# Patient Record
Sex: Female | Born: 1935 | Race: White | Hispanic: No | Marital: Married | Smoking: Never smoker
Health system: Southern US, Community
[De-identification: ages and names within clinical notes are randomized; demographics above are authoritative.]

## PROBLEM LIST (undated history)

## (undated) DIAGNOSIS — I4891 Unspecified atrial fibrillation: Secondary | ICD-10-CM

## (undated) DIAGNOSIS — J9611 Chronic respiratory failure with hypoxia: Secondary | ICD-10-CM

## (undated) DIAGNOSIS — J9612 Chronic respiratory failure with hypercapnia: Secondary | ICD-10-CM

## (undated) DIAGNOSIS — E039 Hypothyroidism, unspecified: Secondary | ICD-10-CM

## (undated) DIAGNOSIS — R059 Cough, unspecified: Secondary | ICD-10-CM

## (undated) DIAGNOSIS — Z8619 Personal history of other infectious and parasitic diseases: Secondary | ICD-10-CM

## (undated) DIAGNOSIS — A498 Other bacterial infections of unspecified site: Secondary | ICD-10-CM

## (undated) DIAGNOSIS — R05 Cough: Secondary | ICD-10-CM

## (undated) DIAGNOSIS — J449 Chronic obstructive pulmonary disease, unspecified: Secondary | ICD-10-CM

## (undated) DIAGNOSIS — K759 Inflammatory liver disease, unspecified: Secondary | ICD-10-CM

## (undated) DIAGNOSIS — IMO0001 Reserved for inherently not codable concepts without codable children: Secondary | ICD-10-CM

## (undated) DIAGNOSIS — I499 Cardiac arrhythmia, unspecified: Secondary | ICD-10-CM

## (undated) HISTORY — DX: Personal history of other infectious and parasitic diseases: Z86.19

## (undated) HISTORY — DX: Unspecified atrial fibrillation: I48.91

## (undated) HISTORY — DX: Other bacterial infections of unspecified site: A49.8

## (undated) HISTORY — PX: SHOULDER ARTHROSCOPY: SHX128

## (undated) HISTORY — PX: COLONOSCOPY: SHX174

## (undated) HISTORY — DX: Chronic obstructive pulmonary disease, unspecified: J44.9

## (undated) HISTORY — DX: Inflammatory liver disease, unspecified: K75.9

---

## 2005-08-02 ENCOUNTER — Encounter: Payer: Self-pay | Admitting: Pulmonary Disease

## 2007-09-11 ENCOUNTER — Encounter: Payer: Self-pay | Admitting: Pulmonary Disease

## 2008-12-07 ENCOUNTER — Encounter: Payer: Self-pay | Admitting: Pulmonary Disease

## 2008-12-24 ENCOUNTER — Encounter: Payer: Self-pay | Admitting: Pulmonary Disease

## 2010-09-02 ENCOUNTER — Encounter: Payer: Self-pay | Admitting: Cardiovascular Disease

## 2010-09-11 ENCOUNTER — Emergency Department: Payer: Self-pay | Admitting: Emergency Medicine

## 2010-09-15 DIAGNOSIS — I4891 Unspecified atrial fibrillation: Secondary | ICD-10-CM

## 2010-09-15 DIAGNOSIS — J42 Unspecified chronic bronchitis: Secondary | ICD-10-CM | POA: Insufficient documentation

## 2010-09-15 DIAGNOSIS — R5383 Other fatigue: Secondary | ICD-10-CM

## 2010-09-15 DIAGNOSIS — J479 Bronchiectasis, uncomplicated: Secondary | ICD-10-CM

## 2010-09-15 DIAGNOSIS — R5381 Other malaise: Secondary | ICD-10-CM

## 2010-09-16 ENCOUNTER — Ambulatory Visit: Payer: Self-pay | Admitting: Pulmonary Disease

## 2010-09-16 DIAGNOSIS — A310 Pulmonary mycobacterial infection: Secondary | ICD-10-CM

## 2010-09-23 ENCOUNTER — Ambulatory Visit: Payer: Self-pay | Admitting: Cardiovascular Disease

## 2010-09-28 ENCOUNTER — Telehealth (INDEPENDENT_AMBULATORY_CARE_PROVIDER_SITE_OTHER): Payer: Self-pay | Admitting: *Deleted

## 2010-10-07 ENCOUNTER — Ambulatory Visit: Payer: Self-pay | Admitting: Pulmonary Disease

## 2010-10-23 ENCOUNTER — Telehealth (INDEPENDENT_AMBULATORY_CARE_PROVIDER_SITE_OTHER): Payer: Self-pay | Admitting: *Deleted

## 2010-12-08 NOTE — Letter (Signed)
Summary: Medical Record Release  Medical Record Release   Imported By: Harlon Flor 09/30/2010 10:59:11  _____________________________________________________________________  External Attachment:    Type:   Image     Comment:   External Document

## 2010-12-08 NOTE — Assessment & Plan Note (Signed)
Summary: NP6/AMD   Visit Type:  Initial Consult Referring Genna Casimir:  Duncan Dull Primary Niklaus Mamaril:  Duncan Dull [facility])  CC:  Establish care with a local Cardiologist; she moved to [place] from [place] in May 2011.  She has A-Fib; has shortness of breath more than usual..  History of Present Illness: Hannah Kline is a very pleasant 77 year old woman with a remote history of Mycobacterium avium with residual lung disease, on inhalers with a history of atrial fibrillation who presents by referral from Dr.Tullo for management of atrial fibrillation.  She reports that she has had 3 episodes of atrial fibrillation. The first episode was 5 years ago when she had an upper respiratory infection/pneumonia requiring a long course of antibiotics. She had a second course of atrial fibrillation in a similar setting when she had an upper respiratory infection. She had a third zone in May of this year lasting for 2 hours. Since then, she had no further episodes.  Notes indicate that her heart rate has been elevated at baseline and she has been started on diltiazem CD 120 mg daily. She is continued on her digoxin, which she has been on for many years. She has not had any complications on the new channel blocker.  She is active, works out at [employer] frequently. She denies any significant shortness of breath, tachycardia palpitations or lightheadedness. She has never had a stress test or cardiac catheterization. She has had an echo with a cardiologist in [place].  EKG shows normal sinus rhythm with rate of 62 beats a minute, incomplete right bundle branch block otherwise no significant ST or T wave changes  Current Medications (verified): 1)  Advair Diskus 100-50 Mcg/dose Aepb (Fluticasone-Salmeterol) .Marland Kitchen.. 1 Puff Two Times A Day 2)  Spiriva Handihaler 18 Mcg Caps (Tiotropium Bromide Monohydrate) .... Once Daily 3)  Levothyroxine Sodium 75 Mcg Tabs (Levothyroxine Sodium) ....  Once Daily 4)  Digoxin 0.125 Mg Tabs (Digoxin) .... Once Daily 5)  Diltiazem Hcl 120 Mg Tabs (Diltiazem Hcl) .... Once Daily 6)  Betoptic-S 0.25 % Susp (Betaxolol Hcl) .... One Drop in Left Eye Once Daily 7)  Dorzolamide Hcl 2 % Soln (Dorzolamide Hcl) .... One Drop in The Left Eye Two Times A Day 8)  Xopenex Hfa 45 Mcg/act Aero (Levalbuterol Tartrate) .Marland Kitchen.. 1-2 Puffs Every 4-6 Hrs As Needed  Allergies (verified): 1)  ! * Avelox 2)  ! * Isoniazid  Past History:  Past Medical History: Last updated: 09/16/2010  ATRIAL FIBRILLATION (ICD-427.31)--paroxysmal H/o MAC C O P D  BRONCHIECTASIS (ICD-494.0) h/o INH hepatitis  Past Surgical History: Last updated: 09/15/2010 Right shoulder arthroscopy  Family History: Last updated: 09/16/2010 mother--intestinal blockage Mother--angina  Social History: Last updated: 09/16/2010 Patient never smoked.  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    Vital Signs:  Patient profile:   77 year old female Height:      68 inches Weight:      143 pounds BMI:     21.82 Pulse rate:   62 / minute BP sitting:   104 / 60  (left arm) Cuff size:   regular  Vitals Entered By: Bishop Dublin, CMA (September 23, 2010 2:33 PM)  Physical Exam  General:  Well developed,  well nourished, in no acute distress.Thin woman. Head:  normocephalic and atraumatic Neck:  Neck supple, no JVD. No masses, thyromegaly or abnormal cervical nodes. Lungs:  Clear bilaterally to auscultation and percussion. Heart:  Non-displaced PMI, chest non-tender; regular rate and rhythm, S1, S2 without murmurs, rubs or gallops. Carotid upstroke  normal, no bruit. Pedals normal pulses. No edema, no varicosities. Abdomen:  Bowel sounds positive; abdomen soft and non-tender without masses Msk:  Back normal, normal gait. Muscle strength and tone normal. Pulses:  pulses normal in all 4 extremities Extremities:  No clubbing or cyanosis. Neurologic:  Alert and oriented x 3. Skin:  Intact without lesions or rashes. Psych:  Normal affect.   Impression & Recommendations:  Problem # 1:  ATRIAL FIBRILLATION (ICD-427.31) history of paroxysmal atrial fibrillation. She does not need anticoagulation as her events are rare. thry Typically comes on with significant stress and did not seem to come on on a routine basis. She seems comfortable with her current medications including digoxin and diltiazem. She would like to keep them as they are. She could possibly hold her digoxin as she preferred to continue on this medication and he should not be any downside to doing so particularly at such a low dose.  If she has recurrent episodes of atrial fibrillation, we could change her to sotalol, or add a beta blocker to her calcium channel blocker.  Her updated medication list for this problem includes:    Digoxin 0.125 Mg Tabs (Digoxin) ..... Once daily  Problem # 2:  OTHER MALAISE AND FATIGUE (ICD-780.79) Her malaise and fatigue which is rare comes on at rest and typically she is able to exercise frequently. I do not think that this is cardiac in nature as she has a excellent exercise tolerance. I've encouraged her to continue her exercise. She has had significant stress over the past year with a recent move from [place].   I did indicate that we could perform a treadmill study though she did not seem to think this was particularly indicated. I think this would be unrevealing given her excellent exercise tolerance.

## 2010-12-08 NOTE — Assessment & Plan Note (Signed)
Summary: rov for review of pfts.   Primary Riko Lumsden/Referring Taniah Reinecke:  Duncan Dull [facility])  CC:  pt here to discuss pft and xray results. pt is never smoker.  History of Present Illness: The pt comes in today for f/u of her pfts.  She was found to have mild to moderate airflow obstruction, minimal restriction, and a normal dlco.  Compared to 2008 pfts from [facility] her airflows have improved.  I have reviewed the study with her in detail, and answered all of her questions.    Current Medications (verified): 1)  Advair Diskus 100-50 Mcg/dose Aepb (Fluticasone-Salmeterol) .Marland Kitchen.. 1 Puff Two Times A Day 2)  Spiriva Handihaler 18 Mcg Caps (Tiotropium Bromide Monohydrate) .... Once Daily 3)  Levothyroxine Sodium 75 Mcg Tabs (Levothyroxine Sodium) .... Once Daily 4)  Digoxin 0.125 Mg Tabs (Digoxin) .... Once Daily 5)  Diltiazem Hcl 120 Mg Tabs (Diltiazem Hcl) .... Once Daily 6)  Betoptic-S 0.25 % Susp (Betaxolol Hcl) .... One Drop in Left Eye Once Daily 7)  Dorzolamide Hcl 2 % Soln (Dorzolamide Hcl) .... One Drop in The Left Eye Two Times A Day 8)  Xopenex Hfa 45 Mcg/act Aero (Levalbuterol Tartrate) .Marland Kitchen.. 1-2 Puffs Every 4-6 Hrs As Needed 9)  Bystolic 5 Mg Tabs (Nebivolol Hcl) .... Once Daily As Needed  Allergies (verified): 1)  ! * Avelox 2)  ! * Isoniazid  Review of Systems       The patient complains of shortness of breath with activity, productive cough, irregular heartbeats, acid heartburn, indigestion, loss of appetite, nasal congestion/difficulty breathing through nose, and change in color of mucus.  The patient denies shortness of breath at rest, non-productive cough, coughing up blood, chest pain, weight change, abdominal pain, difficulty swallowing, sore throat, tooth/dental problems, headaches, sneezing, itching, ear ache, anxiety, depression, hand/feet swelling, joint stiffness or pain, rash, and fever.    Vital Signs:  Patient profile:   77 year old  female Height:      68 inches Weight:      144 pounds O2 Sat:      97 % on Room air Temp:     97.5 degrees F oral Pulse rate:   57 / minute BP sitting:   116 / 62  (left arm) Cuff size:   regular  Vitals Entered By: Carver Fila (October 07, 2010 10:08 AM)  O2 Flow:  Room air CC: pt here to discuss pft and xray results. pt is never smoker Comments meds and allergies updated Phone number updated Carver Fila  October 07, 2010 10:09 AM    Physical Exam  General:  thin female in nad Lungs:  pops, crackles, squeaks throughout no wheezing  Heart:  rrr Extremities:  no edema or cyanosis  Neurologic:  alert and oriented, moves all 4.   Impression & Recommendations:  Problem # 1:  C O P D (ICD-496) the pt has mild to moderate airflow obstruction on her pfts, and will need to stay on some type of bronchodilator regimen.  I suspect this is due to her underlying bronchiectasis, and usually responds to LABA/ICS inhaler regimen.  Will therefore increase her advair to the middle strength.  Spiriva is typically used in emphysema, and therefore would try her off the medication to simplify her treatment.  If she sees worsening, would certainly add back.  Again, I stressed the importance of conditioning.  Problem # 2:  BRONCHIECTASIS (ICD-494.0) Have reviewed the "red flags" with her again, and will culture sputum for bacteria  and afb if she has an acute exacerbation.  Medications Added to Medication List This Visit: 1)  Bystolic 5 Mg Tabs (Nebivolol hcl) .... Once daily as needed  Other Orders: Est. Patient Level III (03474)   Patient Instructions: 1)  trial of increased advair strength to 250/50  one in am and pm..rinse mouth well. 2)  stop spiriva for next 4 weeks as a trial 3)  followup with me in 6mos

## 2010-12-08 NOTE — Progress Notes (Signed)
  Phone Note Other Incoming   Request: Send information Summary of Call: Records received from [facility], 32 pages sent to Dr. Shelle Iron.

## 2010-12-08 NOTE — Miscellaneous (Signed)
Summary: Orders Update pft charges  Clinical Lists Changes  Orders: Added new Service order of Carbon Monoxide diffusing w/capacity (94720) - Signed Added new Service order of Lung Volumes (94240) - Signed Added new Service order of Spirometry (Pre & Post) (94060) - Signed 

## 2010-12-08 NOTE — Assessment & Plan Note (Signed)
Summary: consult for bronchiectasis, copd, h/o mac   Visit Type:  Follow-up Copy to:  Duncan Dull Primary Provider/Referring Provider:  Duncan Dull [facility])  CC:  pulmonary consult. pt c/o cough with yellow mucus and increased sob.  pt being eval for bronchectasis. Marland Kitchen  History of Present Illness: The pt is a 78y.o female who comes in to establish with a pulmonologist in the area.  She recently moved to  [facility] from [place], and carries the diagnosis of bronchiectasis with COPD.  She also has a h/o MAC.  The pt states that she was diagnosed with bronchiectasis approx. 10 yrs ago when she was found to have fibrotic changes and hyperinflation on cxr.  She was later diagnosed with MAC and treated with multi-drug therapy, but developed INH induced hepatitis.  Her treatment was continued with alternative drugs, including nebulized amikacin at one point.  She was treated for at least one year, and thinks she was in some type of study at [facility].  Interestingly, she denies having any constitutional symptoms or debilitating cough at that time.  She thinks her MAC was irradicated by culture, but does not know for sure.  She has been maintained on advair and spiriva for her COPD since 2006.  She has a flutter valve, but rarely uses it.  She has never tried a vibratory vest.  Since moving here from [place], she has not felt well, but thinks it is improving.  She is currently eating well, and is regaining the 10 pound weight loss since the move.  She does have a chronic cough with purulent mucus that occurs every afternoon.  Her mucus quantity is at her usual baseline currently, but she did just finish a course of levaquin for a "chest cold".  She has also seen some increase in DOE recently.  The pt has not had recent pfts or cxr recently.    Current Medications (verified): 1)  Advair Diskus 100-50 Mcg/dose Aepb (Fluticasone-Salmeterol) .Marland Kitchen.. 1 Puff Two Times A Day 2)  Spiriva Handihaler  18 Mcg Caps (Tiotropium Bromide Monohydrate) .... Once Daily 3)  Levothyroxine Sodium 75 Mcg Tabs (Levothyroxine Sodium) .... Once Daily 4)  Digoxin 0.125 Mg Tabs (Digoxin) .... Once Daily 5)  Diltiazem Hcl 120 Mg Tabs (Diltiazem Hcl) .... Once Daily 6)  Betoptic-S 0.25 % Susp (Betaxolol Hcl) .... One Drop in Left Eye Once Daily 7)  Dorzolamide Hcl 2 % Soln (Dorzolamide Hcl) .... One Drop in The Left Eye Two Times A Day 8)  Xopenex Hfa 45 Mcg/act Aero (Levalbuterol Tartrate) .Marland Kitchen.. 1-2 Puffs Every 4-6 Hrs As Needed  Allergies (verified): 1)  ! * Avelox 2)  ! * Isoniazid  Past History:  Past Medical History:  ATRIAL FIBRILLATION (ICD-427.31)--paroxysmal H/o MAC C O P D  BRONCHIECTASIS (ICD-494.0) h/o INH hepatitis  Past Surgical History: Reviewed history from 09/15/2010 and no changes required. Right shoulder arthroscopy  Family History: Reviewed history from 09/15/2010 and no changes required. mother--intestinal blockage Mother--angina  Social History: Reviewed history from 09/15/2010 and no changes required. Patient never smoked.  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 pt c/o cough with yellow mucus, increased sob.  pt being eval for bronchectasis.  Comments meds and allergies updated Phone number updated Carver Fila  September 16, 2010 2:41 PM    Physical Exam  General:  thin female in nad Eyes:  PERRLA and EOMI.   Nose:  patent without discharge or purulence Mouth:  no exudates or other lesions Neck:  no jvd, tmg, LN Lungs:  diffuse crackles and pops no wheezing Heart:  rrr, no  mrg Abdomen:  soft and nontender, bs+ Extremities:  no edema or cyanosis, pulses intact distally Neurologic:  alert and oriented, moves all 4.   Impression & Recommendations:  Problem # 1:  BRONCHIECTASIS (ICD-494.0) the pt has a history of bronchiectasis, and given her abnormal lung exam, I suspect it is quite extensive.  I have had a long discussion with her about the management of bronchiectasis, and have reviewed the "red flags" indicating the need for abx therapy.  I have also encouraged her to use the flutter valve more consistently, especially in the afternoons when she has more cough and congestion.  I have also asked her to call whenever she feels that she is developing an acute flare...Marland KitchenMarland Kitchenit will be key to culture her colonizing flora so that abx therapy can be tailored during infections.  Problem # 2:  C O P D (ICD-496) the pt has a h/o airflow obstruction by pfts per her history.  She is currently on an aggressive bronchodilator regimen, but I wonder if she would do better with a higher strength of ICS?  Will setup for pfts, and discuss a maintenance drug regimen next visit.  I have stressed to her the importance of regular exercise and conditioning.  She has been thru pulmonary rehab once, and may refer her again to the program at [facility].  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  Unclear whether she remains colonized.  She does not currently have failure to thrive symptoms or worsening pulmonary symptoms, and therefore will follow for now.  Will send culture for AFB if we do routine C +S in the future.    Medications Added to Medication List This Visit: 1)  Advair Diskus 100-50 Mcg/dose Aepb (Fluticasone-salmeterol) .Marland Kitchen.. 1 puff two times a day 2)  Diltiazem Hcl 120 Mg Tabs (Diltiazem hcl) .... Once daily 3)  Betoptic-s 0.25 % Susp (Betaxolol hcl) .... One drop in left eye once daily 4)   Dorzolamide Hcl 2 % Soln (Dorzolamide hcl) .... One drop in the left eye two times a day 5)  Xopenex Hfa 45 Mcg/act Aero (Levalbuterol tartrate) .Marland Kitchen.. 1-2 puffs every 4-6 hrs as needed  Other Orders: Consultation Level V (98119) Pulmonary Referral (Pulmonary) T-2 View CXR (71020TC)  Patient Instructions: 1)  will check cxr today, and schedule for breathing tests.  Will see you back to discuss. 2)  no change in current meds. 3)  consider flutter valve in the afternoons to help with mucus clearance.   Immunization History:  Influenza Immunization History:    Influenza:  historical (08/08/2010)  Pneumovax Immunization History:    Pneumovax:  historical (08/08/2010)   Appended Document: consult for bronchiectasis, copd, h/o mac received disk of chest ct from 2010.Marland Kitchen...severe bronchiectasis  involving RUL, LUL, and especially RML/lingula.  Also had scattered nodular densities bilat.

## 2010-12-10 NOTE — Letter (Signed)
SummaryVisual merchandiser Visit Note   [facility]   Imported By: Roderic Ovens 10/28/2010 11:34:13  _____________________________________________________________________  External Attachment:    Type:   Image     Comment:   External Document

## 2010-12-10 NOTE — Progress Notes (Signed)
Summary: rx request  Phone Note Call from Patient Call back at Home Phone [phone]   Caller: Patient Call For: [facility] Summary of Call: pt was given samples of advair that worked well. she requests rx be faxed to [facility] for 90 days supply Initial call taken by: Tivis Ringer, CNA,  October 23, 2010 9:58 AM  Follow-up for Phone Call        Per OV note from 10/07/10, pt was to trial of increased advair strength to 250/50  one in am and pm..rinse mouth well.  Spoke with pt.  States the advair 250 strenght is working well.  Requesting 3 mo supply - [facility].    Dr. Shelle Iron, pls advise if ok.  Thanks! Follow-up by: Gweneth Dimitri RN,  October 23, 2010 10:20 AM  Additional Follow-up for Phone Call Additional follow up Details #1::        ok to send in 250/50 strength Additional Follow-up by: Barbaraann Share MD,  October 23, 2010 5:25 PM    Additional Follow-up for Phone Call Additional follow up Details #2::    Spoke with pt and notified rx was sent to [facility] for advair 250/50. Follow-up by: Vernie Murders,  October 23, 2010 5:31 PM  New/Updated Medications: ADVAIR DISKUS 250-50 MCG/DOSE AEPB (FLUTICASONE-SALMETEROL) 1 puff two times a day, rinse mouth well Prescriptions: ADVAIR DISKUS 250-50 MCG/DOSE AEPB (FLUTICASONE-SALMETEROL) 1 puff two times a day, rinse mouth well  #3 x 3   Entered by:   Vernie Murders   Authorized by:   Barbaraann Share MD   Signed by:   Vernie Murders on 10/23/2010   Method used:   Faxed to ...       [facility] (mail-order)             , [place]         Ph: [phone]       Fax: [phone]   RxID:   [id number]

## 2011-02-01 ENCOUNTER — Encounter: Payer: Self-pay | Admitting: *Deleted

## 2011-02-01 ENCOUNTER — Ambulatory Visit (INDEPENDENT_AMBULATORY_CARE_PROVIDER_SITE_OTHER): Payer: Medicare Other | Admitting: *Deleted

## 2011-02-01 VITALS — BP 106/68 | HR 62 | Ht 68.0 in | Wt 145.8 lb

## 2011-02-01 DIAGNOSIS — I4891 Unspecified atrial fibrillation: Secondary | ICD-10-CM

## 2011-02-01 NOTE — Patient Instructions (Signed)
Continue current medications. Start Pradaxa 150mg  1 tablet in AM and PM. You were given samples today.  Follow up this Wed 02/03/11 @ 3:30pm with Dr. Mariah Milling. We will let you know if you will need to continue Pradaxa Continue Bystolic 5mg  two times a day until your appt. Call our office with any change in symptoms in the meantime.

## 2011-02-01 NOTE — Progress Notes (Signed)
Pt in today for EKG, she is currently in a-fib with HR 62. Pt has h/o paroxysmal a fib. She thinks this began last night, she has no other pertinent symptoms, c/o mild SOB but she has pulmonary dz as well. Dr. Mariah Milling reviewed EKG. Reviewed meds with pt, she is not currently anticoagulated. Pt has taken Bystolic 5mg  today only, she takes PRN for a fib and she states this usually converts her back to NSR. Pt has taken 2 today with no change.  Scheduled pt for f/u with Dr. Mariah Milling in 2 days.  Advised pt to continue Cardizem 120mg  qd, Digoxin .125 qd, and other medications and to continue Bystolic 5mg  bid. Gave pt samples of Pradaxa 150mg  and she will take bid until f/u appt with Dr. Mariah Milling.

## 2011-02-03 ENCOUNTER — Encounter: Payer: Self-pay | Admitting: Cardiovascular Disease

## 2011-02-03 ENCOUNTER — Ambulatory Visit (INDEPENDENT_AMBULATORY_CARE_PROVIDER_SITE_OTHER): Payer: Medicare Other | Admitting: Cardiovascular Disease

## 2011-02-03 DIAGNOSIS — I4891 Unspecified atrial fibrillation: Secondary | ICD-10-CM

## 2011-02-03 DIAGNOSIS — R5381 Other malaise: Secondary | ICD-10-CM

## 2011-02-03 DIAGNOSIS — J479 Bronchiectasis, uncomplicated: Secondary | ICD-10-CM

## 2011-02-03 MED ORDER — AMIODARONE HCL 400 MG PO TABS: 400.0000 mg | ORAL_TABLET | Freq: Two times a day (BID) | ORAL | Status: DC

## 2011-02-03 NOTE — Assessment & Plan Note (Signed)
She does report a chronic cough and mild sputum production. This has been ongoing for several months. I suggested she try a proton pump inhibitor in case this is chronic reflux.

## 2011-02-03 NOTE — Patient Instructions (Signed)
Rhythm shows atrial fibrillation today We will hold diltiazem for now, Start amiodarone 400 mg in the Am and PM Monitor your rhythm if possible. If you think you are in a regular rhythm on Friday, come in for EKG If still in atrial fib on Monday, come in for EKG and to discuss other options (Cardioversion?)

## 2011-02-03 NOTE — Progress Notes (Signed)
   Patient ID: Hannah Kline, female    DOB: 01-31-1932, 77 y.o.   MRN: [id number]  HPI Comments: Hannah Kline is a very pleasant 77 year old woman with a remote history of Mycobacterium avium with residual lung disease, on inhalers with a history of atrial fibrillation who presents for add on appointment with feelings of malaise, recurrence of her atrial fibrillation that started 2 days ago.  She had an EKG in the office and was noted to be in atrial fibrillation. She was started on pradaxa 150 mg b.i.d. At that time. This was a nurse visit. Today she reports that she feels mild malaise, phlegm and shortness of breath her heart rate is not elevated. It is irregular and she has taken extra beta blocker though this did not convert her back to regular rhythm.  She is active, works out at [employer] frequently.  Her last echocardiogram was approximately 5 years ago in [place].  EKG shows atrial fibrillation with ventricular rate 77 beats per minute, left axis deviation, incomplete right bundle branch block     Review of Systems  Constitutional: Negative.        Malaise  HENT: Negative.   Eyes: Negative.   Respiratory: Negative.   Cardiovascular: Negative.        Palpitations  Gastrointestinal: Negative.   Musculoskeletal: Negative.   Skin: Negative.   Neurological: Negative.   Hematological: Negative.   Psychiatric/Behavioral: Negative.   All other systems reviewed and are negative.    BP 118/70  Pulse 77  Ht 5\' 8"  (1.727 m)  Wt 145 lb 6.4 oz (65.953 kg)  BMI 22.11 kg/m2   Physical Exam  Nursing note and vitals reviewed. Constitutional: She is oriented to person, place, and time. She appears well-developed and well-nourished.  HENT:  Head: Normocephalic.  Nose: Nose normal.  Mouth/Throat: Oropharynx is clear and moist.  Eyes: Conjunctivae are normal. Pupils are equal, round, and reactive to light.  Neck: Normal range of motion. Neck supple. No JVD present.  Cardiovascular:  Normal rate, normal heart sounds and intact distal pulses.  An irregularly irregular rhythm present. Exam reveals no gallop and no friction rub.   No murmur heard. Pulmonary/Chest: Effort normal and breath sounds normal. No respiratory distress. She has no wheezes. She has no rales. She exhibits no tenderness.  Abdominal: Soft. Bowel sounds are normal. She exhibits no distension. There is no tenderness.  Musculoskeletal: Normal range of motion. She exhibits no edema and no tenderness.  Lymphadenopathy:    She has no cervical adenopathy.  Neurological: She is alert and oriented to person, place, and time. Coordination normal.  Skin: Skin is warm and dry. No rash noted. No erythema.  Psychiatric: She has a normal mood and affect. Her behavior is normal. Judgment and thought content normal.     Assessment and Plan

## 2011-02-03 NOTE — Assessment & Plan Note (Addendum)
She is in atrial fibrillation for the past 48 hours. She has been on anticoagulation for this time. We will start her on amiodarone 400 mg b.i.d. For several days in the hope that this will cardiovert her. If she does not convert to sinus rhythm in the next 3-5 days, we will discuss with her whether she would like cardioversion. She received back in the office in 5 days or earlier if she feels she has converted to sinus rhythm.  We will hold her diltiazem will restart amiodarone.

## 2011-02-08 ENCOUNTER — Ambulatory Visit (INDEPENDENT_AMBULATORY_CARE_PROVIDER_SITE_OTHER): Payer: Medicare Other | Admitting: *Deleted

## 2011-02-08 ENCOUNTER — Encounter: Payer: Self-pay | Admitting: *Deleted

## 2011-02-08 VITALS — BP 105/64 | HR 58 | Ht 69.0 in | Wt 140.0 lb

## 2011-02-08 DIAGNOSIS — I4891 Unspecified atrial fibrillation: Secondary | ICD-10-CM

## 2011-02-08 NOTE — Progress Notes (Signed)
Pt in today for EKG, she is back in NSR HR 58. Pt in afib Wednesday last week and was told to come in office if she feels she has converted after holding Diltiazem and starting Amiodarone 400mg  bid. Pt stopped the Amiodarone Saturday, she thinks this is when she converted. She has restarted her Diltiazem. Pt has no c/o at this time. She has a f/u with Dr. Mariah Milling 09/2011, pt will call our office with any changes in the meantime. Pt would like to know if she could possibly come off the Diltiazem in the future. In the past she was on Digoxin only and Dr. Darrick Huntsman started her on Dilt as well to hopefully work with the Digoxin to keep her out of a fib. Pt is ok if Dr. Mariah Milling recommends she continues both, she just wanted to know what he advised.

## 2011-02-11 NOTE — Progress Notes (Signed)
Attempted to contact pt, LMOM TCB.  

## 2011-02-11 NOTE — Progress Notes (Signed)
Pt notified [place] [place]

## 2011-03-30 ENCOUNTER — Encounter: Payer: Self-pay | Admitting: Pulmonary Disease

## 2011-04-08 ENCOUNTER — Encounter: Payer: Self-pay | Admitting: Pulmonary Disease

## 2011-04-08 ENCOUNTER — Ambulatory Visit (INDEPENDENT_AMBULATORY_CARE_PROVIDER_SITE_OTHER): Payer: Medicare Other | Admitting: Pulmonary Disease

## 2011-04-08 DIAGNOSIS — J479 Bronchiectasis, uncomplicated: Secondary | ICD-10-CM

## 2011-04-08 DIAGNOSIS — A310 Pulmonary mycobacterial infection: Secondary | ICD-10-CM

## 2011-04-08 DIAGNOSIS — J449 Chronic obstructive pulmonary disease, unspecified: Secondary | ICD-10-CM

## 2011-04-08 NOTE — Assessment & Plan Note (Signed)
The pt has not had any acute exacerbation since the last visit, and has minimal cough and mucus at this time.  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

## 2011-04-08 NOTE — Assessment & Plan Note (Signed)
The pt has done very well on advair alone, and has stopped spiriva.  I have asked her to continue with this, and to stay as active as possible.

## 2011-04-08 NOTE — Patient Instructions (Signed)
No change in inhaler medications Continue to stay as active as possible, and call if worsening pulmonary symptoms. followup with me in 6mos

## 2011-04-08 NOTE — Progress Notes (Signed)
  Subjective:    Patient ID: Hannah Kline, female    DOB: Jan 12, 1932, 77 y.o.   MRN: [id number]  HPI The pt comes in today for f/u of her known bronchiectasis with COPD.  She has done fairly well since last visit from a pulmonary standpoint, and denies recent acute exacerbation or worsening sob.  She has been able to maintain on the mid strength of advair without spiriva.  She has some cough and mucus at baseline, but no increase in quantity or change in character.  She does feel fatigue, but is no losing weight and her appetite is unchanged.   Review of Systems  Constitutional: Negative for fever and unexpected weight change.  HENT: Positive for congestion, rhinorrhea and postnasal drip. Negative for ear pain, nosebleeds, sore throat, sneezing, trouble swallowing, dental problem and sinus pressure.   Eyes: Negative for redness and itching.  Respiratory: Positive for cough and shortness of breath. Negative for chest tightness and wheezing.   Cardiovascular: Negative for palpitations and leg swelling.  Gastrointestinal: Negative for nausea and vomiting.  Genitourinary: Negative for dysuria.  Musculoskeletal: Negative for joint swelling.  Skin: Negative for rash.  Neurological: Negative for headaches.  Hematological: Bruises/bleeds easily.  Psychiatric/Behavioral: Negative for dysphoric mood. The patient is not nervous/anxious.        Objective:   Physical Exam Thin female in nad Chest with scattered crackles and rhonchi.  No wheezing Cor with rrr currently LE without edema, no cyanosis  Alert and oriented, moves all 4        Assessment & Plan:

## 2011-06-23 ENCOUNTER — Telehealth: Payer: Self-pay | Admitting: Pulmonary Disease

## 2011-06-23 DIAGNOSIS — J479 Bronchiectasis, uncomplicated: Secondary | ICD-10-CM

## 2011-06-23 DIAGNOSIS — A310 Pulmonary mycobacterial infection: Secondary | ICD-10-CM

## 2011-06-23 DIAGNOSIS — J449 Chronic obstructive pulmonary disease, unspecified: Secondary | ICD-10-CM

## 2011-06-23 NOTE — Telephone Encounter (Signed)
[email or web address].Carollyn Etcheverry, CMA  

## 2011-06-23 NOTE — Telephone Encounter (Signed)
Spoke with the patient and she states she has been having increased productive cough x 1 month with yellow phlegm, but over the last few days she states the phlegm has been pink. She states the last time this happened was when she was diagnosed with MAC and she wants to know can she come and leave a sputum culture to be tested. She denied any other symptoms except for fatigue and overall not feeling well. Please advise Hannah Kline, CMA

## 2011-06-23 NOTE — Telephone Encounter (Signed)
I advised pt of KC res, orders placed and specimen cup left at front desk for pt to pick up.

## 2011-06-23 NOTE — Telephone Encounter (Signed)
Ok for her to leave sputum  Send for routine c and s, afb smear and culture.

## 2011-06-25 ENCOUNTER — Other Ambulatory Visit: Payer: Medicare Other

## 2011-06-25 DIAGNOSIS — A310 Pulmonary mycobacterial infection: Secondary | ICD-10-CM

## 2011-06-25 DIAGNOSIS — J449 Chronic obstructive pulmonary disease, unspecified: Secondary | ICD-10-CM

## 2011-06-25 DIAGNOSIS — J479 Bronchiectasis, uncomplicated: Secondary | ICD-10-CM

## 2011-06-29 LAB — RESPIRATORY CULTURE OR RESPIRATORY AND SPUTUM CULTURE

## 2011-07-01 ENCOUNTER — Telehealth: Payer: Self-pay | Admitting: Pulmonary Disease

## 2011-07-01 NOTE — Telephone Encounter (Signed)
Spoke with pt. She states that nothing further needed. Has already discussed these results with [facility] and has no further questions.

## 2011-08-18 ENCOUNTER — Ambulatory Visit: Payer: Medicare Other | Admitting: Internal Medicine

## 2011-08-23 ENCOUNTER — Telehealth: Payer: Self-pay | Admitting: Pulmonary Disease

## 2011-08-23 NOTE — Telephone Encounter (Signed)
I spoke with pt and she states when she spoke with megan back in September she advised her that her sputum was not growing mac but it could take up to 6 weeks to get final results. Pt states it has been 6 weeks and is wanting these results. Pt states her symptoms are getting better but still having some cough. Please advise Dr. Shelle Iron, thanks  Carver Fila, CMA

## 2011-08-25 NOTE — Telephone Encounter (Signed)
Please let pt know that her culture was finalized and was neg. For MAC

## 2011-08-26 NOTE — Telephone Encounter (Signed)
lmtcb

## 2011-08-26 NOTE — Telephone Encounter (Signed)
Called and spoke with pt and she stated that she already knew that the culture was negative for MAC.  She stated that they told her this before.  She stated that this was a waste of time.  She stated that she still feels bad, weight loss, cough.  She has appt with KC on 11-30 and i explained to the pt if she feels she needs to be seen prior to her appt on 11-30 then we could change the appt. She stated that she did not want to change her appt that she will keep in on 11-30.  Explained to the pt that if she feels like she is getting worse to call and make an earlier appt.  Pt voiced her understanding.

## 2011-09-24 ENCOUNTER — Encounter: Payer: Self-pay | Admitting: Cardiovascular Disease

## 2011-10-04 ENCOUNTER — Ambulatory Visit (INDEPENDENT_AMBULATORY_CARE_PROVIDER_SITE_OTHER): Payer: Medicare Other | Admitting: Internal Medicine

## 2011-10-04 ENCOUNTER — Encounter: Payer: Self-pay | Admitting: Internal Medicine

## 2011-10-04 DIAGNOSIS — Z1211 Encounter for screening for malignant neoplasm of colon: Secondary | ICD-10-CM

## 2011-10-04 DIAGNOSIS — R0902 Hypoxemia: Secondary | ICD-10-CM

## 2011-10-04 DIAGNOSIS — B965 Pseudomonas (aeruginosa) (mallei) (pseudomallei) as the cause of diseases classified elsewhere: Secondary | ICD-10-CM

## 2011-10-04 DIAGNOSIS — A498 Other bacterial infections of unspecified site: Secondary | ICD-10-CM

## 2011-10-04 DIAGNOSIS — J449 Chronic obstructive pulmonary disease, unspecified: Secondary | ICD-10-CM

## 2011-10-04 DIAGNOSIS — I4891 Unspecified atrial fibrillation: Secondary | ICD-10-CM

## 2011-10-04 DIAGNOSIS — R5383 Other fatigue: Secondary | ICD-10-CM

## 2011-10-04 DIAGNOSIS — Z1239 Encounter for other screening for malignant neoplasm of breast: Secondary | ICD-10-CM

## 2011-10-04 DIAGNOSIS — R5381 Other malaise: Secondary | ICD-10-CM

## 2011-10-04 LAB — CBC WITH DIFFERENTIAL/PLATELET
Basophils Absolute: 0.1 10*3/uL (ref 0.0–0.1)
Basophils Relative: 0.5 % (ref 0.0–3.0)
Eosinophils Relative: 1.4 % (ref 0.0–5.0)
HCT: 39.1 % (ref 36.0–46.0)
Hemoglobin: 13.1 g/dL (ref 12.0–15.0)
Lymphocytes Relative: 12.3 % (ref 12.0–46.0)
Monocytes Relative: 7.8 % (ref 3.0–12.0)
Neutro Abs: 10.2 10*3/uL — ABNORMAL HIGH (ref 1.4–7.7)
RBC: 4.12 Mil/uL (ref 3.87–5.11)
RDW: 15.2 % — ABNORMAL HIGH (ref 11.5–14.6)
WBC: 13 10*3/uL — ABNORMAL HIGH (ref 4.5–10.5)

## 2011-10-04 MED ORDER — LEVOTHYROXINE SODIUM 75 MCG PO TABS: 75.0000 ug | ORAL_TABLET | Freq: Every day | ORAL | Status: DC

## 2011-10-04 NOTE — Progress Notes (Signed)
Subjective:    Patient ID: Hannah Kline, female    DOB: 19-Apr-1932, 77 y.o.   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 Shelle Iron, who presens with continued fatigue, exercise intolerance and weight loss.  She was last seen by me in April 2012 at which time serologies for thyroid disease were sent.  Ambulatory sats at that time were noted to drop to 92% in office and she was referred to Dr. Shelle Iron. Digoxin was also stopped, cardizem was tried at prior visit for elevated heart rate with ambulation but she did not tolerate it was given prn propranolol. She had  Sputum cultures done last month, which were negative for MAC but positive for Pseudomonas which was interpreted as colonization  and therefore not treated after phone discussion with patient (see Dr. Teddy Spike telephone notes, although today this discussion was not recalled spontaneously by patient but recalled once reviewed with patient.  She continuess to go to her exercise class several days per week and notes that she is panting through the class and unable to tolerate much activity at home afterward for the rest of the day due to feeling shaky. She coughs daily and has reported sputum that ranges from clear to pink to orangish/brown lately.  She denies fevers and chest pain and palpitations.  She returned last week from a trip to [place]  And noted that on guided walk she was monitored by the guide with apulse oximetry which could not pick up her fingertip saturations despite working on others in the group.  She does not recall having a six minute walk or full  PFTs in the past year. Her fatigue is limiting her daily activities and she also reports weight loss of 9 lbs due to decreased appetite.  She denies any changes in bowel habits.    Past Medical History  Diagnosis Date  . Atrial fibrillation   .  History of Mycobacterium avium complex infection     lung disease  . COPD (chronic obstructive pulmonary disease)    . Bronchiectasis   . Hepatitis     h/o INH   Current Outpatient Prescriptions on File Prior to Visit  Medication Sig Dispense Refill  . betaxolol (BETOPTIC-S) 0.25 % ophthalmic suspension 1 drop 2 (two) times daily.        . dorzolamide (TRUSOPT) 2 % ophthalmic solution Place 1 drop into the left eye 2 (two) times daily.        . Fluticasone-Salmeterol (ADVAIR DISKUS) 250-50 MCG/DOSE AEPB Inhale 1 puff into the lungs 2 (two) times daily.        Marland Kitchen levalbuterol (XOPENEX HFA) 45 MCG/ACT inhaler Inhale 2 puffs into the lungs every 4 (four) hours as needed.        . nebivolol (BYSTOLIC) 5 MG tablet Take 5 mg by mouth daily as needed.          Review of Systems  Constitutional: Positive for activity change, appetite change, fatigue and unexpected weight change. Negative for fever and chills.  HENT: Negative for hearing loss, ear pain, nosebleeds, congestion, sore throat, facial swelling, rhinorrhea, sneezing, mouth sores, trouble swallowing, neck pain, neck stiffness, voice change, postnasal drip, sinus pressure, tinnitus and ear discharge.   Eyes: Negative for pain, discharge, redness and visual disturbance.  Respiratory: Positive for shortness of breath. Negative for cough, chest tightness, wheezing and stridor.  Cardiovascular: Negative for chest pain, palpitations and leg swelling.  Musculoskeletal: Negative for myalgias and arthralgias.  Skin: Negative for color change and rash.  Neurological: Positive for weakness and light-headedness. Negative for dizziness and headaches.  Hematological: Negative for adenopathy.       Objective:   Physical Exam  Vitals reviewed. Constitutional: She is oriented to person, place, and time. Vital signs are normal. She appears well-developed and well-nourished.  HENT:  Mouth/Throat: Oropharynx is clear and moist.  Eyes: EOM are normal.  Pupils are equal, round, and reactive to light. No scleral icterus.  Neck: Normal range of motion. Neck supple. No JVD present. No thyromegaly present.  Cardiovascular: Normal rate, regular rhythm, normal heart sounds and intact distal pulses.   Pulmonary/Chest: Effort normal. She has rhonchi in the right upper field.  Abdominal: Soft. Bowel sounds are normal. She exhibits no mass. There is no tenderness.  Musculoskeletal: Normal range of motion. She exhibits no edema.  Lymphadenopathy:    She has no cervical adenopathy.  Neurological: She is alert and oriented to person, place, and time.  Skin: Skin is warm and dry.  Psychiatric: She has a normal mood and affect.          Assessment & Plan:

## 2011-10-04 NOTE — Patient Instructions (Signed)
i  recommend adding one daily bottle of Ensure, or Boost or your protein mix if it proves  a minimum of 300 calories to add to your daily meals in between lunch and dinner ,  You may increase to two daily.   Your exercise class may be causing your oxygen level to drop below 885 and this will make you feel exhausted.

## 2011-10-05 ENCOUNTER — Encounter: Payer: Self-pay | Admitting: Internal Medicine

## 2011-10-05 DIAGNOSIS — Z1239 Encounter for other screening for malignant neoplasm of breast: Secondary | ICD-10-CM | POA: Insufficient documentation

## 2011-10-05 DIAGNOSIS — A498 Other bacterial infections of unspecified site: Secondary | ICD-10-CM | POA: Insufficient documentation

## 2011-10-05 DIAGNOSIS — Z1211 Encounter for screening for malignant neoplasm of colon: Secondary | ICD-10-CM | POA: Insufficient documentation

## 2011-10-05 DIAGNOSIS — R5383 Other fatigue: Secondary | ICD-10-CM | POA: Insufficient documentation

## 2011-10-05 NOTE — Assessment & Plan Note (Addendum)
Will screen for anemiaand random cortisol level  today as this was not done in April, but I suspect the source of her fatigue is diminshed lung function and desaturations with activity.  She has an appt with Dr. Shelle Iron within the week and I have recommended that she have her 6 minute walk,  Full pFTS, and overnight sleep study done to assess indications for supplemental 02.  If these are normal I will send her back to [facility] for an stress myoview, as I do not think she could tolerate a treadmill test .

## 2011-10-05 NOTE — Assessment & Plan Note (Signed)
Breast exam was deferred as was her annal PE which was due now ,  Due to her very active concerns over continued weight loss and fatigue.  Will reschedule in one month.

## 2011-10-05 NOTE — Assessment & Plan Note (Addendum)
She has had no runs of A fib that she is aware of and rhythm was normal in office.  Continue prn propranolol since she did not tolerate cardizem.  If her pulmonoogy evaluation fails to provide a source for her fatigue and weight loss will schedule eval by Tinm gollan with Holter monitor and stress test.

## 2011-10-05 NOTE — Assessment & Plan Note (Signed)
Her sputum culture from last month was treated as colonization.  Since her symptoms have been present for over 6 mo,ths without improvement and she is seeing Dr. Shelle Iron in less than one week we discussed the option of  treating her for actual infection.  If she has a leukocytosis I will go aheada and treat; otherwise will defer to Dr. Shelle Iron.

## 2011-10-05 NOTE — Assessment & Plan Note (Signed)
She had a normal colonoscopy in 2007 and will compete annual FOBTS until 2017 starting now.

## 2011-10-06 ENCOUNTER — Telehealth: Payer: Self-pay | Admitting: Internal Medicine

## 2011-10-06 DIAGNOSIS — A498 Other bacterial infections of unspecified site: Secondary | ICD-10-CM

## 2011-10-06 MED ORDER — CIPROFLOXACIN HCL 750 MG PO TABS: 750.0000 mg | ORAL_TABLET | Freq: Two times a day (BID) | ORAL | Status: AC

## 2011-10-06 NOTE — Telephone Encounter (Signed)
Message copied by Duncan Dull on Wed Oct 06, 2011  5:26 PM ------      Message from: Almira      Created: Tue Oct 05, 2011  6:40 PM       We can check her ambulatory oximetry while here.  Regarding treatment with abx, we usually use 3 "red flags" to pull trigger on abx, understanding that we can never sterilize her secretions.  Fever, increased quantity of mucus above usual baseline, and worsening pulmonary symptoms.  If you think she meets any of these criteria, would treat with abx (prob cipro at 750mg ).              ----- Message -----         From: Duncan Dull, MD         Sent: 10/05/2011  12:52 PM           To: Barbaraann Share, MD            Mellody Dance,            I saw Ms. Mcgue yesterday , cc persistent fatigue,  Productive sputum.  See my note, but since her cbc did not show anemia but did have a leukocytosis do you agree with starting her on antibiotics for the pseudomonas you cultured from her sputum. In September? . She has an appt coming up with you and I think she is also desatting with activity as the cause for her fatigue.  Do you guys still do 6 minute walks ?

## 2011-10-06 NOTE — Telephone Encounter (Signed)
Dr. Shelle Iron and I discussed the pseudomonas in her sputum and the fact that since she is having increased sputum and worsening dyspnea, I have elected to go ahead and treat her as if the psedumonas is causing an infection. I will send update EPIC with a presciption for ciprofloxacin 750 mg twice daily for 2 weeks #28 .  Can you send to her local pharmacy?  Marland Kitchen

## 2011-10-07 ENCOUNTER — Ambulatory Visit: Payer: Medicare Other | Admitting: Cardiovascular Disease

## 2011-10-07 NOTE — Telephone Encounter (Signed)
Notified patient of the message, and Rx has been called in.

## 2011-10-08 ENCOUNTER — Ambulatory Visit: Payer: Medicare Other | Admitting: Pulmonary Disease

## 2011-10-11 ENCOUNTER — Encounter: Payer: Self-pay | Admitting: Cardiovascular Disease

## 2011-10-12 ENCOUNTER — Ambulatory Visit (INDEPENDENT_AMBULATORY_CARE_PROVIDER_SITE_OTHER): Payer: Medicare Other | Admitting: Cardiovascular Disease

## 2011-10-12 ENCOUNTER — Encounter: Payer: Self-pay | Admitting: Cardiovascular Disease

## 2011-10-12 VITALS — BP 100/60 | HR 68 | Ht 68.0 in | Wt 135.5 lb

## 2011-10-12 DIAGNOSIS — R5383 Other fatigue: Secondary | ICD-10-CM

## 2011-10-12 DIAGNOSIS — A498 Other bacterial infections of unspecified site: Secondary | ICD-10-CM

## 2011-10-12 DIAGNOSIS — B965 Pseudomonas (aeruginosa) (mallei) (pseudomallei) as the cause of diseases classified elsewhere: Secondary | ICD-10-CM

## 2011-10-12 DIAGNOSIS — R5381 Other malaise: Secondary | ICD-10-CM

## 2011-10-12 DIAGNOSIS — I4891 Unspecified atrial fibrillation: Secondary | ICD-10-CM

## 2011-10-12 NOTE — Assessment & Plan Note (Signed)
No recent episodes of atrial fibrillation. She does have amiodarone to take p.r.n. For paroxysmal atrial fibrillation. We have asked her to call us if she does have arrhythmia.

## 2011-10-12 NOTE — Progress Notes (Signed)
Patient ID: Hannah Kline, female    DOB: [birth year], 77 y.o.   MRN: [id number]  HPI Comments: Hannah Kline is a very pleasant 77 year old woman with a remote history of Mycobacterium avium with residual lung disease/Bronchiectasis, on inhalers, with a history of atrial fibrillation (Last episode at the end of March with conversion to normal sinus rhythm after amiodarone p.r.n.), who presents for Routine followup.  She reports that she has had no further atrial fibrillation since the end of March 2012.  She does have worsening shortness of breath since that time. She has malaise, has lost 10 pounds.  She has a sputum culture growing Pseudomonas from August 2012.  She was recently started on ciprofloxacin 750 mg b.i.d. For symptoms of cough and malaise. Pseudomonas is pan sensitive.  She has noticed hypoxia when working out with oxygen saturation sometimes to the low 80s. She is concerned about this though does not want oxygen. Her last echocardiogram was approximately 5 years ago in [place].  EKG shows Normal sinus rhythm with rate 69 beats per minute, incomplete right bundle branch block, no other significant ST or T wave changes   Outpatient Encounter Prescriptions as of 10/12/2011  Medication Sig Dispense Refill  . betaxolol (BETOPTIC-S) 0.25 % ophthalmic suspension 1 drop 2 (two) times daily.        . ciprofloxacin (CIPRO) 750 MG tablet Take 1 tablet (750 mg total) by mouth 2 (two) times daily.  28 tablet  0  . dorzolamide (TRUSOPT) 2 % ophthalmic solution Place 1 drop into the left eye 2 (two) times daily.        . Fluticasone-Salmeterol (ADVAIR DISKUS) 250-50 MCG/DOSE AEPB Inhale 1 puff into the lungs 2 (two) times daily.        Marland Kitchen levalbuterol (XOPENEX HFA) 45 MCG/ACT inhaler Inhale 2 puffs into the lungs every 4 (four) hours as needed.        Marland Kitchen levothyroxine (SYNTHROID, LEVOTHROID) 75 MCG tablet Take 1 tablet (75 mcg total) by mouth daily.  90 tablet  3  . nebivolol (BYSTOLIC) 5 MG tablet  Take 5 mg by mouth daily as needed.  (NOT TAKING)         Review of Systems  Constitutional: Positive for fatigue.       Malaise  HENT: Negative.   Eyes: Negative.   Respiratory: Positive for shortness of breath.   Cardiovascular: Negative.        Palpitations  Gastrointestinal: Negative.   Musculoskeletal: Negative.   Skin: Negative.   Neurological: Negative.   Hematological: Negative.   Psychiatric/Behavioral: Negative.   All other systems reviewed and are negative.    BP 100/60  Pulse 68  Ht 5\' 8"  (1.727 m)  Wt 135 lb 8 oz (61.462 kg)  BMI 20.60 kg/m2   Physical Exam  Nursing note and vitals reviewed. Constitutional: She is oriented to person, place, and time. She appears well-developed and well-nourished.  HENT:  Head: Normocephalic.  Nose: Nose normal.  Mouth/Throat: Oropharynx is clear and moist.  Eyes: Conjunctivae are normal. Pupils are equal, round, and reactive to light.  Neck: Normal range of motion. Neck supple. No JVD present.  Cardiovascular: Normal rate, S1 normal, S2 normal, normal heart sounds and intact distal pulses.  An irregularly irregular rhythm present. Exam reveals no gallop and no friction rub.   No murmur heard. Pulmonary/Chest: Effort normal. No respiratory distress. She has wheezes. She has no rales. She exhibits no tenderness.  Abdominal: Soft. Bowel sounds are normal. She exhibits  no distension. There is no tenderness.  Musculoskeletal: Normal range of motion. She exhibits no edema and no tenderness.  Lymphadenopathy:    She has no cervical adenopathy.  Neurological: She is alert and oriented to person, place, and time. Coordination normal.  Skin: Skin is warm and dry. No rash noted. No erythema.  Psychiatric: She has a normal mood and affect. Her behavior is normal. Judgment and thought content normal.         Assessment and Plan     Assessment and Plan

## 2011-10-12 NOTE — Assessment & Plan Note (Signed)
I suspect her fatigue is secondary to underlying Pseudomonas infection.

## 2011-10-12 NOTE — Assessment & Plan Note (Signed)
Recently started on Cipro for Pseudomonas. She has followup with pulmonary in 2 weeks.

## 2011-10-12 NOTE — Patient Instructions (Signed)
You are doing well. No medication changes were made.  Please call us if you have new issues that need to be addressed before your next appt.  The office will contact you for a follow up Appt. In 12 months  

## 2011-10-25 ENCOUNTER — Encounter: Payer: Self-pay | Admitting: Pulmonary Disease

## 2011-10-25 ENCOUNTER — Ambulatory Visit (INDEPENDENT_AMBULATORY_CARE_PROVIDER_SITE_OTHER): Payer: Medicare Other | Admitting: Pulmonary Disease

## 2011-10-25 DIAGNOSIS — J479 Bronchiectasis, uncomplicated: Secondary | ICD-10-CM

## 2011-10-25 DIAGNOSIS — J449 Chronic obstructive pulmonary disease, unspecified: Secondary | ICD-10-CM

## 2011-10-25 DIAGNOSIS — A310 Pulmonary mycobacterial infection: Secondary | ICD-10-CM

## 2011-10-25 MED ORDER — MOMETASONE FURO-FORMOTEROL FUM 100-5 MCG/ACT IN AERO: 2.0000 | INHALATION_SPRAY | Freq: Two times a day (BID) | RESPIRATORY_TRACT | Status: DC

## 2011-10-25 NOTE — Patient Instructions (Addendum)
Will try vibratory vest am and pm to see if helps Will try dulera 100/5  2 inhalations am and pm in the place of advair everyday for one month. Will schedule for walk as well as overnight check of your oxygen levels while sleeping.  Consider using oxygen during your exercise periods to help extend your workouts and make them more productive. Will check ct chest in light of your ongoing mucus issues to see if evidence to suggest this more due to MAC than bacteria. followup with me in 4mos if doing better, but call if having increased issues.

## 2011-10-25 NOTE — Progress Notes (Signed)
  Subjective:    Patient ID: Hannah Kline, female    DOB: 08/20/1932, 77 y.o.   MRN: [id number]  HPI The patient comes in today for followup of her known bronchiectasis with obstructive lung disease.  She has multiple issues today, including a recent change in her mucus where she began to cough up blood-tinged material, and was subsequently treated by her primary physician with a course of ciprofloxacin.  She saw some improvement, but not a lot change.  She denies increased quantity of mucus, or increasing chest congestion.  She does have increased dyspnea on exertion, and is finding it more difficult to participate in her exercise program.  She has been monitoring her oxygen saturations with a pulse oximeter during this time, and states that she desaturates to 83%.  She has been staying compliant with her Advair.  The patient has also had a slow weight loss since the last visit, but denies other constitutional symptoms.  She has not had any fever, chills, or sweats.   Review of Systems  Constitutional: Positive for diaphoresis. Negative for fever and unexpected weight change.  HENT: Positive for congestion, rhinorrhea and postnasal drip. Negative for ear pain, nosebleeds, sore throat, sneezing, trouble swallowing, dental problem and sinus pressure.   Eyes: Negative for redness and itching.  Respiratory: Positive for cough and shortness of breath. Negative for chest tightness and wheezing.   Cardiovascular: Negative for palpitations and leg swelling.  Gastrointestinal: Negative for nausea and vomiting.  Genitourinary: Negative for dysuria.  Musculoskeletal: Negative for joint swelling.  Skin: Negative for rash.  Neurological: Negative for headaches.  Hematological: Bruises/bleeds easily.  Psychiatric/Behavioral: Negative for dysphoric mood. The patient is not nervous/anxious.        Objective:   Physical Exam Thin female in no acute distress  nose without purulence or discharge noted Chest  with diffuse pops and squeaks, no true wheezing Cardiac exam with regular rate and rhythm Lower extremities without edema, no cyanosis noted Alert and oriented, moves all 4 extremities.       Assessment & Plan:

## 2011-10-26 ENCOUNTER — Ambulatory Visit (INDEPENDENT_AMBULATORY_CARE_PROVIDER_SITE_OTHER): Payer: Medicare Other | Admitting: Pulmonary Disease

## 2011-10-26 DIAGNOSIS — R0609 Other forms of dyspnea: Secondary | ICD-10-CM

## 2011-10-26 DIAGNOSIS — R0600 Dyspnea, unspecified: Secondary | ICD-10-CM

## 2011-10-26 NOTE — Assessment & Plan Note (Addendum)
The patient has known mild to moderate airflow obstruction secondary to her bronchiectasis.  She has been on Advair, but has noticed increased shortness of breath just recently, and it is unclear how much of this is related to her exertional desaturations versus air trapping.  We'll try her on dulera instead of the Advair, since this has a smaller particles size and some patients see an improvement with increased deposition.  I have also had a long conversation with her about exertional oxygen.  I have encouraged her to wear oxygen whenever she is doing her exercise program or activities that require moderate or greater exertion.  However, the patient does not want to do this.  I will check a 6 minute walk to document her level of desaturation, and also overnight oximetry since this can involve longer periods of desaturation.  My only other thought is whether there may be a cardiac issue here that may be contributing to her sob??

## 2011-10-26 NOTE — Assessment & Plan Note (Signed)
The patient has a history of MAC and has been treated in the past, but her recent culture in August of this year was negative.  With her persistent blood-tinged mucus, I continued to worry about the presence of MAC.  I would like to repeat her CT chest to see if there is evidence for MAC such as cavities or abscesses.

## 2011-10-26 NOTE — Assessment & Plan Note (Signed)
The patient has known bronchiectasis, and is colonized with Pseudomonas.  She is currently bringing up discolored mucus with blood tinge, and has been treated recently with a course of ciprofloxacin.  She denies fever, increased quantity of mucus over baseline, nor has she had worsening chest congestion.  I have explained to her again that it is near impossible to sterilize her respiratory secretions given her bronchiectasis, and that attempts to do so usually result in resistant organisms.  However, I would treat her aggressively for true acute exacerbations and increased pulmonary symptoms.  The patient will typically work on muco- ciliary clearance in the mornings, and then does well for the rest of the day.  The key for her will be pulmonary hygiene, and I have recommended that she try a vibratory vest.  We'll continue to monitor her closely, and at some point she may require nebulized antibiotics or rotating antibiotics.

## 2011-10-28 ENCOUNTER — Ambulatory Visit (INDEPENDENT_AMBULATORY_CARE_PROVIDER_SITE_OTHER)
Admission: RE | Admit: 2011-10-28 | Discharge: 2011-10-28 | Disposition: A | Discharge status: Home / Self Care | Payer: Medicare Other | Source: Ambulatory Visit | Attending: Pulmonary Disease | Admitting: Pulmonary Disease

## 2011-10-28 DIAGNOSIS — A310 Pulmonary mycobacterial infection: Secondary | ICD-10-CM

## 2011-10-28 DIAGNOSIS — J479 Bronchiectasis, uncomplicated: Secondary | ICD-10-CM

## 2011-10-28 DIAGNOSIS — J449 Chronic obstructive pulmonary disease, unspecified: Secondary | ICD-10-CM

## 2011-10-29 ENCOUNTER — Encounter: Payer: Self-pay | Admitting: Pulmonary Disease

## 2011-11-15 ENCOUNTER — Telehealth: Payer: Self-pay | Admitting: Pulmonary Disease

## 2011-11-15 ENCOUNTER — Encounter: Payer: Self-pay | Admitting: Pulmonary Disease

## 2011-11-15 NOTE — Telephone Encounter (Signed)
Megan, please let pt know that her ONO shows low sat of 85%, but she only spent 3 min the entire night less than 88%.  Therefore, does not need oxygen while sleeping.

## 2011-11-15 NOTE — Telephone Encounter (Signed)
Called and spoke with pt.  Pt aware of ONO results.

## 2011-11-17 ENCOUNTER — Encounter: Payer: Self-pay | Admitting: Pulmonary Disease

## 2011-11-21 ENCOUNTER — Emergency Department: Payer: Self-pay | Admitting: Emergency Medicine

## 2011-11-21 DIAGNOSIS — J479 Bronchiectasis, uncomplicated: Secondary | ICD-10-CM | POA: Diagnosis not present

## 2011-11-21 DIAGNOSIS — R002 Palpitations: Secondary | ICD-10-CM | POA: Diagnosis not present

## 2011-11-21 DIAGNOSIS — I4891 Unspecified atrial fibrillation: Secondary | ICD-10-CM | POA: Diagnosis not present

## 2011-11-21 LAB — COMPREHENSIVE METABOLIC PANEL
Albumin: 3.4 g/dL (ref 3.4–5.0)
Anion Gap: 10 (ref 7–16)
BUN: 13 mg/dL (ref 7–18)
Bilirubin,Total: 0.6 mg/dL (ref 0.2–1.0)
Chloride: 105 mmol/L (ref 98–107)
EGFR (African American): >60
Glucose: 60 mg/dL — ABNORMAL LOW (ref 65–99)
Osmolality: 283 (ref 275–301)
Potassium: 3.6 mmol/L (ref 3.5–5.1)
SGOT(AST): 22 U/L (ref 15–37)
Sodium: 143 mmol/L (ref 136–145)
Total Protein: 7.3 g/dL (ref 6.4–8.2)

## 2011-11-21 LAB — URINALYSIS, COMPLETE
Bacteria: NONE SEEN
Bilirubin,UR: NEGATIVE
Blood: NEGATIVE
Ketone: NEGATIVE
Nitrite: NEGATIVE
Ph: 6 (ref 4.5–8.0)
RBC,UR: 1 /HPF (ref 0–5)
Specific Gravity: 1.006 (ref 1.003–1.030)
Squamous Epithelial: 1
WBC UR: 1 /HPF (ref 0–5)

## 2011-11-21 LAB — CBC
HGB: 13.4 g/dL (ref 12.0–16.0)
MCV: 94 fL (ref 80–100)
Platelet: 244 10*3/uL (ref 150–440)
RBC: 4.35 10*6/uL (ref 3.80–5.20)
RDW: 14.4 % (ref 11.5–14.5)
WBC: 11.9 10*3/uL — ABNORMAL HIGH (ref 3.6–11.0)

## 2011-11-21 LAB — DIGOXIN LEVEL: Digoxin: 0.64 ng/mL

## 2011-11-21 LAB — CK TOTAL AND CKMB (NOT AT ARMC)
CK, Total: 50 U/L (ref 21–215)
CK-MB: 0.7 ng/mL (ref 0.5–3.6)

## 2011-11-21 LAB — MAGNESIUM: Magnesium: 1.7 mg/dL — ABNORMAL LOW

## 2011-11-21 LAB — TSH: Thyroid Stimulating Horm: 3.09 u[IU]/mL

## 2011-11-21 LAB — TROPONIN I: Troponin-I: <0.02 ng/mL

## 2011-11-22 ENCOUNTER — Telehealth: Payer: Self-pay | Admitting: Cardiovascular Disease

## 2011-11-22 ENCOUNTER — Encounter: Payer: Self-pay | Admitting: Cardiovascular Disease

## 2011-11-22 ENCOUNTER — Ambulatory Visit (INDEPENDENT_AMBULATORY_CARE_PROVIDER_SITE_OTHER): Payer: Medicare Other | Admitting: Cardiovascular Disease

## 2011-11-22 VITALS — BP 90/60 | HR 98 | Ht 68.0 in | Wt 136.0 lb

## 2011-11-22 DIAGNOSIS — I4891 Unspecified atrial fibrillation: Secondary | ICD-10-CM

## 2011-11-22 DIAGNOSIS — J479 Bronchiectasis, uncomplicated: Secondary | ICD-10-CM

## 2011-11-22 NOTE — Progress Notes (Signed)
Patient ID: Hannah Kline, female    DOB: [birth year], 78 y.o.   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  She does report that she feels she converted to atrial fibrillation yesterday morning. She did have a stressful day 2 days ago, cooking and cleaning in her house for company. She wonders if she may have overdone it. She felt a tachycardia with irregularity starting yesterday morning. She went to the emergency room and was given doxycycline for possible upper respiratory infection, as was magnesium and oxygen. She started amiodarone 400 mg with pradaxa yesterday evening. She took anticoagulation again this morning.   Blood pressure is low today. She did take a bystolic 5 mg yesterday morning with a digoxin 0.125 mg.  Rare episodes of atrial fibrillation, the last episode was at the end of March 2012.  She has a sputum culture growing Pseudomonas from August 2012.  She did take a course of ciprofloxacin for 2 weeks at this time and felt better though has started to feel worse with sputum production again.  Periods of hypoxia in the past with working out. She does not want oxygen. Her last echocardiogram was approximately 5 years ago in [place].  EKG shows Atrial fibrillation with rate 98 beats per minute, right bundle branch block, no significant ST or T wave changes, left axis deviation   Outpatient Encounter Prescriptions as of 10/12/2011  Medication Sig Dispense Refill  . betaxolol (BETOPTIC-S) 0.25 % ophthalmic suspension 1 drop 2 (two) times daily.        . ciprofloxacin (CIPRO) 750 MG tablet Take 1 tablet (750 mg total) by mouth 2 (two) times daily.  28 tablet  0  .  dorzolamide (TRUSOPT) 2 % ophthalmic solution Place 1 drop into the left eye 2 (two) times daily.        . Fluticasone-Salmeterol (ADVAIR DISKUS) 250-50 MCG/DOSE AEPB Inhale 1 puff into the lungs 2 (two) times daily.        Marland Kitchen levalbuterol (XOPENEX HFA) 45 MCG/ACT inhaler Inhale 2 puffs into the lungs every 4 (four) hours as needed.        Marland Kitchen levothyroxine (SYNTHROID, LEVOTHROID) 75 MCG tablet Take 1 tablet (75 mcg total) by mouth daily.  90 tablet  3   Amiodarone  PRN for atrial fibrillation    . nebivolol (BYSTOLIC) 5 MG tablet Take 5 mg by mouth daily as needed.  (NOT TAKING)         Review of Systems  Constitutional:       Malaise  HENT: Negative.   Eyes: Negative.   Respiratory: Positive for shortness of breath.        Sputum production with cough  Cardiovascular: Positive for palpitations.       Palpitations  Gastrointestinal: Negative.   Musculoskeletal: Negative.   Skin: Negative.   Neurological: Negative.   Hematological: Negative.   Psychiatric/Behavioral: Negative.   All other systems reviewed and are negative.    BP 90/60  Pulse 98  Ht 5\' 8"  (1.727 m)  Wt 136 lb (61.689 kg)  BMI 20.68 kg/m2   Physical Exam  Nursing note and vitals reviewed. Constitutional: She is oriented to person, place, and time. She appears well-developed and well-nourished.  HENT:  Head: Normocephalic.  Nose: Nose normal.  Mouth/Throat: Oropharynx is clear and moist.  Eyes: Conjunctivae are normal. Pupils are equal, round, and reactive to light.  Neck: Normal range of motion. Neck supple. No JVD present.  Cardiovascular: Normal rate, S1 normal, S2 normal, normal heart sounds and intact distal pulses.  An irregularly irregular rhythm present. Exam reveals no gallop and no friction rub.   No murmur heard. Pulmonary/Chest: Effort normal. No respiratory distress. She has no rales. She exhibits no tenderness.  Abdominal: Soft. Bowel sounds are normal. She exhibits no distension. There is no  tenderness.  Musculoskeletal: Normal range of motion. She exhibits no edema and no tenderness.  Lymphadenopathy:    She has no cervical adenopathy.  Neurological: She is alert and oriented to person, place, and time. Coordination normal.  Skin: Skin is warm and dry. No rash noted. No erythema.  Psychiatric: She has a normal mood and affect. Her behavior is normal. Judgment and thought content normal.         Assessment and Plan     Assessment and Plan

## 2011-11-22 NOTE — Patient Instructions (Addendum)
Please continue amiodarone 400 mg twice a day until you convert (take for 4 days) Starting Saturday, decrease amio to 200 mg twice a day  Please continue digoxin (0.25 mg today) then 0.125 daily Continue pradaxa 150 mg twice a day Take bystolic only if systolic blood pressure greater than 110  Come in for an EKG on Monday morning  Please call us if you have new issues that need to be addressed before your next appt.  Your physician wants you to follow-up in: 1 to 2  week  You will receive a reminder letter in the mail two months in advance. If you don't receive a letter, please call our office to schedule the follow-up appointment.

## 2011-11-22 NOTE — Telephone Encounter (Signed)
Pt went [facility] with AFIB. Pt is still in AFIB this morning. They gave her a magnesium drip and has taken all the meds that she is suppose to and still not working.

## 2011-11-22 NOTE — Assessment & Plan Note (Signed)
We will try amiodarone through this week 400 mg b.i.d. With a check of her EKG in several days' time. We'll decrease the amiodarone to 200 mg b.i.d. If she has not converted to normal sinus rhythm by this weekend. We will have close followup with her next week. Given her low blood pressure, we are unable to use beta blockers and calcium channel blockers. We have suggested she continue on her digoxin 0.125 mg daily for rate control.  If she does not convert to normal sinus rhythm, we could consider cardioversion.  If she continues to have frequent episodes of atrial fibrillation, we could consider alternate antiarrhythmic given her underlying lung condition and the potential toxicity from amiodarone.

## 2011-11-22 NOTE — Assessment & Plan Note (Signed)
She reports that she felt well on the ciprofloxacin and 2012 and is wondering whether a long course of suppressive therapy with Cipro might make her feel better. She does have recurrence of her cough with sputum production.

## 2011-11-22 NOTE — Telephone Encounter (Signed)
Spoke to pt, she went to ER yesterday for new episode of Afib w/ RVR. C/o this AM of continued Afib, HR currently ranging from 52-111 at home. S/S are weakness and mild nausea. In ER, was given no meds for a-fib (per pt); only given Mag gtt for low result; and was only sent home with Rx for doxycycline for possible PNA per CXR, pt has not taken. She normally takes Bystolic 5mg  only and Digoxin 0.125mg  PRN and Amiodarone 200mg  PRN and Pradaxa PRN. Yesterday she took 1 digoxin with no changes. Last night AND this AM she took Amiodarone 200mg  x 2 and Pradaxa 150mg . HR still as high as 111. (Pt last ov 10/12/11 with TG, EKG NSR with RBBB, HR 69, h/o afib with conversion with Amio. Had not had an episode since 01/2011. Pt was instructed to call with any new Afib occurences.) I have advised pt to come in today at 1:45 for visit with Dr. Mariah Milling.

## 2011-11-29 ENCOUNTER — Encounter: Payer: Medicare Other | Admitting: *Deleted

## 2011-12-06 ENCOUNTER — Telehealth: Payer: Self-pay | Admitting: Pulmonary Disease

## 2011-12-06 ENCOUNTER — Encounter: Payer: Self-pay | Admitting: Cardiovascular Disease

## 2011-12-06 ENCOUNTER — Ambulatory Visit: Payer: Medicare Other | Admitting: Cardiovascular Disease

## 2011-12-06 MED ORDER — FLUTICASONE-SALMETEROL 250-50 MCG/DOSE IN AEPB: 1.0000 | INHALATION_SPRAY | Freq: Two times a day (BID) | RESPIRATORY_TRACT | Status: DC

## 2011-12-06 NOTE — Telephone Encounter (Signed)
Called spoke with patient, advised of KC's recs.  Pt verbalized her understanding but is still concerned that her mucus is pink, red and orange and feels this is related to infection.  Pt denies any changes in breathing.  Pt stated that she felt better after finishing the cipro.  Pt requesting any recs from [facility].  Also, pt stated that she would prefer changing back to the advair - changed in med list.  *pt okay with call-back tomorrow.

## 2011-12-06 NOTE — Telephone Encounter (Signed)
lmtcb x1 

## 2011-12-06 NOTE — Telephone Encounter (Signed)
Pt returned triage's call & stated she should be home for the rest of the day.  Hannah Kline

## 2011-12-06 NOTE — Telephone Encounter (Signed)
Spoke with and pt and she states has 3 questions for KC 1- since she is using the vest now to help loosen her mucus, would now be a good time to take abx "to go ahead and fight the bacteria" 2- currently using vest twice daily for 30 min each time. Can this be lessened to 20 min at a time?  3- since taking dulera, she has not noticed any changes in her symptoms. Can she go back to advair which is what she was taking before?  Please advise thanks!

## 2011-12-06 NOTE — Telephone Encounter (Signed)
Ok to decrease vest time to Ok to go back to advair, but it does have a higher incidence of cough than the other inhalers.  If she want to switch, ok with me.  Antibiotics are not needed just because you are mobilizing mucus.  She is colonized with bacteria that is always there, and her secretions can never be sterilized.  Attempts to do so will only result in multidrug resistant organisms.

## 2011-12-07 MED ORDER — CIPROFLOXACIN HCL 500 MG PO TABS: 500.0000 mg | ORAL_TABLET | Freq: Two times a day (BID) | ORAL | Status: AC

## 2011-12-07 NOTE — Telephone Encounter (Signed)
Let pt know that I think the downside of chronic antibiotics outweigh the benefits at this point.  Just because her mucus is discolored, doesn't mean that it is infected.  It probably is COLONIZATION with bugs.  Pt's with bronchiectasis often cough up mucus that is purulent everyday!!!  That said, if she feels strongly that she wishes to try antibiotics, could treat with cipro the first 7 days of every month.  She would need to accept the risk of developing multidrug resistant organisms that may not respond to traditional antibiotics in the future.  It is her decision.  If she wishes to:  cipro 500mg  bid first 7 days of each month.  #14, 6 fills.

## 2011-12-07 NOTE — Telephone Encounter (Signed)
Patient aware of KC recs but would like to try the Cipro for several months and see if this helps. She will call if she has any further questions or concerns.

## 2011-12-08 ENCOUNTER — Encounter: Payer: Self-pay | Admitting: Cardiovascular Disease

## 2011-12-08 ENCOUNTER — Telehealth: Payer: Self-pay | Admitting: *Deleted

## 2011-12-08 ENCOUNTER — Emergency Department: Payer: Self-pay | Admitting: Emergency Medicine

## 2011-12-08 DIAGNOSIS — R079 Chest pain, unspecified: Secondary | ICD-10-CM | POA: Diagnosis not present

## 2011-12-08 DIAGNOSIS — Z79899 Other long term (current) drug therapy: Secondary | ICD-10-CM | POA: Diagnosis not present

## 2011-12-08 DIAGNOSIS — I4891 Unspecified atrial fibrillation: Secondary | ICD-10-CM | POA: Diagnosis not present

## 2011-12-08 DIAGNOSIS — R52 Pain, unspecified: Secondary | ICD-10-CM | POA: Diagnosis not present

## 2011-12-08 DIAGNOSIS — J449 Chronic obstructive pulmonary disease, unspecified: Secondary | ICD-10-CM | POA: Diagnosis not present

## 2011-12-08 DIAGNOSIS — R6889 Other general symptoms and signs: Secondary | ICD-10-CM | POA: Diagnosis not present

## 2011-12-08 LAB — TROPONIN I: Troponin-I: <0.02 ng/mL

## 2011-12-08 LAB — CBC
HCT: 41 % (ref 35.0–47.0)
HGB: 13.6 g/dL (ref 12.0–16.0)
MCV: 94 fL (ref 80–100)
RDW: 14.1 % (ref 11.5–14.5)
WBC: 13.5 10*3/uL — ABNORMAL HIGH (ref 3.6–11.0)

## 2011-12-08 LAB — COMPREHENSIVE METABOLIC PANEL
Alkaline Phosphatase: 78 U/L (ref 50–136)
Anion Gap: 8 (ref 7–16)
BUN: 18 mg/dL (ref 7–18)
Calcium, Total: 9 mg/dL (ref 8.5–10.1)
Chloride: 102 mmol/L (ref 98–107)
Co2: 30 mmol/L (ref 21–32)
Creatinine: 0.68 mg/dL (ref 0.60–1.30)
EGFR (African American): >60
EGFR (Non-African Amer.): >60
Osmolality: 280 (ref 275–301)
Potassium: 4.2 mmol/L (ref 3.5–5.1)
SGOT(AST): 31 U/L (ref 15–37)

## 2011-12-08 LAB — DIGOXIN LEVEL: Digoxin: 0.8 ng/mL

## 2011-12-08 NOTE — Telephone Encounter (Signed)
Pt called stating she had EMS at her house now, EKG shows a fib with HR of 120. Pt cancelled her appt this past week that was scheduled 12/06/11, and is now stating that EMS says if Dr. Mariah Milling could evaluate her in clinic she would not need to go to ER.  Spoke to Dr. Mariah Milling, he advised her to take extra Amiodarone and Digoxin as he instructed per last note, otherwise if pt is symptomatic to go to ER.  I discussed this w/ pt, she confirms she has already taking total of 400mg  Amio recently and Digoxin, she feels "shaky" and unstable. I told pt to have EMS take her to ER as she has taken advised meds and still symptomatic.

## 2011-12-09 ENCOUNTER — Telehealth: Payer: Self-pay | Admitting: Pulmonary Disease

## 2011-12-09 ENCOUNTER — Telehealth: Payer: Self-pay | Admitting: Internal Medicine

## 2011-12-09 NOTE — Telephone Encounter (Signed)
Pt has COPD, has been seeing Dr. Cora Daniels in [place].  I explained to pt that he and Dr. Kendrick Fries are in the same practice, she still wants to see him.

## 2011-12-09 NOTE — Telephone Encounter (Signed)
Called and spoke with pt. Pt aware of KC's response and recs.  Pt verbalized understanding and denied any further questions.

## 2011-12-09 NOTE — Telephone Encounter (Signed)
The vest will not cause "pleurisy" unless she has a broken rib. The vest should not cause afib, but may possibly aggrevate it, though I think unlikely.   If she feels strongly about it, can hold treatment for a week and see how she does.

## 2011-12-09 NOTE — Telephone Encounter (Signed)
Pt would like to get referral to see Dr Kendrick Fries here in [place] for 2nd opinion [phone]

## 2011-12-09 NOTE — Telephone Encounter (Signed)
Called and spoke with pt.  Pt states she went to the ER yesterday d/t A. Fib and was also having L sided chest pain.  Pt states she was dx with pleurisy.  Pt states since starting the VEST, she has had 2 episodes of a fib and 1 episode of pleurisy.  She is wanting to know if the VEST could be causing either of these.  Please advise.  Thanks.

## 2011-12-09 NOTE — Telephone Encounter (Signed)
Ok with me.  Dr. Henrene Pastor is much more convenient for her, in [place]

## 2011-12-10 ENCOUNTER — Telehealth: Payer: Self-pay | Admitting: *Deleted

## 2011-12-10 ENCOUNTER — Telehealth: Payer: Self-pay | Admitting: Pulmonary Disease

## 2011-12-10 MED ORDER — FLECAINIDE ACETATE 50 MG PO TABS: 50.0000 mg | ORAL_TABLET | Freq: Two times a day (BID) | ORAL | Status: DC

## 2011-12-10 NOTE — Telephone Encounter (Signed)
Pt states she wants to stay on the advair and would like for [place] to do the PA.

## 2011-12-10 NOTE — Telephone Encounter (Signed)
Per Dr. Mariah Milling, notified pt to start Flecainide 50mg  BID and f/u in 1 week. Pt will start this evening and has f/u 2/5.

## 2011-12-10 NOTE — Telephone Encounter (Signed)
Pt can stay with advair and we can do prior auth, or she can switch to symbicort 160/4.5  2 bid.

## 2011-12-10 NOTE — Telephone Encounter (Signed)
Error.Hannah Kline ° °

## 2011-12-10 NOTE — Telephone Encounter (Signed)
Member ID # [id number]. Form received stating that Hannah Kline is not covered by pt's insurance. When researching what the pt has tried and failed in the past, it looks like the pt is back on Advair. I told the rep this and she said that the Advair will also require a PA. Their preferred alternative is Symbicort. Pls advise. Allergies  Allergen Reactions  . Isoniazid     unknown  . Moxifloxacin     Avelox  REACTION: chills, fainting

## 2011-12-13 ENCOUNTER — Other Ambulatory Visit: Payer: Self-pay | Admitting: Pulmonary Disease

## 2011-12-13 MED ORDER — FLUTICASONE-SALMETEROL 250-50 MCG/DOSE IN AEPB: 1.0000 | INHALATION_SPRAY | Freq: Two times a day (BID) | RESPIRATORY_TRACT | Status: DC

## 2011-12-13 NOTE — Telephone Encounter (Signed)
Per rep at [facility], Advair IS covered under the pt's particular plan and she is not sure why we were told it was not covered. Pt is aware Advair is covered and she should not have any problems filling this at her pharmacy. She will call if there are any problems or questions.

## 2011-12-14 ENCOUNTER — Encounter: Payer: Self-pay | Admitting: Cardiovascular Disease

## 2011-12-14 ENCOUNTER — Ambulatory Visit (INDEPENDENT_AMBULATORY_CARE_PROVIDER_SITE_OTHER): Payer: Medicare Other | Admitting: Cardiovascular Disease

## 2011-12-14 ENCOUNTER — Encounter: Payer: Self-pay | Admitting: Internal Medicine

## 2011-12-14 VITALS — BP 119/75 | HR 65 | Ht 68.0 in | Wt 133.0 lb

## 2011-12-14 DIAGNOSIS — I4891 Unspecified atrial fibrillation: Secondary | ICD-10-CM

## 2011-12-14 DIAGNOSIS — J479 Bronchiectasis, uncomplicated: Secondary | ICD-10-CM

## 2011-12-14 NOTE — Telephone Encounter (Signed)
You can go ahead and contact the [place] office to schedule with Dr. Kendrick Fries, tell them that patient says [place] is more conveient.

## 2011-12-14 NOTE — Telephone Encounter (Signed)
Does she need referral for this or can i just schedule

## 2011-12-14 NOTE — Telephone Encounter (Signed)
A-ok with me 

## 2011-12-14 NOTE — Progress Notes (Signed)
Patient ID: Hannah Kline, female    DOB: 07-01-1932, 78 y.o.   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  She continues to have episodes of atrial fibrillation, 2 episodes in the last month. One on January 13 lasting for several days, the second episode on January 30, with extra to the emergency room. We have been reluctant to start amiodarone on a regular basis given her underlying lung disease. She has taken this p.r.n. With limited success. Otherwise she feels okay. She continues to use her flutter with fast and is on ciprofloxacin for  brief Periods every month She has had problems with hypotension in the past. She does have diltiazem and propranolol to take as needed for arrhythmia.  She had a CT scan in the ER for left-sided chest pain that showed diffuse, extensive pulmonary findings including multifocal nodular opacities and chronic cystic changes, enlarged right hilar lymph node  Periods of hypoxia in the past with working out. She does not want oxygen. Her last echocardiogram was approximately 5 years ago in [place].  EKG shows Normal sinus rhythm with rate 62 beats per minute with incomplete right bundle branch block, left anterior fascicular block, T-wave abnormality in one and aVL    Outpatient Encounter Prescriptions as of 12/14/2011  Medication Sig Dispense Refill  . ADVAIR DISKUS 250-50 MCG/DOSE AEPB USE ONE INHALATION TWO TIMES A DAY RINSE MOUTH WELL  60 each  4  . ciprofloxacin (CIPRO) 500 MG tablet Take 1 tablet (500 mg total) by mouth 2 (two) times daily.  14 tablet  5  . dorzolamide-timolol (COSOPT) 22.3-6.8 MG/ML ophthalmic solution       . latanoprost  (XALATAN) 0.005 % ophthalmic solution Place 1 drop into the left eye At bedtime.      . levalbuterol (XOPENEX HFA) 45 MCG/ACT inhaler Inhale 2 puffs into the lungs every 4 (four) hours as needed.        Marland Kitchen levothyroxine (SYNTHROID, LEVOTHROID) 75 MCG tablet Take 1 tablet (75 mcg total) by mouth daily.  90 tablet  3  . nebivolol (BYSTOLIC) 5 MG tablet Take 5 mg by mouth daily as needed.        Marland Kitchen DISCONTD: dorzolamide (TRUSOPT) 2 % ophthalmic solution Place 1 drop into the left eye 2 (two) times daily.        Marland Kitchen DISCONTD: Fluticasone-Salmeterol (ADVAIR DISKUS) 250-50 MCG/DOSE AEPB Inhale 1 puff into the lungs 2 (two) times daily.  180 each  1     Review of Systems  Constitutional: Negative.   HENT: Negative.   Eyes: Negative.   Respiratory: Positive for cough and shortness of breath.   Cardiovascular: Negative.   Gastrointestinal: Negative.   Musculoskeletal: Negative.   Skin: Negative.   Neurological: Negative.   Hematological: Negative.   Psychiatric/Behavioral: Negative.   All other systems reviewed and are negative.     BP 119/75  Pulse 65  Ht 5\' 8"  (1.727 m)  Wt 133 lb (60.328 kg)  BMI 20.22 kg/m2  Physical Exam  Nursing note and vitals reviewed. Constitutional: She is oriented to person, place, and time. She appears well-developed and well-nourished.  HENT:  Head: Normocephalic.  Nose: Nose normal.  Mouth/Throat: Oropharynx is clear and moist.  Eyes: Conjunctivae are normal. Pupils are equal, round, and reactive to light.  Neck: Normal range of motion. Neck supple. No JVD present.  Cardiovascular: Normal rate, regular rhythm, S1 normal, S2 normal, normal heart sounds and intact distal pulses.  Exam reveals no gallop and no friction rub.   No murmur heard. Pulmonary/Chest: Effort normal and breath sounds normal. No respiratory distress. She has no wheezes. She has no rales. She exhibits no tenderness.  Abdominal: Soft. Bowel sounds are normal. She exhibits no distension.  There is no tenderness.  Musculoskeletal: Normal range of motion. She exhibits no edema and no tenderness.  Lymphadenopathy:    She has no cervical adenopathy.  Neurological: She is alert and oriented to person, place, and time. Coordination normal.  Skin: Skin is warm and dry. No rash noted. No erythema.  Psychiatric: She has a normal mood and affect. Her behavior is normal. Judgment and thought content normal.         Assessment and Plan

## 2011-12-14 NOTE — Assessment & Plan Note (Signed)
For her atrial fibrillation, given to breakthrough episodes in the past 2 weeks, we will start flecainide 50 mg b.i.d.. If she continues to have episodes, we could either increase the dose of flecainide or start digoxin 0.125 mg daily.

## 2011-12-14 NOTE — Telephone Encounter (Signed)
Sent to Robin to schedule.

## 2011-12-14 NOTE — Patient Instructions (Signed)
  Please start flecainide 50 mg twice a day For breakthrough atrial fibrillation, take flecainide x 2 Ok to take diltiazem or propranolol as needed for breathrough atrial fibrillation.  Please call us if you have new issues that need to be addressed before your next appt.  Your physician wants you to follow-up in: 3 months.  You will receive a reminder letter in the mail two months in advance. If you don't receive a letter, please call our office to schedule the follow-up appointment.

## 2011-12-14 NOTE — Assessment & Plan Note (Signed)
Continued problems with her lungs are likely contributing to cardiac arrhythmia. She is using a flutter vest and periodic antibiotics

## 2011-12-14 NOTE — Telephone Encounter (Signed)
Dr. Shelle Iron, is this ok for the pt to switch to Dr. Kendrick Fries? Thanks!

## 2011-12-15 NOTE — Telephone Encounter (Signed)
I spoke with pt and is scheduled to see BQ on 2/25 at 4:30. Pt aware address and to arrive 15 min early to fill out paperwork.

## 2011-12-20 DIAGNOSIS — H4010X0 Unspecified open-angle glaucoma, stage unspecified: Secondary | ICD-10-CM | POA: Diagnosis not present

## 2011-12-21 ENCOUNTER — Telehealth: Payer: Self-pay | Admitting: Pulmonary Disease

## 2011-12-21 MED ORDER — FLUTICASONE-SALMETEROL 250-50 MCG/DOSE IN AEPB: INHALATION_SPRAY | RESPIRATORY_TRACT | Status: DC

## 2011-12-21 NOTE — Telephone Encounter (Signed)
lmomtcb x1 for pt  I have called and fixed rx into to [facility]

## 2011-12-21 NOTE — Telephone Encounter (Signed)
Pt returned call. Kathleen W Perdue  

## 2011-12-21 NOTE — Telephone Encounter (Signed)
Spoke with pt and notified 90 day supply of advair was sent to [facility]. She states nothing further needed.

## 2012-01-03 ENCOUNTER — Ambulatory Visit (INDEPENDENT_AMBULATORY_CARE_PROVIDER_SITE_OTHER): Payer: Medicare Other | Admitting: Pulmonary Disease

## 2012-01-03 ENCOUNTER — Encounter: Payer: Self-pay | Admitting: Pulmonary Disease

## 2012-01-03 VITALS — BP 124/72 | HR 66 | Temp 97.2°F | Ht 68.0 in | Wt 136.1 lb

## 2012-01-03 DIAGNOSIS — R911 Solitary pulmonary nodule: Secondary | ICD-10-CM | POA: Diagnosis not present

## 2012-01-03 DIAGNOSIS — R0902 Hypoxemia: Secondary | ICD-10-CM | POA: Diagnosis not present

## 2012-01-03 DIAGNOSIS — J329 Chronic sinusitis, unspecified: Secondary | ICD-10-CM

## 2012-01-03 MED ORDER — MOMETASONE FUROATE 50 MCG/ACT NA SUSP: 2.0000 | Freq: Every day | NASAL | Status: DC

## 2012-01-03 NOTE — Assessment & Plan Note (Signed)
Unclear etiology, allergic rhinitis would be the most likely cause.  Start Nasonex two puffs daily and [facility] med rinses. If no improvement will consider ipratropium nasal sprays.

## 2012-01-03 NOTE — Assessment & Plan Note (Signed)
She had an 8mm nodule noted on her most recent CT scan.  I agree with radiology that this is most likely related to her MAI, however she is nervous about it.  We discussed the risks of serial screening, the potential unnecessary biopsy, etc.  She voiced understanding and stated that she still wants to follow the nodule.  We will order a 3 month follow up.

## 2012-01-03 NOTE — Assessment & Plan Note (Signed)
Mucociliary clearance: I agree with vibropercussion vest bid, but I advised her that her morning exercise session could substitute for one session a day.  There is no data for adding pulmozyme or hypertonic saline, but these could be considered if she worsens.  Chronic bacterial infection (pseudomonas): I advised her that we will never eliminate this, but I think that the monthly Cipro is a reasonable idea,, analagous to inhaled tobi.  It is too early to tell at this point if it has made any difference or not as she just started this three weeks ago.  Cause: MAI. We will continue to screen for this with sputum cultures, we will check one on the next visit.  (Approximately q64month checks is reasonable).

## 2012-01-03 NOTE — Patient Instructions (Signed)
We will start you on oxygen, 2 liters per minute to be used with exertion only. We will send in a script for nasonex.  Use two puffs each nostril once per day, 30 minutes after using the [place] [facility] rinses. Continue using the cipro monhtly. Continue using the advair and xopenex. We will send you for a CT scan of your lungs to evaluate the nodule seen on the prior study.  We will schedule this for 01/2012.  We will see you back in 3 months.

## 2012-01-03 NOTE — Progress Notes (Signed)
Subjective:    Patient ID: Hannah Kline, female    DOB: Nov 28, 1931, 78 y.o.   MRN: [id number]  HPI This is a 78 y/o female with bronchiectasis due to MAI who has been previously evaluated and managed by Dr. Shelle Iron who comes in to establish care with [place] in the [facility] office.  She was diagnosed with bronchiectasis at age 40 in [place], [place].  She was found to have MAI and was treated with Rifampin, Ethambutol, and Clarithromycin for two years.  Since she does not believe that she has ever grown out MAI.  She continues to have daily sputum production and some dyspnea.  However she exercises regularly nearly every morning.  She does not use oxygen with exertion even though she is known to desaturate on prior oxygenation testing.  She recently had some blood tinged sputum in the last year.  After discussion with Dr. Shelle Iron she was started on rotating Cipro, one week on, three weeks off.  She just started this three weeks ago.  Overall she fells well today.  Past Medical History  Diagnosis Date  . Atrial fibrillation   . History of Mycobacterium avium complex infection     lung disease  . COPD (chronic obstructive pulmonary disease)    . Bronchiectasis   . Hepatitis     h/o INH  . Bacterial infection due to Pseudomonas   . Pleurisy 12/08/11     Family History  Problem Relation Age of Onset  . Other Mother     intestional blockage  . Angina Mother   . Diabetes Maternal Grandfather      History   Social History  . Marital Status: Married    Spouse Name: N/A    Number of Children: N/A  . Years of Education: N/A   Occupational History  . Not on file.   Social History Main Topics  . Smoking status: Never Smoker   . Smokeless tobacco: Never Used  . Alcohol Use: 0.0 - 0.5 oz/week    0-1 drink(s) per week  . Drug Use: No  . Sexually Active: Not on file   Other Topics Concern  . Not on file   Social History Narrative  . No narrative on file     Allergies    Allergen Reactions  . Isoniazid     unknown  . Moxifloxacin     Avelox  REACTION: chills, fainting     Outpatient Prescriptions Prior to Visit  Medication Sig Dispense Refill  . dorzolamide-timolol (COSOPT) 22.3-6.8 MG/ML ophthalmic solution Place 1 drop into the left eye every 12 (twelve) hours.       . flecainide (TAMBOCOR) 50 MG tablet Take 1 tablet (50 mg total) by mouth 2 (two) times daily.  60 tablet  6  . Fluticasone-Salmeterol (ADVAIR DISKUS) 250-50 MCG/DOSE AEPB 1 puff twice a day rinse mouth after each use  180 each  4  . latanoprost (XALATAN) 0.005 % ophthalmic solution Place 1 drop into the left eye At bedtime.      . levalbuterol (XOPENEX HFA) 45 MCG/ACT inhaler Inhale 2 puffs into the lungs every 4 (four) hours as needed.        Marland Kitchen levothyroxine (SYNTHROID, LEVOTHROID) 75 MCG tablet Take 1 tablet (75 mcg total) by mouth daily.  90 tablet  3  . nebivolol (BYSTOLIC) 5 MG tablet Take 5 mg by mouth daily as needed.          Review of Systems  Constitutional: Negative for  fever, chills and unexpected weight change.  HENT: Positive for rhinorrhea. Negative for ear pain, nosebleeds, congestion, sore throat, sneezing, trouble swallowing, dental problem, voice change, postnasal drip and sinus pressure.   Eyes: Negative for visual disturbance.  Respiratory: Positive for cough and shortness of breath. Negative for choking.   Cardiovascular: Negative for chest pain and leg swelling.  Gastrointestinal: Negative for vomiting, abdominal pain and diarrhea.  Genitourinary: Negative for difficulty urinating.  Musculoskeletal: Negative for arthralgias.  Skin: Negative for rash.  Neurological: Negative for tremors, syncope and headaches.  Hematological: Does not bruise/bleed easily.       Objective:   Physical Exam Filed Vitals:   01/03/12 1633  BP: 124/72  Pulse: 66  Temp: 97.2 F (36.2 C)  TempSrc: Oral  Height: 5\' 8"  (1.727 m)  Weight: 61.744 kg (136 lb 1.9 oz)  SpO2: 98%    Gen: well appearing, no acute distress HEENT: NCAT, PERRL, EOMi, OP clear, neck supple without masses PULM: Insp crackles worse in upper lobes than bases, rhonchi noted CV: RRR, no mgr, no JVD AB: BS+, soft, nontender, no hsm Ext: warm, no edema, no clubbing, no cyanosis Derm: no rash or skin breakdown Neuro: A&Ox4, CN II-XII intact, strength 5/5 in all 4 extremities  Review of 2012 sputum microbiology: Pan sensitive pseudomonas  09/2010 Full PFT: Ratio 63%, FEV1 1.49L (67% pred)  10/2011 CT Chest: impressive upper lobe cystic bronchiectasis, scattered lower lobe tree-in-bud abnormalities and scattered nodules; One RML solid nodule measures 7.48mm    Assessment & Plan:   BRONCHIECTASIS Mucociliary clearance: I agree with vibropercussion vest bid, but I advised her that her morning exercise session could substitute for one session a day.  There is no data for adding pulmozyme or hypertonic saline, but these could be considered if she worsens.  Chronic bacterial infection (pseudomonas): I advised her that we will never eliminate this, but I think that the monthly Cipro is a reasonable idea,, analagous to inhaled tobi.  It is too early to tell at this point if it has made any difference or not as she just started this three weeks ago.  Cause: MAI. We will continue to screen for this with sputum cultures, we will check one on the next visit.  (Approximately q38month checks is reasonable).  Chronic sinusitis Unclear etiology, allergic rhinitis would be the most likely cause.  Start Nasonex two puffs daily and Lloyd Huger med rinses. If no improvement will consider ipratropium nasal sprays.  Pulmonary nodule She had an 8mm nodule noted on her most recent CT scan.  I agree with radiology that this is most likely related to her MAI, however she is nervous about it.  We discussed the risks of serial screening, the potential unnecessary biopsy, etc.  She voiced understanding and stated that she still  wants to follow the nodule.  We will order a 3 month follow up.    Updated Medication List Outpatient Encounter Prescriptions as of 01/03/2012  Medication Sig Dispense Refill  . dorzolamide-timolol (COSOPT) 22.3-6.8 MG/ML ophthalmic solution Place 1 drop into the left eye every 12 (twelve) hours.       . flecainide (TAMBOCOR) 50 MG tablet Take 1 tablet (50 mg total) by mouth 2 (two) times daily.  60 tablet  6  . Fluticasone-Salmeterol (ADVAIR DISKUS) 250-50 MCG/DOSE AEPB 1 puff twice a day rinse mouth after each use  180 each  4  . latanoprost (XALATAN) 0.005 % ophthalmic solution Place 1 drop into the left eye At bedtime.      Marland Kitchen  levalbuterol (XOPENEX HFA) 45 MCG/ACT inhaler Inhale 2 puffs into the lungs every 4 (four) hours as needed.        Marland Kitchen levothyroxine (SYNTHROID, LEVOTHROID) 75 MCG tablet Take 1 tablet (75 mcg total) by mouth daily.  90 tablet  3  . mometasone (NASONEX) 50 MCG/ACT nasal spray Place 2 sprays into the nose daily.  17 g  2  . nebivolol (BYSTOLIC) 5 MG tablet Take 5 mg by mouth daily as needed.

## 2012-01-04 ENCOUNTER — Telehealth: Payer: Self-pay | Admitting: Pulmonary Disease

## 2012-01-04 NOTE — Telephone Encounter (Signed)
Pt states she was in the hospital for A.fib and pleural effusion and a CT Chest was obtained 1/30 at [facility]. Pt wants to know if Dr. Kendrick Fries still wants her to keep the scheduled CT for 01/21/12. Please advise, thanks.

## 2012-01-04 NOTE — Telephone Encounter (Signed)
[id number] x1 

## 2012-01-05 NOTE — Telephone Encounter (Signed)
Yes, she needs one three months after the 10/2011 CT chest.

## 2012-01-05 NOTE — Telephone Encounter (Signed)
Pt aware Dr. Kendrick Fries does want her to have repeat Ct on 01/21/12 and verbalized understanding of this.

## 2012-01-05 NOTE — Telephone Encounter (Signed)
[id number] x1 

## 2012-01-21 ENCOUNTER — Ambulatory Visit: Payer: Self-pay | Admitting: Pulmonary Disease

## 2012-01-21 DIAGNOSIS — R911 Solitary pulmonary nodule: Secondary | ICD-10-CM | POA: Diagnosis not present

## 2012-01-21 DIAGNOSIS — R918 Other nonspecific abnormal finding of lung field: Secondary | ICD-10-CM | POA: Diagnosis not present

## 2012-01-27 ENCOUNTER — Encounter: Payer: Self-pay | Admitting: Pulmonary Disease

## 2012-01-28 ENCOUNTER — Telehealth: Payer: Self-pay

## 2012-01-28 NOTE — Telephone Encounter (Signed)
Pt also needs to know when her next follow up needs to be

## 2012-01-28 NOTE — Telephone Encounter (Signed)
Pt has been scheduled for follow-up in [place] on Mon., 03/27/12. Dr. Kendrick Fries, pt is anxious for chest CT results from 01/21/12. Pls advise.

## 2012-01-31 NOTE — Telephone Encounter (Signed)
I called her to explain the results.  Please order a CT chest without contrast at [facility] in three months and move her next appointment with me to sometime shortly after the CT scan.  Thanks, [employer]

## 2012-01-31 NOTE — Telephone Encounter (Signed)
Per Hannah Kline- Dr Kendrick Fries called and stated that he has reviewed the ct chest results, and plans to call her sometime today to discuss these with her. I called and made pt aware of this.

## 2012-02-01 NOTE — Telephone Encounter (Signed)
Addended by: Christen Butter on: 02/01/2012 08:53 AM   Modules accepted: Orders

## 2012-02-01 NOTE — Telephone Encounter (Addendum)
Order sent to [facility] for CT chest in 3 months at [facility].  Spoke with pt and notified will cancel her May appt and she will need to followup in June p ct is done. The schedule for BQ is not open yet for June, so I advised pt to call next month and the schedule will be open and she can make her ov to f/u p ct. Pt verbalized understanding and denied any questions.

## 2012-02-21 ENCOUNTER — Ambulatory Visit: Payer: Medicare Other | Admitting: Pulmonary Disease

## 2012-02-21 DIAGNOSIS — R0902 Hypoxemia: Secondary | ICD-10-CM | POA: Insufficient documentation

## 2012-02-21 NOTE — Assessment & Plan Note (Signed)
Walk in clinic on RA produced O2 saturation of 88%.  After lengthy discussion with patient she agreed to start O2 2 L/min.

## 2012-03-01 DIAGNOSIS — L820 Inflamed seborrheic keratosis: Secondary | ICD-10-CM | POA: Diagnosis not present

## 2012-03-01 DIAGNOSIS — Z85820 Personal history of malignant melanoma of skin: Secondary | ICD-10-CM | POA: Diagnosis not present

## 2012-03-01 DIAGNOSIS — Z85828 Personal history of other malignant neoplasm of skin: Secondary | ICD-10-CM | POA: Diagnosis not present

## 2012-03-01 DIAGNOSIS — L819 Disorder of pigmentation, unspecified: Secondary | ICD-10-CM | POA: Diagnosis not present

## 2012-03-27 ENCOUNTER — Ambulatory Visit: Payer: Medicare Other | Admitting: Pulmonary Disease

## 2012-04-13 ENCOUNTER — Ambulatory Visit: Payer: Medicare Other | Admitting: Cardiovascular Disease

## 2012-04-18 ENCOUNTER — Encounter: Payer: Self-pay | Admitting: Cardiovascular Disease

## 2012-04-18 ENCOUNTER — Ambulatory Visit (INDEPENDENT_AMBULATORY_CARE_PROVIDER_SITE_OTHER): Payer: Medicare Other | Admitting: Cardiovascular Disease

## 2012-04-18 VITALS — BP 108/72 | HR 64 | Ht 68.0 in | Wt 134.5 lb

## 2012-04-18 DIAGNOSIS — I4891 Unspecified atrial fibrillation: Secondary | ICD-10-CM

## 2012-04-18 DIAGNOSIS — A310 Pulmonary mycobacterial infection: Secondary | ICD-10-CM

## 2012-04-18 MED ORDER — FLECAINIDE ACETATE 50 MG PO TABS: 50.0000 mg | ORAL_TABLET | Freq: Two times a day (BID) | ORAL | Status: DC

## 2012-04-18 NOTE — Patient Instructions (Signed)
You are doing well. No medication changes were made.  If your cholesterol is high when Dr. Darrick Huntsman checks it, you could consider Red Yeast Rice  Please call us if you have new issues that need to be addressed before your next appt.  Your physician wants you to follow-up in: 6 months.  You will receive a reminder letter in the mail two months in advance. If you don't receive a letter, please call our office to schedule the follow-up appointment.

## 2012-04-18 NOTE — Assessment & Plan Note (Signed)
We have suggested she stay on her flecainide 50 mg twice a day.

## 2012-04-18 NOTE — Progress Notes (Signed)
Patient ID: Hannah Kline, female    DOB: 08/26/1932, 78 y.o.   MRN: [id number]  HPI Comments: Hannah Kline is a very pleasant 78 year old woman with a remote history of Mycobacterium avium with residual lung disease/Bronchiectasis, on inhalers, with a history of atrial fibrillation (Last episode at the end of March with conversion to normal sinus rhythm after amiodarone p.r.n.), who presents for followup of her  atrial fibrillation.  In the past, she has failed sotalol. We do not want to continue on a nearly round given underlying lung disease. She was started on  Flecainide 50 mg twice a day with good success. She denies any significant atrial fibrillation since her last clinic visit. She is participating in regular exercise at [employer] [place]. She uses oxygen when necessary. She continues to have chronic problems with coughing and sputum production. Overall she feels stable and her lungs are better. Her conditioning has improved  EKG shows Normal sinus rhythm with rate 64 beats per minute with incomplete right bundle branch block, left anterior fascicular block    Outpatient Encounter Prescriptions as of 04/18/2012  Medication Sig Dispense Refill  . dorzolamide-timolol (COSOPT) 22.3-6.8 MG/ML ophthalmic solution Place 1 drop into the left eye every 12 (twelve) hours.       . flecainide (TAMBOCOR) 50 MG tablet Take 1 tablet (50 mg total) by mouth 2 (two) times daily.  60 tablet  11  . Fluticasone-Salmeterol (ADVAIR DISKUS) 250-50 MCG/DOSE AEPB 1 puff twice a day rinse mouth after each use  180 each  4  . latanoprost (XALATAN) 0.005 % ophthalmic solution Place 1 drop into the left eye At bedtime.      . levalbuterol (XOPENEX HFA) 45 MCG/ACT inhaler Inhale 2 puffs into the lungs every 4 (four) hours as needed.        Marland Kitchen levothyroxine (SYNTHROID, LEVOTHROID) 75 MCG tablet Take 1 tablet (75 mcg total) by mouth daily.  90 tablet  3  . mometasone (NASONEX) 50 MCG/ACT nasal spray Place 2 sprays into the nose  daily.  17 g  2   Review of Systems  Constitutional: Negative.   HENT: Negative.   Eyes: Negative.   Respiratory: Positive for cough.   Cardiovascular: Negative.   Gastrointestinal: Negative.   Musculoskeletal: Negative.   Skin: Negative.   Neurological: Negative.   Hematological: Negative.   Psychiatric/Behavioral: Negative.   All other systems reviewed and are negative.     BP 108/72  Pulse 64  Ht 5\' 8"  (1.727 m)  Wt 134 lb 8 oz (61.009 kg)  BMI 20.45 kg/m2  Physical Exam  Nursing note and vitals reviewed. Constitutional: She is oriented to person, place, and time. She appears well-developed and well-nourished.  HENT:  Head: Normocephalic.  Nose: Nose normal.  Mouth/Throat: Oropharynx is clear and moist.  Eyes: Conjunctivae are normal. Pupils are equal, round, and reactive to light.  Neck: Normal range of motion. Neck supple. No JVD present.  Cardiovascular: Normal rate, regular rhythm, S1 normal, S2 normal, normal heart sounds and intact distal pulses.  Exam reveals no gallop and no friction rub.   No murmur heard. Pulmonary/Chest: Effort normal. No respiratory distress. She has no wheezes. She has rales. She exhibits no tenderness.  Abdominal: Soft. Bowel sounds are normal. She exhibits no distension. There is no tenderness.  Musculoskeletal: Normal range of motion. She exhibits no edema and no tenderness.  Lymphadenopathy:    She has no cervical adenopathy.  Neurological: She is alert and oriented to person, place, and  time. Coordination normal.  Skin: Skin is warm and dry. No rash noted. No erythema.  Psychiatric: She has a normal mood and affect. Her behavior is normal. Judgment and thought content normal.         Assessment and Plan

## 2012-04-18 NOTE — Assessment & Plan Note (Signed)
She is seen by Dr. Kendrick Fries. Pulmonary status appears stable.

## 2012-05-02 ENCOUNTER — Telehealth: Payer: Self-pay | Admitting: Pulmonary Disease

## 2012-05-02 ENCOUNTER — Ambulatory Visit: Payer: Self-pay | Admitting: Pulmonary Disease

## 2012-05-02 DIAGNOSIS — J479 Bronchiectasis, uncomplicated: Secondary | ICD-10-CM | POA: Diagnosis not present

## 2012-05-02 DIAGNOSIS — R911 Solitary pulmonary nodule: Secondary | ICD-10-CM

## 2012-05-02 DIAGNOSIS — R918 Other nonspecific abnormal finding of lung field: Secondary | ICD-10-CM | POA: Diagnosis not present

## 2012-05-02 NOTE — Telephone Encounter (Signed)
Per phone msg from 01/28/12:  Hannah Kline, [facility] 02/01/2012 8:53 AM Addendum  Order sent to [facility] for CT chest in 3 months at [facility].  Spoke with pt and notified will cancel her May appt and she will need to followup in June p ct is done. The schedule for BQ is not open yet for June, so I advised pt to call next month and the schedule will be open and she can make her ov to f/u p ct. Pt verbalized understanding and denied any questions.  Previous Version  Max Fickle, MD 01/31/2012 7:01 PM Signed  I called her to explain the results.  Please order a CT chest without contrast at [facility] in three months and move her next appointment with me to sometime shortly after the CT scan.  Thanks,  [place]  ---  It does not look like the order that leslie placed was ever sent to [facility] for the June CT.  Spoke with Misty Stanley at [facility] who states they didn't receive order for this but was aware she needed a repeat CT Chest done from March.  They have already done CT Chest but need an order sent for this.  Advised I would place order and have PCC's send to her.  She verbalized understanding and voiced no further questions/concerns at this.  Note:  Order placed for CT -- Alida aware.  Also, pt does have a pending OV with Dr. Kendrick Fries on May 15, 2012.

## 2012-05-08 DIAGNOSIS — W57XXXA Bitten or stung by nonvenomous insect and other nonvenomous arthropods, initial encounter: Secondary | ICD-10-CM | POA: Diagnosis not present

## 2012-05-08 DIAGNOSIS — T148 Other injury of unspecified body region: Secondary | ICD-10-CM | POA: Diagnosis not present

## 2012-05-15 ENCOUNTER — Encounter: Payer: Self-pay | Admitting: Pulmonary Disease

## 2012-05-15 ENCOUNTER — Ambulatory Visit (INDEPENDENT_AMBULATORY_CARE_PROVIDER_SITE_OTHER): Payer: Medicare Other | Admitting: Pulmonary Disease

## 2012-05-15 VITALS — BP 114/62 | HR 62 | Temp 98.0°F | Ht 68.0 in | Wt 134.0 lb

## 2012-05-15 DIAGNOSIS — R0902 Hypoxemia: Secondary | ICD-10-CM | POA: Diagnosis not present

## 2012-05-15 DIAGNOSIS — J479 Bronchiectasis, uncomplicated: Secondary | ICD-10-CM | POA: Diagnosis not present

## 2012-05-15 DIAGNOSIS — R911 Solitary pulmonary nodule: Secondary | ICD-10-CM | POA: Diagnosis not present

## 2012-05-15 NOTE — Patient Instructions (Addendum)
Stop using the Cipro. Keep using the oxygen. Keep exercising and using your vest as you are doing. Keep using the other medications as written.

## 2012-05-15 NOTE — Progress Notes (Signed)
Subjective:    Patient ID: Hannah Kline, female    DOB: 05/03/1932, 78 y.o.   MRN: [id number] Synopsis: Hannah Kline established care with the [facility] pulmonary office in February 2013 for bronchiectasis due to MAI. She was diagnosed at age 82 in [place]. She was treated with rifampin ethambutol and clarithromycin for 2 years. Since then she does not believe she was ever grown out MAI.   HPI  7/8 routine office visit--Hannah Kline says that she's doing quite well and is now exercising 5 days a week. She is using pulse oximetry and states that usually during her exercise routine her oxygen stays above 92% on room air. However sometimes with her weight routine it drops below 88%. She takes albuterol before going to class and this helps quite a bit. She does not think that the Cipro has helped and she would like to stop. She continues to cough daily often getting up one half cup of sputum per day. It is typically pink but lately it has been yellow. She denies fevers chills. She is frustrated that her weight will not go up from 130 pounds.   Past Medical History  Diagnosis Date  . Atrial fibrillation   . History of Mycobacterium avium complex infection     lung disease  . COPD (chronic obstructive pulmonary disease)    . Bronchiectasis   . Hepatitis     h/o INH  . Bacterial infection due to Pseudomonas   . Pleurisy 12/08/11     Review of Systems  Constitutional: Negative for fever, chills, fatigue and unexpected weight change.  HENT: Positive for congestion. Negative for ear pain and nosebleeds.   Respiratory: Positive for cough and shortness of breath. Negative for choking.   Cardiovascular: Negative for chest pain and leg swelling.       Objective:   Physical Exam  Filed Vitals:   05/15/12 1525  BP: 114/62  Pulse: 62  Temp: 98 F (36.7 C)  TempSrc: Oral  Height: 5\' 8"  (1.727 m)  Weight: 134 lb (60.782 kg)  SpO2: 97%   Gen: well appearing, no  acute distress HEENT: NCAT, PERRL, EOMi, OP clear, neck supple without masses PULM: Insp crackles worse in upper lobes than bases, rhonchi noted CV: RRR, no mgr, no JVD AB: BS+, soft, nontender, no hsm Ext: warm, no edema, no clubbing, no cyanosis  Review of 2012 sputum microbiology: Pan sensitive pseudomonas  09/2010 Full PFT: Ratio 63%, FEV1 1.49L (67% pred)  10/2011 CT Chest: impressive upper lobe cystic bronchiectasis, scattered lower lobe tree-in-bud abnormalities and scattered nodules; One RML solid nodule measures 7.91mm  January 2013 CT chest [facility]: Stable upper lobe cystic bronchiectasis also present in the right middle lobe. Scattered tree in bud abnormalities and nodules throughout all lung fields roughly unchanged from prior. Right middle lobe nodule is no longer visible but there is an 8mm nodule in the right upper lobe.    Assessment & Plan:   BRONCHIECTASIS This has been a stable interval for [place]. In fact she states she's doing better than she can remember in quite some time. I don't think she needs the Cipro anymore stool continue her on the Advair as prescribed. Review of her CT shows changes consistent with MAI she still produces 1/2 cup of sputum a day. I question whether or not she still has active MAI disease but I think she is reluctant to restart antibiotic therapy for that. She does not have a change in sputum production  or ongoing fevers or progressive weight loss to suggest severe active disease that would merit treatment. I believe that her frustration with her low weight is related to her right yet this is however in today in clinic I advised her to eat calorie rich foods while keeping and not on sodium intake. She is to continue using her past and to participate in exercises as she is doing.  Hypoxemia requiring supplemental oxygen Continue using 2 L of oxygen with exercise.  Pulmonary nodule I showed her images from her CT today in clinic and we discussed this at  length. The fact that the 8 mm nodule that was seen in the December 2012 study has now disappeared and her current nodule has occurred in the right upper lobe is highly suggestive that this nodularity is due to MAI infection. We discussed the risks and possibly frustration of ongoing CT scans which are very likely to show waxing and waning nodules in the setting of her known MAI disease. After lengthy discussion she decided that she would prefer to forego further CT imaging. I explained to her that should her weight drop or she develop chest pain or hemoptysis we would need to revisit this.    Updated Medication List Outpatient Encounter Prescriptions as of 05/15/2012  Medication Sig Dispense Refill  . ciprofloxacin (CIPRO) 500 MG tablet 1 tablet twice daily the first wk of every month      . dorzolamide-timolol (COSOPT) 22.3-6.8 MG/ML ophthalmic solution Place 1 drop into the left eye every 12 (twelve) hours.       . flecainide (TAMBOCOR) 50 MG tablet Take 1 tablet (50 mg total) by mouth 2 (two) times daily.  60 tablet  11  . Fluticasone-Salmeterol (ADVAIR DISKUS) 250-50 MCG/DOSE AEPB 1 puff twice a day rinse mouth after each use  180 each  4  . latanoprost (XALATAN) 0.005 % ophthalmic solution Place 1 drop into the left eye At bedtime.      . levalbuterol (XOPENEX HFA) 45 MCG/ACT inhaler Inhale 2 puffs into the lungs every 4 (four) hours as needed.        Marland Kitchen levothyroxine (SYNTHROID, LEVOTHROID) 75 MCG tablet Take 1 tablet (75 mcg total) by mouth daily.  90 tablet  3  . mometasone (NASONEX) 50 MCG/ACT nasal spray Place 2 sprays into the nose daily as needed.      Marland Kitchen DISCONTD: mometasone (NASONEX) 50 MCG/ACT nasal spray Place 2 sprays into the nose daily.  17 g  2

## 2012-05-16 ENCOUNTER — Encounter: Payer: Self-pay | Admitting: Internal Medicine

## 2012-05-16 NOTE — Assessment & Plan Note (Signed)
This has been a stable interval for [place]. In fact she states she's doing better than she can remember in quite some time. I don't think she needs the Cipro anymore stool continue her on the Advair as prescribed. Review of her CT shows changes consistent with MAI she still produces 1/2 cup of sputum a day. I question whether or not she still has active MAI disease but I think she is reluctant to restart antibiotic therapy for that. She does not have a change in sputum production or ongoing fevers or progressive weight loss to suggest severe active disease that would merit treatment. I believe that her frustration with her low weight is related to her right yet this is however in today in clinic I advised her to eat calorie rich foods while keeping and not on sodium intake. She is to continue using her past and to participate in exercises as she is doing.

## 2012-05-16 NOTE — Assessment & Plan Note (Signed)
I showed her images from her CT today in clinic and we discussed this at length. The fact that the 8 mm nodule that was seen in the December 2012 study has now disappeared and her current nodule has occurred in the right upper lobe is highly suggestive that this nodularity is due to MAI infection. We discussed the risks and possibly frustration of ongoing CT scans which are very likely to show waxing and waning nodules in the setting of her known MAI disease. After lengthy discussion she decided that she would prefer to forego further CT imaging. I explained to her that should her weight drop or she develop chest pain or hemoptysis we would need to revisit this.

## 2012-05-16 NOTE — Assessment & Plan Note (Signed)
Continue using 2 L of oxygen with exercise.

## 2012-06-16 ENCOUNTER — Other Ambulatory Visit (INDEPENDENT_AMBULATORY_CARE_PROVIDER_SITE_OTHER): Payer: Medicare Other | Admitting: *Deleted

## 2012-06-16 ENCOUNTER — Telehealth: Payer: Self-pay | Admitting: *Deleted

## 2012-06-16 DIAGNOSIS — E785 Hyperlipidemia, unspecified: Secondary | ICD-10-CM

## 2012-06-16 DIAGNOSIS — Z0000 Encounter for general adult medical examination without abnormal findings: Secondary | ICD-10-CM

## 2012-06-16 LAB — LIPID PANEL
Cholesterol: 153 mg/dL (ref 0–200)
LDL Cholesterol: 94 mg/dL (ref 0–99)
Total CHOL/HDL Ratio: 4
VLDL: 17.4 mg/dL (ref 0.0–40.0)

## 2012-06-16 LAB — CBC WITH DIFFERENTIAL/PLATELET
Basophils Absolute: 0.1 10*3/uL (ref 0.0–0.1)
Basophils Relative: 0.6 % (ref 0.0–3.0)
Eosinophils Absolute: 0.1 10*3/uL (ref 0.0–0.7)
Hemoglobin: 12.1 g/dL (ref 12.0–15.0)
Lymphs Abs: 1.3 10*3/uL (ref 0.7–4.0)
MCHC: 32.6 g/dL (ref 30.0–36.0)
MCV: 95.4 fl (ref 78.0–100.0)
Monocytes Absolute: 0.8 10*3/uL (ref 0.1–1.0)
Neutro Abs: 7.8 10*3/uL — ABNORMAL HIGH (ref 1.4–7.7)
RBC: 3.87 Mil/uL (ref 3.87–5.11)
RDW: 14.7 % — ABNORMAL HIGH (ref 11.5–14.6)

## 2012-06-16 LAB — COMPREHENSIVE METABOLIC PANEL
ALT: 11 U/L (ref 0–35)
AST: 20 U/L (ref 0–37)
Alkaline Phosphatase: 70 U/L (ref 39–117)
BUN: 12 mg/dL (ref 6–23)
Calcium: 8.6 mg/dL (ref 8.4–10.5)
Chloride: 103 mEq/L (ref 96–112)
Creatinine, Ser: 0.8 mg/dL (ref 0.4–1.2)
Potassium: 3.8 mEq/L (ref 3.5–5.1)

## 2012-06-16 NOTE — Telephone Encounter (Signed)
Patient came in for labs this am, what labs do you want me to draw?

## 2012-06-16 NOTE — Telephone Encounter (Signed)
TSH, FAstign lipids,  CMET and CBC wi diff   thankyou

## 2012-06-21 ENCOUNTER — Ambulatory Visit (INDEPENDENT_AMBULATORY_CARE_PROVIDER_SITE_OTHER): Payer: Medicare Other | Admitting: Internal Medicine

## 2012-06-21 ENCOUNTER — Encounter: Payer: Self-pay | Admitting: Internal Medicine

## 2012-06-21 VITALS — BP 120/62 | HR 66 | Temp 97.8°F | Resp 16 | Ht 68.0 in | Wt 133.0 lb

## 2012-06-21 DIAGNOSIS — Z0000 Encounter for general adult medical examination without abnormal findings: Secondary | ICD-10-CM | POA: Diagnosis not present

## 2012-06-21 DIAGNOSIS — R5383 Other fatigue: Secondary | ICD-10-CM

## 2012-06-21 DIAGNOSIS — L309 Dermatitis, unspecified: Secondary | ICD-10-CM

## 2012-06-21 DIAGNOSIS — L259 Unspecified contact dermatitis, unspecified cause: Secondary | ICD-10-CM | POA: Diagnosis not present

## 2012-06-21 DIAGNOSIS — R5381 Other malaise: Secondary | ICD-10-CM | POA: Diagnosis not present

## 2012-06-21 MED ORDER — BETAMETHASONE VALERATE 0.1 % EX CREA: TOPICAL_CREAM | Freq: Two times a day (BID) | CUTANEOUS | Status: DC

## 2012-06-21 NOTE — Progress Notes (Signed)
The patient is here for annual Medicare wellness examination and management of other chronic and acute problems. She had a tick bite about a month ago and after considerable manipulation was able to remove the entire contents of the tick but has now developed a cyst on her right back side that the tumor dermatologist was unwilling to remove. She's been putting steroid cream on it. She has been having good days and bad days. She has days where she is incredibly fatigued and other days where she is able to participate in exercise classes and does not use her supplemental oxygen without oxygen because he gets in the way..   The risk factors are reflected in the social history.  The roster of all physicians providing medical care to patient - is listed in the Snapshot section of the chart.  Activities of daily living:  The patient is 100% independent in all ADLs: dressing, toileting, feeding as well as independent mobility  Home safety : The patient has smoke detectors in the home. They wear seatbelts.  There are no firearms at home. There is no violence in the home.   There is no risks for hepatitis, STDs or HIV. There is no   history of blood transfusion. They have no travel history to infectious disease endemic areas of the world.  The patient has seen their dentist in the last six month. They have seen their eye doctor in the last year. They admit to slight hearing difficulty with regard to whispered voices and some television programs.  They have deferred audiologic testing in the last year.  They do not  have excessive sun exposure. Discussed the need for sun protection: hats, long sleeves and use of sunscreen if there is significant sun exposure.   Diet: the importance of a healthy diet is discussed. They do have a healthy diet.  The benefits of regular aerobic exercise were discussed. She walks 4 times per week ,  20 minutes.   Depression screen: there are no signs or vegative symptoms of  depression- irritability, change in appetite, anhedonia, sadness/tearfullness.  Cognitive assessment: the patient manages all their financial and personal affairs and is actively engaged. They could relate day,date,year and events; recalled 2/3 objects at 3 minutes; performed clock-face test normally.  The following portions of the patient's history were reviewed and updated as appropriate: allergies, current medications, past family history, past medical history,  past surgical history, past social history  and problem list.  Visual acuity was not assessed per patient preference since she has regular follow up with her ophthalmologist. Hearing and body mass index were assessed and reviewed.   During the course of the visit the patient was educated and counseled about appropriate screening and preventive services including : fall prevention , diabetes screening, nutrition counseling, colorectal cancer screening, and recommended immunizations.  Patient ID: Hannah Kline, female   DOB: 12/10/1931, 78 y.o.   MRN: [id number]  Objective  BP 120/62  Pulse 66  Temp 97.8 F (36.6 C) (Oral)  Resp 16  Ht 5\' 8"  (1.727 m)  Wt 133 lb (60.328 kg)  BMI 20.22 kg/m2  SpO2 96%  General Appearance:    Alert, cooperative, no distress, appears stated age  Head:    Normocephalic, without obvious abnormality, atraumatic  Eyes:    PERRL, conjunctiva/corneas clear, EOM's intact, fundi    benign, both eyes  Ears:    Normal TM's and external ear canals, both ears  Nose:   Nares normal, septum midline, mucosa  normal, no drainage    or sinus tenderness  Throat:   Lips, mucosa, and tongue normal; teeth and gums normal  Neck:   Supple, symmetrical, trachea midline, no adenopathy;    thyroid:  no enlargement/tenderness/nodules; no carotid   bruit or JVD  Back:     Symmetric, no curvature, ROM normal, no CVA tenderness  Lungs:     Clear to auscultation bilaterally, respirations unlabored  Chest Wall:    No tenderness  or deformity   Heart:    Regular rate and rhythm, S1 and S2 normal, no murmur, rub   or gallop  Breast Exam:    No tenderness, masses, or nipple abnormality  Abdomen:     Soft, non-tender, bowel sounds active all four quadrants,    no masses, no organomegaly  Genitalia:    Normal female without lesion, discharge or tenderness     Extremities:   Extremities normal, atraumatic, no cyanosis or edema  Pulses:   2+ and symmetric all extremities  Skin:   Skin color, texture, turgor normal, no rashes or lesions  Lymph nodes:   Cervical, supraclavicular, and axillary nodes normal  Neurologic:   CNII-XII intact, normal strength, sensation and reflexes    Throughout        Assessment and Plan  Normal postmenopausal female exam. Screenign up to date.   Fatigue Episodic,  Suggested trying use of nocturnal supplemental 02 to see if it makes a difference.    Updated Medication List Outpatient Encounter Prescriptions as of 06/21/2012  Medication Sig Dispense Refill  . dorzolamide-timolol (COSOPT) 22.3-6.8 MG/ML ophthalmic solution Place 1 drop into the left eye every 12 (twelve) hours.       . flecainide (TAMBOCOR) 50 MG tablet Take 1 tablet (50 mg total) by mouth 2 (two) times daily.  60 tablet  11  . Fluticasone-Salmeterol (ADVAIR DISKUS) 250-50 MCG/DOSE AEPB 1 puff twice a day rinse mouth after each use  180 each  4  . latanoprost (XALATAN) 0.005 % ophthalmic solution Place 1 drop into the left eye At bedtime.      . levalbuterol (XOPENEX HFA) 45 MCG/ACT inhaler Inhale 2 puffs into the lungs every 4 (four) hours as needed.        Marland Kitchen levothyroxine (SYNTHROID, LEVOTHROID) 75 MCG tablet Take 1 tablet (75 mcg total) by mouth daily.  90 tablet  3  . mometasone (NASONEX) 50 MCG/ACT nasal spray Place 2 sprays into the nose daily as needed.      . betamethasone valerate (VALISONE) 0.1 % cream Apply topically 2 (two) times daily.  30 g  0

## 2012-06-21 NOTE — Assessment & Plan Note (Signed)
Episodic,  Suggested trying nocturnal 02 to see if it makes a difference.

## 2012-06-21 NOTE — Patient Instructions (Addendum)
Please check your records about when your last tetanus shot was received, and when your last colonoscopy was done.

## 2012-06-22 ENCOUNTER — Encounter: Payer: Self-pay | Admitting: Internal Medicine

## 2012-06-23 ENCOUNTER — Telehealth: Payer: Self-pay | Admitting: Internal Medicine

## 2012-06-23 NOTE — Telephone Encounter (Signed)
Thank you,.  Does need another TdaP

## 2012-06-23 NOTE — Telephone Encounter (Signed)
Patient is not due for Tdap, you only receive one Tdap and the td booster every 10 year after that. Patient just received Tdap 3 years ago.

## 2012-06-23 NOTE — Telephone Encounter (Signed)
Dr.Tullo wanted to know the dates of her last TDAP and Colonoscopy .Date of TDAP was 9.23.10 , last Colonoscopy 11.8.07.

## 2012-07-14 DIAGNOSIS — H4010X0 Unspecified open-angle glaucoma, stage unspecified: Secondary | ICD-10-CM | POA: Diagnosis not present

## 2012-08-06 ENCOUNTER — Other Ambulatory Visit: Payer: Self-pay | Admitting: Cardiovascular Disease

## 2012-08-07 ENCOUNTER — Other Ambulatory Visit: Payer: Self-pay | Admitting: *Deleted

## 2012-08-07 ENCOUNTER — Encounter: Payer: Self-pay | Admitting: Pulmonary Disease

## 2012-08-07 ENCOUNTER — Ambulatory Visit (INDEPENDENT_AMBULATORY_CARE_PROVIDER_SITE_OTHER): Payer: Medicare Other | Admitting: Pulmonary Disease

## 2012-08-07 ENCOUNTER — Telehealth: Payer: Self-pay | Admitting: Pulmonary Disease

## 2012-08-07 VITALS — BP 106/60 | HR 73 | Temp 97.5°F | Ht 68.0 in | Wt 130.0 lb

## 2012-08-07 DIAGNOSIS — Z23 Encounter for immunization: Secondary | ICD-10-CM | POA: Diagnosis not present

## 2012-08-07 DIAGNOSIS — J479 Bronchiectasis, uncomplicated: Secondary | ICD-10-CM | POA: Diagnosis not present

## 2012-08-07 DIAGNOSIS — R042 Hemoptysis: Secondary | ICD-10-CM | POA: Diagnosis not present

## 2012-08-07 MED ORDER — FLECAINIDE ACETATE 50 MG PO TABS: 50.0000 mg | ORAL_TABLET | Freq: Two times a day (BID) | ORAL | Status: DC

## 2012-08-07 NOTE — Telephone Encounter (Signed)
Refilled Flecainide. 

## 2012-08-07 NOTE — Telephone Encounter (Signed)
I spoke with pt spouse and he stated pt was coughing up some blood last week. Requesting to see BQ this week. Spouse scheduled pt an appt today in [place] with BQ at 2:30. He stated he will make pt aware of this. Nothing further was needed

## 2012-08-07 NOTE — Assessment & Plan Note (Signed)
I think that the most likely cause here is a flare for bronchiectasis but I am concerned by the fact that she did not have preceding cough or sputum production out of proportion to the ordinary.  Because she had a train ride from [place] to [place] a day prior I think she is at risk for having a pulmonary embolism.  Plan: -She'll have a CT scan with contrast to evaluate for pulmonary embolism. -If that is positive I will have her admitted to [facility] long hospital in [place] where I will care for her -If the CT is negative for pulmonary bolus him and we will treat her for 10 days of ciprofloxacin 750 mg by mouth twice a day (she was given a prescription for this today)

## 2012-08-07 NOTE — Progress Notes (Signed)
Subjective:    Patient ID: Hannah Kline, female    DOB: [birth year], 78 y.o.   MRN: [id number] Synopsis: Hannah Kline established care with the [facility] pulmonary office in February 2013 for bronchiectasis due to MAI. She was diagnosed at age 28 in [place]. She was treated with rifampin ethambutol and clarithromycin for 2 years. Since then she does not believe she was ever grown out MAI.   HPI  7/8 routine office visit--Hannah Kline says that she's doing quite well and is now exercising 5 days a week. She is using pulse oximetry and states that usually during her exercise routine her oxygen stays above 92% on room air. However sometimes with her weight routine it drops below 88%. She takes albuterol before going to class and this helps quite a bit. She does not think that the Cipro has helped and she would like to stop. She continues to cough daily often getting up one half cup of sputum per day. It is typically pink but lately it has been yellow. She denies fevers chills. She is frustrated that her weight will not go up from 130 pounds.  08/07/12 Sick visit -- Hannah Kline comes to clinic today because last week she developed hemoptysis while visiting [place]. She states that she took a train from [place] to [place] on Monday and and felt some degree of shortness of breath while walking around in town. On Tuesday evening after dinner she felt the need to clear her throat and then noticed that she had a mouthful of bright red blood. She had cough productive of bright red blood for several days thereafter and then this eventually turned into a peak colored sputum. At the time she had a chill but no chest pain. She denies leg pain or swelling. She states that today her sputum is clear. Otherwise she has not had an increase in sputum production. She thinks that the Nasonex has helped with her cough.   Past Medical History  Diagnosis Date  . Atrial fibrillation     . History of Mycobacterium avium complex infection     lung disease  . COPD (chronic obstructive pulmonary disease)    . Bronchiectasis   . Hepatitis     h/o INH  . Bacterial infection due to Pseudomonas   . Pleurisy 12/08/11     Review of Systems  Constitutional: Negative for fever, chills, fatigue and unexpected weight change.  HENT: Negative for ear pain, nosebleeds and congestion.   Respiratory: Positive for cough and shortness of breath. Negative for choking.   Cardiovascular: Negative for chest pain and leg swelling.       Objective:   Physical Exam  There were no vitals filed for this visit. Gen: well appearing, no acute distress HEENT: NCAT, PERRL, EOMi, OP clear, neck supple without masses PULM: Insp crackles worse in upper lobes than bases, rhonchi noted CV: RRR, no mgr, no JVD AB: BS+, soft, nontender, no hsm Ext: warm, no edema, no clubbing, no cyanosis  Review of 2012 sputum microbiology: Pan sensitive pseudomonas  09/2010 Full PFT: Ratio 63%, FEV1 1.49L (67% pred)  10/2011 CT Chest: impressive upper lobe cystic bronchiectasis, scattered lower lobe tree-in-bud abnormalities and scattered nodules; One RML solid nodule measures 7.49mm  January 2013 CT chest [facility]: Stable upper lobe cystic bronchiectasis also present in the right middle lobe. Scattered tree in bud abnormalities and nodules throughout all lung fields roughly unchanged from prior. Right middle lobe nodule is  no longer visible but there is an 8mm nodule in the right upper lobe.    Assessment & Plan:   Hemoptysis I think that the most likely cause here is a flare for bronchiectasis but I am concerned by the fact that she did not have preceding cough or sputum production out of proportion to the ordinary.  Because she had a train ride from [place] to [place] a day prior I think she is at risk for having a pulmonary embolism.  Plan: -She'll have a CT scan with contrast to evaluate for pulmonary  embolism. -If that is positive I will have her admitted to [facility] long hospital in [place] where I will care for her -If the CT is negative for pulmonary bolus him and we will treat her for 10 days of ciprofloxacin 750 mg by mouth twice a day (she was given a prescription for this today)    Updated Medication List Outpatient Encounter Prescriptions as of 08/07/2012  Medication Sig Dispense Refill  . betamethasone valerate (VALISONE) 0.1 % cream Apply topically 2 (two) times daily.  30 g  0  . dorzolamide-timolol (COSOPT) 22.3-6.8 MG/ML ophthalmic solution Place 1 drop into the left eye every 12 (twelve) hours.       . flecainide (TAMBOCOR) 50 MG tablet TAKE 1 TABLET BY MOUTH TWICE A DAY  60 tablet  6  . flecainide (TAMBOCOR) 50 MG tablet Take 1 tablet (50 mg total) by mouth 2 (two) times daily.  60 tablet  11  . Fluticasone-Salmeterol (ADVAIR DISKUS) 250-50 MCG/DOSE AEPB 1 puff twice a day rinse mouth after each use  180 each  4  . latanoprost (XALATAN) 0.005 % ophthalmic solution Place 1 drop into the left eye At bedtime.      . levalbuterol (XOPENEX HFA) 45 MCG/ACT inhaler Inhale 2 puffs into the lungs every 4 (four) hours as needed.        Marland Kitchen levothyroxine (SYNTHROID, LEVOTHROID) 75 MCG tablet Take 1 tablet (75 mcg total) by mouth daily.  90 tablet  3  . mometasone (NASONEX) 50 MCG/ACT nasal spray Place 2 sprays into the nose daily as needed.

## 2012-08-07 NOTE — Patient Instructions (Signed)
We will have you get a CT scan of your chest tomorrow at [facility] in order to look for a blood clot. Call us for the results if you have not heard from [place] by 1:30.  We will decide then how to proceed. We will see you back as scheduled otherwise.

## 2012-08-08 ENCOUNTER — Ambulatory Visit: Payer: Self-pay | Admitting: Pulmonary Disease

## 2012-08-08 ENCOUNTER — Encounter: Payer: Self-pay | Admitting: Pulmonary Disease

## 2012-08-08 ENCOUNTER — Telehealth: Payer: Self-pay | Admitting: Pulmonary Disease

## 2012-08-08 DIAGNOSIS — Z1389 Encounter for screening for other disorder: Secondary | ICD-10-CM | POA: Diagnosis not present

## 2012-08-08 DIAGNOSIS — J479 Bronchiectasis, uncomplicated: Secondary | ICD-10-CM | POA: Diagnosis not present

## 2012-08-08 DIAGNOSIS — R918 Other nonspecific abnormal finding of lung field: Secondary | ICD-10-CM | POA: Diagnosis not present

## 2012-08-08 DIAGNOSIS — R0602 Shortness of breath: Secondary | ICD-10-CM | POA: Diagnosis not present

## 2012-08-08 DIAGNOSIS — J984 Other disorders of lung: Secondary | ICD-10-CM | POA: Diagnosis not present

## 2012-08-08 DIAGNOSIS — R042 Hemoptysis: Secondary | ICD-10-CM | POA: Diagnosis not present

## 2012-08-08 LAB — CREATININE, SERUM
Creatinine: 0.81 mg/dL (ref 0.60–1.30)
EGFR (African American): >60
EGFR (Non-African Amer.): >60

## 2012-08-08 NOTE — Telephone Encounter (Signed)
Spoke with pt and notified of results/recs per Dr. Kendrick Fries. She verbalized understanding and denied any questions.

## 2012-08-08 NOTE — Telephone Encounter (Signed)
Pt called back again. Wants to be called asap (she is a little anxious). Hannah Kline

## 2012-08-08 NOTE — Telephone Encounter (Signed)
Please let her know that her CT did not show a pulmonary embolism.  It showed a small area of infection which likely explains her hemoptysis.  She should take the cipro as we discussed and let me know if it doesn't get better.

## 2012-08-08 NOTE — Telephone Encounter (Signed)
Pt called again- requesting same. Hannah Kline

## 2012-08-08 NOTE — Telephone Encounter (Signed)
Pt is requesting results of CT. She was advised to call this afternoon. Please advise. Carron Curie, CMA

## 2012-08-21 ENCOUNTER — Telehealth: Payer: Self-pay | Admitting: Cardiovascular Disease

## 2012-08-21 NOTE — Telephone Encounter (Signed)
Pt says she had tachycardia all day yesterday, with rates around 150 BPM. She fell asleep like this and when she awoke this am, HR was back to normal at 55 BPM.  She denies associated symptoms of sob or dizziness.  She does c/o a "dull ache in chest" that has been present x 2 weeks.  She says, "Its nothing to worry about" and is not concerned about this.  She is more concerned about her tachycardia episodes she has on occasion. Last episode was 1 month ago. She confirms compliance with flecainide and says she even tried bystolic and digoxin yesterday to see if these would help rate.   She is due for 6 month f/u in December but wonders if she needs to be seen sooner. I told her I would discuss with Dr. Mariah Milling and call her back. Understanding verb.  She asks to try home # first then if no answer, may try cell # [phone]

## 2012-08-21 NOTE — Telephone Encounter (Signed)
Pt states she had a high heart beat last night and would like to be seen today if possible.  Pt has recall to schedule an appt in December.  Please call to advise.

## 2012-08-22 NOTE — Telephone Encounter (Signed)
LMTCB

## 2012-08-22 NOTE — Telephone Encounter (Signed)
Pt called back.  Informed pt of Dr. Windell Hummingbird suggestion.  Understanding verb.

## 2012-08-22 NOTE — Telephone Encounter (Signed)
Discussed with Dr. Mariah Milling. He suggests having pt continue as she is doing. He suggests she take an extra flecainide as needed for tachycardia and palpitations. If this continues, she may want to try increasing flecainide to 100 mg BID.  He says ok for pt to f/u in December.

## 2012-08-28 ENCOUNTER — Telehealth: Payer: Self-pay | Admitting: Pulmonary Disease

## 2012-08-28 MED ORDER — LEVALBUTEROL TARTRATE 45 MCG/ACT IN AERO: 2.0000 | INHALATION_SPRAY | RESPIRATORY_TRACT | Status: DC | PRN

## 2012-08-28 NOTE — Telephone Encounter (Signed)
Refill sent. Pt is aware. Jennifer Castillo, CMA  

## 2012-08-30 ENCOUNTER — Telehealth: Payer: Self-pay | Admitting: Pulmonary Disease

## 2012-08-30 ENCOUNTER — Other Ambulatory Visit: Payer: Self-pay | Admitting: *Deleted

## 2012-08-30 MED ORDER — MOMETASONE FUROATE 50 MCG/ACT NA SUSP: 2.0000 | Freq: Every day | NASAL | Status: DC | PRN

## 2012-08-30 NOTE — Telephone Encounter (Signed)
RX was sent in earlier today 08/30/12 for nasonex. I advised pt of this and she needed nothing further

## 2012-08-31 ENCOUNTER — Telehealth: Payer: Self-pay | Admitting: Pulmonary Disease

## 2012-08-31 NOTE — Telephone Encounter (Signed)
Called jamie at [facility] and lmom for her to make her aware that the xopenex hfa is on the pts med list. If anything further needed jamie can call us back.  Will sign off of this message  As nothing further is needed.

## 2012-08-31 NOTE — Telephone Encounter (Signed)
LMTCB for Hannah Kline  

## 2012-09-01 MED ORDER — LEVALBUTEROL TARTRATE 45 MCG/ACT IN AERO: 2.0000 | INHALATION_SPRAY | RESPIRATORY_TRACT | Status: DC | PRN

## 2012-09-01 NOTE — Telephone Encounter (Signed)
Asher Muir called back.  Requests to have a nurse leave the verbal order for the medication on her voicemail.  Also, stated that she was able to eventually get order faxed to us-can fax back.  Requests this done ASAP.  Antionette Fairy

## 2012-09-01 NOTE — Telephone Encounter (Signed)
Called left detailed message on Jamie's voicemail for:  Xopenex HFA #3 inhalers inhale 2 puffs every 4 hours as needed with 3 refills.  BQ's NPI # also provided.  Josefa Half to call if anything further is needed.

## 2012-09-06 ENCOUNTER — Telehealth: Payer: Self-pay | Admitting: Pulmonary Disease

## 2012-09-06 NOTE — Telephone Encounter (Signed)
(  Continued from above...)  Pt states that she was charged by both [employer] and [employer].  Pt states she is not able to get a refund or return the meds.  She has double & was charged by both companies.  Pt states she knows it was our office that ordered this med by both companies.  Pt states that we are only supposed to use [facility] & never to use [employer] [facility] [employer] ever again to order her Xopenex/meds.  Pt is very upset about the amount of money that she had to spend for both Rx's.  Antionette Fairy

## 2012-09-06 NOTE — Telephone Encounter (Signed)
Spoke with patient-aware that we only sent RX to one place-[facility]; they are the ones that charges patients insurance. Pt will call them again.

## 2012-09-11 NOTE — Telephone Encounter (Signed)
Pt says she has been taking fleccainide x 10 months and has felt "terrible" the whole time. Says she knows she is in NSR but has had 2 breakthrough spells of atrial fib.  Says she is aware of Dr. Windell Hummingbird plan to increase the flecainide to 100 mg BID, but she does not want to do this and wants to discuss stopping flecainide.   She is d/t see Dr. Mariah Milling in December. I explained we could see her this month, even as soon as tomm or Thursday She declines these days, says she already has prior engagements Scheduled her with Dr. Mariah Milling for next Thursday 11/14 She will call us should she feel she need to be seen sooner with another MD She asks if ok to stop the flecainide. I advised against this, explained the reason she is probably in NSR is b/c of the flecainide. She asks if ok to try just 1 tablet daily. I advised ok to try but should let us know should she have recurrent atrial fib/feel worse. Understanding verb.

## 2012-09-11 NOTE — Telephone Encounter (Signed)
Pt called stating that she has not been felling well for about a month. Pt thinks her flecainide is causing this.

## 2012-09-11 NOTE — Telephone Encounter (Signed)
lmtcb

## 2012-09-13 DIAGNOSIS — R0602 Shortness of breath: Secondary | ICD-10-CM | POA: Diagnosis not present

## 2012-09-13 DIAGNOSIS — R5381 Other malaise: Secondary | ICD-10-CM | POA: Diagnosis not present

## 2012-09-14 ENCOUNTER — Encounter: Payer: Self-pay | Admitting: Pulmonary Disease

## 2012-09-14 DIAGNOSIS — Z85820 Personal history of malignant melanoma of skin: Secondary | ICD-10-CM | POA: Diagnosis not present

## 2012-09-14 DIAGNOSIS — L820 Inflamed seborrheic keratosis: Secondary | ICD-10-CM | POA: Diagnosis not present

## 2012-09-14 DIAGNOSIS — L819 Disorder of pigmentation, unspecified: Secondary | ICD-10-CM | POA: Diagnosis not present

## 2012-09-14 DIAGNOSIS — L570 Actinic keratosis: Secondary | ICD-10-CM | POA: Diagnosis not present

## 2012-09-14 DIAGNOSIS — Z85828 Personal history of other malignant neoplasm of skin: Secondary | ICD-10-CM | POA: Diagnosis not present

## 2012-09-14 DIAGNOSIS — L821 Other seborrheic keratosis: Secondary | ICD-10-CM | POA: Diagnosis not present

## 2012-09-15 DIAGNOSIS — E559 Vitamin D deficiency, unspecified: Secondary | ICD-10-CM | POA: Diagnosis not present

## 2012-09-15 DIAGNOSIS — R5381 Other malaise: Secondary | ICD-10-CM | POA: Diagnosis not present

## 2012-09-15 DIAGNOSIS — E039 Hypothyroidism, unspecified: Secondary | ICD-10-CM | POA: Diagnosis not present

## 2012-09-15 DIAGNOSIS — R7989 Other specified abnormal findings of blood chemistry: Secondary | ICD-10-CM | POA: Diagnosis not present

## 2012-09-15 DIAGNOSIS — D509 Iron deficiency anemia, unspecified: Secondary | ICD-10-CM | POA: Diagnosis not present

## 2012-09-18 ENCOUNTER — Telehealth: Payer: Self-pay | Admitting: Internal Medicine

## 2012-09-18 MED ORDER — LEVOTHYROXINE SODIUM 75 MCG PO TABS: 75.0000 ug | ORAL_TABLET | Freq: Every day | ORAL | Status: DC

## 2012-09-18 NOTE — Telephone Encounter (Signed)
Refill request for levothyroxine 75 mcg #90 3 R  sent electronic to [facility] mail.

## 2012-09-18 NOTE — Telephone Encounter (Signed)
Refill request for levothyroxine 75 mcg tablet Sig: take 1 tablet every day

## 2012-09-19 ENCOUNTER — Ambulatory Visit (INDEPENDENT_AMBULATORY_CARE_PROVIDER_SITE_OTHER): Payer: Medicare Other | Admitting: Internal Medicine

## 2012-09-19 ENCOUNTER — Encounter: Payer: Self-pay | Admitting: Internal Medicine

## 2012-09-19 VITALS — BP 132/64 | HR 72 | Temp 97.6°F | Ht 68.5 in | Wt 125.5 lb

## 2012-09-19 DIAGNOSIS — R5381 Other malaise: Secondary | ICD-10-CM | POA: Diagnosis not present

## 2012-09-19 DIAGNOSIS — E538 Deficiency of other specified B group vitamins: Secondary | ICD-10-CM

## 2012-09-19 DIAGNOSIS — R05 Cough: Secondary | ICD-10-CM

## 2012-09-19 DIAGNOSIS — R5383 Other fatigue: Secondary | ICD-10-CM | POA: Diagnosis not present

## 2012-09-19 DIAGNOSIS — R059 Cough, unspecified: Secondary | ICD-10-CM

## 2012-09-19 NOTE — Progress Notes (Signed)
Patient ID: Hannah Kline, female   DOB: 29-May-1932, 78 y.o.   MRN: [id number]  Patient Active Problem List  Diagnosis  . 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 ATRIAL FIBRILLATION  . BRONCHIECTASIS  . C O P D  . Fatigue  . Bacterial infection due to Pseudomonas  . Screening for colon cancer  . Screening for breast cancer  . Chronic sinusitis  . Pulmonary nodule  . Hypoxemia requiring supplemental oxygen  . Hemoptysis    Subjective:  CC:   Chief Complaint  Patient presents with  . Fatigue    weakness and shaky    HPI:   Hannah Kline a 78 y.o. female who presents with Malaise and fatigue. She reports feeling very weak lately. Symptoms are aggravated by eating because her go lie down. She has had a home oxygen level has been fine at 95-96 despite feeling short of breath a lot..  she has a poor appetite and has continued to lose weight. She reports indigestion and gas. She  Moves bowels 2 or 3 times daily mostly formed stools.  Has d ry mouth,  feeling cold all the time, has  post nasal drip aggravating her cough . She was Concerned that the flecainide is the case,  So after speaking with her cardiologist Dr. Mariah Milling, she tapered off of flecainide with no change in symptoms.     Past Medical History  Diagnosis Date  . Atrial fibrillation   . History of Mycobacterium avium complex infection     lung disease  . COPD (chronic obstructive pulmonary disease)    . Bronchiectasis   . Hepatitis     h/o INH  . Bacterial infection due to Pseudomonas   . Pleurisy 12/08/11    Past Surgical History  Procedure Date  . Shoulder arthroscopy     right         The following portions of the patient's history were reviewed and updated as appropriate: Allergies, current medications, and problem list.    Review of Systems:   12 Pt  review of systems was negative except those addressed in the HPI,     History   Social History  . Marital Status: Married    Spouse Name:  N/A    Number of Children: N/A  . Years of Education: N/A   Occupational History  . Not on file.   Social History Main Topics  . Smoking status: Never Smoker   . Smokeless tobacco: Never Used  . Alcohol Use: 0.0 - 0.5 oz/week    0-1 drink(s) per week  . Drug Use: No  . Sexually Active: Not on file   Other Topics Concern  . Not on file   Social History Narrative  . No narrative on file    Objective:  BP 132/64  Pulse 72  Temp 97.6 F (36.4 C) (Oral)  Ht 5' 8.5" (1.74 m)  Wt 125 lb 8 oz (56.926 kg)  BMI 18.80 kg/m2  SpO2 94%  General appearance: alert, cooperative and appears stated age Ears: normal TM's and external ear canals both ears Throat: lips, mucosa, and tongue normal; teeth and gums normal Neck: no adenopathy, no carotid bruit, supple, symmetrical, trachea midline and thyroid not enlarged, symmetric, no tenderness/mass/nodules Back: symmetric, no curvature. ROM normal. No CVA tenderness. Lungs: clear to auscultation bilaterally Heart: regular rate and rhythm, S1, S2 normal, no murmur, click, rub or gallop Abdomen: soft, non-tender; bowel sounds normal; no masses,  no organomegaly Pulses: 2+ and  symmetric Skin: Skin color, texture, turgor normal. No rashes or lesions Lymph nodes: Cervical, supraclavicular, and axillary nodes normal.  Assessment and Plan:  Fatigue Aggravated in recent weeks. She has bronchiectasis and chronic sputum production. She has noted no increase in this recently. She is more short of breath. However her ambulatory sats remain above 90% here in the office but her ambulatory heart rate is over 100.. Pulmonary blood work suggests current infection with an elevated white count. However I do not want to empirically treated with antibiotics. We'll await hypersensitivity to hypersensitivity profile. I have suggested that she should resume her flecainide to manage her ambulatory heart rate.    Updated Medication List Outpatient Encounter  Prescriptions as of 09/19/2012  Medication Sig Dispense Refill  . betamethasone valerate (VALISONE) 0.1 % cream Apply topically 2 (two) times daily.  30 g  0  . dorzolamide-timolol (COSOPT) 22.3-6.8 MG/ML ophthalmic solution Place 1 drop into the left eye every 12 (twelve) hours.       . flecainide (TAMBOCOR) 50 MG tablet Take 1 tablet (50 mg total) by mouth 2 (two) times daily.  60 tablet  11  . Fluticasone-Salmeterol (ADVAIR DISKUS) 250-50 MCG/DOSE AEPB 1 puff twice a day rinse mouth after each use  180 each  4  . latanoprost (XALATAN) 0.005 % ophthalmic solution Place 1 drop into the left eye At bedtime.      . levalbuterol (XOPENEX HFA) 45 MCG/ACT inhaler Inhale 2 puffs into the lungs every 4 (four) hours as needed.  3 Inhaler  3  . levothyroxine (SYNTHROID, LEVOTHROID) 75 MCG tablet Take 1 tablet (75 mcg total) by mouth daily.  90 tablet  3  . mometasone (NASONEX) 50 MCG/ACT nasal spray Place 2 sprays into the nose daily as needed.  17 g  5     Orders Placed This Encounter  Procedures  . Vitamin B12  . CBC with Differential  . Comprehensive metabolic panel  . TSH  . Hypersensitivity pnuemonitis profile    No Follow-up on file.

## 2012-09-19 NOTE — Patient Instructions (Addendum)
If your blood work is all normal,  I do suggest that we try treating your with mirtazipine for depression and low energy

## 2012-09-20 ENCOUNTER — Other Ambulatory Visit: Payer: Self-pay

## 2012-09-20 LAB — COMPREHENSIVE METABOLIC PANEL
ALT: 14 U/L (ref 0–35)
AST: 20 U/L (ref 0–37)
Albumin: 3.2 g/dL — ABNORMAL LOW (ref 3.5–5.2)
Alkaline Phosphatase: 75 U/L (ref 39–117)
BUN: 14 mg/dL (ref 6–23)
Calcium: 8.7 mg/dL (ref 8.4–10.5)
Chloride: 101 mEq/L (ref 96–112)
Creatinine, Ser: 0.8 mg/dL (ref 0.4–1.2)
Potassium: 5 mEq/L (ref 3.5–5.1)

## 2012-09-20 LAB — CBC WITH DIFFERENTIAL/PLATELET
Basophils Absolute: 0.1 10*3/uL (ref 0.0–0.1)
Basophils Relative: 0.6 % (ref 0.0–3.0)
Eosinophils Absolute: 0.2 10*3/uL (ref 0.0–0.7)
Lymphocytes Relative: 8.5 % — ABNORMAL LOW (ref 12.0–46.0)
MCHC: 32.6 g/dL (ref 30.0–36.0)
MCV: 93.7 fl (ref 78.0–100.0)
Monocytes Absolute: 1.2 10*3/uL — ABNORMAL HIGH (ref 0.1–1.0)
Neutrophils Relative %: 81.6 % — ABNORMAL HIGH (ref 43.0–77.0)
RDW: 14.1 % (ref 11.5–14.6)

## 2012-09-22 ENCOUNTER — Ambulatory Visit (INDEPENDENT_AMBULATORY_CARE_PROVIDER_SITE_OTHER): Payer: Medicare Other | Admitting: Cardiovascular Disease

## 2012-09-22 ENCOUNTER — Encounter: Payer: Self-pay | Admitting: Cardiovascular Disease

## 2012-09-22 VITALS — BP 120/60 | HR 74 | Ht 68.0 in | Wt 126.5 lb

## 2012-09-22 DIAGNOSIS — R5381 Other malaise: Secondary | ICD-10-CM

## 2012-09-22 DIAGNOSIS — R0602 Shortness of breath: Secondary | ICD-10-CM | POA: Diagnosis not present

## 2012-09-22 DIAGNOSIS — R5383 Other fatigue: Secondary | ICD-10-CM

## 2012-09-22 DIAGNOSIS — R634 Abnormal weight loss: Secondary | ICD-10-CM

## 2012-09-22 DIAGNOSIS — I4891 Unspecified atrial fibrillation: Secondary | ICD-10-CM

## 2012-09-22 DIAGNOSIS — I951 Orthostatic hypotension: Secondary | ICD-10-CM | POA: Diagnosis not present

## 2012-09-22 NOTE — Assessment & Plan Note (Signed)
Systolic blood pressure dropping 30 points on today's office visit. Mild recovery after several minutes up to systolic 104. We have encouraged her to increase her fluids, salt intake. She could wear compression hose. This is likely from recent weight loss. We have offered Florinef. She would like to try increasing salt intake first. She does have some weakness after eating possibly from lower blood pressure. Sometimes even midodrine might help with the symptoms. She will call us with some blood pressure measurements and if symptoms get worse.

## 2012-09-22 NOTE — Progress Notes (Signed)
Patient ID: Hannah Kline, female    DOB: January 12, 1932, 78 y.o.   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  In the past, she has failed sotalol. Amiodarone was discontinued given underlying lung disease. She was started on  Flecainide 50 mg twice a day with good success.  She does report having 2 episodes of tachycardia since her last clinic visit . She took digoxin and beta blocker. Symptoms lasted all day . It resolved by the morning after a good night sleep . She has stopped exercising in the past several weeks as she has not been feeling well. She feels cold, weak. She has some postnasal drip. She is worried about mold that was found in her house. They have treated her air conditioning ducting. She found mold on one of her chairs.  Most alarming to her is her recent weight loss from 134 pounds on her last clinic visit June 2013, now 126 pounds. She does not have a good appetite. She does report having antibiotics several months ago for flareup of her bronchiectasis. With exertion, her oxygenation dropped into the 80s. At rest it is in the 90s. She does have a vest to break up secretions. She does have bouts of coughing daily to clear her lungs.  Episodes of tachycardia in July 2013, October 2013. Details as mentioned above  In the office today, blood pressure supine 124/76, sitting 107/68, standing 92/64. Heart rate increased from 68-87 with standing. Mild improvement in systolic pressure after several minutes with standing to 104 systolic.  EKG shows Normal sinus rhythm with rate 74 beats per minute with incomplete right bundle branch block, left anterior fascicular block    Outpatient Encounter Prescriptions as of 09/22/2012  Medication Sig Dispense Refill  .  dorzolamide-timolol (COSOPT) 22.3-6.8 MG/ML ophthalmic solution Place 1 drop into the left eye every 12 (twelve) hours.       . flecainide (TAMBOCOR) 50 MG tablet Take 1 tablet (50 mg total) by mouth 2 (two) times daily.  60 tablet  11  . Fluticasone-Salmeterol (ADVAIR DISKUS) 250-50 MCG/DOSE AEPB 1 puff twice a day rinse mouth after each use  180 each  4  . latanoprost (XALATAN) 0.005 % ophthalmic solution Place 1 drop into the left eye At bedtime.      . levalbuterol (XOPENEX HFA) 45 MCG/ACT inhaler Inhale 2 puffs into the lungs every 4 (four) hours as needed.  3 Inhaler  3  . levothyroxine (SYNTHROID, LEVOTHROID) 75 MCG tablet Take 1 tablet (75 mcg total) by mouth daily.  90 tablet  3  . mometasone (NASONEX) 50 MCG/ACT nasal spray Place 2 sprays into the nose daily as needed.  17 g  5  . [DISCONTINUED] betamethasone valerate (VALISONE) 0.1 % cream Apply topically 2 (two) times daily.  30 g  0    Review of Systems  Constitutional: Positive for fatigue.  HENT: Negative.   Eyes: Negative.   Respiratory: Positive for cough and shortness of breath.   Cardiovascular: Negative.   Gastrointestinal: Negative.   Musculoskeletal: Negative.   Skin: Negative.   Neurological: Positive for weakness.  Hematological: Negative.   Psychiatric/Behavioral: Negative.   All other systems reviewed and are negative.     BP 120/60  Pulse 74  Ht 5\' 8"  (1.727 m)  Wt 126 lb  8 oz (57.38 kg)  BMI 19.23 kg/m2  Physical Exam  Nursing note and vitals reviewed. Constitutional: She is oriented to person, place, and time. She appears well-developed and well-nourished.  HENT:  Head: Normocephalic.  Nose: Nose normal.  Mouth/Throat: Oropharynx is clear and moist.  Eyes: Conjunctivae normal are normal. Pupils are equal, round, and reactive to light.  Neck: Normal range of motion. Neck supple. No JVD present.  Cardiovascular: Normal rate, regular rhythm, S1 normal, S2 normal, normal heart sounds and intact  distal pulses.  Exam reveals no gallop and no friction rub.   No murmur heard. Pulmonary/Chest: Effort normal. No respiratory distress. She has no wheezes. She has rales. She exhibits no tenderness.  Abdominal: Soft. Bowel sounds are normal. She exhibits no distension. There is no tenderness.  Musculoskeletal: Normal range of motion. She exhibits no edema and no tenderness.  Lymphadenopathy:    She has no cervical adenopathy.  Neurological: She is alert and oriented to person, place, and time. Coordination normal.  Skin: Skin is warm and dry. No rash noted. No erythema.  Psychiatric: She has a normal mood and affect. Her behavior is normal. Judgment and thought content normal.         Assessment and Plan

## 2012-09-22 NOTE — Assessment & Plan Note (Signed)
Recent 8 pound weight loss since June 2013. We have encouraged her to increase her calorie intake. This will likely affect her blood pressure as we are seeing low pressures on today's visit.

## 2012-09-22 NOTE — Assessment & Plan Note (Addendum)
Aggravated in recent weeks. She has bronchiectasis and chronic sputum production. She has noted no increase in this recently. She is more short of breath. However her ambulatory sats remain above 90% here in the office but her ambulatory heart rate is over 100.. Pulmonary blood work suggests current infection with an elevated white count. However I do not want to empirically treated with antibiotics. We'll await hypersensitivity to hypersensitivity profile. I have suggested that she should resume her flecainide to manage her ambulatory heart rate.

## 2012-09-22 NOTE — Addendum Note (Signed)
Addended by: Sherlene Shams on: 09/22/2012 12:42 PM   Modules accepted: Orders

## 2012-09-22 NOTE — Assessment & Plan Note (Signed)
I'm concerned about her recent weight loss. She is orthostatic on today's visit. Unable to exclude depression.

## 2012-09-22 NOTE — Patient Instructions (Addendum)
No medication changes were made.  Add salt to your food Continue fluid intake Try to get your weight up  If you continue to have low blood pressure, call the office for a medication to raise the blood pressure  Please call us if you have new issues that need to be addressed before your next appt.  Your physician wants you to follow-up in: 6 months.  You will receive a reminder letter in the mail two months in advance. If you don't receive a letter, please call our office to schedule the follow-up appointment.

## 2012-09-22 NOTE — Assessment & Plan Note (Signed)
2 episodes of tachycardia, one in July, 1 in October of this year. Lasting 12 hours. Uncertain if this was atrial fibrillation. We have suggested she try to come to the office for EKG when she has these rhythms again. Previous episodes were on a Friday night and Sunday. She could take extra flecainide for arrhythmia. She has taken when necessary digoxin and beta blocker which would also help. Holding her flecainide did not help her feel any better and she restarted the medication.

## 2012-09-25 ENCOUNTER — Ambulatory Visit (INDEPENDENT_AMBULATORY_CARE_PROVIDER_SITE_OTHER)
Admission: RE | Admit: 2012-09-25 | Discharge: 2012-09-25 | Disposition: A | Discharge status: Home / Self Care | Payer: Medicare Other | Source: Ambulatory Visit | Attending: Internal Medicine | Admitting: Internal Medicine

## 2012-09-25 DIAGNOSIS — R5383 Other fatigue: Secondary | ICD-10-CM

## 2012-09-25 DIAGNOSIS — R5381 Other malaise: Secondary | ICD-10-CM | POA: Diagnosis not present

## 2012-09-25 DIAGNOSIS — J479 Bronchiectasis, uncomplicated: Secondary | ICD-10-CM | POA: Diagnosis not present

## 2012-09-25 DIAGNOSIS — R05 Cough: Secondary | ICD-10-CM | POA: Diagnosis not present

## 2012-09-26 ENCOUNTER — Other Ambulatory Visit: Payer: Self-pay

## 2012-09-26 ENCOUNTER — Other Ambulatory Visit: Payer: Self-pay | Admitting: Internal Medicine

## 2012-09-26 DIAGNOSIS — R5381 Other malaise: Secondary | ICD-10-CM | POA: Diagnosis not present

## 2012-09-26 DIAGNOSIS — R5383 Other fatigue: Secondary | ICD-10-CM | POA: Diagnosis not present

## 2012-09-26 LAB — HYPERSENSITIVITY PNUEMONITIS PROFILE

## 2012-09-26 MED ORDER — LEVOTHYROXINE SODIUM 75 MCG PO TABS: 75.0000 ug | ORAL_TABLET | Freq: Every day | ORAL | Status: DC

## 2012-09-26 MED ORDER — DOXYCYCLINE HYCLATE 100 MG PO TBEC: 100.0000 mg | DELAYED_RELEASE_TABLET | Freq: Two times a day (BID) | ORAL | Status: DC

## 2012-09-26 NOTE — Progress Notes (Signed)
Patient notified of Rx doxycyline sent to [facility].

## 2012-09-26 NOTE — Telephone Encounter (Signed)
Levothyroxine 75 mg sent electronic to [facility].

## 2012-09-27 ENCOUNTER — Telehealth: Payer: Self-pay | Admitting: Internal Medicine

## 2012-09-27 NOTE — Telephone Encounter (Signed)
Message copied by Sherlene Shams on Wed Sep 27, 2012  5:49 PM ------      Message from: Max Fickle B      Created: Wed Sep 27, 2012 11:42 AM       A sputum culture would be really helpful too.            ----- Message -----         From: Sherlene Shams, MD         Sent: 09/26/2012   8:54 PM           To: Lupita Leash, MD            Kipp Brood,            I checked a hypersensitivity panel on [facility] recently because she was having increased dyspnea, sputum , malaise and had a mild leukocytosis. The aspergillus was positive (one band) . Do you recommend any action?            TT

## 2012-09-27 NOTE — Telephone Encounter (Signed)
Dr Kendrick Fries would like her to come by and submit a sputum specimen for culture and make an appt with him.  I discussed her symptoms with him .  Please have her submit one and get her a follow up with him.

## 2012-09-28 ENCOUNTER — Other Ambulatory Visit: Payer: Medicare Other

## 2012-09-28 ENCOUNTER — Other Ambulatory Visit: Payer: Self-pay | Admitting: *Deleted

## 2012-09-28 DIAGNOSIS — R05 Cough: Secondary | ICD-10-CM

## 2012-09-28 NOTE — Telephone Encounter (Signed)
Spoke to patient via phone gave her instructions as directed.

## 2012-10-03 ENCOUNTER — Telehealth: Payer: Self-pay | Admitting: Pulmonary Disease

## 2012-10-03 NOTE — Telephone Encounter (Signed)
I left a message for Hannah Kline letting her know we have clinic visits available today if she wants to come in.

## 2012-10-10 ENCOUNTER — Encounter: Payer: Self-pay | Admitting: Pulmonary Disease

## 2012-10-10 ENCOUNTER — Ambulatory Visit (INDEPENDENT_AMBULATORY_CARE_PROVIDER_SITE_OTHER): Payer: Medicare Other | Admitting: Pulmonary Disease

## 2012-10-10 VITALS — BP 106/60 | HR 73 | Temp 97.4°F | Ht 68.0 in | Wt 127.4 lb

## 2012-10-10 DIAGNOSIS — J309 Allergic rhinitis, unspecified: Secondary | ICD-10-CM | POA: Diagnosis not present

## 2012-10-10 DIAGNOSIS — J479 Bronchiectasis, uncomplicated: Secondary | ICD-10-CM

## 2012-10-10 DIAGNOSIS — J310 Chronic rhinitis: Secondary | ICD-10-CM | POA: Insufficient documentation

## 2012-10-10 MED ORDER — TOBRAMYCIN 300 MG/4ML IN NEBU: 300.0000 mg | INHALATION_SOLUTION | Freq: Two times a day (BID) | RESPIRATORY_TRACT | Status: DC

## 2012-10-10 MED ORDER — CIPROFLOXACIN HCL 750 MG PO TABS: 750.0000 mg | ORAL_TABLET | Freq: Two times a day (BID) | ORAL | Status: AC

## 2012-10-10 NOTE — Patient Instructions (Signed)
Give [place] the two sputum specimens by tomorrow. Start taking the cipro tomorrow Use saline rinses at home to help with the sinus symptoms You can use over the counter anti-histamines such as zyrtec to help with the sinus symptoms Let us know if they find mold in your house Start using the tobramycin inhaled twice a day We will see you back in a week

## 2012-10-10 NOTE — Assessment & Plan Note (Addendum)
Hannah Kline's weight loss, dyspnea, chills, decrease in lung function, and increase sputum production are characteristic of a flare of her bronchiectasis.  I'm surprised that the recent Cipro didn't help given her known pan sensitive pseudomonas.  The differential diagnosis here includes allergic bronchopulmonary aspergillosis and recurrent MAI.  Hopefully we're not dealing with either.  The recent HP panel was positive for aspergillus organisms, but to make a diagnosis of ABPA we would need to see a high IgE or IgE specific to aspergillus.  Hypersensitivity pneumonitis is an interesting possibility given her mold in the home and improvement after leaving the house.  Typically we would expect to see more hypoxemia, more dyspnea, and centrilobular nodules in an upper lobe pattern on CT. Her recent CT at [facility] did not show this, so in general I think this is less likely.    Plan: -check total serum IgE and IgE specific to aspergillus -check sputum for AFB, fungal, and repeat bacterial culture -start cipro after cultures are back -start tobi inhaled for a month, consider treating every other month indefinitely -check spirometry today

## 2012-10-10 NOTE — Progress Notes (Signed)
Subjective:    Patient ID: Hannah Kline, female    DOB: Jul 28, 1932, 78 y.o.   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 She was diagnosed at age 37 in [place]. She was treated with rifampin ethambutol and clarithromycin for 2 years. Since then she does not believe she was ever grown out MAI.   HPI  7/8 routine office visit--Hannah Kline says that she's doing quite well and is now exercising 5 days a week. She is using pulse oximetry and states that usually during her exercise routine her oxygen stays above 92% on room air. However sometimes with her weight routine it drops below 88%. She takes albuterol before going to class and this helps quite a bit. She does not think that the Cipro has helped and she would like to stop. She continues to cough daily often getting up one half cup of sputum per day. It is typically pink but lately it has been yellow. She denies fevers chills. She is frustrated that her weight will not go up from 130 pounds.  08/07/12 Sick visit -- Hannah Kline comes to clinic today because last week she developed hemoptysis while visiting [place]. She states that she took a train from [place] to [place] on Monday and and felt some degree of shortness of breath while walking around in town. On Tuesday evening after dinner she felt the need to clear her throat and then noticed that she had a mouthful of bright red blood. She had cough productive of bright red blood for several days thereafter and then this eventually turned into a peak colored sputum. At the time she had a chill but no chest pain. She denies leg pain or swelling. She states that today her sputum is clear. Otherwise she has not had an increase in sputum production. She thinks that the Nasonex has helped with her cough.  10/10/2012 ROV -- Hannah Kline has had a rough time. She did not have  improvement with the Cipro we gave her earlier in November. She has not had further episodes of coughing up blood but she has had increased sputum production. This is been associated with weight loss, fatigue, and chills. She has noticed mold in her home recently and a recent hypersensitivity pneumonitis panel showed some reaction to aspergillus species. She went to [place] for a week to visit her daughter around Thanksgiving and she said she felt much better while up there. She had less cough and shortness of breath.   Past Medical History  Diagnosis Date  . Atrial fibrillation   . History of Mycobacterium avium complex infection     lung disease  . COPD (chronic obstructive pulmonary disease)    . Bronchiectasis   . Hepatitis     h/o INH  . Bacterial infection due to Pseudomonas   . Pleurisy 12/08/11     Review of Systems  Constitutional: Negative for fever, chills, fatigue and unexpected weight change.  HENT: Negative for ear pain, nosebleeds and congestion.   Respiratory: Positive for cough and shortness of breath. Negative for choking.   Cardiovascular: Negative for chest pain and leg swelling.       Objective:   Physical Exam  There were no vitals filed for this visit. Gen: well appearing, no acute distress HEENT: NCAT, PERRL, EOMi, OP clear, neck supple without masses PULM: Insp crackles worse in upper lobes than bases, rhonchi  noted CV: RRR, no mgr, no JVD AB: BS+, soft, nontender, no hsm Ext: warm, no edema, no clubbing, no cyanosis  Review of 2012 sputum microbiology: Pan sensitive pseudomonas  09/2010 Full PFT: Ratio 63%, FEV1 1.49L (67% pred)  10/2011 CT Chest: impressive upper lobe cystic bronchiectasis, scattered lower lobe tree-in-bud abnormalities and scattered nodules; One RML solid nodule measures 7.27mm  January 2013 CT chest [facility]: Stable upper lobe cystic bronchiectasis also present in the right middle lobe. Scattered tree in bud abnormalities and  nodules throughout all lung fields roughly unchanged from prior. Right middle lobe nodule is no longer visible but there is an 8mm nodule in the right upper lobe.  December 2013 simple spirometry FEV1 0.8 L    Assessment & Plan:   BRONCHIECTASIS Jandi's weight loss, dyspnea, chills, decrease in lung function, and increase sputum production are characteristic of a flare of her bronchiectasis.  I'm surprised that the recent Cipro didn't help given her known pan sensitive pseudomonas.  The differential diagnosis here includes allergic bronchopulmonary aspergillosis and recurrent MAI.  Hopefully we're not dealing with either.  The recent HP panel was positive for aspergillus organisms, but to make a diagnosis of ABPA we would need to see a high IgE or IgE specific to aspergillus.  Hypersensitivity pneumonitis is an interesting possibility given her mold in the home and improvement after leaving the house.  Typically we would expect to see more hypoxemia, more dyspnea, and centrilobular nodules in an upper lobe pattern on CT. Her recent CT at [facility] did not show this, so in general I think this is less likely.    Plan: -check total serum IgE and IgE specific to aspergillus -check sputum for AFB, fungal, and repeat bacterial culture -start cipro after cultures are back -start tobi inhaled for a month, consider treating every other month indefinitely -check spirometry today  Allergic rhinitis Continue nasonex Start OTC non-sedating antihistamine Saline rinses   Return to clinic in one week  Updated Medication List Outpatient Encounter Prescriptions as of 10/10/2012  Medication Sig Dispense Refill  . dorzolamide-timolol (COSOPT) 22.3-6.8 MG/ML ophthalmic solution Place 1 drop into the left eye every 12 (twelve) hours.       Marland Kitchen doxycycline (DORYX) 100 MG EC tablet Take 1 tablet (100 mg total) by mouth 2 (two) times daily.  14 tablet  0  . flecainide (TAMBOCOR) 50 MG tablet Take 1 tablet (50 mg  total) by mouth 2 (two) times daily.  60 tablet  11  . Fluticasone-Salmeterol (ADVAIR DISKUS) 250-50 MCG/DOSE AEPB 1 puff twice a day rinse mouth after each use  180 each  4  . latanoprost (XALATAN) 0.005 % ophthalmic solution Place 1 drop into the left eye At bedtime.      . levalbuterol (XOPENEX HFA) 45 MCG/ACT inhaler Inhale 2 puffs into the lungs every 4 (four) hours as needed.  3 Inhaler  3  . levothyroxine (SYNTHROID, LEVOTHROID) 75 MCG tablet Take 1 tablet (75 mcg total) by mouth daily.  90 tablet  3  . mometasone (NASONEX) 50 MCG/ACT nasal spray Place 2 sprays into the nose daily as needed.  17 g  5

## 2012-10-10 NOTE — Assessment & Plan Note (Signed)
Continue nasonex Start OTC non-sedating antihistamine Saline rinses

## 2012-10-11 ENCOUNTER — Telehealth: Payer: Self-pay

## 2012-10-11 ENCOUNTER — Telehealth: Payer: Self-pay | Admitting: Pulmonary Disease

## 2012-10-11 DIAGNOSIS — J479 Bronchiectasis, uncomplicated: Secondary | ICD-10-CM | POA: Diagnosis not present

## 2012-10-11 DIAGNOSIS — J309 Allergic rhinitis, unspecified: Secondary | ICD-10-CM | POA: Diagnosis not present

## 2012-10-11 LAB — IGE: IgE (Immunoglobulin E), Serum: 3.1 IU/mL (ref 0.0–180.0)

## 2012-10-11 NOTE — Telephone Encounter (Signed)
I called Randy to let her know that she should start taking the Cipro now and that her IgE level was normal.

## 2012-10-11 NOTE — Telephone Encounter (Signed)
Pt returned with sputum samples today and asked about starting Cipro and requested a callback to advise.

## 2012-10-12 DIAGNOSIS — R5383 Other fatigue: Secondary | ICD-10-CM | POA: Diagnosis not present

## 2012-10-14 LAB — RESPIRATORY CULTURE OR RESPIRATORY AND SPUTUM CULTURE

## 2012-10-16 ENCOUNTER — Ambulatory Visit: Payer: Medicare Other | Admitting: Cardiovascular Disease

## 2012-10-17 ENCOUNTER — Encounter: Payer: Self-pay | Admitting: Pulmonary Disease

## 2012-10-17 ENCOUNTER — Ambulatory Visit (INDEPENDENT_AMBULATORY_CARE_PROVIDER_SITE_OTHER): Payer: Medicare Other | Admitting: Pulmonary Disease

## 2012-10-17 VITALS — BP 120/70 | HR 80 | Temp 97.7°F | Ht 68.0 in | Wt 124.0 lb

## 2012-10-17 DIAGNOSIS — R0902 Hypoxemia: Secondary | ICD-10-CM

## 2012-10-17 DIAGNOSIS — J479 Bronchiectasis, uncomplicated: Secondary | ICD-10-CM | POA: Diagnosis not present

## 2012-10-17 MED ORDER — CIPROFLOXACIN HCL 750 MG PO TABS: 750.0000 mg | ORAL_TABLET | Freq: Two times a day (BID) | ORAL | Status: DC

## 2012-10-17 NOTE — Progress Notes (Signed)
Subjective:    Patient ID: Hannah Kline, female    DOB: 09/25/1932, 78 y.o.   MRN: [id number] Synopsis: Hannah Kline established care with the [facility] pulmonary office in February 2013 for bronchiectasis due to MAI. She was diagnosed at age 51 in [place]. She was treated with rifampin ethambutol and clarithromycin for 2 years. Since then she does not believe she was ever grown out MAI.   HPI  7/8 routine office visit--Hannah Kline says that she's doing quite well and is now exercising 5 days a week. She is using pulse oximetry and states that usually during her exercise routine her oxygen stays above 92% on room air. However sometimes with her weight routine it drops below 88%. She takes albuterol before going to class and this helps quite a bit. She does not think that the Cipro has helped and she would like to stop. She continues to cough daily often getting up one half cup of sputum per day. It is typically pink but lately it has been yellow. She denies fevers chills. She is frustrated that her weight will not go up from 130 pounds.  08/07/12 Sick visit -- Hannah Kline comes to clinic today because last week she developed hemoptysis while visiting [place]. She states that she took a train from [place] to [place] on Monday and and felt some degree of shortness of breath while walking around in town. On Tuesday evening after dinner she felt the need to clear her throat and then noticed that she had a mouthful of bright red blood. She had cough productive of bright red blood for several days thereafter and then this eventually turned into a peak colored sputum. At the time she had a chill but no chest pain. She denies leg pain or swelling. She states that today her sputum is clear. Otherwise she has not had an increase in sputum production. She thinks that the Nasonex has helped with her cough.  10/10/2012 ROV -- Hannah Kline has had a rough time. She did not have  improvement with the Cipro we gave her earlier in November. She has not had further episodes of coughing up blood but she has had increased sputum production. This is been associated with weight loss, fatigue, and chills. She has noticed mold in her home recently and a recent hypersensitivity pneumonitis panel showed some reaction to aspergillus species. She went to [place] for a week to visit her daughter around Thanksgiving and she said she felt much better while up there. She had less cough and shortness of breath.  10/17/2012 ROV -- Hannah Kline is feeling a little better in that she has more energy.  She is still coughing, but not making as much sputum.  She is still somewhat short of breath, but again improved.  No fever, no chills.  No hemoptysis, no chest pain.  She is still taking the cipro.  She has not gotten the tobi yet.  Past Medical History  Diagnosis Date  . Atrial fibrillation   . History of Mycobacterium avium complex infection     lung disease  . COPD (chronic obstructive pulmonary disease)    . Bronchiectasis   . Hepatitis     h/o INH  . Bacterial infection due to Pseudomonas   . Pleurisy 12/08/11     Review of Systems  Constitutional: Negative for fever, chills, fatigue and unexpected weight change.  HENT: Negative for ear pain, nosebleeds and congestion.   Respiratory: Positive for cough  and shortness of breath. Negative for choking.   Cardiovascular: Negative for chest pain and leg swelling.       Objective:   Physical Exam  Filed Vitals:   10/17/12 1432  BP: 120/70  Pulse: 80  Temp: 97.7 F (36.5 C)  Height: 5\' 8"  (1.727 m)  Weight: 124 lb (56.246 kg)  SpO2: 95%   Gen: well appearing, no acute distress HEENT: NCAT, PERRL, EOMi, OP clear, neck supple without masses PULM: Insp crackles worse in upper lobes than bases, rhonchi noted CV: RRR, no mgr, no JVD AB: BS+, soft, nontender, no hsm Ext: warm, no edema, no clubbing, no cyanosis  Review of 2012  sputum microbiology: Pan sensitive pseudomonas  09/2010 Full PFT: Ratio 63%, FEV1 1.49L (67% pred)  10/2011 CT Chest: impressive upper lobe cystic bronchiectasis, scattered lower lobe tree-in-bud abnormalities and scattered nodules; One RML solid nodule measures 7.54mm  January 2013 CT chest [facility]: Stable upper lobe cystic bronchiectasis also present in the right middle lobe. Scattered tree in bud abnormalities and nodules throughout all lung fields roughly unchanged from prior. Right middle lobe nodule is no longer visible but there is an 8mm nodule in the right upper lobe.  December 2013 simple spirometry FEV1 0.8 L  10/11/2012 Sputum: PSEUDOMONAS AERUGINOSA       Antibiotic  Sensitivity  Microscan  Status    CEFEPIME  Sensitive  2  Final    CEFTAZIDIME  Sensitive  <=1  Final    CIPROFLOXACIN  Intermediate  2  Final    GENTAMICIN  Sensitive  <=1  Final    IMIPENEM  Sensitive  1  Final    LEVOFLOXACIN  Intermediate  4  Final    PIP/TAZO  Sensitive  <=4  Final    TOBRAMYCIN  Sensitive  <=1  Final         Assessment & Plan:   BRONCHIECTASIS PSEUDOMONAS AERUGINOSA       Antibiotic  Sensitivity  Microscan  Status    CEFEPIME  Sensitive  2  Final    CEFTAZIDIME  Sensitive  <=1  Final    CIPROFLOXACIN  Intermediate  2  Final    GENTAMICIN  Sensitive  <=1  Final    IMIPENEM  Sensitive  1  Final    LEVOFLOXACIN  Intermediate  4  Final    PIP/TAZO  Sensitive  <=4  Final    TOBRAMYCIN  Sensitive  <=1  Final    Hannah Kline is experiencing a flare of bronchiectasis due to the above organism.  She has grown out pseudomonas on many different occasions.  Though the species shows intermediate susceptibility to Cipro, she is improving clinically.  Because of the burden of her disease, her declining PFTs, I think she needs to be on chronic suppressive therapy.  She has not benefited from scheduled Cipro in the past, so I think a better option is inhaled TOBI every other month.  Plan: -complete 14  days of quinolone therapy (cipro) for bronchiectasis -start TOBI neb twice a day every other month; start on November 08, 2012  -eat as much as possible -Follow up with me in January  Hypoxemia requiring supplemental oxygen She continues to use and benefit from 2 L of O2 with exertion. Plan: continue    Updated Medication List Outpatient Encounter Prescriptions as of 10/17/2012  Medication Sig Dispense Refill  . ciprofloxacin (CIPRO) 750 MG tablet Take 1 tablet (750 mg total) by mouth 2 (two) times daily.  20 tablet  0  . dorzolamide-timolol (COSOPT) 22.3-6.8 MG/ML ophthalmic solution Place 1 drop into the left eye every 12 (twelve) hours.       . flecainide (TAMBOCOR) 50 MG tablet Take 1 tablet (50 mg total) by mouth 2 (two) times daily.  60 tablet  11  . Fluticasone-Salmeterol (ADVAIR DISKUS) 250-50 MCG/DOSE AEPB 1 puff twice a day rinse mouth after each use  180 each  4  . latanoprost (XALATAN) 0.005 % ophthalmic solution Place 1 drop into the left eye At bedtime.      . levalbuterol (XOPENEX HFA) 45 MCG/ACT inhaler Inhale 2 puffs into the lungs every 4 (four) hours as needed.  3 Inhaler  3  . levothyroxine (SYNTHROID, LEVOTHROID) 75 MCG tablet Take 1 tablet (75 mcg total) by mouth daily.  90 tablet  3  . mometasone (NASONEX) 50 MCG/ACT nasal spray Place 2 sprays into the nose daily as needed.  17 g  5  . Tobramycin 300 MG/4ML NEBU Inhale 300 mg into the lungs 2 (two) times daily.  224 mL  1

## 2012-10-17 NOTE — Assessment & Plan Note (Signed)
She continues to use and benefit from 2 L of O2 with exertion. Plan: continue

## 2012-10-17 NOTE — Patient Instructions (Signed)
When you complete the Cipro, start taking the levaquin for 5 more days We will work to get you inhaled tobramcyin ("Tobi") to take twice a day for a month Eat as much as possible over the next few weeks. Let us know if you are not getting better We will plan to see you in the office in January

## 2012-10-17 NOTE — Assessment & Plan Note (Addendum)
PSEUDOMONAS AERUGINOSA       Antibiotic  Sensitivity  Microscan  Status    CEFEPIME  Sensitive  2  Final    CEFTAZIDIME  Sensitive  <=1  Final    CIPROFLOXACIN  Intermediate  2  Final    GENTAMICIN  Sensitive  <=1  Final    IMIPENEM  Sensitive  1  Final    LEVOFLOXACIN  Intermediate  4  Final    PIP/TAZO  Sensitive  <=4  Final    TOBRAMYCIN  Sensitive  <=1  Final    Hannah Kline is experiencing a flare of bronchiectasis due to the above organism.  She has grown out pseudomonas on many different occasions.  Though the species shows intermediate susceptibility to Cipro, she is improving clinically.  Because of the burden of her disease, her declining PFTs, I think she needs to be on chronic suppressive therapy.  She has not benefited from scheduled Cipro in the past, so I think a better option is inhaled TOBI every other month.  Plan: -complete 14 days of quinolone therapy (cipro) for bronchiectasis -start TOBI neb twice a day every other month; start on November 08, 2012  -eat as much as possible -Follow up with me in January

## 2012-10-20 ENCOUNTER — Telehealth: Payer: Self-pay | Admitting: Pulmonary Disease

## 2012-10-20 NOTE — Telephone Encounter (Signed)
FYI for Dr. Claire Shown stated that there is still mold in her home and they will be out next week to treat this.  Nothing further is needed.

## 2012-10-24 NOTE — Telephone Encounter (Signed)
noted 

## 2012-11-14 ENCOUNTER — Telehealth: Payer: Self-pay | Admitting: Pulmonary Disease

## 2012-11-14 ENCOUNTER — Encounter: Payer: Self-pay | Admitting: Pulmonary Disease

## 2012-11-14 ENCOUNTER — Ambulatory Visit (INDEPENDENT_AMBULATORY_CARE_PROVIDER_SITE_OTHER): Payer: Medicare Other | Admitting: Pulmonary Disease

## 2012-11-14 ENCOUNTER — Other Ambulatory Visit: Payer: Self-pay | Admitting: Pulmonary Disease

## 2012-11-14 VITALS — BP 130/70 | HR 63 | Temp 98.1°F | Ht 68.0 in | Wt 124.0 lb

## 2012-11-14 DIAGNOSIS — J479 Bronchiectasis, uncomplicated: Secondary | ICD-10-CM

## 2012-11-14 DIAGNOSIS — J329 Chronic sinusitis, unspecified: Secondary | ICD-10-CM | POA: Diagnosis not present

## 2012-11-14 MED ORDER — TOBRAMYCIN 28 MG IN CAPS: 4.0000 | ORAL_CAPSULE | Freq: Two times a day (BID) | RESPIRATORY_TRACT | Status: DC

## 2012-11-14 NOTE — Progress Notes (Signed)
Subjective:    Patient ID: Hannah Kline, female    DOB: Jul 19, 1932, 79 y.o.   MRN: [id number] Synopsis: Hannah Kline established care with the [facility] pulmonary office in February 2013 for bronchiectasis due to MAI. She was diagnosed at age 49 in [place]. She was treated with rifampin ethambutol and clarithromycin for 2 years. Since then she does not believe she was ever grown out MAI.   HPI  7/8 routine office visit--Hannah Kline says that she's doing quite well and is now exercising 5 days a week. She is using pulse oximetry and states that usually during her exercise routine her oxygen stays above 92% on room air. However sometimes with her weight routine it drops below 88%. She takes albuterol before going to class and this helps quite a bit. She does not think that the Cipro has helped and she would like to stop. She continues to cough daily often getting up one half cup of sputum per day. It is typically pink but lately it has been yellow. She denies fevers chills. She is frustrated that her weight will not go up from 130 pounds.  08/07/12 Sick visit -- Hannah Kline comes to clinic today because last week she developed hemoptysis while visiting [place]. She states that she took a train from [place] to [place] on Monday and and felt some degree of shortness of breath while walking around in town. On Tuesday evening after dinner she felt the need to clear her throat and then noticed that she had a mouthful of bright red blood. She had cough productive of bright red blood for several days thereafter and then this eventually turned into a peak colored sputum. At the time she had a chill but no chest pain. She denies leg pain or swelling. She states that today her sputum is clear. Otherwise she has not had an increase in sputum production. She thinks that the Nasonex has helped with her cough.  10/10/2012 ROV -- Hannah Kline has had a rough time. She did not have  improvement with the Cipro we gave her earlier in November. She has not had further episodes of coughing up blood but she has had increased sputum production. This is been associated with weight loss, fatigue, and chills. She has noticed mold in her home recently and a recent hypersensitivity pneumonitis panel showed some reaction to aspergillus species. She went to [place] for a week to visit her daughter around Thanksgiving and she said she felt much better while up there. She had less cough and shortness of breath.  10/17/2012 ROV -- Hannah Kline is feeling a little better in that she has more energy.  She is still coughing, but not making as much sputum.  She is still somewhat short of breath, but again improved.  No fever, no chills.  No hemoptysis, no chest pain.  She is still taking the cipro.  She has not gotten the tobi yet.  11/14/12 ROV -- After finisihing the cipro Hannah Kline said that she started to feel better.  She feels that she has a lot of sinus disease and is getting a lot of plugs from sinuses.  They are typically yellow and sometimes cough worse when lying down. She usually gets up nasty stuff in after noon from her lungs but this is no worse than baseline.  Her chest congestion is worse every other day.  She denies allergic symptoms like itchy eyes, scratchy throat.    Her house has been  treated twice now for mold, as recent as 10 days ago.    She says that her breathing is good, but she sometimes gets out of breath with exertion.  She has gone back to exercising regularly.  Past Medical History  Diagnosis Date  . Atrial fibrillation   . History of Mycobacterium avium complex infection     lung disease  . COPD (chronic obstructive pulmonary disease)    . Bronchiectasis   . Hepatitis     h/o INH  . Bacterial infection due to Pseudomonas   . Pleurisy 12/08/11     Review of Systems  Constitutional: Negative for fever, chills, fatigue and unexpected weight change.  HENT: Negative  for ear pain, nosebleeds and congestion.   Respiratory: Positive for cough and shortness of breath. Negative for choking.   Cardiovascular: Negative for chest pain and leg swelling.       Objective:   Physical Exam  Filed Vitals:   11/14/12 1158  BP: 130/70  Pulse: 63  Temp: 98.1 F (36.7 C)  TempSrc: Oral  Height: 5\' 8"  (1.727 m)  Weight: 124 lb (56.246 kg)  SpO2: 98%   Gen: well appearing, no acute distress HEENT: NCAT, PERRL, EOMi, OP clear, neck supple without masses PULM: Insp crackles worse in upper lobes than bases, rhonchi noted CV: RRR, no mgr, no JVD AB: BS+, soft, nontender, no hsm Ext: warm, no edema, no clubbing, no cyanosis  Review of 2012 sputum microbiology: Pan sensitive pseudomonas  09/2010 Full PFT: Ratio 63%, FEV1 1.49L (67% pred)  10/2011 CT Chest: impressive upper lobe cystic bronchiectasis, scattered lower lobe tree-in-bud abnormalities and scattered nodules; One RML solid nodule measures 7.13mm  January 2013 CT chest [facility]: Stable upper lobe cystic bronchiectasis also present in the right middle lobe. Scattered tree in bud abnormalities and nodules throughout all lung fields roughly unchanged from prior. Right middle lobe nodule is no longer visible but there is an 8mm nodule in the right upper lobe.  December 2013 simple spirometry FEV1 0.8 L  10/11/2012 Sputum: PSEUDOMONAS AERUGINOSA       Antibiotic  Sensitivity  Microscan  Status    CEFEPIME  Sensitive  2  Final    CEFTAZIDIME  Sensitive  <=1  Final    CIPROFLOXACIN  Intermediate  2  Final    GENTAMICIN  Sensitive  <=1  Final    IMIPENEM  Sensitive  1  Final    LEVOFLOXACIN  Intermediate  4  Final    PIP/TAZO  Sensitive  <=4  Final    TOBRAMYCIN  Sensitive  <=1  Final    January 2013 simple spirometry >> not reproducible by ATS standards     Assessment & Plan:   Chronic sinusitis This has been more of a problem for Hannah Kline lately, contributing to her cough significantly.  She may need to  see ENT and have a CT of her sinuses, but she would prefer conservative therapy.  Plan: -given all the antibiotics she has been on lately we will hold off on Cipro or other Abx -start saline rinses bid -continue nasonex -add chlorpheniramine and pseudophed/phenylephrine -see ENT if no improvement   BRONCHIECTASIS Hannah Kline is doing a little better today.  Her Spirometry today was not reproducible by ATS standards, so they are not helpful.  However she is feeling more like herself and exercising more, so I consider that progress.  She is having a lot of sinus trouble, see above.  At this we'll consider all the  trouble she had in December/November as a flare of bronchiectasis.  There was no serologic evidence of ABPA.  She did not have MAI in her sputum, but we should continue to look for this.  The only thing to add to her regimen for bronchiectasis is chronic suppressive therapy for her pseudomonas.  Plan: -continue Advair and mucociliary clearance measures -add Tobipodhaler 4 capsules bid every other month -check sputum for AFB x3 -f/u with [place] in two months    Updated Medication List Outpatient Encounter Prescriptions as of 11/14/2012  Medication Sig Dispense Refill  . dorzolamide-timolol (COSOPT) 22.3-6.8 MG/ML ophthalmic solution Place 1 drop into the left eye every 12 (twelve) hours.       . flecainide (TAMBOCOR) 50 MG tablet Take 1 tablet (50 mg total) by mouth 2 (two) times daily.  60 tablet  11  . Fluticasone-Salmeterol (ADVAIR DISKUS) 250-50 MCG/DOSE AEPB 1 puff twice a day rinse mouth after each use  180 each  4  . latanoprost (XALATAN) 0.005 % ophthalmic solution Place 1 drop into the left eye At bedtime.      . levalbuterol (XOPENEX HFA) 45 MCG/ACT inhaler Inhale 2 puffs into the lungs every 4 (four) hours as needed.  3 Inhaler  3  . levothyroxine (SYNTHROID, LEVOTHROID) 75 MCG tablet Take 1 tablet (75 mcg total) by mouth daily.  90 tablet  3  . mometasone (NASONEX) 50  MCG/ACT nasal spray Place 2 sprays into the nose daily as needed.  17 g  5  . Saline 0.9 % AERS As directed      . Tobramycin 300 MG/4ML NEBU Inhale 300 mg into the lungs 2 (two) times daily.  224 mL  1

## 2012-11-14 NOTE — Assessment & Plan Note (Addendum)
This has been more of a problem for Hannah Kline lately, contributing to her cough significantly.  She may need to see ENT and have a CT of her sinuses, but she would prefer conservative therapy.  Plan: -given all the antibiotics she has been on lately we will hold off on Cipro or other Abx -start saline rinses bid -continue nasonex -add chlorpheniramine and pseudophed/phenylephrine -see ENT if no improvement

## 2012-11-14 NOTE — Assessment & Plan Note (Signed)
Hannah Kline is doing a little better today.  Her Spirometry today was not reproducible by ATS standards, so they are not helpful.  However she is feeling more like herself and exercising more, so I consider that progress.  She is having a lot of sinus trouble, see above.  At this we'll consider all the trouble she had in December/November as a flare of bronchiectasis.  There was no serologic evidence of ABPA.  She did not have MAI in her sputum, but we should continue to look for this.  The only thing to add to her regimen for bronchiectasis is chronic suppressive therapy for her pseudomonas.  Plan: -continue Advair and mucociliary clearance measures -add Tobipodhaler 4 capsules bid every other month -check sputum for AFB x3 -f/u with [place] in two months

## 2012-11-14 NOTE — Telephone Encounter (Signed)
Spoke with patient, she states she was to inform Dr. Kendrick Fries of the mold growing in the heating/AC ducts in her home.  She states there were 4 different molds found in home and there were two that they found the most of: Alterineria and Claudosporium Sp.    Patient states her home has been sprayed twice now to clear the mold, they think they got it all this time, but will not know for sure for another 5-6 weeks.    Will forward to Dr. Kendrick Fries as Lorain Childes

## 2012-11-14 NOTE — Patient Instructions (Signed)
Use Neil Med rinses with distilled water at least twice per day using the instructions on the package. 1/2 hour after using the [facility] Med rinse, use Nasonex two puffs in each nostril once per day. Use chlortrimeton and an over the counter decongestant (pseudophed or phenylephrine) as needed for the sinus congestion and cough.  We will collect your sputum again to look for MAI in culture.  Once we finally get it approved, start taking the tobi podhaler twice a day every other month  Keep using your other medications as prescribed  We will see you back in 2 months or sooner if needed

## 2012-11-14 NOTE — Telephone Encounter (Signed)
noted 

## 2012-11-15 ENCOUNTER — Encounter: Payer: Self-pay | Admitting: Pulmonary Disease

## 2012-11-15 ENCOUNTER — Other Ambulatory Visit: Payer: Self-pay | Admitting: Pulmonary Disease

## 2012-11-15 MED ORDER — TOBRAMYCIN 28 MG IN CAPS: 4.0000 | ORAL_CAPSULE | Freq: Every day | RESPIRATORY_TRACT | Status: DC

## 2012-11-17 ENCOUNTER — Telehealth: Payer: Self-pay | Admitting: Pulmonary Disease

## 2012-11-17 NOTE — Telephone Encounter (Signed)
Member ID # [id number]. I called [employer] at [phone] the PA on TOBI Podhaler. This medication is not covered without a diagnosis of cystic fibrosis. However, I was told the patient may be able to get the TOBI Nebs if filed under her Medicare Part B Plan. The pharmacy is aware of the medication denial and the pt will also be notified. Pt asked that we check to see what the TOBI Podhaler would cost out of pocket. Per the pharmacy, the cost without insuracne will be $7855.99. Pt is aware fo the cost and, of course, she cannot afford to pay that.  Dr. Kendrick Fries, would you like to change this to nebs? If so, we will need a prescription and order to [facility] so they can work on this for the patient. TOBI nebs are normally obtained through a specialty pharmacy.  Pls advise.

## 2012-11-20 ENCOUNTER — Other Ambulatory Visit (INDEPENDENT_AMBULATORY_CARE_PROVIDER_SITE_OTHER): Payer: Medicare Other

## 2012-11-20 ENCOUNTER — Ambulatory Visit: Payer: Medicare Other | Admitting: Pulmonary Disease

## 2012-11-20 DIAGNOSIS — J479 Bronchiectasis, uncomplicated: Secondary | ICD-10-CM | POA: Diagnosis not present

## 2012-11-20 MED ORDER — TOBRAMYCIN 300 MG/4ML IN NEBU: 300.0000 mg | INHALATION_SOLUTION | Freq: Two times a day (BID) | RESPIRATORY_TRACT | Status: DC

## 2012-11-20 NOTE — Telephone Encounter (Signed)
Pt notified of new RX and will call if she has any questions.

## 2012-11-20 NOTE — Telephone Encounter (Signed)
Yes, please change to nebs

## 2012-11-20 NOTE — Telephone Encounter (Signed)
RX sent to the pharmacy as prescribed back in Dec 2013.  LMTCB x 1 for the pt so she can be informed.

## 2012-11-23 LAB — AFB CULTURE WITH SMEAR (NOT AT ARMC): Acid Fast Smear: NONE SEEN

## 2012-11-24 ENCOUNTER — Encounter: Payer: Self-pay | Admitting: Pulmonary Disease

## 2012-11-27 ENCOUNTER — Telehealth: Payer: Self-pay | Admitting: Pulmonary Disease

## 2012-11-27 NOTE — Telephone Encounter (Signed)
Please let her know that her sputum cultures for AFB were negative. No evidence of MAI (MAC). I spoke with patient about results and she verbalized understanding and had no questions

## 2012-12-07 ENCOUNTER — Telehealth: Payer: Self-pay | Admitting: Pulmonary Disease

## 2012-12-07 NOTE — Telephone Encounter (Signed)
Pt returned call. Hannah Kline  

## 2012-12-07 NOTE — Telephone Encounter (Signed)
Pt says she spoke with [facility] and was told they would have to order the TOBI but she has not heard anything else from them. I then called the pharmacy and was told that they did speak with the pt and told her that this medication has to come a specialty pharmacy the they could not get this. I then spoke with Almyra Free and she remebers talking with leslie about this pt and they were going to give the pt a financial form to fill out for the TOBI. The pt states she does remember getting the form and will need to check to see if she still has it. Pt will call triage back later today to let us know if she needs a new form mailed to her.

## 2012-12-07 NOTE — Telephone Encounter (Signed)
[id number] x1 

## 2012-12-08 NOTE — Telephone Encounter (Signed)
LMTCB x 1. Did pt find form for TOBI or do we need to mail her a new one? Almyra Free has the forms.

## 2012-12-08 NOTE — Telephone Encounter (Signed)
Called the pt back after talking with libby about these forms and the pt will drop these forms off in the [place] office on Monday since Dr. Kendrick Fries has office there that morning.  These forms will need to be given to libby once back at the office.  thanks

## 2012-12-26 ENCOUNTER — Ambulatory Visit (INDEPENDENT_AMBULATORY_CARE_PROVIDER_SITE_OTHER): Payer: Medicare Other | Admitting: Internal Medicine

## 2012-12-26 ENCOUNTER — Encounter: Payer: Self-pay | Admitting: Internal Medicine

## 2012-12-26 VITALS — BP 132/72 | HR 82 | Temp 97.6°F | Resp 16 | Wt 123.5 lb

## 2012-12-26 DIAGNOSIS — R5383 Other fatigue: Secondary | ICD-10-CM

## 2012-12-26 DIAGNOSIS — J479 Bronchiectasis, uncomplicated: Secondary | ICD-10-CM | POA: Diagnosis not present

## 2012-12-26 DIAGNOSIS — R630 Anorexia: Secondary | ICD-10-CM | POA: Diagnosis not present

## 2012-12-26 DIAGNOSIS — J449 Chronic obstructive pulmonary disease, unspecified: Secondary | ICD-10-CM

## 2012-12-26 DIAGNOSIS — R5381 Other malaise: Secondary | ICD-10-CM | POA: Diagnosis not present

## 2012-12-26 DIAGNOSIS — J309 Allergic rhinitis, unspecified: Secondary | ICD-10-CM

## 2012-12-26 MED ORDER — MIRTAZAPINE 15 MG PO TABS
15.0000 mg | ORAL_TABLET | Freq: Every day | ORAL | Status: DC
Start: 2012-12-26 — End: 2013-03-21

## 2012-12-26 NOTE — Patient Instructions (Addendum)
Start with 1/2 tablet of mirtazipine at bedtime or up to one hour before.  You can increase to full tablet after one week if you feel no effect.    Referral to cardiopulmonary rehab for physical therapy at [facility].     Referral to allergist to see if the mold in your home is causing you to have a reaction   Try benadryl for nasal drainage.   We can try atrovent if necessary    conbtinue sinus rinse.

## 2012-12-26 NOTE — Progress Notes (Signed)
Patient ID: Hannah Kline, female   DOB: 11/22/1931, 79 y.o.   MRN: [id number]    Patient Active Problem List  Diagnosis  . 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 ATRIAL FIBRILLATION  . BRONCHIECTASIS  . C O P D  . Fatigue  . Bacterial infection due to Pseudomonas  . Screening for colon cancer  . Screening for breast cancer  . Chronic sinusitis  . Pulmonary nodule  . Hypoxemia requiring supplemental oxygen  . Hemoptysis  . Orthostatic hypotension  . Weight loss  . Allergic rhinitis    Subjective:  CC:   Chief Complaint  Patient presents with  . Follow-up    Medications    HPI:   Hannah Kline is a 79 y.o. female who presents for follow up on chronic conditions including bronchiectasis with chronic hypoxia,  She has persistent  fatigue and malaise.  She is exhausted by doing minor housework.  Still going to exercise classes at [facility] but does not wear her 02 during the classes.  Wears her 02 at home when her sats are < 95% but does not wear it at night bc it will not stay on  Her quality of life is suffering due to her fatigue.  She has had her house examined for mold and several strains have been found and reported.  She continues to report post nasal drip aggravating her cough which was not affected by temporary suspension of flecainide for management of atrial fibrillation.   Past Medical History  Diagnosis Date  . Atrial fibrillation   . History of Mycobacterium avium complex infection     lung disease  . COPD (chronic obstructive pulmonary disease)    . Bronchiectasis   . Hepatitis     h/o INH  . Bacterial infection due to Pseudomonas   . Pleurisy 12/08/11    Past Surgical History  Procedure Laterality Date  . Shoulder arthroscopy      right       The following portions of the patient's history were reviewed and updated as appropriate: Allergies, current medications, and problem list.    Review of Systems:   Patient denies headache, fevers,   skin rash, eye pain, sinus congestion and sinus pain, sore throat, dysphagia,  Hemoptysis, wheezing, chest pain, palpitations, orthopnea, edema, abdominal pain, nausea, melena, diarrhea, constipation, flank pain, dysuria, hematuria, urinary frequency, nocturia, numbness, tingling, seizures,  Focal weakness, Loss of consciousness,  Tremor, insomnia, depression, anxiety, and suicidal ideation.     History   Social History  . Marital Status: Married    Spouse Name: N/A    Number of Children: N/A  . Years of Education: N/A   Occupational History  . Not on file.   Social History Main Topics  . Smoking status: Never Smoker   . Smokeless tobacco: Never Used  . Alcohol Use: 0 - .5 oz/week    0-1 drink(s) per week  . Drug Use: No  . Sexually Active: Not on file   Other Topics Concern  . Not on file   Social History Narrative  . No narrative on file    Objective:  BP 132/72  Pulse 82  Temp(Src) 97.6 F (36.4 C) (Oral)  Resp 16  Wt 123 lb 8 oz (56.019 kg)  BMI 18.78 kg/m2  General appearance: alert, cooperative and appears stated age Ears: normal TM's and external ear canals both ears Throat: lips, mucosa, and tongue normal; teeth and gums normal Neck: no adenopathy, no carotid bruit,  supple, symmetrical, trachea midline and thyroid not enlarged, symmetric, no tenderness/mass/nodules Back: symmetric, no curvature. ROM normal. No CVA tenderness. Lungs: clear to auscultation bilaterally Heart: regular rate and rhythm, S1, S2 normal, no murmur, click, rub or gallop Abdomen: soft, non-tender; bowel sounds normal; no masses,  no organomegaly Pulses: 2+ and symmetric Skin: Skin color, texture, turgor normal. No rashes or lesions Lymph nodes: Cervical, supraclavicular, and axillary nodes normal.  Assessment and Plan:  Fatigue Referral to pulmonary rehab and allergist. Trial of mirtzaipine .  Discussed overnight sleep study to rule out nocturnal desaturations if she has no  improvement with mirtazipine. .   Allergic rhinitis Persistent despite use of antihistamine and steroid nasal spray.  Referral to allergist for testing given recent n of mild strains from her house's ductwork    .A total of 40 minutes of face to face time was spent with patient more than half of which was spent in counselling and coordination of care    Updated Medication List Outpatient Encounter Prescriptions as of 12/26/2012  Medication Sig Dispense Refill  . dorzolamide-timolol (COSOPT) 22.3-6.8 MG/ML ophthalmic solution Place 1 drop into the left eye every 12 (twelve) hours.       . flecainide (TAMBOCOR) 50 MG tablet Take 1 tablet (50 mg total) by mouth 2 (two) times daily.  60 tablet  11  . Fluticasone-Salmeterol (ADVAIR DISKUS) 250-50 MCG/DOSE AEPB 1 puff twice a day rinse mouth after each use  180 each  4  . latanoprost (XALATAN) 0.005 % ophthalmic solution Place 1 drop into the left eye At bedtime.      . levalbuterol (XOPENEX HFA) 45 MCG/ACT inhaler Inhale 2 puffs into the lungs every 4 (four) hours as needed.  3 Inhaler  3  . levothyroxine (SYNTHROID, LEVOTHROID) 75 MCG tablet Take 1 tablet (75 mcg total) by mouth daily.  90 tablet  3  . mometasone (NASONEX) 50 MCG/ACT nasal spray Place 2 sprays into the nose daily as needed.  17 g  5  . Saline 0.9 % AERS As directed      . Tobramycin 300 MG/4ML NEBU Inhale 300 mg into the lungs 2 (two) times daily.  240 mL  1  . mirtazapine (REMERON) 15 MG tablet Take 1 tablet (15 mg total) by mouth at bedtime.  30 tablet  2   No facility-administered encounter medications on file as of 12/26/2012.

## 2012-12-27 ENCOUNTER — Encounter: Payer: Self-pay | Admitting: Internal Medicine

## 2012-12-27 NOTE — Assessment & Plan Note (Signed)
Persistent despite use of antihistamine and steroid nasal spray.  Referral to allergist for testing given recent n of mild strains from her house's ductwork

## 2012-12-27 NOTE — Assessment & Plan Note (Signed)
Referral to pulmonary rehab and allergist. Trial of mirtzaipine .  Discussed overnight sleep study to rule out nocturnal desaturations if she has no improvement with mirtazipine. Marland Kitchen

## 2013-01-02 LAB — AFB CULTURE WITH SMEAR (NOT AT ARMC): Acid Fast Smear: NONE SEEN

## 2013-01-11 DIAGNOSIS — H4010X0 Unspecified open-angle glaucoma, stage unspecified: Secondary | ICD-10-CM | POA: Diagnosis not present

## 2013-01-23 ENCOUNTER — Encounter: Payer: Self-pay | Admitting: Internal Medicine

## 2013-01-23 DIAGNOSIS — Z5189 Encounter for other specified aftercare: Secondary | ICD-10-CM | POA: Diagnosis not present

## 2013-01-23 DIAGNOSIS — J449 Chronic obstructive pulmonary disease, unspecified: Secondary | ICD-10-CM | POA: Diagnosis not present

## 2013-01-24 ENCOUNTER — Encounter: Payer: Self-pay | Admitting: Internal Medicine

## 2013-02-01 ENCOUNTER — Telehealth: Payer: Self-pay | Admitting: Pulmonary Disease

## 2013-02-01 NOTE — Telephone Encounter (Signed)
Received PA from [facility] for TOBI  I called the number given and it was incorrect Was transferred to [facility] at [phone] and had long wait time I will try back tomorrow Pt ID number is [id number]

## 2013-02-02 ENCOUNTER — Ambulatory Visit: Payer: Medicare Other

## 2013-02-02 ENCOUNTER — Encounter: Payer: Self-pay | Admitting: Internal Medicine

## 2013-02-02 ENCOUNTER — Ambulatory Visit (INDEPENDENT_AMBULATORY_CARE_PROVIDER_SITE_OTHER): Payer: Medicare Other | Admitting: Internal Medicine

## 2013-02-02 VITALS — BP 122/78 | HR 75 | Ht 68.0 in | Wt 125.0 lb

## 2013-02-02 DIAGNOSIS — J309 Allergic rhinitis, unspecified: Secondary | ICD-10-CM

## 2013-02-02 NOTE — Progress Notes (Signed)
02/02/13- 68 yoF never smoker referred courtesy of Dr Clydell Hakim mold in house.  She is followed by Dr. Kendrick Fries for bronchiectasis on home oxygen/pulmonary rehab. Mold was found in her home duct work with several species of, and molds identified by a company. Repeat a she is being done. She feels better now. For about a year she had complained of weakness, feeling tired, decreased appetite and weight loss. She lives at [facility], which owns the house. She had been noticing "bumps of white mold" on the furniture. The filters have been changed routinely and she has not seen black mold. She denies and allergy history and never had asthma. Husband and father smoked. She does notice postnasal drip and "sinus pressure and ". She has not tried antihistamines. She denies unusual reaction to food, insect venom.  Prior to Admission medications   Medication Sig Start Date End Date Taking? Authorizing Provider  dorzolamide-timolol (COSOPT) 22.3-6.8 MG/ML ophthalmic solution Place 1 drop into the left eye every 12 (twelve) hours.  12/06/11  Yes Historical Provider, MD  flecainide (TAMBOCOR) 50 MG tablet Take 1 tablet (50 mg total) by mouth 2 (two) times daily. 08/07/12 08/07/13 Yes Antonieta Iba, MD  Fluticasone-Salmeterol (ADVAIR DISKUS) 250-50 MCG/DOSE AEPB 1 puff twice a day rinse mouth after each use 12/21/11  Yes Barbaraann Share, MD  latanoprost (XALATAN) 0.005 % ophthalmic solution Place 1 drop into the left eye At bedtime. 10/11/11  Yes Historical Provider, MD  levalbuterol Pauline Aus HFA) 45 MCG/ACT inhaler Inhale 2 puffs into the lungs every 4 (four) hours as needed. 09/01/12  Yes Lupita Leash, MD  levothyroxine (SYNTHROID, LEVOTHROID) 75 MCG tablet Take 1 tablet (75 mcg total) by mouth daily. 09/26/12  Yes Sherlene Shams, MD  mirtazapine (REMERON) 15 MG tablet Take 1 tablet (15 mg total) by mouth at bedtime. 12/26/12  Yes Sherlene Shams, MD  mometasone (NASONEX) 50 MCG/ACT nasal  spray Place 2 sprays into the nose daily as needed. 08/30/12 08/30/13 Yes Lupita Leash, MD  Saline 0.9 % AERS As directed   Yes Historical Provider, MD  Tobramycin (TOBI PODHALER) 28 MG CAPS Place 4 capsules into inhaler and inhale 2 (two) times daily. 02/08/13   Lupita Leash, MD  Tobramycin 300 MG/4ML NEBU Inhale 300 mg into the lungs 2 (two) times daily. 11/20/12   Lupita Leash, MD   Past Medical History  Diagnosis Date  . Atrial fibrillation   . History of Mycobacterium avium complex infection     lung disease  . COPD (chronic obstructive pulmonary disease)    . Bronchiectasis   . Hepatitis     h/o INH  . Bacterial infection due to Pseudomonas   . Pleurisy 12/08/11   Past Surgical History  Procedure Laterality Date  . Shoulder arthroscopy      right   Family History  Problem Relation Age of Onset  . Angina Mother   . Other Mother     intestional blockage  . Diabetes Maternal Grandfather    History   Social History  . Marital Status: Married    Spouse Name: N/A    Number of Children: 5  . Years of Education: N/A   Occupational History  . Retired-Interior Estate agent    Social History Main Topics  . Smoking status: Never Smoker   . Smokeless tobacco: Never Used  . Alcohol Use: 0 - .5 oz/week    0-1 drink(s) per week  . Drug Use: No  .  Sexually Active: Not on file   Other Topics Concern  . Not on file   Social History Narrative  . No narrative on file   ROS-see HPI Constitutional:   + weight loss, no-night sweats, fevers, chills,+ fatigue, lassitude. HEENT:   No-  headaches, difficulty swallowing, tooth/dental problems, sore throat,       No-  sneezing, itching, ear ache, +nasal congestion, post nasal drip,  CV:  No-   chest pain, orthopnea, PND, swelling in lower extremities, anasarca,                                  dizziness, palpitations Resp: No-   shortness of breath with exertion or at rest.              No-   productive cough,  +  non-productive cough,  No- coughing up of blood.              No-   change in color of mucus.  No- wheezing.   Skin: No-   rash or lesions. GI:  No-   heartburn, indigestion, abdominal pain, nausea, vomiting, diarrhea,                 change in bowel habits, +loss of appetite GU: No-   dysuria, change in color of urine, no urgency or frequency.  No- flank pain. MS:  No-   joint pain or swelling.  No- decreased range of motion.  No- back pain. Neuro-     nothing unusual Psych:  No- change in mood or affect. No depression or anxiety.  No memory loss.  OBJ- Physical Exam General- Alert, Oriented, Affect-appropriate, Distress- none acute. Skin Skin- rash-none, lesions- none, excoriation- none Lymphadenopathy- none Head- atraumatic            Eyes- Gross vision intact, PERRLA, conjunctivae and secretions clear            Ears- Hearing, canals-normal            Nose- Clear, +pale mucosa, no-Septal dev, mucus, polyps, erosion, perforation             Throat- Mallampati II , mucosa clear , drainage- none, tonsils- atrophic Neck- flexible , trachea midline, no stridor , thyroid nl, carotid no bruit Chest - symmetrical excursion , unlabored           Heart/CV- RRR , no murmur , no gallop  , no rub, nl s1 s2                           - JVD- none , edema- none, stasis changes- none, varices- none           Lung- +wet squeaks lower lung zones, wheeze- none, cough- none , dullness-none, rub- none           Chest wall-  Abd- tender-no, distended-no, bowel sounds-present, HSM- no Br/ Gen/ Rectal- Not done, not indicated Extrem- cyanosis- none, clubbing, none, atrophy- none, strength- nl Neuro- grossly intact to observation

## 2013-02-02 NOTE — Telephone Encounter (Signed)
I have filled out PA form and faxed to [facility] Will check in CoverMyMeds next wk to check the status

## 2013-02-02 NOTE — Patient Instructions (Addendum)
Order- lab  Allergy profile   Dx Allergic rhinitis  Try taking otc antihistamine Claritin/ loratadine once daily in the morning x 2 weeks. See what difference it makes.

## 2013-02-05 ENCOUNTER — Encounter: Payer: Self-pay | Admitting: Pulmonary Disease

## 2013-02-05 LAB — ALLERGY FULL PROFILE
Allergen,Goose feathers, e70: <0.1 kU/L
Bermuda Grass: <0.1 kU/L
Box Elder IgE: <0.1 kU/L
Curvularia lunata: <0.1 kU/L
Dog Dander: <0.1 kU/L
Fescue: <0.1 kU/L
G005 Rye, Perennial: <0.1 kU/L
Goldenrod: <0.1 kU/L
Helminthosporium halodes: <0.1 kU/L
IgE (Immunoglobulin E), Serum: 2 IU/mL (ref 0.0–180.0)
Lamb's Quarters: <0.1 kU/L
Stemphylium Botryosum: <0.1 kU/L

## 2013-02-05 LAB — OTHER SOLSTAS TEST
Alternaria Alternata: <0.1 kU/L
Aspergillus fumigatus, m3: <0.1 kU/L
Cladosporium Herbarum: <0.1 kU/L

## 2013-02-06 ENCOUNTER — Encounter: Payer: Self-pay | Admitting: Internal Medicine

## 2013-02-06 DIAGNOSIS — Z5189 Encounter for other specified aftercare: Secondary | ICD-10-CM | POA: Diagnosis not present

## 2013-02-06 DIAGNOSIS — J449 Chronic obstructive pulmonary disease, unspecified: Secondary | ICD-10-CM | POA: Diagnosis not present

## 2013-02-06 NOTE — Telephone Encounter (Signed)
I really don't know the answer to this question.  Is there someone in the office who might know the answer to that?  Sorry, I'm a Sport and exercise psychologist.

## 2013-02-06 NOTE — Telephone Encounter (Signed)
Melissa from [place] called & would like to know if the PA is going through Medicare Part B-it looks like the requests we sent in was for part D.  Melissa can be reached at  [phone].  Antionette Fairy

## 2013-02-06 NOTE — Telephone Encounter (Signed)
Received denial from [facility] for Tobi  Will forward to Dr Kendrick Fries to make him aware and see about maybe doing an appeal

## 2013-02-07 NOTE — Telephone Encounter (Signed)
Spoke with Melissa and she states that the med is covered under Part B, but this is still under review by her pharmacist and she should have a definite answer in the next 48 hrs. She will call me back and let me know the answer and will then send rx.

## 2013-02-08 ENCOUNTER — Telehealth: Payer: Self-pay | Admitting: Pulmonary Disease

## 2013-02-08 MED ORDER — TOBRAMYCIN 28 MG IN CAPS: 4.0000 | ORAL_CAPSULE | Freq: Two times a day (BID) | RESPIRATORY_TRACT | Status: DC

## 2013-02-08 NOTE — Telephone Encounter (Signed)
Pt states that the podhaler will cost her $2700 a month and she wanted to know if the neb solution would be cheaper. I called the pt insurance and was advised that the neb will not be covered under her plan. Pt is aware and ok to continue with podhaler. Carron Curie, CMA

## 2013-02-08 NOTE — Telephone Encounter (Signed)
Received a letter from [employer] stating Tobipod has been approved until Jan 2015  Pt aware and [facility]

## 2013-02-09 ENCOUNTER — Telehealth: Payer: Self-pay | Admitting: Pulmonary Disease

## 2013-02-09 NOTE — Telephone Encounter (Signed)
Called and spoke with pt and she wanted to see if [facility] could get this medication any cheaper for her.  The nebulizer medication is not aval. To her at all but the podhaler will cost her 2800.00.  Pt stated that she is willing to do this for 1 month to see how well she does on this medication but wanted to make Dr. Kendrick Fries aware and to see if he can suggest another medication that would be more cost effective for the pt.  BQ please advise. Thanks  Allergies  Allergen Reactions  . Isoniazid     unknown  . Moxifloxacin     Avelox  REACTION: chills, fainting

## 2013-02-15 NOTE — Telephone Encounter (Signed)
I thought that her insurance was covering inhaled tobi? $2800 seems like a lot with insurance coverage.  Tobi comes in two forms: Tobi podhaler (newer, likely more expensive, but easier to use) vs Tobi nebs (cheaper, uses a special nebulizer machine)  You can't get it from a compounding pharmacy and most major pharmacies don't carry it unless they make a special arrangement for a patient.  Certain specialty pharmacies carry it;  here is an example: https://www.cfservicespharmacy.com/ProductsandPrices/FDAApprovesNewPowderFormofCFDrugTOBI/  If we can't get it through the [facility] pharmacy, then we should call the manufacture:  Http://www.tobipodhaler.com/index.jsp

## 2013-02-15 NOTE — Telephone Encounter (Signed)
Called spoke with patient, let her know that we are still working on this for her and that we will call once we get this straightened out  [facility] [employer] spoke with pharmacist Jacki Cones who reported that the podhaler is discontinued, and the neb solution can only be filled at a compounding pharmacy and filed under Medicare Part B  Called [facility] pharmacy in [place] to see if they are a compounding pharmacy > per [place] pharmacist they are not and was kind enough to call the 2 compounding pharmacies in the [place] area > neither have a sterile area.  Jodi recommended [facility] in [place] or [facility] in [place].  BQ on schedule for night float for 4/9 and 4/10.  Will route to BQ and call later this afternoon

## 2013-02-19 MED ORDER — TOBRAMYCIN 28 MG IN CAPS: 4.0000 | ORAL_CAPSULE | Freq: Two times a day (BID) | RESPIRATORY_TRACT | Status: DC

## 2013-02-19 NOTE — Telephone Encounter (Signed)
I called and spoke with rep and the [facility] specialty pharmacy Was advised to go ahead and fax rx to them at [phone] I will give to Lillia Abed to take to [facility] tomorrow to have BQ sign Will forward this msg to him as a reminder

## 2013-02-22 NOTE — Telephone Encounter (Signed)
Per Hannah Kline, she faxed the rx to the [facility] and this message can be closed Will do so

## 2013-03-05 ENCOUNTER — Ambulatory Visit (INDEPENDENT_AMBULATORY_CARE_PROVIDER_SITE_OTHER): Payer: Medicare Other

## 2013-03-05 ENCOUNTER — Telehealth: Payer: Self-pay | Admitting: *Deleted

## 2013-03-05 VITALS — BP 90/60 | HR 103 | Ht 68.0 in | Wt 125.5 lb

## 2013-03-05 DIAGNOSIS — I4891 Unspecified atrial fibrillation: Secondary | ICD-10-CM

## 2013-03-05 MED ORDER — DIGOXIN 250 MCG PO TABS: ORAL_TABLET | ORAL | Status: DC

## 2013-03-05 MED ORDER — DABIGATRAN ETEXILATE MESYLATE 150 MG PO CAPS: 150.0000 mg | ORAL_CAPSULE | Freq: Two times a day (BID) | ORAL | Status: DC

## 2013-03-05 NOTE — Telephone Encounter (Signed)
Pt calling stating that she has had hr over 100 BPM. Pt says that she took her flecainide a lot this weekend and it didn't help.

## 2013-03-05 NOTE — Telephone Encounter (Signed)
Pt c/o atrial fib episode lasting 2 days with HR=110-113 BPM Denies dehydration, says she has been eating and drinking, says she has been drinking "plenty of water" Feels SOB and weak Confirms compliance with medications as prescribed Took flecainide q2hours yesterday with no improvement in symptoms Advised, per Dr. Windell Hummingbird last note, to come in for EKG to assess rhythm. Understanding verb

## 2013-03-05 NOTE — Progress Notes (Signed)
Pt here with c/o atrial fib, elevated HR x 2 days HR=110-113 at home x 2 days Has taken flecainide q hours yesterday in addition to her scheduled doses with no improvement in HR C/o sob, dizziness EKG performed Will review with Dr. Mariah Milling  Reviewed with Dr. Mariah Milling, he asks that we call pt back with instructions, needs to look at chart after clinic and will call pt with instructions. Pt informed  "Have pt take digoxin 0.50 mg today then take digoxin 0.25 mg daily. Start Pradaxa 150 mg PO BID.  If still in atrial fib may need to cardiovert her." VO Dr. Alvis Lemmings, RN  Pt informed of plan Understanding verb Samples of Pradaxa left at [facility]

## 2013-03-05 NOTE — Patient Instructions (Addendum)
Your physician has recommended you make the following change in your medication:  -start Pradaxa 150 mg twice daily -start digoxin 0.50 mg today then take 0.25 mg daily  Your physician wants you to follow-up in: 2 weeks with Dr. Mariah Milling. You will receive a reminder letter in the mail two months in advance. If you don't receive a letter, please call our office to schedule the follow-up appointment.

## 2013-03-07 ENCOUNTER — Telehealth: Payer: Self-pay | Admitting: Internal Medicine

## 2013-03-07 ENCOUNTER — Telehealth: Payer: Self-pay | Admitting: Pulmonary Disease

## 2013-03-07 NOTE — Telephone Encounter (Signed)
Spoke with [facility]  Rep advised that [facility] has med ready to be shipped to the pt They were just waiting on a response from the pt which they already received today since this msg was left  Pt should be getting med very soon! Nothing further needed

## 2013-03-07 NOTE — Telephone Encounter (Signed)
Plan:   -add Tobipodhaler 4 capsules bid every other month -----  ATC. Was transferred twice and then  was on hold x 15 minutes. WCB

## 2013-03-08 ENCOUNTER — Encounter: Payer: Self-pay | Admitting: Internal Medicine

## 2013-03-08 DIAGNOSIS — Z5189 Encounter for other specified aftercare: Secondary | ICD-10-CM | POA: Diagnosis not present

## 2013-03-08 DIAGNOSIS — J449 Chronic obstructive pulmonary disease, unspecified: Secondary | ICD-10-CM | POA: Diagnosis not present

## 2013-03-08 DIAGNOSIS — J479 Bronchiectasis, uncomplicated: Secondary | ICD-10-CM | POA: Diagnosis not present

## 2013-03-08 NOTE — Telephone Encounter (Signed)
Spoke with Penni Bombard at [facility] and verified order for [employer].

## 2013-03-12 ENCOUNTER — Encounter: Payer: Self-pay | Admitting: Cardiovascular Disease

## 2013-03-12 ENCOUNTER — Ambulatory Visit (INDEPENDENT_AMBULATORY_CARE_PROVIDER_SITE_OTHER): Payer: Medicare Other | Admitting: Cardiovascular Disease

## 2013-03-12 VITALS — BP 128/70 | HR 66 | Ht 68.0 in | Wt 125.5 lb

## 2013-03-12 DIAGNOSIS — I4891 Unspecified atrial fibrillation: Secondary | ICD-10-CM | POA: Diagnosis not present

## 2013-03-12 DIAGNOSIS — R5383 Other fatigue: Secondary | ICD-10-CM | POA: Diagnosis not present

## 2013-03-12 DIAGNOSIS — I951 Orthostatic hypotension: Secondary | ICD-10-CM

## 2013-03-12 DIAGNOSIS — J479 Bronchiectasis, uncomplicated: Secondary | ICD-10-CM | POA: Diagnosis not present

## 2013-03-12 NOTE — Assessment & Plan Note (Signed)
Denies any flareup of her lung symptoms. Some coughing which is chronic

## 2013-03-12 NOTE — Assessment & Plan Note (Signed)
She converted out of atrial fibrillation back to normal sinus rhythm  April 29. Less symptomatic at that time with regular rhythm. We'll continue her on the same medications. She would like to stop anticoagulation and digoxin. She'll continue on flecainide 50 mg twice a day. 4 recurrent atrial fibrillation, she could take extra flecainide, digoxin. If she starts having one episode, could add diltiazem and metoprolol when necessary, possibly increasing her daily flecainide to 75 mg twice a day.

## 2013-03-12 NOTE — Assessment & Plan Note (Signed)
Denies any significant dizziness. Weight has been stable

## 2013-03-12 NOTE — Assessment & Plan Note (Addendum)
Recent fatigue that seemed to calm prior to her atrial fibrillation. Still with mild symptoms, better today. Participated with pulmonary rehabilitation with good energy.

## 2013-03-12 NOTE — Patient Instructions (Addendum)
You are doing well. No medication changes were made.  If you develop atrial fibrillation,  Take digoxin x 2, extra flecainide and take pradaxa twice a day  Please call us if you have new issues that need to be addressed before your next appt.  Your physician wants you to follow-up in: 6 months.  You will receive a reminder letter in the mail two months in advance. If you don't receive a letter, please call our office to schedule the follow-up appointment.

## 2013-03-12 NOTE — Progress Notes (Signed)
Patient ID: Hannah Kline, female    DOB: 1932-10-18, 79 y.o.   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  In the past, she has failed sotalol. Amiodarone was discontinued given underlying lung disease. She was started on  Flecainide 50 mg twice a day with good success.  Episode of atrial fibrillation on April 27th to the 29th. She came to the office, had an EKG. She took digoxin x2 then daily, extra flecainide. This was the longest episode of atrial fibrillation she has had. She is symptomatic in atrial fibrillation. She was also given pradaxa and took his twice a day.   Weight has been stable. Remeron has been added to her medications  Other  Episodes of tachycardia in July 2013, October 2013.   EKG shows Normal sinus rhythm with rate 66 beats per minute with incomplete right bundle branch block, left anterior fascicular block    Outpatient Encounter Prescriptions as of 03/12/2013  Medication Sig Dispense Refill  . dabigatran (PRADAXA) 150 MG CAPS Take 1 capsule (150 mg total) by mouth every 12 (twelve) hours.  60 capsule  3  . digoxin (LANOXIN) 0.25 MG tablet Take 2 tablets today then take 1 tablet daily  90 tablet  3  . dorzolamide-timolol (COSOPT) 22.3-6.8 MG/ML ophthalmic solution Place 1 drop into the left eye every 12 (twelve) hours.       . flecainide (TAMBOCOR) 50 MG tablet Take 1 tablet (50 mg total) by mouth 2 (two) times daily.  60 tablet  11  . Fluticasone-Salmeterol (ADVAIR DISKUS) 250-50 MCG/DOSE AEPB 1 puff twice a day rinse mouth after each use  180 each  4  . latanoprost (XALATAN) 0.005 % ophthalmic solution Place 1 drop into the left eye At bedtime.      . levalbuterol (XOPENEX HFA) 45 MCG/ACT inhaler Inhale 2 puffs into the lungs every 4 (four)  hours as needed.  3 Inhaler  3  . levothyroxine (SYNTHROID, LEVOTHROID) 75 MCG tablet Take 1 tablet (75 mcg total) by mouth daily.  90 tablet  3  . mirtazapine (REMERON) 15 MG tablet Take 1 tablet (15 mg total) by mouth at bedtime.  30 tablet  2  . mometasone (NASONEX) 50 MCG/ACT nasal spray Place 2 sprays into the nose daily as needed.  17 g  5  . Saline 0.9 % AERS As directed      . Tobramycin (TOBI PODHALER) 28 MG CAPS Place 4 capsules into inhaler and inhale 2 (two) times daily.  240 capsule  5  . [DISCONTINUED] Tobramycin 300 MG/4ML NEBU Inhale 300 mg into the lungs 2 (two) times daily.  240 mL  1   No facility-administered encounter medications on file as of 03/12/2013.    Review of Systems  Constitutional: Positive for fatigue.  HENT: Negative.   Eyes: Negative.   Cardiovascular: Negative.   Gastrointestinal: Negative.   Musculoskeletal: Negative.   Skin: Negative.   Psychiatric/Behavioral: Negative.   All other systems reviewed and are negative.     BP 128/70  Pulse 66  Ht 5\' 8"  (1.727 m)  Wt 125 lb 8 oz (56.926 kg)  BMI 19.09 kg/m2  Physical Exam  Nursing note and vitals reviewed. Constitutional: She is oriented to person, place, and time. She appears well-developed and well-nourished.  HENT:  Head:  Normocephalic.  Nose: Nose normal.  Mouth/Throat: Oropharynx is clear and moist.  Eyes: Conjunctivae are normal. Pupils are equal, round, and reactive to light.  Neck: Normal range of motion. Neck supple. No JVD present.  Cardiovascular: Normal rate, regular rhythm, S1 normal, S2 normal, normal heart sounds and intact distal pulses.  Exam reveals no gallop and no friction rub.   No murmur heard. Pulmonary/Chest: Effort normal. No respiratory distress. She has no wheezes. She has rales. She exhibits no tenderness.  Abdominal: Soft. Bowel sounds are normal. She exhibits no distension. There is no tenderness.  Musculoskeletal: Normal range of motion. She exhibits no edema  and no tenderness.  Lymphadenopathy:    She has no cervical adenopathy.  Neurological: She is alert and oriented to person, place, and time. Coordination normal.  Skin: Skin is warm and dry. No rash noted. No erythema.  Psychiatric: She has a normal mood and affect. Her behavior is normal. Judgment and thought content normal.    Assessment and Plan

## 2013-03-15 DIAGNOSIS — Z85828 Personal history of other malignant neoplasm of skin: Secondary | ICD-10-CM | POA: Diagnosis not present

## 2013-03-15 DIAGNOSIS — L821 Other seborrheic keratosis: Secondary | ICD-10-CM | POA: Diagnosis not present

## 2013-03-15 DIAGNOSIS — D1800 Hemangioma unspecified site: Secondary | ICD-10-CM | POA: Diagnosis not present

## 2013-03-15 DIAGNOSIS — Z85820 Personal history of malignant melanoma of skin: Secondary | ICD-10-CM | POA: Diagnosis not present

## 2013-03-15 DIAGNOSIS — L570 Actinic keratosis: Secondary | ICD-10-CM | POA: Diagnosis not present

## 2013-03-20 ENCOUNTER — Ambulatory Visit (INDEPENDENT_AMBULATORY_CARE_PROVIDER_SITE_OTHER): Payer: Medicare Other | Admitting: Internal Medicine

## 2013-03-20 ENCOUNTER — Encounter: Payer: Self-pay | Admitting: Internal Medicine

## 2013-03-20 VITALS — BP 118/62 | HR 68 | Ht 68.0 in | Wt 128.6 lb

## 2013-03-20 DIAGNOSIS — J310 Chronic rhinitis: Secondary | ICD-10-CM | POA: Diagnosis not present

## 2013-03-20 DIAGNOSIS — J449 Chronic obstructive pulmonary disease, unspecified: Secondary | ICD-10-CM

## 2013-03-20 NOTE — Patient Instructions (Addendum)
Please call if needed. Otherwise follow-up with Dr Kendrick Fries. Good luck with the Tobramycin.

## 2013-03-20 NOTE — Progress Notes (Signed)
02/02/13- 81 yoF never smoker referred courtesy of Dr Clydell Hakim mold in house.  She is followed by Dr. Kendrick Fries for bronchiectasis on home oxygen/pulmonary rehab. Mold was found in her home duct work with several species of  molds identified by a company. Remediation is being done. She feels better now. For about a year she had complained of weakness, feeling tired, decreased appetite and weight loss. She lives at [facility], which owns the house. She had been noticing "bumps of white mold" on the furniture. The filters have been changed routinely and she has not seen black mold. She denies any allergy history and never had asthma. Husband and father smoked. She does notice postnasal drip and "sinus pressure  ". She has not tried antihistamines. She denies unusual reaction to food, insect venom.  03/20/13- 38 yoF never smoker referred courtesy of Dr Clydell Hakim mold in house.Bronchiectasis (Dr Kendrick Fries) FOLLOWS FOR: denies any flare ups at this time-[facility] faxing allergy full profile and mold test to [place] to review with patient.  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 She had her HVAC unit remediated. Allergy Profile 02/02/2013- NEG- total IgE 2.0 with no specific elevations of any allergens including mold allergen panel.  ROS-see HPI Constitutional:   + weight loss, no-night sweats, fevers, chills,+ fatigue, lassitude. HEENT:   No-  headaches, difficulty swallowing, tooth/dental problems, sore throat,       No-  sneezing, itching, ear ache, +nasal congestion, post nasal drip,  CV:  No-   chest pain, orthopnea, PND, swelling in lower extremities, anasarca,                                  dizziness, palpitations Resp: No-   shortness of breath with exertion or at rest.              +  productive cough,  + non-productive cough,  No- coughing up of blood.              No-   change in color of mucus.  No- wheezing.   Skin: No-   rash or  lesions. GI:  No-   heartburn, indigestion, abdominal pain, nausea, vomiting,  GU:. MS:  No-   joint pain or swelling.   Neuro-     nothing unusual Psych:  No- change in mood or affect. No depression or anxiety.  No memory loss.  OBJ- Physical Exam General- Alert, Oriented, Affect-appropriate, Distress- none acute. Skin Skin- rash-none, lesions- none, excoriation- none Lymphadenopathy- none Head- atraumatic            Eyes- Gross vision intact, PERRLA, conjunctivae and secretions clear            Ears- Hearing, canals-normal            Nose- Clear, +pale mucosa, no-Septal dev, mucus, polyps, erosion, perforation             Throat- Mallampati II , mucosa clear , drainage- none, tonsils- atrophic Neck- flexible , trachea midline, no stridor , thyroid nl, carotid no bruit Chest - symmetrical excursion , unlabored           Heart/CV- RRR , no murmur , no gallop  , no rub, nl s1 s2                           - JVD- none ,  edema- none, stasis changes- none, varices- none           Lung- +coarse breath sounds, wheeze- none, cough- none , dullness-none, rub- none           Chest wall-  Abd-  Br/ Gen/ Rectal- Not done, not indicated Extrem- cyanosis- none, clubbing, none, atrophy- none, strength- nl Neuro- grossly intact to observation

## 2013-03-21 ENCOUNTER — Other Ambulatory Visit: Payer: Self-pay | Admitting: *Deleted

## 2013-03-21 DIAGNOSIS — R5383 Other fatigue: Secondary | ICD-10-CM

## 2013-03-21 DIAGNOSIS — R5381 Other malaise: Secondary | ICD-10-CM

## 2013-03-21 DIAGNOSIS — R630 Anorexia: Secondary | ICD-10-CM

## 2013-03-22 ENCOUNTER — Telehealth: Payer: Self-pay | Admitting: Pulmonary Disease

## 2013-03-22 ENCOUNTER — Other Ambulatory Visit: Payer: Self-pay | Admitting: Internal Medicine

## 2013-03-22 MED ORDER — MIRTAZAPINE 15 MG PO TABS: 15.0000 mg | ORAL_TABLET | Freq: Every day | ORAL | Status: DC

## 2013-03-22 NOTE — Telephone Encounter (Signed)
Please advise as to refill last office visit 3/28

## 2013-03-22 NOTE — Telephone Encounter (Signed)
Not a lot of other measures to offer. Suggest she continue present therapy. She has a appointment with Dr Kendrick Fries in 2 weeks. If she gets worse, then call again- would need CXR and labs at that time.

## 2013-03-22 NOTE — Telephone Encounter (Signed)
Spoke with the pt and notified of recs per CDY She verbalized understanding and states nothing further needed 

## 2013-03-22 NOTE — Telephone Encounter (Signed)
PT c/o increased sob for past 2 days.  Sat on RA 88% so pt is staying on oxygen most of the time now to keep sats at 95%.  Has non prod cough.  Denies fever.  Also using chest vest daily.  On day 10 of Tobihaler.  Please advise on what else pt can do.  Will forward to doc of day - Dr Maple Hudson please advise.

## 2013-03-26 ENCOUNTER — Telehealth: Payer: Self-pay | Admitting: Pulmonary Disease

## 2013-03-26 NOTE — Telephone Encounter (Signed)
I spoke with pt and she stated after taking the tobi on Thursday she felt really sick. She stated she had no energy, sob was slightly increased, tongue/mouth hurts, very thirsty and had no appetite. She stated she has been using her O2 constant since. She went to pulm rehab and had to leave bc she just couldn't;t do it. She stated she is going to pulm rehab today to see how she does. Per pt she knows its the Tobi inhaler and she states she just can;t take this medication any longer. She is wanting to stop it. Pt is aware BQ is in [place] tomorrow and is fine with a call back then. Please advise thanks

## 2013-03-27 NOTE — Telephone Encounter (Signed)
I spoke with pt and is aware. Nothing further was needed 

## 2013-03-27 NOTE — Telephone Encounter (Signed)
Tell her to let us know if she does not feel better See my previous note as well

## 2013-03-27 NOTE — Telephone Encounter (Signed)
OK to hold tobi I've never heard of someone having this sort of a reaction, but I trust Ebelyn's judgement and agree that she should come off the tobi until I see her next

## 2013-03-29 ENCOUNTER — Ambulatory Visit: Payer: Medicare Other | Admitting: Cardiovascular Disease

## 2013-03-31 NOTE — Assessment & Plan Note (Signed)
Negative allergy profile results makes atopic rhinitis extremely unlikely. If clinical suspicion remains, we can do allergy skin testing

## 2013-03-31 NOTE — Assessment & Plan Note (Signed)
Negative allergy profile with low total IgE makes an atopic contribution to her lung disease unlikely. If clinical concern persists, we can do allergy skin testing.

## 2013-04-03 ENCOUNTER — Telehealth: Payer: Self-pay | Admitting: Pulmonary Disease

## 2013-04-03 MED ORDER — FLUTICASONE-SALMETEROL 250-50 MCG/DOSE IN AEPB: INHALATION_SPRAY | RESPIRATORY_TRACT | Status: DC

## 2013-04-03 NOTE — Telephone Encounter (Signed)
Rx has been sent to [facility]. Pt is aware. 

## 2013-04-08 ENCOUNTER — Encounter: Payer: Self-pay | Admitting: Internal Medicine

## 2013-04-08 DIAGNOSIS — Z5189 Encounter for other specified aftercare: Secondary | ICD-10-CM | POA: Diagnosis not present

## 2013-04-08 DIAGNOSIS — J479 Bronchiectasis, uncomplicated: Secondary | ICD-10-CM | POA: Diagnosis not present

## 2013-04-08 DIAGNOSIS — J449 Chronic obstructive pulmonary disease, unspecified: Secondary | ICD-10-CM | POA: Diagnosis not present

## 2013-04-10 ENCOUNTER — Telehealth: Payer: Self-pay | Admitting: Pulmonary Disease

## 2013-04-10 ENCOUNTER — Encounter: Payer: Self-pay | Admitting: Pulmonary Disease

## 2013-04-10 ENCOUNTER — Ambulatory Visit (INDEPENDENT_AMBULATORY_CARE_PROVIDER_SITE_OTHER): Payer: Medicare Other | Admitting: Pulmonary Disease

## 2013-04-10 VITALS — BP 120/68 | HR 66 | Temp 98.2°F | Ht 68.0 in | Wt 124.0 lb

## 2013-04-10 DIAGNOSIS — A310 Pulmonary mycobacterial infection: Secondary | ICD-10-CM

## 2013-04-10 DIAGNOSIS — J310 Chronic rhinitis: Secondary | ICD-10-CM | POA: Diagnosis not present

## 2013-04-10 DIAGNOSIS — J479 Bronchiectasis, uncomplicated: Secondary | ICD-10-CM | POA: Diagnosis not present

## 2013-04-10 DIAGNOSIS — J449 Chronic obstructive pulmonary disease, unspecified: Secondary | ICD-10-CM

## 2013-04-10 DIAGNOSIS — J4489 Other specified chronic obstructive pulmonary disease: Secondary | ICD-10-CM

## 2013-04-10 NOTE — Assessment & Plan Note (Signed)
Advised using Nasonex regularly

## 2013-04-10 NOTE — Telephone Encounter (Signed)
LMTCB

## 2013-04-10 NOTE — Assessment & Plan Note (Signed)
She does not have COPD, she is bronchiectasis.

## 2013-04-10 NOTE — Assessment & Plan Note (Signed)
No indication for treatment based on normal cultures 11/2012

## 2013-04-10 NOTE — Progress Notes (Signed)
Subjective:    Patient ID: Hannah Kline, female    DOB: May 11, 1932, 79 y.o.   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 She was diagnosed at age 79 in [place]. She was treated with rifampin ethambutol and clarithromycin for 2 years. Since then she does not believe she was ever grown out MAI.   HPI  7/8 routine office visit--Hannah Kline says that she's doing quite well and is now exercising 5 days a week. She is using pulse oximetry and states that usually during her exercise routine her oxygen stays above 92% on room air. However sometimes with her weight routine it drops below 88%. She takes albuterol before going to class and this helps quite a bit. She does not think that the Cipro has helped and she would like to stop. She continues to cough daily often getting up one half cup of sputum per day. It is typically pink but lately it has been yellow. She denies fevers chills. She is frustrated that her weight will not go up from 130 pounds.  08/07/12 Sick visit -- Hannah Kline comes to clinic today because last week she developed hemoptysis while visiting [place]. She states that she took a train from [place] to [place] on Monday and and felt some degree of shortness of breath while walking around in town. On Tuesday evening after dinner she felt the need to clear her throat and then noticed that she had a mouthful of bright red blood. She had cough productive of bright red blood for several days thereafter and then this eventually turned into a peak colored sputum. At the time she had a chill but no chest pain. She denies leg pain or swelling. She states that today her sputum is clear. Otherwise she has not had an increase in sputum production. She thinks that the Nasonex has helped with her cough.  10/10/2012 ROV -- Hannah Kline has had a rough time. She did not have  improvement with the Cipro we gave her earlier in November. She has not had further episodes of coughing up blood but she has had increased sputum production. This is been associated with weight loss, fatigue, and chills. She has noticed mold in her home recently and a recent hypersensitivity pneumonitis panel showed some reaction to aspergillus species. She went to [place] for a week to visit her daughter around Thanksgiving and she said she felt much better while up there. She had less cough and shortness of breath.  10/17/2012 ROV -- Hannah Kline is feeling a little better in that she has more energy.  She is still coughing, but not making as much sputum.  She is still somewhat short of breath, but again improved.  No fever, no chills.  No hemoptysis, no chest pain.  She is still taking the cipro.  She has not gotten the tobi yet.  11/14/12 ROV -- After finisihing the cipro Hannah Kline said that she started to feel better.  She feels that she has a lot of sinus disease and is getting a lot of plugs from sinuses.  They are typically yellow and sometimes cough worse when lying down. She usually gets up nasty stuff in after noon from her lungs but this is no worse than baseline.  Her chest congestion is worse every other day.  She denies allergic symptoms like itchy eyes, scratchy throat.    Her house has been  treated twice now for mold, as recent as 10 days ago.    She says that her breathing is good, but she sometimes gets out of breath with exertion.  She has gone back to exercising regularly.  04/10/2013 ROV > Hannah Kline says that the last 3 months have been OK. She has noticed increasing fatigue, and some shortness of breath in the last several weeks. She has not had increased cough but she does continue to have daily sputum production. She has occasional sinus mucus which is thick and green and clears well with Hannah Kline med rinses. She does not use Nasonex regularly. She took the inhaled TOBI but said that it made her  feel really sick. Specifically she had throat and tongue burning, a dry cough, nausea and vomiting, and increasing shortness of breath. She noted that her oxygen saturation level dropped to 82%. She took the drug for 11 days but unfortunately had to stop because she felt so miserable. She is slowly starting to feel better. She continues to use her Advair everyday and doesn't have any negative side effects from it. She saw Dr. Maple Hudson who states that there is no clear evidence of a mold allergy.  Despite all this, she states that she is feeling better than she was a year ago.  Past Medical History  Diagnosis Date  . Atrial fibrillation   . History of Mycobacterium avium complex infection     lung disease  . COPD (chronic obstructive pulmonary disease)    . Bronchiectasis   . Hepatitis     h/o INH  . Bacterial infection due to Pseudomonas   . Pleurisy 12/08/11     Review of Systems  Constitutional: Negative for fever, chills, fatigue and unexpected weight change.  HENT: Negative for ear pain, nosebleeds and congestion.   Respiratory: Positive for cough and shortness of breath. Negative for choking.   Cardiovascular: Negative for chest pain and leg swelling.       Objective:   Physical Exam  Filed Vitals:   04/10/13 1054  BP: 120/68  Pulse: 66  Temp: 98.2 F (36.8 C)  TempSrc: Oral  Height: 5\' 8"  (1.727 m)  Weight: 124 lb (56.246 kg)  SpO2: 96%   Gen: well appearing, no acute distress HEENT: NCAT, EOMi, OP clear,  PULM: Rhonchi bilaterally worse in upper lobes CV: Irreg irreg, no mgr, no JVD AB: BS+, soft, nontender, no hsm Ext: warm, no edema, no clubbing, no cyanosis  Review of 2012 sputum microbiology: Pan sensitive pseudomonas  09/2010 Full PFT: Ratio 63%, FEV1 1.49L (67% pred)  10/2011 CT Chest: impressive upper lobe cystic bronchiectasis, scattered lower lobe tree-in-bud abnormalities and scattered nodules; One RML solid nodule measures 7.55mm  January 2013 CT chest  [facility]: Stable upper lobe cystic bronchiectasis also present in the right middle lobe. Scattered tree in bud abnormalities and nodules throughout all lung fields roughly unchanged from prior. Right middle lobe nodule is no longer visible but there is an 8mm nodule in the right upper lobe.  December 2013 simple spirometry FEV1 0.8 L  10/11/2012 Sputum: PSEUDOMONAS AERUGINOSA       Antibiotic  Sensitivity  Microscan  Status    CEFEPIME  Sensitive  2  Final    CEFTAZIDIME  Sensitive  <=1  Final    CIPROFLOXACIN  Intermediate  2  Final    GENTAMICIN  Sensitive  <=1  Final    IMIPENEM  Sensitive  1  Final    LEVOFLOXACIN  Intermediate  4  Final    PIP/TAZO  Sensitive  <=4  Final    TOBRAMYCIN  Sensitive  <=1  Final    January 2014 simple spirometry >> not reproducible by ATS standards  January 2014 Sputum AFB > neg x3     Assessment & Plan:   BRONCHIECTASIS Unfortunately, Kileigh has been intolerant of inhaled tobramycin. At this point, I believe we have maximized evidence-based treatment for non-cystic fibrosis bronchiectasis. She does not have evidence of underlying allergic bronchopulmonary aspergillosis and she is currently not suffering from a flare.  I still believe that she would benefit from chronic antibiotic therapy to help suppress Pseudomonas. However, this is very difficult and not see a bronchiectatic patient has noticed drugs in that category our only approved for cystic fibrosis. Specifically, I think the use of inhaled aztreonam would be reasonable if not the use of chronic daily azithromycin.  Plan: -Continue Advair -Continue oxygen with exertion (encourage regular everyday use of this) -Continue pulmonary rehabilitation -Continue diet changes to try to increase weight -Stop inhaled TOBI  -f/u three months  C O P D She does not have COPD, she is bronchiectasis.  Pulmonary diseases due to other mycobacteria No indication for treatment based on normal cultures  11/2012  Rhinitis, nonallergic Advised using Nasonex regularly    Updated Medication List Outpatient Encounter Prescriptions as of 04/10/2013  Medication Sig Dispense Refill  . dabigatran (PRADAXA) 150 MG CAPS Take 1 capsule (150 mg total) by mouth every 12 (twelve) hours.  60 capsule  3  . digoxin (LANOXIN) 0.25 MG tablet Take 2 tablets today then take 1 tablet daily  90 tablet  3  . dorzolamide-timolol (COSOPT) 22.3-6.8 MG/ML ophthalmic solution Place 1 drop into the left eye every 12 (twelve) hours.       . flecainide (TAMBOCOR) 50 MG tablet Take 1 tablet (50 mg total) by mouth 2 (two) times daily.  60 tablet  11  . Fluticasone-Salmeterol (ADVAIR DISKUS) 250-50 MCG/DOSE AEPB 1 puff twice a day rinse mouth after each use  180 each  1  . latanoprost (XALATAN) 0.005 % ophthalmic solution Place 1 drop into the left eye At bedtime.      . levalbuterol (XOPENEX HFA) 45 MCG/ACT inhaler Inhale 2 puffs into the lungs every 4 (four) hours as needed.  3 Inhaler  3  . levothyroxine (SYNTHROID, LEVOTHROID) 75 MCG tablet Take 1 tablet (75 mcg total) by mouth daily.  90 tablet  3  . mirtazapine (REMERON) 15 MG tablet Take 1 tablet (15 mg total) by mouth at bedtime.  30 tablet  6  . mirtazapine (REMERON) 15 MG tablet TAKE 1 TABLET BY MOUTH AT BEDTIME.  30 tablet  2  . mometasone (NASONEX) 50 MCG/ACT nasal spray Place 2 sprays into the nose daily as needed.  17 g  5  . Saline 0.9 % AERS As directed      . [DISCONTINUED] Tobramycin (TOBI PODHALER) 28 MG CAPS Place 4 capsules into inhaler and inhale 2 (two) times daily.  240 capsule  5   No facility-administered encounter medications on file as of 04/10/2013.

## 2013-04-10 NOTE — Assessment & Plan Note (Signed)
Unfortunately, Hannah Kline has been intolerant of inhaled tobramycin. At this point, I believe we have maximized evidence-based treatment for non-cystic fibrosis bronchiectasis. She does not have evidence of underlying allergic bronchopulmonary aspergillosis and she is currently not suffering from a flare.  I still believe that she would benefit from chronic antibiotic therapy to help suppress Pseudomonas. However, this is very difficult and not see a bronchiectatic patient has noticed drugs in that category our only approved for cystic fibrosis. Specifically, I think the use of inhaled aztreonam would be reasonable if not the use of chronic daily azithromycin.  Plan: -Continue Advair -Continue oxygen with exertion (encourage regular everyday use of this) -Continue pulmonary rehabilitation -Continue diet changes to try to increase weight -Stop inhaled TOBI  -f/u three months

## 2013-04-10 NOTE — Patient Instructions (Signed)
We will see you back in 3-4 months or sooner if needed  Keep using your Advair and oxygen as you are doing  Try using your nasonex daily for two weeks to see if it helps with your sinus congestion

## 2013-04-11 NOTE — Telephone Encounter (Signed)
lmtcb x2 

## 2013-04-11 NOTE — Telephone Encounter (Signed)
Called, spoke with pt.  States Advair will need a Prior Auth.  We can call the # provided above.   I atc to do Prior Auth.  Spoke with Anette.  Was advised Advair is not preferred.  In order for this to be covered, pt will need a diagnosis of COPD or asthma and have tried and failed 2 of the following: symbicort, foridal, daliresp, combivent, spiriva, and atrovent.  Pt states she has been on spiriva in the past.  Per last OV note from BQ on 04/10/13: C O P D  She does not have COPD, she is bronchiectasis. -----  Dr. Kendrick Fries, pls advise if Advair can be changed.  Thank you.  ** Pt aware BQ is working nights.

## 2013-04-11 NOTE — Telephone Encounter (Signed)
Returning call.Hannah Kline ° °

## 2013-04-11 NOTE — Telephone Encounter (Signed)
Ask if bronchiectasis is an indication.  If not, we can give her a diagnosis of asthma and then state that she has failed spiriva. Otherwise, we may have to change her to Symbicort

## 2013-04-11 NOTE — Telephone Encounter (Signed)
Pt called back again; asks that nurse "please" call back asap. Hannah Kline

## 2013-04-12 ENCOUNTER — Encounter: Payer: Self-pay | Admitting: Internal Medicine

## 2013-04-12 NOTE — Telephone Encounter (Signed)
Let's go with the lower strength 80/4.5 two puffs bid  Thanks

## 2013-04-12 NOTE — Telephone Encounter (Signed)
Bronchiectasis did not work as a diagnosis either and even with the diagnosis of asthma the pt need to try and fil TWO of the covered options stated below and she has only failed one, the spiriva. Please advise what strength of symbicort you would like to change her to. Carron Curie, CMA

## 2013-04-13 MED ORDER — BUDESONIDE-FORMOTEROL FUMARATE 80-4.5 MCG/ACT IN AERO: 2.0000 | INHALATION_SPRAY | Freq: Two times a day (BID) | RESPIRATORY_TRACT | Status: DC

## 2013-04-13 NOTE — Telephone Encounter (Signed)
lmtcb x1 w/ family member  

## 2013-04-13 NOTE — Telephone Encounter (Signed)
Spoke with pt.  Informed her of below. She verbalized understanding. She is aware rx sent to [facility] in [facility] for the symbicort 80 to take 2 puffs bid and rinse mouth well after each use.   She is aware of symbicort inhaler technique. Advised to call office back if breathing worsens, if she has any problems with new rx, or for any additional questions or concerns.   She verbalized understanding and voiced no further questions or concerns at this time.

## 2013-04-19 ENCOUNTER — Telehealth: Payer: Self-pay | Admitting: Pulmonary Disease

## 2013-04-19 NOTE — Progress Notes (Signed)
Quick Note:  Left message w/ pt's spouse to have pt call back to discuss results. Per pt's chart, it appears that CY discussed these results with her:  Rhinitis, nonallergic - Waymon Budge, MD at 03/31/2013 7:30 PM   Status: Written Related Problem: Rhinitis, nonallergic   Negative allergy profile results makes atopic rhinitis extremely unlikely. If clinical suspicion remains, we can do allergy skin testing   C O P D - Waymon Budge, MD at 03/31/2013 7:32 PM   Status: Written Related Problem: C O P D   Negative allergy profile with low total IgE makes an atopic contribution to her lung disease unlikely. If clinical concern persists, we can do allergy skin testing.    ______

## 2013-04-19 NOTE — Telephone Encounter (Signed)
Result Notes    Notes Recorded by Sherre Lain, MA on 04/19/2013 at 3:18 PM Left message w/ pt's spouse to have pt call back to discuss results. Per pt's chart, it appears that CY discussed these results with her:  Rhinitis, nonallergic - Waymon Budge, MD at 03/31/2013 7:30 PM  Status: Written Related Problem: Rhinitis, nonallergic  Negative allergy profile results makes atopic rhinitis extremely unlikely. If clinical suspicion remains, we can do allergy skin testing  C O P D - Waymon Budge, MD at 03/31/2013 7:32 PM  Status: Written Related Problem: C O P D  Negative allergy profile with low total IgE makes an atopic contribution to her lung disease unlikely. If clinical concern persists, we can do allergy skin testing.    ------  Notes Recorded by Marcellus Scott, CMA on 04/11/2013 at 12:58 PM [email or web address] ------  Notes Recorded by Waymon Budge, MD on 04/09/2013 at 8:17 PM No allergy antibody elevations for common allergens including molds. We will discuss at next ov   ---- i called and spoke with pt. She stated these results were discussed with her at her OV. She needed nothing further

## 2013-05-01 ENCOUNTER — Encounter: Payer: Self-pay | Admitting: Pulmonary Disease

## 2013-05-20 ENCOUNTER — Other Ambulatory Visit: Payer: Self-pay | Admitting: Cardiovascular Disease

## 2013-07-13 DIAGNOSIS — H4010X0 Unspecified open-angle glaucoma, stage unspecified: Secondary | ICD-10-CM | POA: Diagnosis not present

## 2013-08-08 ENCOUNTER — Other Ambulatory Visit: Payer: Self-pay | Admitting: *Deleted

## 2013-08-08 MED ORDER — BUDESONIDE-FORMOTEROL FUMARATE 80-4.5 MCG/ACT IN AERO: 2.0000 | INHALATION_SPRAY | Freq: Two times a day (BID) | RESPIRATORY_TRACT | Status: DC

## 2013-08-09 DIAGNOSIS — Z23 Encounter for immunization: Secondary | ICD-10-CM | POA: Diagnosis not present

## 2013-08-09 IMAGING — CT CT CHEST W/ CM
1 series · 15 of 32 positions shown, 19 images · IV contrast (agent unspecified)
Comparison: none

REASON FOR EXAM: Labs 1st Hemoptysis bronchiectasis SOB eval for PE
COMMENTS:

PROCEDURE:     CT  - CT CHEST WITH CONTRAST  - August 08, 2012 [DATE]
RESULT:     History: Hemoptysis. Shortness of breath.
Comparison Study: Prior chest CT 05/02/2012

[Series 4: soft tissue · axial · 0.64mm/px · z∈[-377,-56]mm · 15 of 119 slices shown, 19 images]
[im 8/119  soft-tissue]
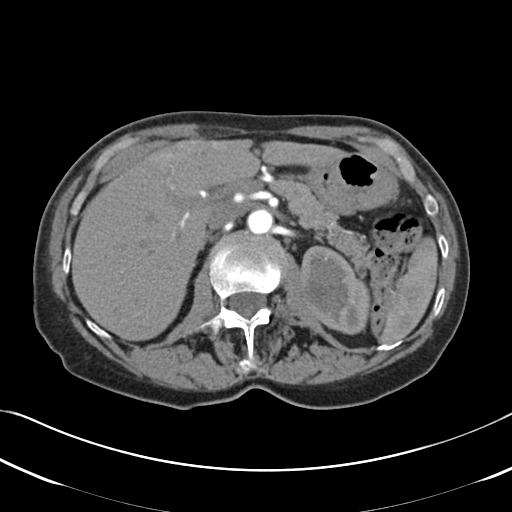
[im 8/119  bone]
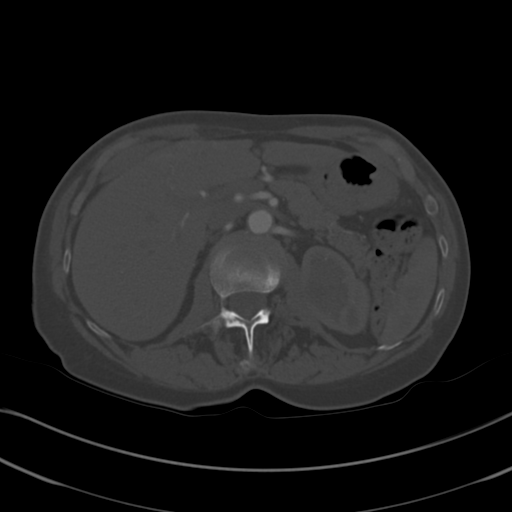
[im 16/119  soft-tissue]
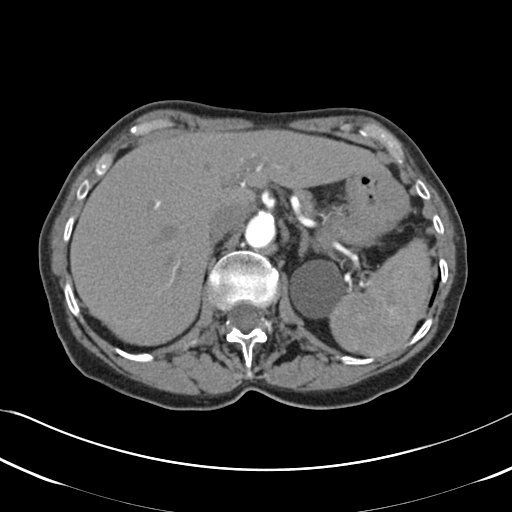
[im 23/119  soft-tissue]
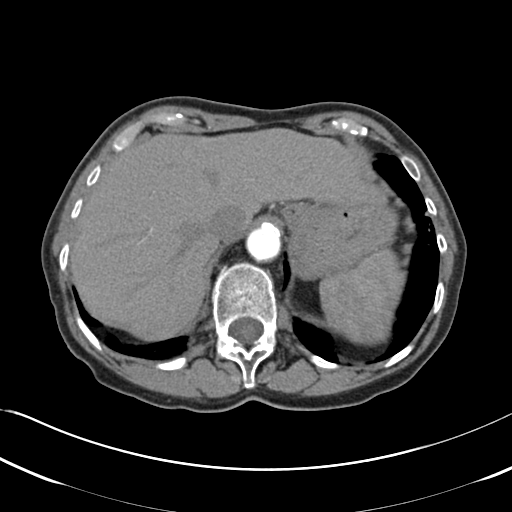
[im 35/119  soft-tissue]
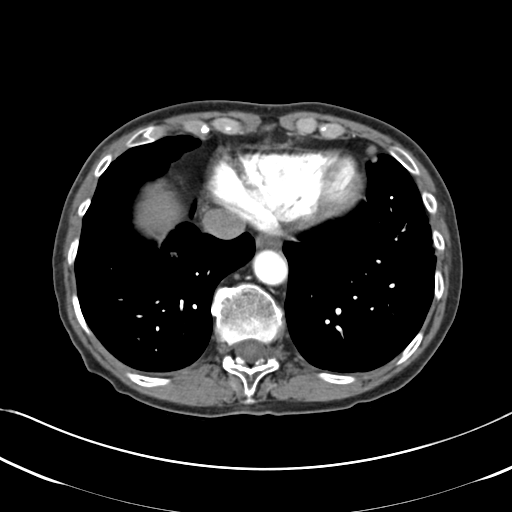
[im 42/119  soft-tissue]
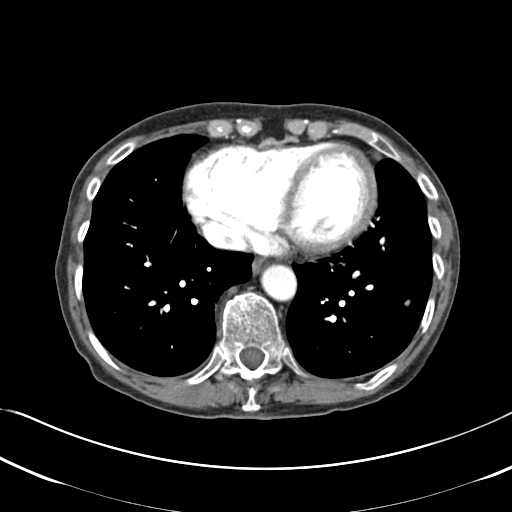
[im 50/119  soft-tissue]
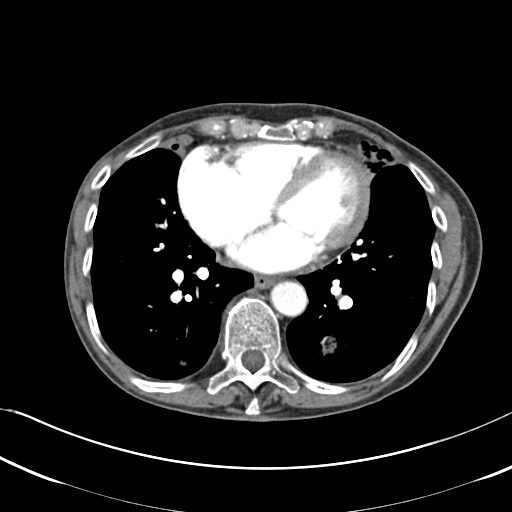
[im 61/119  soft-tissue]
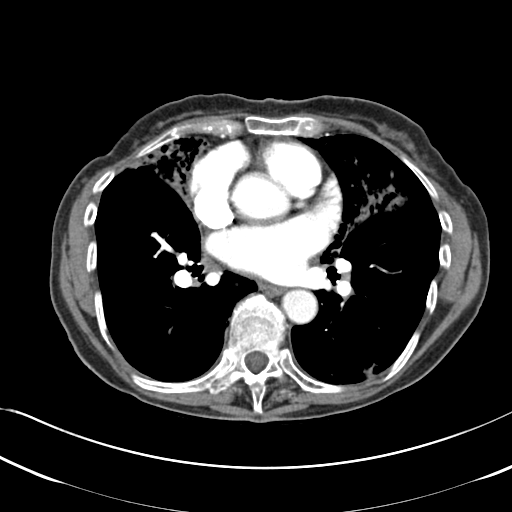
[im 69/119  soft-tissue]
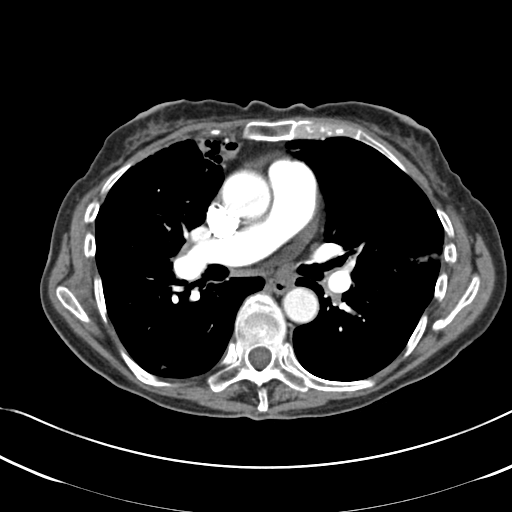
[im 77/119  soft-tissue]
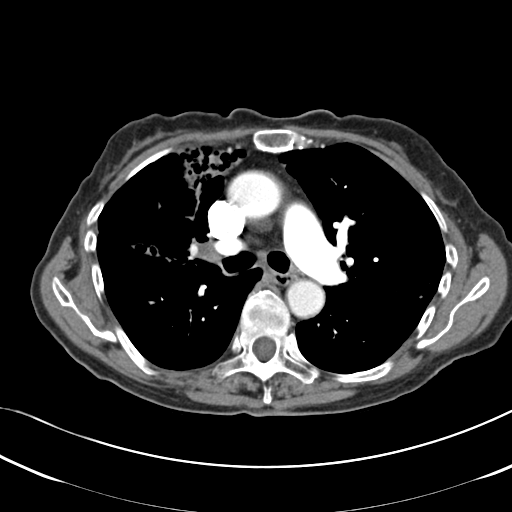
[im 77/119  bone]
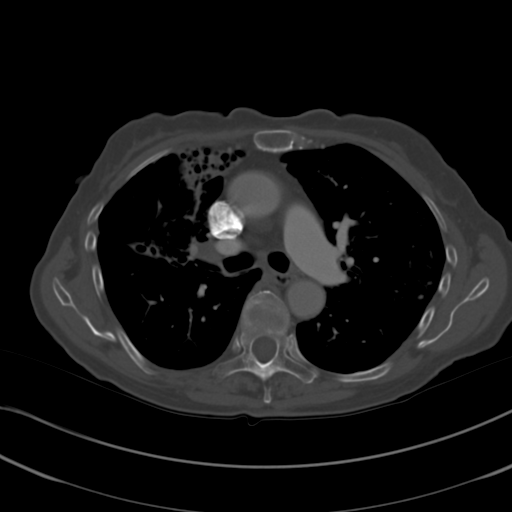
[im 84/119  soft-tissue]
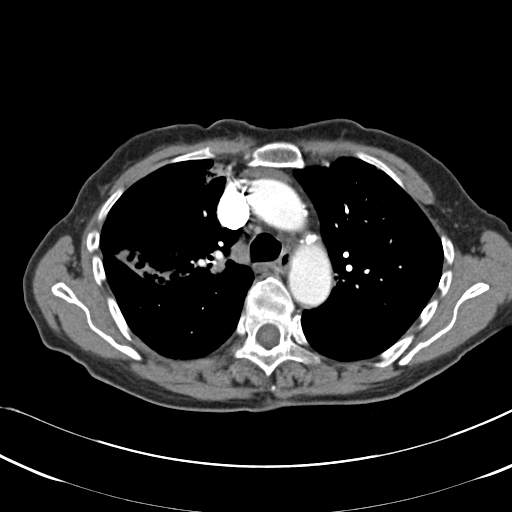
[im 96/119  soft-tissue]
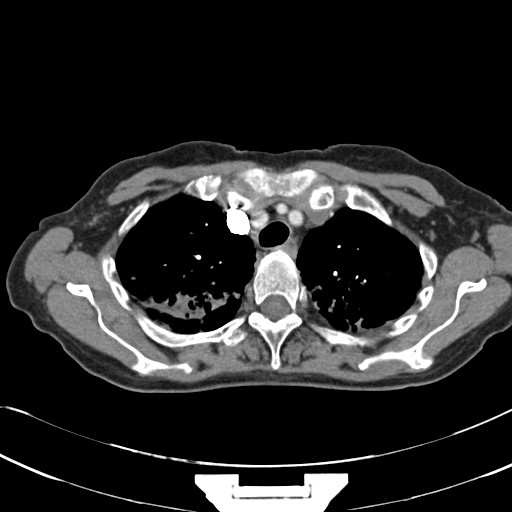
[im 103/119  soft-tissue]
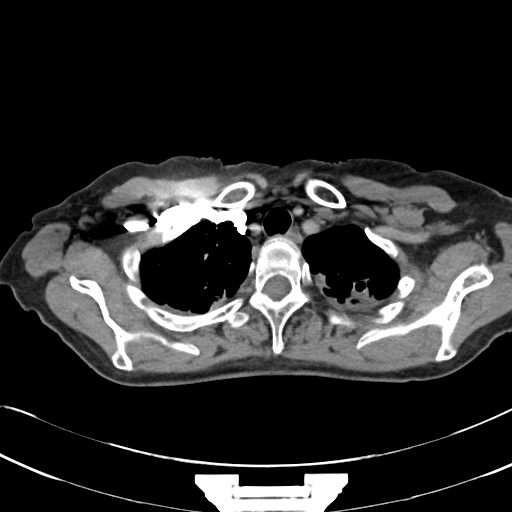
[im 103/119  lung]
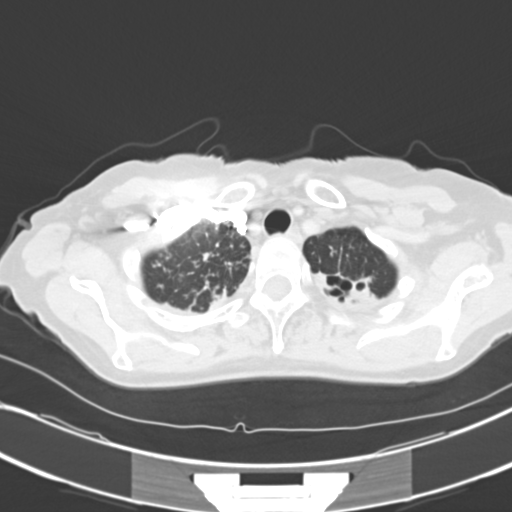
[im 107/119  lung]
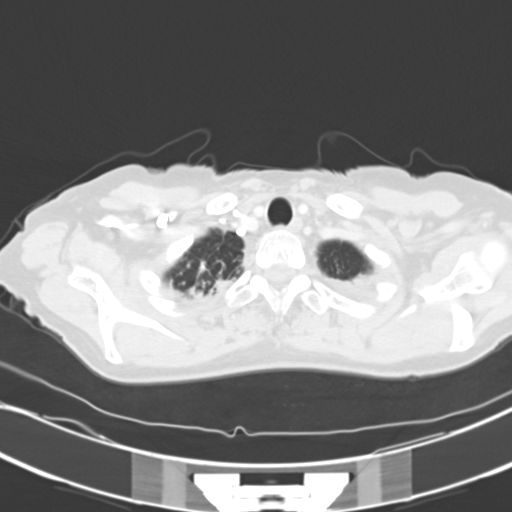
[im 111/119  soft-tissue]
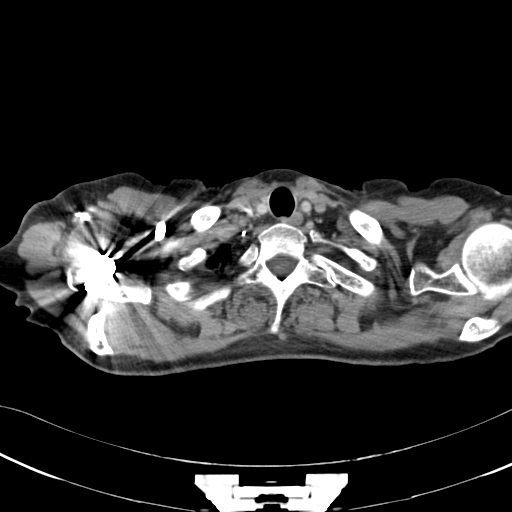
[im 111/119  lung]
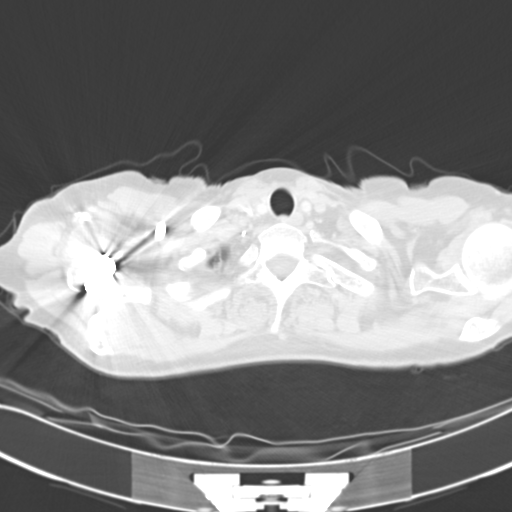
[im 115/119  lung]
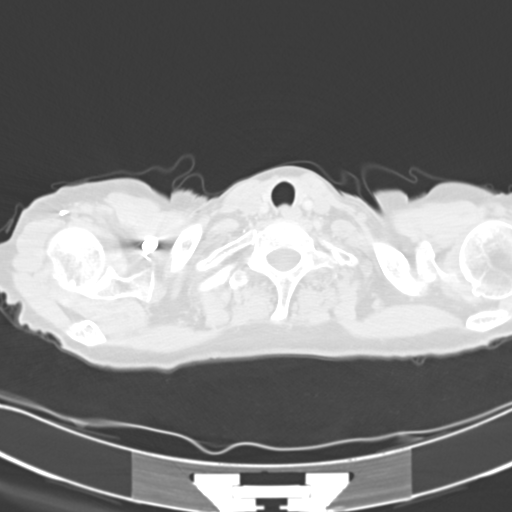

[15 of 32 positions shown; findings below may reference images not displayed]

FINDINGS: Standard CT obtained 75 cc of [id number] thoracic aorta is
unremarkable. Pulmonary arteries are normal. Prominent changes of cystic
bronchiectasis again noted in the upper lobes, lingula, right middle lobe.
Present identified nodular infiltrate right upper lobe is clear. There is a
new nodular infiltrate right middle lobe it is most likely secondary to
pneumonia. Similar findings noted in both lung bases. Other etiologies of
pulmonary or system infiltrates with nodularity including active
granulomatous disease cannot be excluded. Multiple pulmonary nodules are
present identified are otherwise stable. Adrenal glands are unremarkable.
Left renal cyst.
IMPRESSION: 1. New ill-defined infiltrates in the right middle lobe and both lung bases
suggesting foci of pneumonitis/pneumonia. Interim clearing of right upper
lobe infiltrate.
2. Prominence stable cystic bronchiectasis.
3. Multiple stable pulmonary nodules possibly granulomas disease although
malignancy not be excluded.
4. No evidence of pulmonary embolus.

## 2013-08-20 DIAGNOSIS — H4010X0 Unspecified open-angle glaucoma, stage unspecified: Secondary | ICD-10-CM | POA: Diagnosis not present

## 2013-08-21 ENCOUNTER — Other Ambulatory Visit: Payer: Self-pay | Admitting: Pulmonary Disease

## 2013-08-21 ENCOUNTER — Ambulatory Visit (INDEPENDENT_AMBULATORY_CARE_PROVIDER_SITE_OTHER): Payer: Medicare Other | Admitting: Pulmonary Disease

## 2013-08-21 ENCOUNTER — Encounter: Payer: Self-pay | Admitting: Pulmonary Disease

## 2013-08-21 VITALS — BP 128/70 | HR 75 | Temp 97.6°F | Ht 68.0 in | Wt 129.0 lb

## 2013-08-21 DIAGNOSIS — J479 Bronchiectasis, uncomplicated: Secondary | ICD-10-CM

## 2013-08-21 DIAGNOSIS — J329 Chronic sinusitis, unspecified: Secondary | ICD-10-CM

## 2013-08-21 MED ORDER — FLUTICASONE-SALMETEROL 250-50 MCG/DOSE IN AEPB: 1.0000 | INHALATION_SPRAY | Freq: Two times a day (BID) | RESPIRATORY_TRACT | Status: DC

## 2013-08-21 NOTE — Patient Instructions (Signed)
Please bring [place] three samples of mucus Stop Symbicort, start Advair Consider zyrtec daily, generic OK, for your sinus congestion  We will see you back in 3-4 months or sooner if needed

## 2013-08-21 NOTE — Assessment & Plan Note (Signed)
I advised that she use zyrtec in addition to Nasonex and Saline rinses.

## 2013-08-21 NOTE — Assessment & Plan Note (Signed)
Hannah Kline has had a slight increase in her mucus production in the last few months. I wonder if this is related to Symbicort since she had to change to this for insurance reasons.  Her weight is up in her breathing is getting better so I do not feel she has a flare of bronchiectasis. Also, recurrence of MAI is unlikely based on increased mucus alone.  Plan: -Surveillance AFB cultures -Fungal culture -Change from Symbicort to Advair -Consider monthly oral antibiotic - Flu shot up-to-date -Followup with me 3-4 months

## 2013-08-21 NOTE — Progress Notes (Signed)
Subjective:    Patient ID: Hannah Kline, female    DOB: July 31, 1932, 79 y.o.   MRN: [id number] Synopsis: Hannah Kline established care with the [facility] pulmonary office in February 2013 for bronchiectasis due to [place]. She was diagnosed at age 68 in [place]. She was treated with rifampin ethambutol and clarithromycin for 2 years. Since then she does not believe she was ever grown out MAI.   HPI  7/8 routine office visit--Mrs. [name] says that she's doing quite well and is now exercising 5 days a week. She is using pulse oximetry and states that usually during her exercise routine her oxygen stays above 92% on room air. However sometimes with her weight routine it drops below 88%. She takes albuterol before going to class and this helps quite a bit. She does not think that the Cipro has helped and she would like to stop. She continues to cough daily often getting up one half cup of sputum per day. It is typically pink but lately it has been yellow. She denies fevers chills. She is frustrated that her weight will not go up from 130 pounds.  08/07/12 Sick visit -- [name] comes to clinic today because last week she developed hemoptysis while visiting [place]. She states that she took a train from [place] to [place] on Monday and and felt some degree of shortness of breath while walking around in town. On Tuesday evening after dinner she felt the need to clear her throat and then noticed that she had a mouthful of bright red blood. She had cough productive of bright red blood for several days thereafter and then this eventually turned into a peak colored sputum. At the time she had a chill but no chest pain. She denies leg pain or swelling. She states that today her sputum is clear. Otherwise she has not had an increase in sputum production. She thinks that the Nasonex has helped with her cough.  10/10/2012 ROV -- Hannah Kline has had a rough time. She did not have  improvement with the Cipro we gave her earlier in November. She has not had further episodes of coughing up blood but she has had increased sputum production. This is been associated with weight loss, fatigue, and chills. She has noticed mold in her home recently and a recent hypersensitivity pneumonitis panel showed some reaction to aspergillus species. She went to [place] for a week to visit her daughter around Thanksgiving and she said she felt much better while up there. She had less cough and shortness of breath.  10/17/2012 ROV -- Hannah Kline is feeling a little better in that she has more energy.  She is still coughing, but not making as much sputum.  She is still somewhat short of breath, but again improved.  No fever, no chills.  No hemoptysis, no chest pain.  She is still taking the cipro.  She has not gotten the tobi yet.  11/14/12 ROV -- After finisihing the cipro Hannah Kline said that she started to feel better.  She feels that she has a lot of sinus disease and is getting a lot of plugs from sinuses.  They are typically yellow and sometimes cough worse when lying down. She usually gets up nasty stuff in after noon from her lungs but this is no worse than baseline.  Her chest congestion is worse every other day.  She denies allergic symptoms like itchy eyes, scratchy throat.    Her house has been  treated twice now for mold, as recent as 10 days ago.    She says that her breathing is good, but she sometimes gets out of breath with exertion.  She has gone back to exercising regularly.  04/10/2013 ROV > Hannah Kline says that the last 3 months have been OK. She has noticed increasing fatigue, and some shortness of breath in the last several weeks. She has not had increased cough but she does continue to have daily sputum production. She has occasional sinus mucus which is thick and green and clears well with Lloyd Huger med rinses. She does not use Nasonex regularly. She took the inhaled TOBI but said that it made her  feel really sick. Specifically she had throat and tongue burning, a dry cough, nausea and vomiting, and increasing shortness of breath. She noted that her oxygen saturation level dropped to 82%. She took the drug for 11 days but unfortunately had to stop because she felt so miserable. She is slowly starting to feel better. She continues to use her Advair everyday and doesn't have any negative side effects from it. She saw Dr. Maple Hudson who states that there is no clear evidence of a mold allergy.  Despite all this, she states that she is feeling better than she was a year ago.  08/21/2013 ROV > Hannah Kline says that her breathing is doing well since the last visit. She has been attending 829 exercise classes a week. She monitors her oxygen while exercising and it rarely goes below 95%. She will use oxygen when she feels particularly short of breath. In the last 3-4 months she hasn't increased mucus production. It will typically last throughout the day but sometimes if she doesn't cough up and up mucus she will have post tussive emesis after dinner. She does not feel that the active heating makes her cough worse. She does not have trouble swallowing or choking on food. She has actually gained a few pounds since the last visit. She has no appetite but despite this she still eats 3 meals a day and uses nutritional supplements. Sinus congestion has continued to be a problem. She continues to use a Neti pot which helps.  Past Medical History  Diagnosis Date  . Atrial fibrillation   . History of Mycobacterium avium complex infection     lung disease  . COPD (chronic obstructive pulmonary disease)    . Bronchiectasis   . Hepatitis     h/o INH  . Bacterial infection due to Pseudomonas   . Pleurisy 12/08/11     Review of Systems  Constitutional: Negative for fever, chills, fatigue and unexpected weight change.  HENT: Positive for congestion. Negative for ear pain and nosebleeds.   Respiratory: Positive for cough.  Negative for choking and shortness of breath.   Cardiovascular: Negative for chest pain and leg swelling.       Objective:   Physical Exam  Filed Vitals:   08/21/13 1156  BP: 128/70  Pulse: 75  Temp: 97.6 F (36.4 C)  TempSrc: Oral  Height: 5\' 8"  (1.727 m)  Weight: 129 lb (58.514 kg)  SpO2: 93%   Gen: well appearing, no acute distress HEENT: NCAT, EOMi, OP clear,  PULM: Rhonchi bilaterally worse in upper lobes CV: Irreg irreg, no mgr, no JVD AB: BS+, soft, nontender, no hsm Ext: warm, no edema, no clubbing, no cyanosis  Review of 2012 sputum microbiology: Pan sensitive pseudomonas  09/2010 Full PFT: Ratio 63%, FEV1 1.49L (67% pred)  10/2011 CT Chest: impressive upper  lobe cystic bronchiectasis, scattered lower lobe tree-in-bud abnormalities and scattered nodules; One RML solid nodule measures 7.66mm  January 2013 CT chest [facility]: Stable upper lobe cystic bronchiectasis also present in the right middle lobe. Scattered tree in bud abnormalities and nodules throughout all lung fields roughly unchanged from prior. Right middle lobe nodule is no longer visible but there is an 8mm nodule in the right upper lobe.  December 2013 simple spirometry FEV1 0.8 L  10/11/2012 Sputum: PSEUDOMONAS AERUGINOSA       Antibiotic  Sensitivity  Microscan  Status    CEFEPIME  Sensitive  2  Final    CEFTAZIDIME  Sensitive  <=1  Final    CIPROFLOXACIN  Intermediate  2  Final    GENTAMICIN  Sensitive  <=1  Final    IMIPENEM  Sensitive  1  Final    LEVOFLOXACIN  Intermediate  4  Final    PIP/TAZO  Sensitive  <=4  Final    TOBRAMYCIN  Sensitive  <=1  Final    January 2014 simple spirometry >> not reproducible by ATS standards  January 2014 Sputum AFB > neg x3     Assessment & Plan:   BRONCHIECTASIS Karalina has had a slight increase in her mucus production in the last few months. I wonder if this is related to Symbicort since she had to change to this for insurance reasons.  Her weight is up  in her breathing is getting better so I do not feel she has a flare of bronchiectasis. Also, recurrence of MAI is unlikely based on increased mucus alone.  Plan: -Surveillance AFB cultures -Fungal culture -Change from Symbicort to Advair -Consider monthly oral antibiotic - Flu shot up-to-date -Followup with me 3-4 months  Chronic sinusitis I advised that she use zyrtec in addition to Nasonex and Saline rinses.    Updated Medication List Outpatient Encounter Prescriptions as of 08/21/2013  Medication Sig Dispense Refill  . dabigatran (PRADAXA) 150 MG CAPS capsule Take 150 mg by mouth 2 (two) times daily as needed.      . dorzolamide-timolol (COSOPT) 22.3-6.8 MG/ML ophthalmic solution Place 1 drop into the left eye every 12 (twelve) hours.       . flecainide (TAMBOCOR) 50 MG tablet Take 1 tablet (50 mg total) by mouth 2 (two) times daily.  60 tablet  11  . latanoprost (XALATAN) 0.005 % ophthalmic solution Place 1 drop into the left eye At bedtime.      . levalbuterol (XOPENEX HFA) 45 MCG/ACT inhaler Inhale 2 puffs into the lungs every 4 (four) hours as needed.  3 Inhaler  3  . levothyroxine (SYNTHROID, LEVOTHROID) 75 MCG tablet Take 1 tablet (75 mcg total) by mouth daily.  90 tablet  3  . mirtazapine (REMERON) 15 MG tablet TAKE 1 TABLET BY MOUTH AT BEDTIME.  30 tablet  2  . mometasone (NASONEX) 50 MCG/ACT nasal spray Place 2 sprays into the nose daily as needed.  17 g  5  . Saline 0.9 % AERS As directed      . [DISCONTINUED] budesonide-formoterol (SYMBICORT) 80-4.5 MCG/ACT inhaler Inhale 2 puffs into the lungs 2 (two) times daily.  1 Inhaler  5  . [DISCONTINUED] dabigatran (PRADAXA) 150 MG CAPS Take 1 capsule (150 mg total) by mouth every 12 (twelve) hours.  60 capsule  3  . Fluticasone-Salmeterol (ADVAIR DISKUS) 250-50 MCG/DOSE AEPB Inhale 1 puff into the lungs 2 (two) times daily.  1 each  5  . [DISCONTINUED] digoxin (LANOXIN) 0.25  MG tablet Take 2 tablets today then take 1 tablet daily   90 tablet  3  . [DISCONTINUED] flecainide (TAMBOCOR) 50 MG tablet TAKE 1 TABLET BY MOUTH TWICE A DAY  60 tablet  6  . [DISCONTINUED] mirtazapine (REMERON) 15 MG tablet Take 1 tablet (15 mg total) by mouth at bedtime.  30 tablet  6   No facility-administered encounter medications on file as of 08/21/2013.

## 2013-08-23 ENCOUNTER — Other Ambulatory Visit (INDEPENDENT_AMBULATORY_CARE_PROVIDER_SITE_OTHER): Payer: Medicare Other

## 2013-08-23 DIAGNOSIS — J479 Bronchiectasis, uncomplicated: Secondary | ICD-10-CM

## 2013-08-28 ENCOUNTER — Other Ambulatory Visit (INDEPENDENT_AMBULATORY_CARE_PROVIDER_SITE_OTHER): Payer: Medicare Other

## 2013-08-28 DIAGNOSIS — J479 Bronchiectasis, uncomplicated: Secondary | ICD-10-CM | POA: Diagnosis not present

## 2013-08-29 NOTE — Addendum Note (Signed)
Addended by: Montine Circle D on: 08/29/2013 03:30 PM   Modules accepted: Orders

## 2013-09-04 NOTE — Progress Notes (Signed)
Quick Note:  Advised pt of results per DM. Pt verbalized understanding and has no further questions at this time ______

## 2013-09-18 ENCOUNTER — Other Ambulatory Visit: Payer: Self-pay | Admitting: Internal Medicine

## 2013-09-18 NOTE — Telephone Encounter (Signed)
Eprescribed.

## 2013-09-19 ENCOUNTER — Ambulatory Visit (INDEPENDENT_AMBULATORY_CARE_PROVIDER_SITE_OTHER): Payer: Medicare Other | Admitting: Cardiovascular Disease

## 2013-09-19 ENCOUNTER — Other Ambulatory Visit: Payer: Self-pay | Admitting: Internal Medicine

## 2013-09-19 ENCOUNTER — Encounter: Payer: Self-pay | Admitting: Cardiovascular Disease

## 2013-09-19 VITALS — BP 110/58 | HR 68 | Ht 68.0 in | Wt 129.5 lb

## 2013-09-19 DIAGNOSIS — I951 Orthostatic hypotension: Secondary | ICD-10-CM | POA: Diagnosis not present

## 2013-09-19 DIAGNOSIS — J479 Bronchiectasis, uncomplicated: Secondary | ICD-10-CM | POA: Diagnosis not present

## 2013-09-19 DIAGNOSIS — I4891 Unspecified atrial fibrillation: Secondary | ICD-10-CM

## 2013-09-19 MED ORDER — DIGOXIN 250 MCG PO TABS: 0.2500 mg | ORAL_TABLET | Freq: Every day | ORAL | Status: DC | PRN

## 2013-09-19 NOTE — Progress Notes (Signed)
Patient ID: Hannah Kline, female    DOB: 12/08/1931, 79 y.o.   MRN: [id number]  HPI Comments: Hannah Kline is a very pleasant 79 year old woman with a remote history of Mycobacterium avium with residual lung disease/Bronchiectasis, on inhalers, with a history of atrial fibrillation, who presents for followup of her  atrial fibrillation.  In the past, she has failed sotalol. Amiodarone was discontinued given underlying lung disease. She was started on  Flecainide 50 mg twice a day with good success.  Episode of atrial fibrillation on April 27th to the 29th 2014. She came to the office, had an EKG. She took digoxin x2,  extra flecainide. This was the longest episode of atrial fibrillation she has had. She is symptomatic in atrial fibrillation. She was also given pradaxa and took his twice a day.   Since her last clinic visit in May 2014, she has had one episode of atrial fibrillation. She took extra flecainide, digoxin, pradaxa 1 dose and bystolic 5 mg pill. Symptoms started at 5 PM, she went  to bed and when she woke up in the morning her atrial fibrillation had resolved.  Weight has been stable. Remeron previously and 8. She has weaned herself off the Remeron as she did not feel that it was helping. Weight is up 5 pounds She continues to have chronic shortness of breath and cough on a daily basis. Stable  Other  Episodes of tachycardia in July 2013, October 2013.   EKG shows Normal sinus rhythm with rate 68 beats per minute with incomplete right bundle branch block, left anterior fascicular block    Outpatient Encounter Prescriptions as of 09/19/2013  Medication Sig  . dabigatran (PRADAXA) 150 MG CAPS capsule Take 150 mg by mouth 2 (two) times daily as needed.  . dorzolamide-timolol (COSOPT) 22.3-6.8 MG/ML ophthalmic solution Place 1 drop into the left eye every 12 (twelve) hours.   . flecainide (TAMBOCOR) 50 MG tablet Take 1 tablet (50 mg total) by mouth 2 (two) times daily.  .  Fluticasone-Salmeterol (ADVAIR) 250-50 MCG/DOSE AEPB Inhale 1 puff into the lungs as needed.  . latanoprost (XALATAN) 0.005 % ophthalmic solution Place 1 drop into the left eye At bedtime.  . levalbuterol (XOPENEX HFA) 45 MCG/ACT inhaler Inhale 2 puffs into the lungs every 4 (four) hours as needed.  Marland Kitchen levothyroxine (SYNTHROID, LEVOTHROID) 75 MCG tablet Take 1 tablet (75 mcg total) by mouth daily.  . mometasone (NASONEX) 50 MCG/ACT nasal spray Place 2 sprays into the nose daily as needed.  . Saline 0.9 % AERS As directed   Review of Systems  Constitutional: Positive for fatigue.  HENT: Negative.   Eyes: Negative.   Cardiovascular: Negative.   Gastrointestinal: Negative.   Musculoskeletal: Negative.   Skin: Negative.   Psychiatric/Behavioral: Negative.   All other systems reviewed and are negative.     BP 110/58  Pulse 68  Ht 5\' 8"  (1.727 m)  Wt 129 lb 8 oz (58.741 kg)  BMI 19.70 kg/m2  Physical Exam  Nursing note and vitals reviewed. Constitutional: She is oriented to person, place, and time. She appears well-developed and well-nourished.  HENT:  Head: Normocephalic.  Nose: Nose normal.  Mouth/Throat: Oropharynx is clear and moist.  Eyes: Conjunctivae are normal. Pupils are equal, round, and reactive to light.  Neck: Normal range of motion. Neck supple. No JVD present.  Cardiovascular: Normal rate, regular rhythm, S1 normal, S2 normal, normal heart sounds and intact distal pulses.  Exam reveals no gallop and no friction rub.  No murmur heard. Pulmonary/Chest: Effort normal. No respiratory distress. She has no wheezes. She exhibits no tenderness.  Abdominal: Soft. Bowel sounds are normal. She exhibits no distension. There is no tenderness.  Musculoskeletal: Normal range of motion. She exhibits no edema and no tenderness.  Lymphadenopathy:    She has no cervical adenopathy.  Neurological: She is alert and oriented to person, place, and time. Coordination normal.  Skin: Skin  is warm and dry. No rash noted. No erythema.  Psychiatric: She has a normal mood and affect. Her behavior is normal. Judgment and thought content normal.    Assessment and Plan

## 2013-09-19 NOTE — Assessment & Plan Note (Signed)
She feels her symptoms are stable. Followed by pulmonary

## 2013-09-19 NOTE — Patient Instructions (Addendum)
You are doing well. No medication changes were made.  Please call us if you have new issues that need to be addressed before your next appt.  Your physician wants you to follow-up in: 6 months.  You will receive a reminder letter in the mail two months in advance. If you don't receive a letter, please call our office to schedule the follow-up appointment.   

## 2013-09-19 NOTE — Assessment & Plan Note (Signed)
No dizziness. Blood pressure has improved with recent weight gain. She is drinking fluids, salt loading at times

## 2013-09-19 NOTE — Assessment & Plan Note (Signed)
1 recent episode of atrial fibrillation improved with when necessary medications. Overall doing well. No changes made to her medications

## 2013-09-21 ENCOUNTER — Other Ambulatory Visit: Payer: Self-pay | Admitting: Internal Medicine

## 2013-09-21 DIAGNOSIS — E039 Hypothyroidism, unspecified: Secondary | ICD-10-CM

## 2013-09-21 DIAGNOSIS — Z79899 Other long term (current) drug therapy: Secondary | ICD-10-CM

## 2013-09-21 DIAGNOSIS — E785 Hyperlipidemia, unspecified: Secondary | ICD-10-CM

## 2013-09-21 DIAGNOSIS — E559 Vitamin D deficiency, unspecified: Secondary | ICD-10-CM

## 2013-09-21 NOTE — Telephone Encounter (Signed)
Refill? LAst office visit 12/26/12

## 2013-09-21 NOTE — Telephone Encounter (Signed)
Okay to refill? I do not see any recent TSH or office visit-please advise

## 2013-09-24 ENCOUNTER — Other Ambulatory Visit: Payer: Self-pay | Admitting: Internal Medicine

## 2013-09-24 ENCOUNTER — Encounter: Payer: Self-pay | Admitting: *Deleted

## 2013-09-24 NOTE — Telephone Encounter (Signed)
30 day refill only,  Needs TSH and fasting labs prior to any more refills

## 2013-09-25 ENCOUNTER — Encounter: Payer: Self-pay | Admitting: *Deleted

## 2013-09-25 NOTE — Telephone Encounter (Signed)
Sent mychart message

## 2013-09-27 NOTE — Telephone Encounter (Signed)
Mailed unread message to pt  

## 2013-10-05 LAB — AFB CULTURE WITH SMEAR (NOT AT ARMC): Acid Fast Smear: NONE SEEN

## 2013-10-09 ENCOUNTER — Other Ambulatory Visit (INDEPENDENT_AMBULATORY_CARE_PROVIDER_SITE_OTHER): Payer: Medicare Other

## 2013-10-09 ENCOUNTER — Telehealth: Payer: Self-pay

## 2013-10-09 ENCOUNTER — Encounter: Payer: Self-pay | Admitting: Internal Medicine

## 2013-10-09 DIAGNOSIS — E039 Hypothyroidism, unspecified: Secondary | ICD-10-CM

## 2013-10-09 DIAGNOSIS — Z79899 Other long term (current) drug therapy: Secondary | ICD-10-CM

## 2013-10-09 DIAGNOSIS — E785 Hyperlipidemia, unspecified: Secondary | ICD-10-CM

## 2013-10-09 DIAGNOSIS — E559 Vitamin D deficiency, unspecified: Secondary | ICD-10-CM

## 2013-10-09 LAB — CBC WITH DIFFERENTIAL/PLATELET
Basophils Absolute: 0.1 10*3/uL (ref 0.0–0.1)
Basophils Relative: 0.5 % (ref 0.0–3.0)
Eosinophils Absolute: 0.4 10*3/uL (ref 0.0–0.7)
Eosinophils Relative: 3.3 % (ref 0.0–5.0)
HCT: 39.3 % (ref 36.0–46.0)
Hemoglobin: 13.1 g/dL (ref 12.0–15.0)
Lymphocytes Relative: 14.4 % (ref 12.0–46.0)
Lymphs Abs: 1.6 10*3/uL (ref 0.7–4.0)
MCHC: 33.4 g/dL (ref 30.0–36.0)
MCV: 93.3 fl (ref 78.0–100.0)
Monocytes Absolute: 0.8 10*3/uL (ref 0.1–1.0)
Neutro Abs: 8.5 10*3/uL — ABNORMAL HIGH (ref 1.4–7.7)
RBC: 4.21 Mil/uL (ref 3.87–5.11)
RDW: 14.6 % (ref 11.5–14.6)

## 2013-10-09 LAB — COMPREHENSIVE METABOLIC PANEL
AST: 18 U/L (ref 0–37)
Albumin: 3.4 g/dL — ABNORMAL LOW (ref 3.5–5.2)
Alkaline Phosphatase: 73 U/L (ref 39–117)
BUN: 16 mg/dL (ref 6–23)
Calcium: 8.8 mg/dL (ref 8.4–10.5)
Chloride: 104 mEq/L (ref 96–112)
Creatinine, Ser: 0.8 mg/dL (ref 0.4–1.2)
GFR: 72.44 mL/min (ref >60.00)
Potassium: 4.1 mEq/L (ref 3.5–5.1)
Total Bilirubin: 0.6 mg/dL (ref 0.3–1.2)

## 2013-10-09 LAB — LIPID PANEL: VLDL: 14 mg/dL (ref 0.0–40.0)

## 2013-10-09 MED ORDER — LEVOTHYROXINE SODIUM 75 MCG PO TABS: ORAL_TABLET | ORAL | Status: DC

## 2013-10-09 NOTE — Addendum Note (Signed)
Addended by: Sherlene Shams on: 10/09/2013 01:47 PM   Modules accepted: Orders

## 2013-10-09 NOTE — Telephone Encounter (Signed)
Pt aware of negative culture.  No further action needed. Caulfield,Ashley L

## 2013-10-09 NOTE — Telephone Encounter (Signed)
Message copied by Velvet Bathe on Tue Oct 09, 2013  9:29 AM ------      Message from: Lupita Leash      Created: Tue Oct 09, 2013  8:39 AM       A,      Please let her know that her culture was negative.      Thanks,      B ------

## 2013-10-10 LAB — VITAMIN D 25 HYDROXY (VIT D DEFICIENCY, FRACTURES): Vit D, 25-Hydroxy: 36 ng/mL (ref 30–89)

## 2013-10-11 LAB — AFB CULTURE WITH SMEAR (NOT AT ARMC): Acid Fast Smear: NONE SEEN

## 2013-10-11 NOTE — Telephone Encounter (Signed)
Mailed unread message to pt  

## 2013-10-23 ENCOUNTER — Ambulatory Visit (INDEPENDENT_AMBULATORY_CARE_PROVIDER_SITE_OTHER): Payer: Medicare Other | Admitting: Pulmonary Disease

## 2013-10-23 ENCOUNTER — Telehealth: Payer: Self-pay | Admitting: Pulmonary Disease

## 2013-10-23 ENCOUNTER — Encounter: Payer: Self-pay | Admitting: Pulmonary Disease

## 2013-10-23 VITALS — BP 134/74 | HR 66 | Ht 68.0 in | Wt 130.0 lb

## 2013-10-23 DIAGNOSIS — J471 Bronchiectasis with (acute) exacerbation: Secondary | ICD-10-CM

## 2013-10-23 MED ORDER — CIPROFLOXACIN HCL 750 MG PO TABS: 750.0000 mg | ORAL_TABLET | Freq: Two times a day (BID) | ORAL | Status: AC

## 2013-10-23 MED ORDER — PREDNISONE 10 MG PO TABS: ORAL_TABLET | ORAL | Status: DC

## 2013-10-23 NOTE — Patient Instructions (Signed)
Take the Cipro 750mg  twice a day with a probiotic for 10 days Take the prednisone 30mg  daily for three days, then 20mg  daily for three days  If you are getting worse (increasing blood in sputum, fever, worsening shortness of breath), then call me so we can start IV antibiotics  We will see you back as previously scheduled

## 2013-10-23 NOTE — Telephone Encounter (Signed)
I called and spoke with pt. She reports she coughed up a "blood clot" yesterday one time only. Today she is still coughing up blood tinged sputum. Per TD okay to overbook for pt to see BQ in [place] today. Nothing further needed

## 2013-10-23 NOTE — Progress Notes (Signed)
Subjective:    Patient ID: Hannah Kline, female    DOB: July 31, 1932, 79 y.o.   MRN: [id number] Synopsis: Hannah Kline established care with the [facility] pulmonary office in February 2013 for bronchiectasis due to [place]. She was diagnosed at age 68 in [place]. She was treated with rifampin ethambutol and clarithromycin for 2 years. Since then she does not believe she was ever grown out MAI.   HPI  7/8 routine office visit--Hannah Kline says that she's doing quite well and is now exercising 5 days a week. She is using pulse oximetry and states that usually during her exercise routine her oxygen stays above 92% on room air. However sometimes with her weight routine it drops below 88%. She takes albuterol before going to class and this helps quite a bit. She does not think that the Cipro has helped and she would like to stop. She continues to cough daily often getting up one half cup of sputum per day. It is typically pink but lately it has been yellow. She denies fevers chills. She is frustrated that her weight will not go up from 130 pounds.  08/07/12 Sick visit -- Hannah Kline comes to clinic today because last week she developed hemoptysis while visiting [place]. She states that she took a train from [place] to [place] on Monday and and felt some degree of shortness of breath while walking around in town. On Tuesday evening after dinner she felt the need to clear her throat and then noticed that she had a mouthful of bright red blood. She had cough productive of bright red blood for several days thereafter and then this eventually turned into a peak colored sputum. At the time she had a chill but no chest pain. She denies leg pain or swelling. She states that today her sputum is clear. Otherwise she has not had an increase in sputum production. She thinks that the Nasonex has helped with her cough.  10/10/2012 ROV -- Hannah Kline has had a rough time. She did not have  improvement with the Cipro we gave her earlier in November. She has not had further episodes of coughing up blood but she has had increased sputum production. This is been associated with weight loss, fatigue, and chills. She has noticed mold in her home recently and a recent hypersensitivity pneumonitis panel showed some reaction to aspergillus species. She went to [place] for a week to visit her daughter around Thanksgiving and she said she felt much better while up there. She had less cough and shortness of breath.  10/17/2012 ROV -- Hannah Kline is feeling a little better in that she has more energy.  She is still coughing, but not making as much sputum.  She is still somewhat short of breath, but again improved.  No fever, no chills.  No hemoptysis, no chest pain.  She is still taking the cipro.  She has not gotten the tobi yet.  11/14/12 ROV -- After finisihing the cipro Hannah Kline said that she started to feel better.  She feels that she has a lot of sinus disease and is getting a lot of plugs from sinuses.  They are typically yellow and sometimes cough worse when lying down. She usually gets up nasty stuff in after noon from her lungs but this is no worse than baseline.  Her chest congestion is worse every other day.  She denies allergic symptoms like itchy eyes, scratchy throat.    Her house has been  treated twice now for mold, as recent as 10 days ago.    She says that her breathing is good, but she sometimes gets out of breath with exertion.  She has gone back to exercising regularly.  04/10/2013 ROV > Hannah Kline says that the last 3 months have been OK. She has noticed increasing fatigue, and some shortness of breath in the last several weeks. She has not had increased cough but she does continue to have daily sputum production. She has occasional sinus mucus which is thick and green and clears well with Lloyd Huger med rinses. She does not use Nasonex regularly. She took the inhaled TOBI but said that it made her  feel really sick. Specifically she had throat and tongue burning, a dry cough, nausea and vomiting, and increasing shortness of breath. She noted that her oxygen saturation level dropped to 82%. She took the drug for 11 days but unfortunately had to stop because she felt so miserable. She is slowly starting to feel better. She continues to use her Advair everyday and doesn't have any negative side effects from it. She saw Dr. Maple Hudson who states that there is no clear evidence of a mold allergy.  Despite all this, she states that she is feeling better than she was a year ago.  08/21/2013 ROV > Hannah Kline says that her breathing is doing well since the last visit. She has been attending 829 exercise classes a week. She monitors her oxygen while exercising and it rarely goes below 95%. She will use oxygen when she feels particularly short of breath. In the last 3-4 months she hasn't increased mucus production. It will typically last throughout the day but sometimes if she doesn't cough up and up mucus she will have post tussive emesis after dinner. She does not feel that the active heating makes her cough worse. She does not have trouble swallowing or choking on food. She has actually gained a few pounds since the last visit. She has no appetite but despite this she still eats 3 meals a day and uses nutritional supplements. Sinus congestion has continued to be a problem. She continues to use a Neti pot which helps.  10/23/2013 ROV >> Yesterday Hannah Kline coughed up some blood.  She had a cold one week ago which led to a lot of coughing.  She started taking Cipro whe she had the symptoms (starting over a week ago).  She ran out Saturday morning.  About 4 days ago she started coughing up more mucus. Yesterday she coughed up a small blood clot, about a teaspoon amount.  She has had more dyspnea.  No fevers.  No chest pain.  She has had sinus congestion and cough since then.  She is coughing at night a lot.    Past Medical  History  Diagnosis Date  . Atrial fibrillation   . History of Mycobacterium avium complex infection     lung disease  . COPD (chronic obstructive pulmonary disease)    . Bronchiectasis   . Hepatitis     h/o INH  . Bacterial infection due to Pseudomonas   . Pleurisy 12/08/11     Review of Systems  Constitutional: Negative for fever, chills, fatigue and unexpected weight change.  HENT: Positive for congestion. Negative for ear pain and nosebleeds.   Respiratory: Positive for cough. Negative for choking and shortness of breath.   Cardiovascular: Negative for chest pain and leg swelling.       Objective:   Physical Exam  Filed  Vitals:   10/23/13 1530  BP: 134/74  Pulse: 66  Height: 5\' 8"  (1.727 m)  Weight: 130 lb (58.968 kg)  SpO2: 94%   room air  Gen: well appearing, no acute distress HEENT: NCAT, EOMi, OP clear,  PULM: Rhonchi bilateral upper lobes, wheezing bilaterally CV: Irreg irreg, no mgr, no JVD AB: BS+, soft, nontender, no hsm Ext: warm, no edema, no clubbing, no cyanosis  Review of 2012 sputum microbiology: Pan sensitive pseudomonas  09/2010 Full PFT: Ratio 63%, FEV1 1.49L (67% pred)  10/2011 CT Chest: impressive upper lobe cystic bronchiectasis, scattered lower lobe tree-in-bud abnormalities and scattered nodules; One RML solid nodule measures 7.70mm  January 2013 CT chest [facility]: Stable upper lobe cystic bronchiectasis also present in the right middle lobe. Scattered tree in bud abnormalities and nodules throughout all lung fields roughly unchanged from prior. Right middle lobe nodule is no longer visible but there is an 8mm nodule in the right upper lobe.  December 2013 simple spirometry FEV1 0.8 L  10/11/2012 Sputum: PSEUDOMONAS AERUGINOSA       Antibiotic  Sensitivity  Microscan  Status    CEFEPIME  Sensitive  2  Final    CEFTAZIDIME  Sensitive  <=1  Final    CIPROFLOXACIN  Intermediate  2  Final    GENTAMICIN  Sensitive  <=1  Final    IMIPENEM   Sensitive  1  Final    LEVOFLOXACIN  Intermediate  4  Final    PIP/TAZO  Sensitive  <=4  Final    TOBRAMYCIN  Sensitive  <=1  Final    January 2014 simple spirometry >> not reproducible by ATS standards  January 2014 Sputum AFB > neg x3     Assessment & Plan:   Bronchiectasis with acute exacerbation Hannah Kline is having a flare of her bronchiectasis which is causing some mild hemoptysis. It concerns me that she is feeling a bit more short of breath. Her oxygenation today is within normal limits for her. She is wheezing a bit more than baseline.  We are limited somewhat and what antibiotics to use because her last Pseudomonas culture from one year ago showed intermediate susceptibility to Cipro.  Plan: -She really needs to be treated with at least a 14 day course of antibiotics I'm going to add 10 more days of Cipro -Prednisone for 6 days -Continue inhaled therapy -Postural therapy advised -If she sees worsening hemoptysis or for shortness of breath gets worse she has been in need to be admitted for IV antibiotics (based on 12 2013 culture results, would use ceftaz edema with a PICC line with plan to discharge home for 14 day course once clinical improvement is reached.)    Updated Medication List Outpatient Encounter Prescriptions as of 10/23/2013  Medication Sig  . dabigatran (PRADAXA) 150 MG CAPS capsule Take 150 mg by mouth as needed.   . digoxin (LANOXIN) 0.25 MG tablet Take 1 tablet (0.25 mg total) by mouth daily as needed.  . dorzolamide-timolol (COSOPT) 22.3-6.8 MG/ML ophthalmic solution Place 1 drop into the left eye every 12 (twelve) hours.   . flecainide (TAMBOCOR) 50 MG tablet Take 1 tablet (50 mg total) by mouth 2 (two) times daily.  . Fluticasone-Salmeterol (ADVAIR) 250-50 MCG/DOSE AEPB Inhale 1 puff into the lungs as needed.  . latanoprost (XALATAN) 0.005 % ophthalmic solution Place 1 drop into the left eye At bedtime.  . levalbuterol (XOPENEX HFA) 45 MCG/ACT inhaler  Inhale 2 puffs into the lungs every 4 (four) hours  as needed.  Marland Kitchen levothyroxine (SYNTHROID, LEVOTHROID) 75 MCG tablet TAKE 1 TABLET BY MOUTH DAILY.  . mometasone (NASONEX) 50 MCG/ACT nasal spray Place 2 sprays into the nose daily as needed.  . Saline 0.9 % AERS As directed  . [DISCONTINUED] mirtazapine (REMERON) 15 MG tablet TAKE 1 TABLET BY MOUTH AT BEDTIME.

## 2013-10-24 DIAGNOSIS — J471 Bronchiectasis with (acute) exacerbation: Secondary | ICD-10-CM | POA: Insufficient documentation

## 2013-10-24 NOTE — Assessment & Plan Note (Signed)
Shontay is having a flare of her bronchiectasis which is causing some mild hemoptysis. It concerns me that she is feeling a bit more short of breath. Her oxygenation today is within normal limits for her. She is wheezing a bit more than baseline.  We are limited somewhat and what antibiotics to use because her last Pseudomonas culture from one year ago showed intermediate susceptibility to Cipro.  Plan: -She really needs to be treated with at least a 14 day course of antibiotics I'm going to add 10 more days of Cipro -Prednisone for 6 days -Continue inhaled therapy -Postural therapy advised -If she sees worsening hemoptysis or for shortness of breath gets worse she has been in need to be admitted for IV antibiotics (based on 12 2013 culture results, would use ceftaz edema with a PICC line with plan to discharge home for 14 day course once clinical improvement is reached.)

## 2013-11-20 ENCOUNTER — Ambulatory Visit (INDEPENDENT_AMBULATORY_CARE_PROVIDER_SITE_OTHER): Payer: Medicare Other | Admitting: Pulmonary Disease

## 2013-11-20 ENCOUNTER — Encounter: Payer: Self-pay | Admitting: Pulmonary Disease

## 2013-11-20 VITALS — BP 106/66 | HR 65 | Temp 97.7°F | Ht 68.0 in | Wt 123.0 lb

## 2013-11-20 DIAGNOSIS — R0602 Shortness of breath: Secondary | ICD-10-CM

## 2013-11-20 DIAGNOSIS — J479 Bronchiectasis, uncomplicated: Secondary | ICD-10-CM

## 2013-11-20 DIAGNOSIS — J449 Chronic obstructive pulmonary disease, unspecified: Secondary | ICD-10-CM

## 2013-11-20 DIAGNOSIS — R197 Diarrhea, unspecified: Secondary | ICD-10-CM | POA: Insufficient documentation

## 2013-11-20 DIAGNOSIS — R0902 Hypoxemia: Secondary | ICD-10-CM

## 2013-11-20 DIAGNOSIS — R634 Abnormal weight loss: Secondary | ICD-10-CM

## 2013-11-20 DIAGNOSIS — J4489 Other specified chronic obstructive pulmonary disease: Secondary | ICD-10-CM

## 2013-11-20 DIAGNOSIS — Z9981 Dependence on supplemental oxygen: Secondary | ICD-10-CM

## 2013-11-20 MED ORDER — DRONABINOL 5 MG PO CAPS: 5.0000 mg | ORAL_CAPSULE | Freq: Two times a day (BID) | ORAL | Status: DC

## 2013-11-20 MED ORDER — DRONABINOL 5 MG PO CAPS: 2.5000 mg | ORAL_CAPSULE | Freq: Two times a day (BID) | ORAL | Status: DC

## 2013-11-20 NOTE — Assessment & Plan Note (Addendum)
Antibiotic related  Plan: -check C.diff -immodium if C.diff negative

## 2013-11-20 NOTE — Assessment & Plan Note (Signed)
Due to bronchiectasis flare.  Plan: -marinol -high calorie foods (high fat/protein) encouraged

## 2013-11-20 NOTE — Patient Instructions (Addendum)
Use your oxygen more with exertion Start taking the Advair again, if you feel better with it then I will call in an Rx If it doesn't help then I will call in two new nebulized medicines Use the marinol to help stimulate your appetite Eat high calorie foods as much as possible Go to [facility] this week for a chest x-ray  Use the marinol capsule to help stimulate your appetite  We will call you with the results of your tests  We will see you back in 6-8 weeks or sooner if needed

## 2013-11-20 NOTE — Assessment & Plan Note (Addendum)
Hannah Kline appears to have recovered from the infectious phase of her bronchiectasis flare, but she is still suffering with dyspnea and weight loss.  Because she is not making much mucus I don't see a great reason to ramp up more aggressive antibiotic therapy.  Plan: -at this point, push nutrition, restart Advair -if worsening mucus production or fever/chills, then admit for broad spectrum Abx -check CXR -repeat spirometry today and next visit

## 2013-11-20 NOTE — Progress Notes (Signed)
Subjective:    Patient ID: Hannah Kline, female    DOB: 11/20/1931, 80 y.o.   MRN: [id number] Synopsis: [name] established care with the [facility] pulmonary office in February 2013 for bronchiectasis due to [place]. She was diagnosed at age 79 in [place]. She was treated with rifampin ethambutol and clarithromycin for 2 years. Since then she does not believe she was ever grown out MAI.   HPI 7/8 routine office visit--Mrs. Mascio says that she's doing quite well and is now exercising 5 days a week. She is using pulse oximetry and states that usually during her exercise routine her oxygen stays above 92% on room air. However sometimes with her weight routine it drops below 88%. She takes albuterol before going to class and this helps quite a bit. She does not think that the Cipro has helped and she would like to stop. She continues to cough daily often getting up one half cup of sputum per day. It is typically pink but lately it has been yellow. She denies fevers chills. She is frustrated that her weight will not go up from 130 pounds.  08/07/12 Sick visit -- Ms. Reitman comes to clinic today because last week she developed hemoptysis while visiting [place]. She states that she took a train from [place] to [place] on Monday and and felt some degree of shortness of breath while walking around in town. On Tuesday evening after dinner she felt the need to clear her throat and then noticed that she had a mouthful of bright red blood. She had cough productive of bright red blood for several days thereafter and then this eventually turned into a peak colored sputum. At the time she had a chill but no chest pain. She denies leg pain or swelling. She states that today her sputum is clear. Otherwise she has not had an increase in sputum production. She thinks that the Nasonex has helped with her cough.  10/10/2012 ROV -- Roene has had a rough time. She did not have  improvement with the Cipro we gave her earlier in November. She has not had further episodes of coughing up blood but she has had increased sputum production. This is been associated with weight loss, fatigue, and chills. She has noticed mold in her home recently and a recent hypersensitivity pneumonitis panel showed some reaction to aspergillus species. She went to [place] for a week to visit her daughter around Thanksgiving and she said she felt much better while up there. She had less cough and shortness of breath.  10/17/2012 ROV -- Carletha is feeling a little better in that she has more energy.  She is still coughing, but not making as much sputum.  She is still somewhat short of breath, but again improved.  No fever, no chills.  No hemoptysis, no chest pain.  She is still taking the cipro.  She has not gotten the tobi yet.  11/14/12 ROV -- After finisihing the cipro Joany said that she started to feel better.  She feels that she has a lot of sinus disease and is getting a lot of plugs from sinuses.  They are typically yellow and sometimes cough worse when lying down. She usually gets up nasty stuff in after noon from her lungs but this is no worse than baseline.  Her chest congestion is worse every other day.  She denies allergic symptoms like itchy eyes, scratchy throat.    Her house has been treated  twice now for mold, as recent as 10 days ago.    She says that her breathing is good, but she sometimes gets out of breath with exertion.  She has gone back to exercising regularly.  04/10/2013 ROV > Morrigan says that the last 3 months have been OK. She has noticed increasing fatigue, and some shortness of breath in the last several weeks. She has not had increased cough but she does continue to have daily sputum production. She has occasional sinus mucus which is thick and green and clears well with Milta Deiters med rinses. She does not use Nasonex regularly. She took the inhaled TOBI but said that it made her  feel really sick. Specifically she had throat and tongue burning, a dry cough, nausea and vomiting, and increasing shortness of breath. She noted that her oxygen saturation level dropped to 82%. She took the drug for 11 days but unfortunately had to stop because she felt so miserable. She is slowly starting to feel better. She continues to use her Advair everyday and doesn't have any negative side effects from it. She saw Dr. Annamaria Boots who states that there is no clear evidence of a mold allergy.  Despite all this, she states that she is feeling better than she was a year ago.  08/21/2013 ROV > Pearline Cables says that her breathing is doing well since the last visit. She has been attending 829 exercise classes a week. She monitors her oxygen while exercising and it rarely goes below 95%. She will use oxygen when she feels particularly short of breath. In the last 3-4 months she hasn't increased mucus production. It will typically last throughout the day but sometimes if she doesn't cough up and up mucus she will have post tussive emesis after dinner. She does not feel that the active heating makes her cough worse. She does not have trouble swallowing or choking on food. She has actually gained a few pounds since the last visit. She has no appetite but despite this she still eats 3 meals a day and uses nutritional supplements. Sinus congestion has continued to be a problem. She continues to use a Neti pot which helps.  10/23/2013 ROV >> Yesterday Addelyn coughed up some blood.  She had a cold one week ago which led to a lot of coughing.  She started taking Cipro whe she had the symptoms (starting over a week ago).  She ran out Saturday morning.  About 4 days ago she started coughing up more mucus. Yesterday she coughed up a small blood clot, about a teaspoon amount.  She has had more dyspnea.  No fevers.  No chest pain.  She has had sinus congestion and cough since then.  She is coughing at night a lot.    11/20/2013 ROV >>   The hemoptysis cleared up pretty quickly, however her shortness of breath has continued to decline.  The cough medicine we gave her really knocked her out and she slept for about 12 hours.  Her weight is down by about four pounds.  She remains short of breath even minimal activity.  She is using a lot of oxygen lately.  She continues to have a lot of diarrhea since the antibiotic. She has not used Advair for a few months due to insurance issues.  She has not had an appetite. She has frequent watery diarrhea without too much abdominal pain.     Past Medical History  Diagnosis Date  . Atrial fibrillation   . History of  Mycobacterium avium complex infection     lung disease  . COPD (chronic obstructive pulmonary disease)    . Bronchiectasis   . Hepatitis     h/o INH  . Bacterial infection due to Pseudomonas   . Pleurisy 12/08/11     Review of Systems  Constitutional: Positive for appetite change, fatigue and unexpected weight change. Negative for fever and chills.  HENT: Negative for congestion, ear pain and nosebleeds.   Respiratory: Positive for cough and shortness of breath. Negative for choking.   Cardiovascular: Negative for chest pain and leg swelling.       Objective:   Physical Exam Filed Vitals:   11/20/13 1519  BP: 106/66  Pulse: 65  Temp: 97.7 F (36.5 C)  TempSrc: Oral  Height: 5\' 8"  (1.727 m)  Weight: 123 lb (55.792 kg)  SpO2: 95%   room air  11/2013 Ambulated 500 feet on 2L [place] and O2 saturation stayed > 95%  Gen: well appearing, no acute distress HEENT: NCAT, EOMi, OP clear,  PULM: very few exp wheezes, good air movement CV: Irreg irreg, no mgr, no JVD AB: BS+, soft, nontender, no hsm Ext: warm, no edema, no clubbing, no cyanosis  Review of 2012 sputum microbiology: Pan sensitive pseudomonas  09/2010 Full PFT: Ratio 63%, FEV1 1.49L (67% pred)  10/2011 CT Chest: impressive upper lobe cystic bronchiectasis, scattered lower lobe tree-in-bud abnormalities and  scattered nodules; One RML solid nodule measures 7.7mm  January 2013 CT chest [facility]: Stable upper lobe cystic bronchiectasis also present in the right middle lobe. Scattered tree in bud abnormalities and nodules throughout all lung fields roughly unchanged from prior. Right middle lobe nodule is no longer visible but there is an 54mm nodule in the right upper lobe.  December 2013 simple spirometry FEV1 0.8 L  10/11/2012 Sputum: PSEUDOMONAS AERUGINOSA       Antibiotic  Sensitivity  Microscan  Status    CEFEPIME  Sensitive  2  Final    CEFTAZIDIME  Sensitive  <=1  Final    CIPROFLOXACIN  Intermediate  2  Final    GENTAMICIN  Sensitive  <=1  Final    IMIPENEM  Sensitive  1  Final    LEVOFLOXACIN  Intermediate  4  Final    PIP/TAZO  Sensitive  <=4  Final    TOBRAMYCIN  Sensitive  <=1  Final    January 2014 simple spirometry >> not reproducible by ATS standards  January 2014 Sputum AFB > neg x3 11/2013 FEV1 0.93 L     Assessment & Plan:   C O P D Her FEV1 has dropped a little, due to her recent bronchiectasis flare.  Plan: -restart Advair -if no improvement with Advair, then try nebulized therapy with Budesonide and Pulmicort -encouraged 2L with exertion  BRONCHIECTASIS Londan appears to have recovered from the infectious phase of her bronchiectasis flare, but she is still suffering with dyspnea and weight loss.  Because she is not making much mucus I don't see a great reason to ramp up more aggressive antibiotic therapy.  Plan: -at this point, push nutrition, restart Advair -if worsening mucus production or fever/chills, then admit for broad spectrum Abx -check CXR -repeat spirometry today and next visit  Hypoxemia requiring supplemental oxygen Encouraged regular use of 2L O2 with exertion  Diarrhea Antibiotic related  Plan: -check C.diff -immodium if C.diff negative  Weight loss Due to bronchiectasis flare.  Plan: -marinol -high calorie foods (high fat/protein)  encouraged    Updated Medication  List Outpatient Encounter Prescriptions as of 11/20/2013  Medication Sig  . dabigatran (PRADAXA) 150 MG CAPS capsule Take 150 mg by mouth as needed.   . digoxin (LANOXIN) 0.25 MG tablet Take 1 tablet (0.25 mg total) by mouth daily as needed.  . dorzolamide-timolol (COSOPT) 22.3-6.8 MG/ML ophthalmic solution Place 1 drop into the left eye every 12 (twelve) hours.   . flecainide (TAMBOCOR) 50 MG tablet Take 1 tablet (50 mg total) by mouth 2 (two) times daily.  Marland Kitchen latanoprost (XALATAN) 0.005 % ophthalmic solution Place 1 drop into the left eye At bedtime.  . levalbuterol (XOPENEX HFA) 45 MCG/ACT inhaler Inhale 2 puffs into the lungs every 4 (four) hours as needed.  Marland Kitchen levothyroxine (SYNTHROID, LEVOTHROID) 75 MCG tablet TAKE 1 TABLET BY MOUTH DAILY.  . mometasone (NASONEX) 50 MCG/ACT nasal spray Place 2 sprays into the nose daily as needed.  . Fluticasone-Salmeterol (ADVAIR) 250-50 MCG/DOSE AEPB Inhale 1 puff into the lungs as needed.  . [DISCONTINUED] predniSONE (DELTASONE) 10 MG tablet Take 30mg  daily for 3 days, then take 20mg  daily for 3 days  . [DISCONTINUED] Saline 0.9 % AERS As directed

## 2013-11-20 NOTE — Assessment & Plan Note (Signed)
Her FEV1 has dropped a little, due to her recent bronchiectasis flare.  Plan: -restart Advair -if no improvement with Advair, then try nebulized therapy with Budesonide and Pulmicort -encouraged 2L with exertion

## 2013-11-20 NOTE — Assessment & Plan Note (Signed)
Encouraged regular use of 2L O2 with exertion

## 2013-11-21 ENCOUNTER — Ambulatory Visit (INDEPENDENT_AMBULATORY_CARE_PROVIDER_SITE_OTHER)
Admission: RE | Admit: 2013-11-21 | Discharge: 2013-11-21 | Disposition: A | Discharge status: Home / Self Care | Payer: Medicare Other | Source: Ambulatory Visit | Attending: Pulmonary Disease | Admitting: Pulmonary Disease

## 2013-11-21 DIAGNOSIS — R0602 Shortness of breath: Secondary | ICD-10-CM

## 2013-11-21 DIAGNOSIS — R05 Cough: Secondary | ICD-10-CM | POA: Diagnosis not present

## 2013-11-21 DIAGNOSIS — R059 Cough, unspecified: Secondary | ICD-10-CM | POA: Diagnosis not present

## 2013-11-22 ENCOUNTER — Telehealth: Payer: Self-pay

## 2013-11-22 ENCOUNTER — Other Ambulatory Visit: Payer: Medicare Other

## 2013-11-22 DIAGNOSIS — H4010X0 Unspecified open-angle glaucoma, stage unspecified: Secondary | ICD-10-CM | POA: Diagnosis not present

## 2013-11-22 DIAGNOSIS — R197 Diarrhea, unspecified: Secondary | ICD-10-CM | POA: Diagnosis not present

## 2013-11-22 NOTE — Telephone Encounter (Signed)
Message copied by Len Blalock on Thu Nov 22, 2013 11:22 AM ------      Message from: Simonne Maffucci B      Created: Wed Nov 21, 2013  5:27 PM       A,            Please let her know that her CXR showed effects of her recent bronchiectasis flare.  We will probably get another CXR when she comes back on the next visit.            Thanks      B ------

## 2013-11-22 NOTE — Telephone Encounter (Signed)
ATC X2, line busy.  Will CB

## 2013-11-22 NOTE — Progress Notes (Signed)
LMTCB X1, let pt know of cxr results and PA denial for marinol status.  See telephone note.

## 2013-11-22 NOTE — Telephone Encounter (Signed)
Pt is aware of cxr results and further recs, nothing further needed on this.

## 2013-11-22 NOTE — Telephone Encounter (Signed)
Spoke with pt, explained to her the cxr results and that her medication required a PA and has been denied at this time.  I advised her that BQ is in clinic this afternoon and once I get a response from him as to how he would like for me to proceed at this point, I will keep her updated.  She understands.

## 2013-11-22 NOTE — Telephone Encounter (Signed)
Pt is returning Ashley's call.  Satira Anis

## 2013-11-22 NOTE — Telephone Encounter (Signed)
[name], I'm trying to give Berna an appetite stimulant.  Insurance didn't want marinol,  Do you have any ideas?  Thanks B

## 2013-11-22 NOTE — Telephone Encounter (Signed)
Dr Lake Bells please advise.  Thank you.   Dr. Derrel Nip cc'ed per Dr Anastasia Pall request.

## 2013-11-22 NOTE — Telephone Encounter (Signed)
Returning call can be reached at [phone].Elnita Maxwell

## 2013-11-22 NOTE — Telephone Encounter (Signed)
Pt's Marinol required a P.A. Per [facility].  I called [phone] spoke to Lyons at [facility] and she stated that since this patient has Aurea Graff and is not HIV positive, the diagnosis codes used at her last visit do not qualify to approve this medication.  I have an appeal number [phone] option 1 that I can try to call to appeal the denial.  Do you want me to proceed with the appeal process, or do you have another alternate we can try?   Thanks, Caryl Pina

## 2013-11-23 ENCOUNTER — Telehealth: Payer: Self-pay | Admitting: Pulmonary Disease

## 2013-11-23 ENCOUNTER — Telehealth: Payer: Self-pay | Admitting: *Deleted

## 2013-11-23 DIAGNOSIS — Z8619 Personal history of other infectious and parasitic diseases: Secondary | ICD-10-CM | POA: Insufficient documentation

## 2013-11-23 LAB — CLOSTRIDIUM DIFFICILE EIA: CDIFTX: POSITIVE

## 2013-11-23 MED ORDER — METRONIDAZOLE 500 MG PO TABS: 500.0000 mg | ORAL_TABLET | Freq: Four times a day (QID) | ORAL | Status: DC

## 2013-11-23 MED ORDER — MIRTAZAPINE 7.5 MG PO TABS: 7.5000 mg | ORAL_TABLET | Freq: Every day | ORAL | Status: DC

## 2013-11-23 NOTE — Telephone Encounter (Signed)
Pt called back to let us know she had tried Remeron in the past and did not help. i advised her she needed to contact her PCP regarding this. She will do so. Nothing further needed

## 2013-11-23 NOTE — Progress Notes (Signed)
Hannah Kline, thanks for sending flagyl. agree

## 2013-11-23 NOTE — Addendum Note (Signed)
Addended by: Crecencio Mc on: 11/23/2013 09:28 AM   Modules accepted: Orders

## 2013-11-23 NOTE — Telephone Encounter (Signed)
[facility] called pt tested POSITIVE for C-diff

## 2013-11-23 NOTE — Telephone Encounter (Signed)
Let her know that we can start mirtazipime 7.5 mg po qHS after she completes 14 days of flagyl for c.diff

## 2013-11-23 NOTE — Telephone Encounter (Signed)
Per Dr. Derrel Nip and Dr. Lake Bells, Remeron 7.5mg  1po qhs was called in for pt, to begin taking AFTER she completes her 14 day rx of flagyl.  Pt understands.  Nothing further needed at this time.

## 2013-11-23 NOTE — Telephone Encounter (Signed)
Either megace,  Or mirtazipine (remeron), an antidepressant that is very well tolerated in the elderly,  Improves appetite,  7.5 mg to start,  At bedtime.

## 2013-11-26 ENCOUNTER — Telehealth: Payer: Self-pay | Admitting: Internal Medicine

## 2013-11-26 NOTE — Telephone Encounter (Signed)
Patient reports no diarrhea and wants to know if she has to remain on liquid diet for C-Diff. Advised patient last Friday yes> Please advise.

## 2013-11-26 NOTE — Telephone Encounter (Signed)
Please confirm 2 things:  She was having diarrhea when the stool sample was obtained 2) she is not constipated

## 2013-11-26 NOTE — Telephone Encounter (Signed)
Pt returned your call from Friday Pt wanted to check with you about her liquid diet.  Does she need to stay on this all week?

## 2013-11-27 NOTE — Telephone Encounter (Signed)
Patient stated she is not having diarrhea or constipation, but is having nausea and does not know whether the flagyl or the C-diff, patient is on liquid diet and very concerned about her weight lost and would like to know if she should try some solid food at this point.  Patient also has had question about the mirtazapine also and discussed this with patient per lab note. Patient is agreeable to continue the mirtazapine to help with aapetitie.

## 2013-11-27 NOTE — Telephone Encounter (Signed)
Can resume regular diet and mirtazipine starting at 7.5 mg daily at bedtime.  Needs to make appt tot see me in 2 weeks

## 2013-11-29 NOTE — Telephone Encounter (Signed)
Patient notified and and appointment scheduled for 12/12/13 patient has started mirtazapine as instructed.

## 2013-12-05 ENCOUNTER — Telehealth: Payer: Self-pay

## 2013-12-05 MED ORDER — BUDESONIDE-FORMOTEROL FUMARATE 80-4.5 MCG/ACT IN AERO: 2.0000 | INHALATION_SPRAY | Freq: Two times a day (BID) | RESPIRATORY_TRACT | Status: DC

## 2013-12-05 NOTE — Telephone Encounter (Signed)
Called pt to verify that she is taking the Remeron that Dr. Derrel Nip suggested as a substitute for the Marinol that was denied approval.  Pt is taking this, says she is tolerating well and has increased her eating.  I will discard the PA request for the Marinol.  Also refilled symbicort per pt request.  Nothing further needed at this time.

## 2013-12-11 ENCOUNTER — Telehealth: Payer: Self-pay | Admitting: Pulmonary Disease

## 2013-12-11 MED ORDER — FLUTICASONE-SALMETEROL 250-50 MCG/DOSE IN AEPB: 1.0000 | INHALATION_SPRAY | Freq: Two times a day (BID) | RESPIRATORY_TRACT | Status: DC

## 2013-12-11 NOTE — Telephone Encounter (Signed)
Pt needed a refill on Advair. BQ had given her samples at her last OV. This has been sent in. Nothing further was needed.

## 2013-12-12 ENCOUNTER — Ambulatory Visit: Payer: Medicare Other | Admitting: Internal Medicine

## 2013-12-12 ENCOUNTER — Other Ambulatory Visit: Payer: Self-pay

## 2013-12-12 MED ORDER — MOMETASONE FUROATE 50 MCG/ACT NA SUSP: 2.0000 | Freq: Every day | NASAL | Status: DC | PRN

## 2013-12-13 ENCOUNTER — Encounter: Payer: Self-pay | Admitting: Adult Health

## 2013-12-13 ENCOUNTER — Ambulatory Visit (INDEPENDENT_AMBULATORY_CARE_PROVIDER_SITE_OTHER): Payer: Medicare Other | Admitting: Adult Health

## 2013-12-13 VITALS — BP 100/58 | HR 73 | Temp 98.1°F | Resp 12 | Wt 125.0 lb

## 2013-12-13 DIAGNOSIS — A0472 Enterocolitis due to Clostridium difficile, not specified as recurrent: Secondary | ICD-10-CM | POA: Diagnosis not present

## 2013-12-13 MED ORDER — METRONIDAZOLE 500 MG PO TABS: 500.0000 mg | ORAL_TABLET | Freq: Three times a day (TID) | ORAL | Status: DC

## 2013-12-13 NOTE — Progress Notes (Signed)
Pre visit review using our clinic review tool, if applicable. No additional management support is needed unless otherwise documented below in the visit note. 

## 2013-12-13 NOTE — Patient Instructions (Signed)
  Please return the stool samples as soon as you have collected them.  Start the Flagyl 500 mg every 8 hours for 10 days.  You may eat food with medication. No alcohol.  Return for follow up to see Dr. Derrel Nip after completing antibiotic.

## 2013-12-13 NOTE — Progress Notes (Signed)
Patient ID: Hannah Kline, female   DOB: [birth year], 80 y.o.   MRN: [id number]    Subjective:    Patient ID: Hannah Kline, female    DOB: [birth year], 80 y.o.   MRN: [id number]  HPI  Pt is a pleasant 80 y/o female recently finished treatment for C. Diff with Flagly and reports ongoing diarrhea. The diarrhea has decreased. She is now only having approximately 2 diarrhea stools daily. She is tolerating food and drink. Is concerned that her symptoms have not improved. Denies fever, chills, n/v.    Past Medical History  Diagnosis Date  . Atrial fibrillation   . History of Mycobacterium avium complex infection     lung disease  . COPD (chronic obstructive pulmonary disease)    . Bronchiectasis   . Hepatitis     h/o INH  . Bacterial infection due to Pseudomonas   . Pleurisy 12/08/11    Current Outpatient Prescriptions on File Prior to Visit  Medication Sig Dispense Refill  . dabigatran (PRADAXA) 150 MG CAPS capsule Take 150 mg by mouth as needed.       . digoxin (LANOXIN) 0.25 MG tablet Take 1 tablet (0.25 mg total) by mouth daily as needed.  30 tablet  3  . dorzolamide-timolol (COSOPT) 22.3-6.8 MG/ML ophthalmic solution Place 1 drop into the left eye every 12 (twelve) hours.       . Fluticasone-Salmeterol (ADVAIR) 250-50 MCG/DOSE AEPB Inhale 1 puff into the lungs 2 (two) times daily.  60 each  5  . latanoprost (XALATAN) 0.005 % ophthalmic solution Place 1 drop into the left eye At bedtime.      . levalbuterol (XOPENEX HFA) 45 MCG/ACT inhaler Inhale 2 puffs into the lungs every 4 (four) hours as needed.  3 Inhaler  3  . levothyroxine (SYNTHROID, LEVOTHROID) 75 MCG tablet TAKE 1 TABLET BY MOUTH DAILY.  90 tablet  1  . mirtazapine (REMERON) 7.5 MG tablet Take 1 tablet (7.5 mg total) by mouth at bedtime.  30 tablet  2  . mometasone (NASONEX) 50 MCG/ACT nasal spray Place 2 sprays into the nose daily as needed.  17 g  5   No current facility-administered medications on file prior to visit.      Review of Systems  Constitutional: Negative for fever, chills and fatigue.  Respiratory: Negative.   Cardiovascular: Negative.   Gastrointestinal: Positive for diarrhea. Negative for nausea, vomiting, abdominal pain, constipation, blood in stool and abdominal distention.  All other systems reviewed and are negative.       Objective:  BP 100/58  Pulse 73  Temp(Src) 98.1 F (36.7 C) (Oral)  Resp 12  Wt 125 lb (56.7 kg)  SpO2 94%   Physical Exam  Constitutional: She is oriented to person, place, and time. She appears well-developed and well-nourished. No distress.  Cardiovascular: Normal rate and regular rhythm.   Pulmonary/Chest: Effort normal. No respiratory distress.  Abdominal: Soft. She exhibits no distension. There is no tenderness. There is no rebound and no guarding.  Hyperactive bowel sounds throughout all quadrants  Musculoskeletal: Normal range of motion.  Neurological: She is alert and oriented to person, place, and time.  Skin: Skin is warm and dry.  Psychiatric: She has a normal mood and affect. Her behavior is normal. Judgment and thought content normal.       Assessment & Plan:    1. Colitis, Clostridium difficile Not resolved. Per guidelines will start second course of flagyl 500 mg every 8 hours x 10 days. Recheck  stool samples. May eat with flagyl as tolerated. Continue to drink fluids to maintain hydration. No Etoh while on Flagyl. If fails this tx may need round of oral Vancomycin. Her symptoms were improving so I am hopeful that this 2nd round will help.

## 2013-12-14 ENCOUNTER — Other Ambulatory Visit: Payer: Self-pay

## 2013-12-17 ENCOUNTER — Other Ambulatory Visit (INDEPENDENT_AMBULATORY_CARE_PROVIDER_SITE_OTHER): Payer: Medicare Other

## 2013-12-17 DIAGNOSIS — R197 Diarrhea, unspecified: Secondary | ICD-10-CM | POA: Diagnosis not present

## 2013-12-18 ENCOUNTER — Encounter: Payer: Self-pay | Admitting: Internal Medicine

## 2013-12-18 ENCOUNTER — Encounter: Payer: Self-pay | Admitting: Adult Health

## 2013-12-18 LAB — CLOSTRIDIUM DIFFICILE EIA: CDIFTX: NEGATIVE

## 2013-12-26 ENCOUNTER — Encounter: Payer: Medicare Other | Admitting: Internal Medicine

## 2014-01-04 ENCOUNTER — Ambulatory Visit (INDEPENDENT_AMBULATORY_CARE_PROVIDER_SITE_OTHER): Payer: Medicare Other | Admitting: Pulmonary Disease

## 2014-01-04 ENCOUNTER — Encounter: Payer: Self-pay | Admitting: Pulmonary Disease

## 2014-01-04 VITALS — BP 108/66 | HR 68 | Ht 68.0 in | Wt 126.8 lb

## 2014-01-04 DIAGNOSIS — J449 Chronic obstructive pulmonary disease, unspecified: Secondary | ICD-10-CM | POA: Diagnosis not present

## 2014-01-04 DIAGNOSIS — J479 Bronchiectasis, uncomplicated: Secondary | ICD-10-CM | POA: Diagnosis not present

## 2014-01-04 NOTE — Patient Instructions (Signed)
Keep taking your medicines as you are doing Please perform some sort of pulmonary clearance daily (exercise, yoga, postural drainage, flutter valve all count) Bring [place] a sputum culture We will see you back in 3 months or sooner if needed

## 2014-01-04 NOTE — Progress Notes (Signed)
Subjective:    Patient ID: Hannah Kline, female    DOB: July 31, 1932, 80 y.o.   MRN: [id number] Synopsis: Hannah Kline established care with the [facility] pulmonary office in February 2013 for bronchiectasis due to [place]. She was diagnosed at age 68 in [place]. She was treated with rifampin ethambutol and clarithromycin for 2 years. Since then she does not believe she was ever grown out MAI.   HPI  7/8 routine office visit--Hannah Kline says that she's doing quite well and is now exercising 5 days a week. She is using pulse oximetry and states that usually during her exercise routine her oxygen stays above 92% on room air. However sometimes with her weight routine it drops below 88%. She takes albuterol before going to class and this helps quite a bit. She does not think that the Cipro has helped and she would like to stop. She continues to cough daily often getting up one half cup of sputum per day. It is typically pink but lately it has been yellow. She denies fevers chills. She is frustrated that her weight will not go up from 130 pounds.  08/07/12 Sick visit -- Hannah Kline comes to clinic today because last week she developed hemoptysis while visiting [place]. She states that she took a train from [place] to [place] on Monday and and felt some degree of shortness of breath while walking around in town. On Tuesday evening after dinner she felt the need to clear her throat and then noticed that she had a mouthful of bright red blood. She had cough productive of bright red blood for several days thereafter and then this eventually turned into a peak colored sputum. At the time she had a chill but no chest pain. She denies leg pain or swelling. She states that today her sputum is clear. Otherwise she has not had an increase in sputum production. She thinks that the Nasonex has helped with her cough.  10/10/2012 ROV -- Hannah Kline has had a rough time. She did not have  improvement with the Cipro we gave her earlier in November. She has not had further episodes of coughing up blood but she has had increased sputum production. This is been associated with weight loss, fatigue, and chills. She has noticed mold in her home recently and a recent hypersensitivity pneumonitis panel showed some reaction to aspergillus species. She went to [place] for a week to visit her daughter around Thanksgiving and she said she felt much better while up there. She had less cough and shortness of breath.  10/17/2012 ROV -- Hannah Kline is feeling a little better in that she has more energy.  She is still coughing, but not making as much sputum.  She is still somewhat short of breath, but again improved.  No fever, no chills.  No hemoptysis, no chest pain.  She is still taking the cipro.  She has not gotten the tobi yet.  11/14/12 ROV -- After finisihing the cipro Hannah Kline said that she started to feel better.  She feels that she has a lot of sinus disease and is getting a lot of plugs from sinuses.  They are typically yellow and sometimes cough worse when lying down. She usually gets up nasty stuff in after noon from her lungs but this is no worse than baseline.  Her chest congestion is worse every other day.  She denies allergic symptoms like itchy eyes, scratchy throat.    Her house has been  treated twice now for mold, as recent as 10 days ago.    She says that her breathing is good, but she sometimes gets out of breath with exertion.  She has gone back to exercising regularly.  04/10/2013 ROV > Hannah Kline says that the last 3 months have been OK. She has noticed increasing fatigue, and some shortness of breath in the last several weeks. She has not had increased cough but she does continue to have daily sputum production. She has occasional sinus mucus which is thick and green and clears well with Milta Deiters med rinses. She does not use Nasonex regularly. She took the inhaled TOBI but said that it made her  feel really sick. Specifically she had throat and tongue burning, a dry cough, nausea and vomiting, and increasing shortness of breath. She noted that her oxygen saturation level dropped to 82%. She took the drug for 11 days but unfortunately had to stop because she felt so miserable. She is slowly starting to feel better. She continues to use her Advair everyday and doesn't have any negative side effects from it. She saw Dr. Annamaria Boots who states that there is no clear evidence of a mold allergy.  Despite all this, she states that she is feeling better than she was a year ago.  08/21/2013 ROV > Hannah Kline says that her breathing is doing well since the last visit. She has been attending 829 exercise classes a week. She monitors her oxygen while exercising and it rarely goes below 95%. She will use oxygen when she feels particularly short of breath. In the last 3-4 months she hasn't increased mucus production. It will typically last throughout the day but sometimes if she doesn't cough up and up mucus she will have post tussive emesis after dinner. She does not feel that the active heating makes her cough worse. She does not have trouble swallowing or choking on food. She has actually gained a few pounds since the last visit. She has no appetite but despite this she still eats 3 meals a day and uses nutritional supplements. Sinus congestion has continued to be a problem. She continues to use a Neti pot which helps.  10/23/2013 ROV >> Yesterday Hannah Kline coughed up some blood.  She had a cold one week ago which led to a lot of coughing.  She started taking Cipro whe she had the symptoms (starting over a week ago).  She ran out Saturday morning.  About 4 days ago she started coughing up more mucus. Yesterday she coughed up a small blood clot, about a teaspoon amount.  She has had more dyspnea.  No fevers.  No chest pain.  She has had sinus congestion and cough since then.  She is coughing at night a lot.    11/20/2013 ROV >>   The hemoptysis cleared up pretty quickly, however her shortness of breath has continued to decline.  The cough medicine we gave her really knocked her out and she slept for about 12 hours.  Her weight is down by about four pounds.  She remains short of breath even minimal activity.  She is using a lot of oxygen lately.  She continues to have a lot of diarrhea since the antibiotic. She has not used Advair for a few months due to insurance issues.  She has not had an appetite. She has frequent watery diarrhea without too much abdominal pain.     01/04/2014 ROV >> Hannah Kline is doing well since recovering from the c.diff.  She  doesn't think that her weight has changed much.  Overall she feels really well.  She is eating well and cooking a large variety of food and has a good appetite.  She is eating between meals and feels.  She is back to using yoga and no longer needs oxygen with her aerobics.  She is using the Advair twice a day and feels that this really helps.  Lately she still coughs up some sputum daily.    Past Medical History  Diagnosis Date  . Atrial fibrillation   . History of Mycobacterium avium complex infection     lung disease  . COPD (chronic obstructive pulmonary disease)    . Bronchiectasis   . Hepatitis     h/o INH  . Bacterial infection due to Pseudomonas   . Pleurisy 12/08/11     Review of Systems  Constitutional: Negative for fever, chills, appetite change, fatigue and unexpected weight change.  HENT: Negative for congestion, ear pain and nosebleeds.   Respiratory: Positive for shortness of breath. Negative for cough and choking.   Cardiovascular: Negative for chest pain and leg swelling.       Objective:   Physical Exam  Filed Vitals:   01/04/14 1128  BP: 108/66  Pulse: 68  Height: 5\' 8"  (1.727 m)  Weight: 126 lb 12.8 oz (57.516 kg)  SpO2: 98%   room air   Gen: well appearing, no acute distress HEENT: NCAT, EOMi, OP clear,  PULM: Few inspiratory crackles in the  left greater than right, no wheezing CV: Irreg irreg, no mgr, no JVD AB: BS+, soft, nontender, no hsm Ext: warm, no edema, no clubbing, no cyanosis  Review of 2012 sputum microbiology: Pan sensitive pseudomonas  09/2010 Full PFT: Ratio 63%, FEV1 1.49L (67% pred)  10/2011 CT Chest: impressive upper lobe cystic bronchiectasis, scattered lower lobe tree-in-bud abnormalities and scattered nodules; One RML solid nodule measures 7.39mm  January 2013 CT chest [facility]: Stable upper lobe cystic bronchiectasis also present in the right middle lobe. Scattered tree in bud abnormalities and nodules throughout all lung fields roughly unchanged from prior. Right middle lobe nodule is no longer visible but there is an 64mm nodule in the right upper lobe.  December 2013 simple spirometry FEV1 0.8 L  10/11/2012 Sputum: PSEUDOMONAS AERUGINOSA       Antibiotic  Sensitivity  Microscan  Status    CEFEPIME  Sensitive  2  Final    CEFTAZIDIME  Sensitive  <=1  Final    CIPROFLOXACIN  Intermediate  2  Final    GENTAMICIN  Sensitive  <=1  Final    IMIPENEM  Sensitive  1  Final    LEVOFLOXACIN  Intermediate  4  Final    PIP/TAZO  Sensitive  <=4  Final    TOBRAMYCIN  Sensitive  <=1  Final    January 2014 simple spirometry >> not reproducible by ATS standards  January 2014 Sputum AFB > neg x3 11/2013 FEV1 0.93 L     Assessment & Plan:   BRONCHIECTASIS This has been a stable interval for [place]. Today we reviewed the importance of pulmonary clearance.  Plan: -Continue Advair -Surveillance sputum culture -Daily muco-ciliary clearance encouraged through exercise, flutter valve, or postural drainage or fiber percussion vest  C O P D Continue Advair    Updated Medication List Outpatient Encounter Prescriptions as of 01/04/2014  Medication Sig  . dabigatran (PRADAXA) 150 MG CAPS capsule Take 150 mg by mouth as needed.   . digoxin (LANOXIN)  0.25 MG tablet Take 1 tablet (0.25 mg total) by mouth daily as  needed.  . dorzolamide-timolol (COSOPT) 22.3-6.8 MG/ML ophthalmic solution Place 1 drop into the left eye every 12 (twelve) hours.   Marland Kitchen dronabinol (MARINOL) 5 MG capsule Take 2.5 mg by mouth 2 (two) times daily before lunch and supper.  . flecainide (TAMBOCOR) 50 MG tablet Take 50 mg by mouth 2 (two) times daily.  . Fluticasone-Salmeterol (ADVAIR) 250-50 MCG/DOSE AEPB Inhale 1 puff into the lungs 2 (two) times daily.  Marland Kitchen latanoprost (XALATAN) 0.005 % ophthalmic solution Place 1 drop into the left eye At bedtime.  . levalbuterol (XOPENEX HFA) 45 MCG/ACT inhaler Inhale 2 puffs into the lungs every 4 (four) hours as needed.  Marland Kitchen levothyroxine (SYNTHROID, LEVOTHROID) 75 MCG tablet TAKE 1 TABLET BY MOUTH DAILY.  . mirtazapine (REMERON) 7.5 MG tablet Take 1 tablet (7.5 mg total) by mouth at bedtime.  . mometasone (NASONEX) 50 MCG/ACT nasal spray Place 2 sprays into the nose daily as needed.  . [DISCONTINUED] metroNIDAZOLE (FLAGYL) 500 MG tablet Take 1 tablet (500 mg total) by mouth 3 (three) times daily.

## 2014-01-04 NOTE — Assessment & Plan Note (Signed)
This has been a stable interval for Hannah Kline. Today we reviewed the importance of pulmonary clearance.  Plan: -Continue Advair -Surveillance sputum culture -Daily muco-ciliary clearance encouraged through exercise, flutter valve, or postural drainage or fiber percussion vest

## 2014-01-04 NOTE — Assessment & Plan Note (Signed)
Continue Advair 

## 2014-01-07 ENCOUNTER — Telehealth: Payer: Self-pay | Admitting: *Deleted

## 2014-01-07 ENCOUNTER — Ambulatory Visit (INDEPENDENT_AMBULATORY_CARE_PROVIDER_SITE_OTHER): Payer: Medicare Other | Admitting: Internal Medicine

## 2014-01-07 ENCOUNTER — Encounter: Payer: Self-pay | Admitting: Internal Medicine

## 2014-01-07 VITALS — BP 132/70 | HR 66 | Temp 97.8°F | Resp 16 | Ht 68.0 in | Wt 123.5 lb

## 2014-01-07 DIAGNOSIS — Z13820 Encounter for screening for osteoporosis: Secondary | ICD-10-CM

## 2014-01-07 DIAGNOSIS — Z23 Encounter for immunization: Secondary | ICD-10-CM

## 2014-01-07 DIAGNOSIS — Z1211 Encounter for screening for malignant neoplasm of colon: Secondary | ICD-10-CM

## 2014-01-07 DIAGNOSIS — Z1239 Encounter for other screening for malignant neoplasm of breast: Secondary | ICD-10-CM

## 2014-01-07 DIAGNOSIS — Z9981 Dependence on supplemental oxygen: Secondary | ICD-10-CM

## 2014-01-07 DIAGNOSIS — Z0000 Encounter for general adult medical examination without abnormal findings: Secondary | ICD-10-CM

## 2014-01-07 DIAGNOSIS — A0472 Enterocolitis due to Clostridium difficile, not specified as recurrent: Secondary | ICD-10-CM

## 2014-01-07 DIAGNOSIS — R0902 Hypoxemia: Secondary | ICD-10-CM

## 2014-01-07 NOTE — Progress Notes (Signed)
Pre-visit discussion using our clinic review tool. No additional management support is needed unless otherwise documented below in the visit note.  

## 2014-01-07 NOTE — Telephone Encounter (Signed)
Patient called left voicemail last breast imaging was 10/13 Please advise?

## 2014-01-07 NOTE — Patient Instructions (Signed)
You had your annual Medicare wellness exam today  We will schedule your mammogram soon.  Please use the stool kit to send [place] back a sample to test for blood.  This is your colon CA screening test.   You received the pneumonia vaccine today.

## 2014-01-07 NOTE — Progress Notes (Signed)
Patient ID: Hannah Kline, female   DOB: 09/23/1932, 80 y.o.   MRN: [id number]  The patient is here for annual Medicare wellness examination and management of other chronic and acute problems.  She has not had a mammogram since she moved from [place].  Last mammo was  done at an unnamed facility in [place].   She is Up to date on eye exams because she has  glaucoma under treatment by Dr. Mordecai Kline.  The pressure improving as of 2 weeks ago.   Her recent episode of Clostridum dificile colitis was treated successfully as an outpatient with oral metronidazole  In December.  Appetite improved. Still taking a probiotic as advised.    The risk factors are reflected in the social history.  The roster of all physicians providing medical care to patient - is listed in the Snapshot section of the chart.  Activities of daily living:  The patient is 100% independent in all ADLs: dressing, toileting, feeding as well as independent mobility  Home safety : The patient has smoke detectors in the home. They wear seatbelts.  There are no firearms at home. There is no violence in the home.   There is no risks for hepatitis, STDs or HIV. There is no   history of blood transfusion. They have no travel history to infectious disease endemic areas of the world.  The patient has seen their dentist in the last six month. They have seen their eye doctor in the last year. They admit to slight hearing difficulty with regard to whispered voices and some television programs.  They have deferred audiologic testing in the last year.  They do not  have excessive sun exposure. Discussed the need for sun protection: hats, long sleeves and use of sunscreen if there is significant sun exposure.   Diet: the importance of a healthy diet is discussed. They do have a healthy diet.  The benefits of regular aerobic exercise were discussed. She participates in exercise classes 4 times per week and is no longer requiring the use of  supplemental oxygen during the classess  Depression screen: there are no signs or vegative symptoms of depression- irritability, change in appetite, anhedonia, sadness/tearfullness.  Cognitive assessment: the patient manages all their financial and personal affairs and is actively engaged. They could relate day,date,year and events; recalled 2/3 objects at 3 minutes; performed clock-face test normally.  The following portions of the patient's history were reviewed and updated as appropriate: allergies, current medications, past family history, past medical history,  past surgical history, past social history  and problem list.  Visual acuity was not assessed per patient preference since she has regular follow up with her ophthalmologist. Hearing and body mass index were assessed and reviewed.   During the course of the visit the patient was educated and counseled about appropriate screening and preventive services including : fall prevention , diabetes screening, nutrition counseling, colorectal cancer screening, and recommended immunizations.  Objective:    BP 132/70  Pulse 66  Temp(Src) 97.8 F (36.6 C) (Oral)  Resp 16  Ht 5\' 8"  (1.727 m)  Wt 123 lb 8 oz (56.019 kg)  BMI 18.78 kg/m2  SpO2 96%  General Appearance:    Alert, cooperative, no distress, appears stated age  Head:    Normocephalic, without obvious abnormality, atraumatic  Eyes:    PERRL, conjunctiva/corneas clear, EOM's intact, fundi    benign, both eyes  Ears:    Normal TM's and external ear canals, both ears  Nose:   Nares normal, septum midline, mucosa normal, no drainage    or sinus tenderness  Throat:   Lips, mucosa, and tongue normal; teeth and gums normal  Neck:   Supple, symmetrical, trachea midline, no adenopathy;    thyroid:  no enlargement/tenderness/nodules; no carotid   bruit or JVD  Back:     Symmetric, no curvature, ROM normal, no CVA tenderness  Lungs:     Clear to auscultation bilaterally, respirations  unlabored  Chest Wall:    No tenderness or deformity   Heart:    Regular rate and rhythm, S1 and S2 normal, no murmur, rub   or gallop  Breast Exam:    No tenderness, masses, or nipple abnormality  Abdomen:     Soft, non-tender, bowel sounds active all four quadrants,    no masses, no organomegaly  Extremities:   Extremities normal, atraumatic, no cyanosis or edema  Pulses:   2+ and symmetric all extremities  Skin:   Skin color, texture, turgor normal, no rashes or lesions  Lymph nodes:   Cervical, supraclavicular, and axillary nodes normal  Neurologic:   CNII-XII intact, normal strength, sensation and reflexes    throughout    Assessment and Plan:  Visit for preventive health examination Annual comprehensive exam was done including breast, excluding pelvic exam per patientpreference. All screenings have been addressed and ordered   Colitis, Clostridium difficile Resolved with oral metronidazole.  Lifelong probiotics advised.   Screening for colon cancer She had a normal colonoscopy in 2007 .  Given her age and chronic lung disease we will continue screening with annual FOBTS going forward  Hypoxemia requiring supplemental oxygen She has continued to improve her exercise tolerance and no longer feels symptomatic if she exercises without supplemental 02    Updated Medication List Outpatient Encounter Prescriptions as of 01/07/2014  Medication Sig  . dabigatran (PRADAXA) 150 MG CAPS capsule Take 150 mg by mouth as needed.   . digoxin (LANOXIN) 0.25 MG tablet Take 1 tablet (0.25 mg total) by mouth daily as needed.  . dorzolamide-timolol (COSOPT) 22.3-6.8 MG/ML ophthalmic solution Place 1 drop into the left eye every 12 (twelve) hours.   . flecainide (TAMBOCOR) 50 MG tablet Take 50 mg by mouth 2 (two) times daily.  . Fluticasone-Salmeterol (ADVAIR) 250-50 MCG/DOSE AEPB Inhale 1 puff into the lungs 2 (two) times daily.  Marland Kitchen latanoprost (XALATAN) 0.005 % ophthalmic solution Place 1 drop  into the left eye At bedtime.  . levalbuterol (XOPENEX HFA) 45 MCG/ACT inhaler Inhale 2 puffs into the lungs every 4 (four) hours as needed.  Marland Kitchen levothyroxine (SYNTHROID, LEVOTHROID) 75 MCG tablet TAKE 1 TABLET BY MOUTH DAILY.  . mirtazapine (REMERON) 7.5 MG tablet Take 1 tablet (7.5 mg total) by mouth at bedtime.  . mometasone (NASONEX) 50 MCG/ACT nasal spray Place 2 sprays into the nose daily as needed.  . [DISCONTINUED] dronabinol (MARINOL) 5 MG capsule Take 2.5 mg by mouth 2 (two) times daily before lunch and supper.

## 2014-01-08 DIAGNOSIS — Z0000 Encounter for general adult medical examination without abnormal findings: Secondary | ICD-10-CM | POA: Insufficient documentation

## 2014-01-08 NOTE — Assessment & Plan Note (Signed)
She has continued to improve her exercise tolerance and no longer feels symptomatic if she exercises without supplemental 02

## 2014-01-08 NOTE — Assessment & Plan Note (Signed)
Annual comprehensive exam was done including breast, excluding pelvic exam per patientpreference. All screenings have been addressed and ordered

## 2014-01-08 NOTE — Assessment & Plan Note (Signed)
She had a normal colonoscopy in 2007 .  Given her age and chronic lung disease we will continue screening with annual FOBTS going forward

## 2014-01-08 NOTE — Assessment & Plan Note (Signed)
Resolved with oral metronidazole.  Lifelong probiotics advised.

## 2014-01-08 NOTE — Telephone Encounter (Signed)
See note below

## 2014-01-08 NOTE — Telephone Encounter (Signed)
Ok,  Museum/gallery conservator will need to request it

## 2014-01-09 NOTE — Telephone Encounter (Signed)
Ok , I will get to this as soon as I can as I am really behind in scheduling.

## 2014-01-09 NOTE — Telephone Encounter (Signed)
So is she needing a mammo or her results from 2013?

## 2014-01-09 NOTE — Telephone Encounter (Signed)
She needs Mammo scheduled

## 2014-01-09 NOTE — Telephone Encounter (Signed)
Called patient she is in hurry. She stated take your time. FYI

## 2014-01-15 ENCOUNTER — Encounter: Payer: Self-pay | Admitting: Internal Medicine

## 2014-01-15 ENCOUNTER — Other Ambulatory Visit (INDEPENDENT_AMBULATORY_CARE_PROVIDER_SITE_OTHER): Payer: Medicare Other

## 2014-01-15 DIAGNOSIS — Z1211 Encounter for screening for malignant neoplasm of colon: Secondary | ICD-10-CM | POA: Diagnosis not present

## 2014-01-15 LAB — FECAL OCCULT BLOOD, IMMUNOCHEMICAL: FECAL OCCULT BLD: POSITIVE — AB

## 2014-01-17 NOTE — Telephone Encounter (Signed)
Patient stated she prefers to have you suggest the GI doctor you prefer. Patient stated also could this be because of her recent episode of C- Diff.?

## 2014-01-17 NOTE — Telephone Encounter (Signed)
Left message for pt to return my call.

## 2014-01-22 NOTE — Telephone Encounter (Signed)
Called and notified pt of 01/20/14 unread message from Dr. Derrel Nip. Pt verbalized understanding.

## 2014-01-24 ENCOUNTER — Other Ambulatory Visit: Payer: Medicare Other

## 2014-01-24 ENCOUNTER — Encounter: Payer: Self-pay | Admitting: Emergency Medicine

## 2014-01-24 ENCOUNTER — Other Ambulatory Visit: Payer: Self-pay | Admitting: Pulmonary Disease

## 2014-01-24 DIAGNOSIS — J479 Bronchiectasis, uncomplicated: Secondary | ICD-10-CM

## 2014-01-28 LAB — RESPIRATORY CULTURE OR RESPIRATORY AND SPUTUM CULTURE: GRAM STAIN: NONE SEEN

## 2014-01-29 ENCOUNTER — Encounter: Payer: Self-pay | Admitting: Pulmonary Disease

## 2014-01-31 NOTE — Addendum Note (Signed)
Addended by: Crecencio Mc on: 01/31/2014 12:53 PM   Modules accepted: Orders

## 2014-02-07 ENCOUNTER — Other Ambulatory Visit (INDEPENDENT_AMBULATORY_CARE_PROVIDER_SITE_OTHER): Payer: Medicare Other

## 2014-02-07 DIAGNOSIS — Z1211 Encounter for screening for malignant neoplasm of colon: Secondary | ICD-10-CM

## 2014-02-07 LAB — FECAL OCCULT BLOOD, IMMUNOCHEMICAL: Fecal Occult Bld: POSITIVE — AB

## 2014-02-10 ENCOUNTER — Encounter: Payer: Self-pay | Admitting: Internal Medicine

## 2014-02-10 DIAGNOSIS — R195 Other fecal abnormalities: Secondary | ICD-10-CM

## 2014-02-11 ENCOUNTER — Encounter: Payer: Self-pay | Admitting: Internal Medicine

## 2014-02-13 ENCOUNTER — Encounter: Payer: Self-pay | Admitting: Emergency Medicine

## 2014-02-13 ENCOUNTER — Ambulatory Visit: Payer: Self-pay | Admitting: Internal Medicine

## 2014-02-13 DIAGNOSIS — M810 Age-related osteoporosis without current pathological fracture: Secondary | ICD-10-CM | POA: Diagnosis not present

## 2014-02-13 DIAGNOSIS — R922 Inconclusive mammogram: Secondary | ICD-10-CM | POA: Diagnosis not present

## 2014-02-13 DIAGNOSIS — Z1231 Encounter for screening mammogram for malignant neoplasm of breast: Secondary | ICD-10-CM | POA: Diagnosis not present

## 2014-02-13 DIAGNOSIS — Z13820 Encounter for screening for osteoporosis: Secondary | ICD-10-CM | POA: Diagnosis not present

## 2014-02-14 ENCOUNTER — Telehealth: Payer: Self-pay | Admitting: Internal Medicine

## 2014-02-14 DIAGNOSIS — M810 Age-related osteoporosis without current pathological fracture: Secondary | ICD-10-CM

## 2014-02-14 NOTE — Telephone Encounter (Signed)
She has osteoporosis by recent bone density scan.  I would like ot make sure she is getting 1800 mg calcium daily through diet and supplements, 1000 units of Vit D3,  And we should discuss therapy at her next office visit

## 2014-02-15 ENCOUNTER — Encounter: Payer: Self-pay | Admitting: Internal Medicine

## 2014-02-15 NOTE — Telephone Encounter (Signed)
Patient notified as requested. 

## 2014-02-19 ENCOUNTER — Telehealth: Payer: Self-pay | Admitting: Internal Medicine

## 2014-02-19 DIAGNOSIS — R928 Other abnormal and inconclusive findings on diagnostic imaging of breast: Secondary | ICD-10-CM

## 2014-02-19 NOTE — Telephone Encounter (Signed)
Patient notified but as of yet patient has not heard from [facility].

## 2014-02-19 NOTE — Telephone Encounter (Signed)
Hannah Kline  has reported that Her mammogram was abnormal on the left .   She will be contacted to get additional films and an ultrasound the facility. If she has not heard from them yet, let us know  

## 2014-02-21 ENCOUNTER — Encounter: Payer: Self-pay | Admitting: Internal Medicine

## 2014-02-21 ENCOUNTER — Other Ambulatory Visit: Payer: Self-pay

## 2014-02-21 DIAGNOSIS — R197 Diarrhea, unspecified: Secondary | ICD-10-CM | POA: Diagnosis not present

## 2014-02-21 DIAGNOSIS — R195 Other fecal abnormalities: Secondary | ICD-10-CM | POA: Diagnosis not present

## 2014-02-21 MED ORDER — MIRTAZAPINE 7.5 MG PO TABS: 7.5000 mg | ORAL_TABLET | Freq: Every day | ORAL | Status: DC

## 2014-02-22 ENCOUNTER — Ambulatory Visit: Payer: Self-pay | Admitting: Internal Medicine

## 2014-02-22 DIAGNOSIS — R922 Inconclusive mammogram: Secondary | ICD-10-CM | POA: Diagnosis not present

## 2014-02-22 DIAGNOSIS — N6459 Other signs and symptoms in breast: Secondary | ICD-10-CM | POA: Diagnosis not present

## 2014-02-22 DIAGNOSIS — R197 Diarrhea, unspecified: Secondary | ICD-10-CM | POA: Diagnosis not present

## 2014-02-26 ENCOUNTER — Encounter: Payer: Self-pay | Admitting: Internal Medicine

## 2014-02-26 ENCOUNTER — Telehealth: Payer: Self-pay | Admitting: Internal Medicine

## 2014-02-26 NOTE — Telephone Encounter (Signed)
Dr. Allen Norris called to discuss patient with dr. Derrel Nip notified Dr. Allen Norris not in office and that Dr. Derrel Nip will be in at 27 ,  [place]

## 2014-02-26 NOTE — Telephone Encounter (Signed)
Please advise 

## 2014-03-18 ENCOUNTER — Encounter: Payer: Self-pay | Admitting: Cardiovascular Disease

## 2014-03-18 ENCOUNTER — Telehealth: Payer: Self-pay | Admitting: *Deleted

## 2014-03-18 ENCOUNTER — Ambulatory Visit (INDEPENDENT_AMBULATORY_CARE_PROVIDER_SITE_OTHER): Payer: Medicare Other | Admitting: Cardiovascular Disease

## 2014-03-18 VITALS — BP 120/72 | HR 74 | Ht 68.0 in | Wt 121.8 lb

## 2014-03-18 DIAGNOSIS — R0602 Shortness of breath: Secondary | ICD-10-CM

## 2014-03-18 DIAGNOSIS — R634 Abnormal weight loss: Secondary | ICD-10-CM

## 2014-03-18 DIAGNOSIS — J479 Bronchiectasis, uncomplicated: Secondary | ICD-10-CM

## 2014-03-18 DIAGNOSIS — A0472 Enterocolitis due to Clostridium difficile, not specified as recurrent: Secondary | ICD-10-CM

## 2014-03-18 DIAGNOSIS — I4891 Unspecified atrial fibrillation: Secondary | ICD-10-CM

## 2014-03-18 DIAGNOSIS — I951 Orthostatic hypotension: Secondary | ICD-10-CM

## 2014-03-18 NOTE — Patient Instructions (Signed)
You are doing well. No medication changes were made.  Please call us if you have new issues that need to be addressed before your next appt.  Your physician wants you to follow-up in: 6 months.  You will receive a reminder letter in the mail two months in advance. If you don't receive a letter, please call our office to schedule the follow-up appointment.   

## 2014-03-18 NOTE — Telephone Encounter (Signed)
Spoke with pt, complaining of increased fatigue and weakness. Has been treated for c-diff recently, has completed Metronidazole. Denies any diarrhea currently. Is eating and drinking well. Denies chest pain, fever, SOB. States she just feels tired and weaker than normal, has been ongoing since December when she was given antibiotic for bronchitis.  Has appointment with Dr. Rockey Situ today. Appt scheduled with Dr. Derrel Nip for tomorrow to evaluate. Advised to call back with worsening symptoms.

## 2014-03-18 NOTE — Progress Notes (Signed)
Patient ID: Hannah Kline, female    DOB: [birth year], 80 y.o.   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  In followup today, she reports having C. difficile in January 2015 after having a treatment with Cipro for bronchiectasis/bronchitis. Symptoms resolved after Flagyl though recurred and she again had a course of Flagyl. This stopped last week. Since then she has had significant general malaise, weakness particularly of her legs. She reports no abdominal discomfort, bowel movements that are not back to normal though not diarrhea. Has been think she has slightly increased cough but symptoms are not consistent with prior upper respiratory infections. She is concerned about recurrent C. Difficile. No recent episodes of atrial fibrillation. Still with significant chronic stable sputum production worse every other day.   In the past, she has failed sotalol. Amiodarone was discontinued given underlying lung disease. She was started on  Flecainide 50 mg twice a day with good success.  Episode of atrial fibrillation on April 27th to the 29th 2014. She came to the office, had an EKG. She took digoxin x2,  extra flecainide. This was the longest episode of atrial fibrillation she has had. She is symptomatic in atrial fibrillation. She was also given pradaxa and took his twice a day.   Since her last clinic visit in May 2014, she has had one episode of atrial fibrillation. She took extra flecainide, digoxin, pradaxa 1 dose and bystolic 5 mg pill. Symptoms started at 5 PM, she went  to bed and when she woke up in the morning her atrial fibrillation had resolved.  Other  Episodes of tachycardia in July 2013, October 2013.   EKG shows Normal sinus rhythm with rate 76 beats per minute with  incomplete right bundle branch block, left anterior fascicular block    Outpatient Encounter Prescriptions as of 03/18/2014  Medication Sig  . dabigatran (PRADAXA) 150 MG CAPS capsule Take 150 mg by mouth as needed.   . digoxin (LANOXIN) 0.25 MG tablet Take 1 tablet (0.25 mg total) by mouth daily as needed.  . dorzolamide-timolol (COSOPT) 22.3-6.8 MG/ML ophthalmic solution Place 1 drop into the left eye every 12 (twelve) hours.   . flecainide (TAMBOCOR) 50 MG tablet Take 50 mg by mouth 2 (two) times daily.  . Fluticasone-Salmeterol (ADVAIR) 250-50 MCG/DOSE AEPB Inhale 1 puff into the lungs 2 (two) times daily.  Marland Kitchen latanoprost (XALATAN) 0.005 % ophthalmic solution Place 1 drop into the left eye At bedtime.  . levalbuterol (XOPENEX HFA) 45 MCG/ACT inhaler Inhale 2 puffs into the lungs every 4 (four) hours as needed.  Marland Kitchen levothyroxine (SYNTHROID, LEVOTHROID) 75 MCG tablet TAKE 1 TABLET BY MOUTH DAILY.  . mirtazapine (REMERON) 7.5 MG tablet Take 1 tablet (7.5 mg total) by mouth at bedtime.  . mometasone (NASONEX) 50 MCG/ACT nasal spray Place 2 sprays into the nose daily as needed.    Review of Systems  Constitutional: Positive for fatigue.  HENT: Negative.   Eyes: Negative.   Respiratory: Negative.   Cardiovascular: Negative.   Gastrointestinal: Negative.   Endocrine: Negative.   Musculoskeletal: Negative.   Skin: Negative.   Allergic/Immunologic: Negative.   Neurological: Positive for weakness.  Hematological: Negative.   Psychiatric/Behavioral: Negative.   All other systems reviewed and are negative.    BP 120/72  Pulse 74  Ht 5\' 8"  (1.727 m)  Wt 121 lb 12 oz (55.225 kg)  BMI 18.52 kg/m2  Physical Exam  Nursing note and vitals reviewed. Constitutional: She is oriented to person, place, and time. She appears well-developed and well-nourished.  HENT:  Head: Normocephalic.  Nose: Nose normal.  Mouth/Throat: Oropharynx is clear and moist.  Eyes: Conjunctivae are normal. Pupils  are equal, round, and reactive to light.  Neck: Normal range of motion. Neck supple. No JVD present.  Cardiovascular: Normal rate, regular rhythm, S1 normal, S2 normal, normal heart sounds and intact distal pulses.  Exam reveals no gallop and no friction rub.   No murmur heard. Pulmonary/Chest: Effort normal. No respiratory distress. She has wheezes. She has rales. She exhibits no tenderness.  Abdominal: Soft. Bowel sounds are normal. She exhibits no distension. There is no tenderness.  Musculoskeletal: Normal range of motion. She exhibits no edema and no tenderness.  Lymphadenopathy:    She has no cervical adenopathy.  Neurological: She is alert and oriented to person, place, and time. Coordination normal.  Skin: Skin is warm and dry. No rash noted. No erythema.  Psychiatric: She has a normal mood and affect. Her behavior is normal. Judgment and thought content normal.    Assessment and Plan

## 2014-03-18 NOTE — Assessment & Plan Note (Signed)
We ambulated her in the office today. Saturations maintained in the low to mid 90s. Quick recovery at rest.  Despite slightly increased cough, shortness of breath, she does not feel she has active infection

## 2014-03-18 NOTE — Assessment & Plan Note (Signed)
2 courses of Flagyl. Last antibiotic dose one week ago, now with worsening malaise and weakness over the past week. Suggested she discuss this with Dr. Derrel Nip. unable to exclude recurrent infection . If she test positive, may need vancomycin by mouth

## 2014-03-18 NOTE — Assessment & Plan Note (Signed)
No recent episodes of atrial fibrillation. No medication changes at this time

## 2014-03-18 NOTE — Assessment & Plan Note (Signed)
Blood pressure appears to be relatively stable today. Denies having any dizziness or orthostatic symptoms.

## 2014-03-18 NOTE — Assessment & Plan Note (Signed)
Recent weight loss, exacerbated by C. difficile

## 2014-03-19 ENCOUNTER — Ambulatory Visit (INDEPENDENT_AMBULATORY_CARE_PROVIDER_SITE_OTHER): Payer: Medicare Other | Admitting: Internal Medicine

## 2014-03-19 ENCOUNTER — Encounter: Payer: Self-pay | Admitting: Internal Medicine

## 2014-03-19 VITALS — BP 107/59 | HR 71 | Temp 97.4°F | Wt 123.0 lb

## 2014-03-19 DIAGNOSIS — R634 Abnormal weight loss: Secondary | ICD-10-CM | POA: Diagnosis not present

## 2014-03-19 DIAGNOSIS — A0472 Enterocolitis due to Clostridium difficile, not specified as recurrent: Secondary | ICD-10-CM

## 2014-03-19 DIAGNOSIS — E559 Vitamin D deficiency, unspecified: Secondary | ICD-10-CM | POA: Diagnosis not present

## 2014-03-19 MED ORDER — ONDANSETRON HCL 4 MG PO TABS: 4.0000 mg | ORAL_TABLET | Freq: Three times a day (TID) | ORAL | Status: DC | PRN

## 2014-03-19 NOTE — Progress Notes (Signed)
Pre visit review using our clinic review tool, if applicable. No additional management support is needed unless otherwise documented below in the visit note. 

## 2014-03-19 NOTE — Progress Notes (Signed)
Patient ID: Hannah Kline, female   DOB: 09/25/1932, 80 y.o.   MRN: [id number]   Patient Active Problem List   Diagnosis Date Noted  . Abnormal mammogram 02/19/2014  . Osteoporosis 02/14/2014  . Visit for preventive health examination 01/08/2014  . Colitis, Clostridium difficile 11/23/2013  . Bronchiectasis with acute exacerbation 10/24/2013  . Rhinitis, nonallergic 10/10/2012  . Orthostatic hypotension 09/22/2012  . Weight loss 09/22/2012  . Hypoxemia requiring supplemental oxygen 02/21/2012  . Chronic sinusitis 01/03/2012  . Pulmonary nodule 01/03/2012  . Screening for colon cancer 10/05/2011  . Screening for breast cancer 10/05/2011  . Bacterial infection due to Pseudomonas   . ATRIAL FIBRILLATION 09/15/2010  . BRONCHIECTASIS 09/15/2010  . C O P D 09/15/2010    Subjective:  CC:   Chief Complaint  Patient presents with  . Diarrhea    Thinks she may have C-dif, has been treated for it severa times lately and thinks she still has it. Has not taken any antibiotics since January but the diarrhea has been off and on.   . Fatigue    HPI:   Hannah Kline is a 80 y.o. female who presents for Persistent malaise, loose stools, and weakness.  "I feel terrible"  For the past few weeks, she has been getting worse .  " I Can barely walk" History:  Had a resp infection in December treated with high dose cipro,  Developed c dificile colitis which was treated with flagyl.  FOBT was positive and after referral to GU she was diagnosed with c dif relapse so Dr Allen Norris prescribed 14 days of  flagyl,  Which she finishedt last Monday.   BMs are occurring every morning, never solid, mostly gelatinous.  Still taking Align daily,  But appetite is worse.  And she has been feeling worse and worse,  desats  To 89%  when walking across the room    Past Medical History  Diagnosis Date  . Atrial fibrillation   . History of Mycobacterium avium complex infection     lung disease  . COPD (chronic obstructive  pulmonary disease)    . Bronchiectasis   . Hepatitis     h/o INH  . Bacterial infection due to Pseudomonas   . Pleurisy 12/08/11    Past Surgical History  Procedure Laterality Date  . Shoulder arthroscopy      right       The following portions of the patient's history were reviewed and updated as appropriate: Allergies, current medications, and problem list.    Review of Systems:   Patient denies headache, fevers, malaise, unintentional weight loss, skin rash, eye pain, sinus congestion and sinus pain, sore throat, dysphagia,  hemoptysis , cough, dyspnea, wheezing, chest pain, palpitations, orthopnea, edema, abdominal pain, nausea, melena, diarrhea, constipation, flank pain, dysuria, hematuria, urinary  Frequency, nocturia, numbness, tingling, seizures,  Focal weakness, Loss of consciousness,  Tremor, insomnia, depression, anxiety, and suicidal ideation.     History   Social History  . Marital Status: Married    Spouse Name: N/A    Number of Children: 49  . Years of Education: N/A   Occupational History  . 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 Smoking status: Never Smoker   . Smokeless tobacco: Never Used  . Alcohol Use: 0.0 - 0.5 oz/week    0-1 drink(s) per week  . Drug Use: No  . Sexual Activity: Not on file   Other Topics Concern  . Not on file  Social History Narrative  . No narrative on file    Objective:  Filed Vitals:   03/19/14 1550  BP: 107/59  Pulse: 71  Temp: 97.4 F (36.3 C)     General appearance: alert, cooperative and appears stated age Ears: normal TM's and external ear canals both ears Throat: lips, mucosa, and tongue normal; teeth and gums normal Neck: no adenopathy, no carotid bruit, supple, symmetrical, trachea midline and thyroid not enlarged, symmetric, no tenderness/mass/nodules Back: symmetric, no curvature. ROM normal. No CVA tenderness. Lungs: clear to auscultation bilaterally Heart: regular rate and  rhythm, S1, S2 normal, no murmur, click, rub or gallop Abdomen: soft, non-tender; bowel sounds normal; no masses,  no organomegaly Pulses: 2+ and symmetric Skin: Skin color, texture, turgor normal. No rashes or lesions Lymph nodes: Cervical, supraclavicular, and axillary nodes normal.  Assessment and Plan:  Colitis, Clostridium difficile Treating second relapse with 6 week vancomycin taper   Weight loss She discontinued remeron after a trial of more than one month due to lack of effect on appetite.    Updated Medication List Outpatient Encounter Prescriptions as of 03/19/2014  Medication Sig  . dabigatran (PRADAXA) 150 MG CAPS capsule Take 150 mg by mouth as needed.   . digoxin (LANOXIN) 0.25 MG tablet Take 1 tablet (0.25 mg total) by mouth daily as needed.  . dorzolamide-timolol (COSOPT) 22.3-6.8 MG/ML ophthalmic solution Place 1 drop into the left eye every 12 (twelve) hours.   . flecainide (TAMBOCOR) 50 MG tablet Take 50 mg by mouth 2 (two) times daily.  . Fluticasone-Salmeterol (ADVAIR) 250-50 MCG/DOSE AEPB Inhale 1 puff into the lungs 2 (two) times daily.  Marland Kitchen latanoprost (XALATAN) 0.005 % ophthalmic solution Place 1 drop into the left eye At bedtime.  . levalbuterol (XOPENEX HFA) 45 MCG/ACT inhaler Inhale 2 puffs into the lungs every 4 (four) hours as needed.  Marland Kitchen levothyroxine (SYNTHROID, LEVOTHROID) 75 MCG tablet TAKE 1 TABLET BY MOUTH DAILY.  . mometasone (NASONEX) 50 MCG/ACT nasal spray Place 2 sprays into the nose daily as needed.  . [DISCONTINUED] mirtazapine (REMERON) 7.5 MG tablet Take 1 tablet (7.5 mg total) by mouth at bedtime.  . ondansetron (ZOFRAN) 4 MG tablet Take 1 tablet (4 mg total) by mouth every 8 (eight) hours as needed for nausea or vomiting.     Orders Placed This Encounter  Procedures  . Sedimentation rate  . C-reactive protein  . CBC with Differential  . Prealbumin  . Comp Met (CMET)  . Vit D  25 hydroxy (rtn osteoporosis monitoring)    No Follow-up  on file.

## 2014-03-20 ENCOUNTER — Telehealth: Payer: Self-pay | Admitting: *Deleted

## 2014-03-20 ENCOUNTER — Other Ambulatory Visit: Payer: Self-pay | Admitting: Internal Medicine

## 2014-03-20 DIAGNOSIS — A0472 Enterocolitis due to Clostridium difficile, not specified as recurrent: Secondary | ICD-10-CM | POA: Diagnosis not present

## 2014-03-20 LAB — COMPREHENSIVE METABOLIC PANEL
ALBUMIN: 3.4 g/dL — AB (ref 3.5–5.2)
ALT: 12 U/L (ref 0–35)
AST: 18 U/L (ref 0–37)
Alkaline Phosphatase: 58 U/L (ref 39–117)
BUN: 15 mg/dL (ref 6–23)
CALCIUM: 8.6 mg/dL (ref 8.4–10.5)
CHLORIDE: 104 meq/L (ref 96–112)
CO2: 28 mEq/L (ref 19–32)
Creatinine, Ser: 0.6 mg/dL (ref 0.4–1.2)
GFR: 106.39 mL/min (ref >60.00)
GLUCOSE: 84 mg/dL (ref 70–99)
Potassium: 3.7 mEq/L (ref 3.5–5.1)
Sodium: 138 mEq/L (ref 135–145)
Total Bilirubin: 0.5 mg/dL (ref 0.2–1.2)
Total Protein: 7.4 g/dL (ref 6.0–8.3)

## 2014-03-20 LAB — CBC WITH DIFFERENTIAL/PLATELET
BASOS PCT: 0.2 % (ref 0.0–3.0)
Basophils Absolute: 0 10*3/uL (ref 0.0–0.1)
Eosinophils Absolute: 0.6 10*3/uL (ref 0.0–0.7)
Eosinophils Relative: 4.5 % (ref 0.0–5.0)
HCT: 38.2 % (ref 36.0–46.0)
HEMOGLOBIN: 12.7 g/dL (ref 12.0–15.0)
LYMPHS PCT: 14.9 % (ref 12.0–46.0)
Lymphs Abs: 1.9 10*3/uL (ref 0.7–4.0)
MCHC: 33.2 g/dL (ref 30.0–36.0)
MCV: 95.3 fl (ref 78.0–100.0)
Monocytes Absolute: 0.9 10*3/uL (ref 0.1–1.0)
Monocytes Relative: 7.2 % (ref 3.0–12.0)
NEUTROS ABS: 9.3 10*3/uL — AB (ref 1.4–7.7)
Neutrophils Relative %: 73.2 % (ref 43.0–77.0)
Platelets: 279 10*3/uL (ref 150.0–400.0)
RBC: 4.01 Mil/uL (ref 3.87–5.11)
RDW: 14.8 % (ref 11.5–15.5)
WBC: 12.7 10*3/uL — ABNORMAL HIGH (ref 4.0–10.5)

## 2014-03-20 LAB — PREALBUMIN: Prealbumin: 18.5 mg/dL (ref 17.0–34.0)

## 2014-03-20 LAB — CLOSTRIDIUM DIFFICILE(ARMC)

## 2014-03-20 LAB — SEDIMENTATION RATE: Sed Rate: 80 mm/hr — ABNORMAL HIGH (ref 0–22)

## 2014-03-20 LAB — C-REACTIVE PROTEIN: CRP: 1.6 mg/dL (ref 0.5–20.0)

## 2014-03-20 LAB — VITAMIN D 25 HYDROXY (VIT D DEFICIENCY, FRACTURES): VIT D 25 HYDROXY: 28 ng/mL — AB (ref 30–89)

## 2014-03-20 MED ORDER — VANCOMYCIN HCL 125 MG PO CAPS: 125.0000 mg | ORAL_CAPSULE | Freq: Four times a day (QID) | ORAL | Status: DC

## 2014-03-20 MED ORDER — SACCHAROMYCES BOULARDII 250 MG PO CAPS: 250.0000 mg | ORAL_CAPSULE | Freq: Two times a day (BID) | ORAL | Status: DC

## 2014-03-20 NOTE — Assessment & Plan Note (Signed)
Treating second relapse with 6 week vancomycin taper

## 2014-03-20 NOTE — Assessment & Plan Note (Signed)
She discontinued remeron after a trial of more than one month due to lack of effect on appetite.

## 2014-03-20 NOTE — Telephone Encounter (Signed)
Pt notified and verbalized understanding. Appt scheduled for 04/03/14 for follow up with Dr. Derrel Nip. Pt states she does not need a GI referral at this time, will discuss with Dr. Derrel Nip at her follow up appointment.

## 2014-03-20 NOTE — Telephone Encounter (Signed)
[facility] lab called with Positve result for C-Diff

## 2014-03-20 NOTE — Telephone Encounter (Signed)
Her stool test was again positive for  C dif.  Since this is her second relapse, we are changing antibiotic therapy to vancomycin  The dose is one tablet 4 times daily for 2 weeks, Then one tablet twice daily for 1 week, Then one tablet daily for 1 week Then one tablet every 3 days for 2 weeks  I have sent the first 4 weeks prescription to the pharmacy I also sent the recommended probiotic to her pharmacy as well , to take twice daily instead of the one she is taking   Follow up with me in 2 weeks. If she wants to see GI show does she prefer?

## 2014-03-21 ENCOUNTER — Encounter: Payer: Self-pay | Admitting: Internal Medicine

## 2014-03-21 DIAGNOSIS — Z85820 Personal history of malignant melanoma of skin: Secondary | ICD-10-CM | POA: Diagnosis not present

## 2014-03-21 DIAGNOSIS — D485 Neoplasm of uncertain behavior of skin: Secondary | ICD-10-CM | POA: Diagnosis not present

## 2014-03-21 DIAGNOSIS — L820 Inflamed seborrheic keratosis: Secondary | ICD-10-CM | POA: Diagnosis not present

## 2014-03-21 DIAGNOSIS — H4010X0 Unspecified open-angle glaucoma, stage unspecified: Secondary | ICD-10-CM | POA: Diagnosis not present

## 2014-03-21 DIAGNOSIS — L821 Other seborrheic keratosis: Secondary | ICD-10-CM | POA: Diagnosis not present

## 2014-03-21 DIAGNOSIS — Z85828 Personal history of other malignant neoplasm of skin: Secondary | ICD-10-CM | POA: Diagnosis not present

## 2014-03-22 ENCOUNTER — Telehealth: Payer: Self-pay | Admitting: *Deleted

## 2014-03-22 DIAGNOSIS — Z0279 Encounter for issue of other medical certificate: Secondary | ICD-10-CM

## 2014-03-22 NOTE — Telephone Encounter (Signed)
PA signed and faxed.  Called left detailed message medication has been faxed,

## 2014-03-22 NOTE — Telephone Encounter (Signed)
PA started and printed off, given to Dr. Derrel Nip for signature and completion.

## 2014-03-25 ENCOUNTER — Telehealth: Payer: Self-pay | Admitting: Internal Medicine

## 2014-03-25 ENCOUNTER — Encounter: Payer: Self-pay | Admitting: Internal Medicine

## 2014-03-25 DIAGNOSIS — A0472 Enterocolitis due to Clostridium difficile, not specified as recurrent: Secondary | ICD-10-CM

## 2014-03-25 NOTE — Telephone Encounter (Signed)
Hannah Kline,  This is a priority.  Patient has 2nd relapse of c dificile ,  Waiting for vancomycin  To be prior authorized by insurance so currenlty not on treatment.  wants to see Dr Luiz Ochoa,  Can you expedite the prior auth for vancomycin/

## 2014-03-25 NOTE — Telephone Encounter (Signed)
Received approval from [employer] today called pharmacy to advise if they have received PA. Pa received and patient aware script at pharmacy.

## 2014-03-25 NOTE — Telephone Encounter (Signed)
Pt states she has not been able to get [facility].  Has not used [facility].  Would like [place] to [facility] and if they cannot get it she would like to try [place] instead.  States she has had C. Diff x months and needs the medication.

## 2014-03-26 ENCOUNTER — Encounter: Payer: Self-pay | Admitting: Internal Medicine

## 2014-03-26 NOTE — Telephone Encounter (Signed)
Patient notified on 03/25/14 PA approved called pharmacy and enquired PA at pharmacy also.

## 2014-04-03 ENCOUNTER — Ambulatory Visit (INDEPENDENT_AMBULATORY_CARE_PROVIDER_SITE_OTHER): Payer: Medicare Other | Admitting: Internal Medicine

## 2014-04-03 ENCOUNTER — Encounter: Payer: Self-pay | Admitting: Internal Medicine

## 2014-04-03 VITALS — BP 100/60 | HR 71 | Temp 97.6°F | Resp 18 | Ht 68.0 in | Wt 121.2 lb

## 2014-04-03 DIAGNOSIS — A0472 Enterocolitis due to Clostridium difficile, not specified as recurrent: Secondary | ICD-10-CM

## 2014-04-03 DIAGNOSIS — R197 Diarrhea, unspecified: Secondary | ICD-10-CM | POA: Diagnosis not present

## 2014-04-03 NOTE — Progress Notes (Signed)
Patient ID: Hannah Kline, female   DOB: October 12, 1932, 80 y.o.   MRN: [id number]   Patient Active Problem List   Diagnosis Date Noted  . Abnormal mammogram 02/19/2014  . Osteoporosis 02/14/2014  . Visit for preventive health examination 01/08/2014  . Colitis, Clostridium difficile 11/23/2013  . Bronchiectasis with acute exacerbation 10/24/2013  . Rhinitis, nonallergic 10/10/2012  . Orthostatic hypotension 09/22/2012  . Weight loss 09/22/2012  . Hypoxemia requiring supplemental oxygen 02/21/2012  . Chronic sinusitis 01/03/2012  . Pulmonary nodule 01/03/2012  . Screening for colon cancer 10/05/2011  . Screening for breast cancer 10/05/2011  . Bacterial infection due to Pseudomonas   . ATRIAL FIBRILLATION 09/15/2010  . BRONCHIECTASIS 09/15/2010  . C O P D 09/15/2010    Subjective:  CC:   Chief Complaint  Patient presents with  . Follow-up    2 week follow up on C-Diff last 9 days were ok today since 4 this morning 5 BMs    HPI:   Hannah Kline is a 80 y.o. female who presents for Follow up on diarrhea secondary to recurrent c dificile colitis.  Started taking vancomycin on May 8th and had rapid improvement in symptoms .,  But starting this morning she had a recurrence of watery stools.  She had recently changed her diet to include greens and dairy yesterday.    Past Medical History  Diagnosis Date  . Atrial fibrillation   . History of Mycobacterium avium complex infection     lung disease  . COPD (chronic obstructive pulmonary disease)    . Bronchiectasis   . Hepatitis     h/o INH  . Bacterial infection due to Pseudomonas   . Pleurisy 12/08/11    Past Surgical History  Procedure Laterality Date  . Shoulder arthroscopy      right       The following portions of the patient's history were reviewed and updated as appropriate: Allergies, current medications, and problem list.    Review of Systems:   Patient denies headache, fevers, malaise, unintentional weight  loss, skin rash, eye pain, sinus congestion and sinus pain, sore throat, dysphagia,  hemoptysis , cough, dyspnea, wheezing, chest pain, palpitations, orthopnea, edema, abdominal pain, nausea, melena, diarrhea, constipation, flank pain, dysuria, hematuria, urinary  Frequency, nocturia, numbness, tingling, seizures,  Focal weakness, Loss of consciousness,  Tremor, insomnia, depression, anxiety, and suicidal ideation.     History   Social History  . Marital Status: Married    Spouse Name: N/A    Number of Children: 72  . Years of Education: N/A   Occupational History  . 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 Smoking status: Never Smoker   . Smokeless tobacco: Never Used  . Alcohol Use: 0.0 - 0.5 oz/week    0-1 drink(s) per week  . Drug Use: No  . Sexual Activity: Not on file   Other Topics Concern  . Not on file   Social History Narrative  . No narrative on file    Objective:  Filed Vitals:   04/03/14 1026  BP: 100/60  Pulse: 71  Temp: 97.6 F (36.4 C)  Resp: 18     General appearance: alert, cooperative and appears stated age Ears: normal TM's and external ear canals both ears Throat: lips, mucosa, and tongue normal; teeth and gums normal Neck: no adenopathy, no carotid bruit, supple, symmetrical, trachea midline and thyroid not enlarged, symmetric, no tenderness/mass/nodules Back: symmetric, no curvature. ROM normal. No CVA tenderness. Lungs:  clear to auscultation bilaterally Heart: regular rate and rhythm, S1, S2 normal, no murmur, click, rub or gallop Abdomen: soft, non-tender; bowel sounds normal; no masses,  no organomegaly Pulses: 2+ and symmetric Skin: Skin color, texture, turgor normal. No rashes or lesions Lymph nodes: Cervical, supraclavicular, and axillary nodes normal.  Assessment and Plan:  Colitis, Clostridium difficile With improvement on vancomycin started 9 days ago .  Today's diarrhea may represent relapse,  Given her  leukocytosis.  She will simplify diet and if diarrhea persists, we will repeat C dificile studies.    Updated Medication List Outpatient Encounter Prescriptions as of 04/03/2014  Medication Sig  . dorzolamide-timolol (COSOPT) 22.3-6.8 MG/ML ophthalmic solution Place 1 drop into the left eye every 12 (twelve) hours.   . Fluticasone-Salmeterol (ADVAIR) 250-50 MCG/DOSE AEPB Inhale 1 puff into the lungs 2 (two) times daily.  Marland Kitchen latanoprost (XALATAN) 0.005 % ophthalmic solution Place 1 drop into the left eye At bedtime.  . levalbuterol (XOPENEX HFA) 45 MCG/ACT inhaler Inhale 2 puffs into the lungs every 4 (four) hours as needed.  Marland Kitchen levothyroxine (SYNTHROID, LEVOTHROID) 75 MCG tablet TAKE 1 TABLET BY MOUTH DAILY.  Marland Kitchen saccharomyces boulardii (FLORASTOR) 250 MG capsule Take 1 capsule (250 mg total) by mouth 2 (two) times daily.  . vancomycin (VANCOCIN) 125 MG capsule Take 1 capsule (125 mg total) by mouth 4 (four) times daily. For two weeks then twice daily for 7 days,  Then daily for 7 days  . dabigatran (PRADAXA) 150 MG CAPS capsule Take 150 mg by mouth as needed.   . digoxin (LANOXIN) 0.25 MG tablet Take 1 tablet (0.25 mg total) by mouth daily as needed.  . flecainide (TAMBOCOR) 50 MG tablet Take 50 mg by mouth 2 (two) times daily.  . mometasone (NASONEX) 50 MCG/ACT nasal spray Place 2 sprays into the nose daily as needed.  . ondansetron (ZOFRAN) 4 MG tablet Take 1 tablet (4 mg total) by mouth every 8 (eight) hours as needed for nausea or vomiting.     Orders Placed This Encounter  Procedures  . Magnesium  . Comprehensive metabolic panel  . CBC with Differential    No Follow-up on file.

## 2014-04-03 NOTE — Patient Instructions (Signed)
Your loose stools today may be from your recent dietary changes  Eliminate greens and hard to digest foods  If no improvement by Monday.  Return ot [facility] to drop off another stool sample for  Cdificile

## 2014-04-03 NOTE — Progress Notes (Signed)
Pre-visit discussion using our clinic review tool. No additional management support is needed unless otherwise documented below in the visit note.  

## 2014-04-04 ENCOUNTER — Encounter: Payer: Self-pay | Admitting: Internal Medicine

## 2014-04-04 LAB — COMPREHENSIVE METABOLIC PANEL
ALT: 14 U/L (ref 0–35)
AST: 23 U/L (ref 0–37)
Albumin: 3.5 g/dL (ref 3.5–5.2)
Alkaline Phosphatase: 70 U/L (ref 39–117)
BILIRUBIN TOTAL: 0.4 mg/dL (ref 0.2–1.2)
BUN: 11 mg/dL (ref 6–23)
CO2: 28 meq/L (ref 19–32)
CREATININE: 0.8 mg/dL (ref 0.4–1.2)
Calcium: 8.8 mg/dL (ref 8.4–10.5)
Chloride: 102 mEq/L (ref 96–112)
GFR: 77.87 mL/min (ref >60.00)
Glucose, Bld: 109 mg/dL — ABNORMAL HIGH (ref 70–99)
Potassium: 4.2 mEq/L (ref 3.5–5.1)
Sodium: 137 mEq/L (ref 135–145)
Total Protein: 7.5 g/dL (ref 6.0–8.3)

## 2014-04-04 LAB — CBC WITH DIFFERENTIAL/PLATELET
BASOS ABS: 0 10*3/uL (ref 0.0–0.1)
BASOS PCT: 0.2 % (ref 0.0–3.0)
Eosinophils Absolute: 0.2 10*3/uL (ref 0.0–0.7)
Eosinophils Relative: 1.7 % (ref 0.0–5.0)
HEMATOCRIT: 40.4 % (ref 36.0–46.0)
HEMOGLOBIN: 13.3 g/dL (ref 12.0–15.0)
Lymphocytes Relative: 11 % — ABNORMAL LOW (ref 12.0–46.0)
Lymphs Abs: 1.5 10*3/uL (ref 0.7–4.0)
MCHC: 33 g/dL (ref 30.0–36.0)
MCV: 95.5 fl (ref 78.0–100.0)
MONOS PCT: 8.9 % (ref 3.0–12.0)
Monocytes Absolute: 1.2 10*3/uL — ABNORMAL HIGH (ref 0.1–1.0)
NEUTROS ABS: 10.8 10*3/uL — AB (ref 1.4–7.7)
Neutrophils Relative %: 78.2 % — ABNORMAL HIGH (ref 43.0–77.0)
Platelets: 274 10*3/uL (ref 150.0–400.0)
RBC: 4.23 Mil/uL (ref 3.87–5.11)
RDW: 15.1 % (ref 11.5–15.5)
WBC: 13.8 10*3/uL — ABNORMAL HIGH (ref 4.0–10.5)

## 2014-04-04 LAB — MAGNESIUM: MAGNESIUM: 2 mg/dL (ref 1.5–2.5)

## 2014-04-06 NOTE — Assessment & Plan Note (Signed)
With improvement on vancomycin started 9 days ago .  Today's diarrhea may represent relapse,  Given her leukocytosis.  She will simplify diet and if diarrhea persists, we will repeat C dificile studies.

## 2014-04-08 ENCOUNTER — Other Ambulatory Visit: Payer: Self-pay | Admitting: *Deleted

## 2014-04-08 ENCOUNTER — Telehealth: Payer: Self-pay | Admitting: Internal Medicine

## 2014-04-08 ENCOUNTER — Other Ambulatory Visit: Payer: Self-pay | Admitting: Internal Medicine

## 2014-04-08 DIAGNOSIS — K5289 Other specified noninfective gastroenteritis and colitis: Secondary | ICD-10-CM | POA: Diagnosis not present

## 2014-04-08 DIAGNOSIS — E039 Hypothyroidism, unspecified: Secondary | ICD-10-CM

## 2014-04-08 LAB — CLOSTRIDIUM DIFFICILE(ARMC)

## 2014-04-08 MED ORDER — LEVOTHYROXINE SODIUM 75 MCG PO TABS: ORAL_TABLET | ORAL | Status: DC

## 2014-04-11 DIAGNOSIS — E46 Unspecified protein-calorie malnutrition: Secondary | ICD-10-CM | POA: Diagnosis not present

## 2014-04-11 DIAGNOSIS — R634 Abnormal weight loss: Secondary | ICD-10-CM | POA: Diagnosis not present

## 2014-04-11 DIAGNOSIS — A0472 Enterocolitis due to Clostridium difficile, not specified as recurrent: Secondary | ICD-10-CM | POA: Diagnosis not present

## 2014-04-17 DIAGNOSIS — C44611 Basal cell carcinoma of skin of unspecified upper limb, including shoulder: Secondary | ICD-10-CM | POA: Diagnosis not present

## 2014-04-17 DIAGNOSIS — D485 Neoplasm of uncertain behavior of skin: Secondary | ICD-10-CM | POA: Diagnosis not present

## 2014-04-17 DIAGNOSIS — L821 Other seborrheic keratosis: Secondary | ICD-10-CM | POA: Diagnosis not present

## 2014-04-17 DIAGNOSIS — L820 Inflamed seborrheic keratosis: Secondary | ICD-10-CM | POA: Diagnosis not present

## 2014-05-13 NOTE — Telephone Encounter (Signed)
This encounter was created in error - please disregard.

## 2014-05-18 ENCOUNTER — Other Ambulatory Visit: Payer: Self-pay | Admitting: Internal Medicine

## 2014-05-20 NOTE — Telephone Encounter (Signed)
Ok refill? 

## 2014-05-21 ENCOUNTER — Ambulatory Visit (INDEPENDENT_AMBULATORY_CARE_PROVIDER_SITE_OTHER): Payer: Medicare Other | Admitting: Pulmonary Disease

## 2014-05-21 ENCOUNTER — Encounter: Payer: Self-pay | Admitting: Pulmonary Disease

## 2014-05-21 VITALS — BP 116/68 | HR 71 | Ht 68.0 in | Wt 120.0 lb

## 2014-05-21 DIAGNOSIS — J479 Bronchiectasis, uncomplicated: Secondary | ICD-10-CM | POA: Diagnosis not present

## 2014-05-21 DIAGNOSIS — Z9981 Dependence on supplemental oxygen: Secondary | ICD-10-CM

## 2014-05-21 DIAGNOSIS — J449 Chronic obstructive pulmonary disease, unspecified: Secondary | ICD-10-CM

## 2014-05-21 DIAGNOSIS — R0902 Hypoxemia: Secondary | ICD-10-CM

## 2014-05-21 MED ORDER — ONDANSETRON HCL 4 MG PO TABS: 4.0000 mg | ORAL_TABLET | Freq: Three times a day (TID) | ORAL | Status: DC | PRN

## 2014-05-21 NOTE — Progress Notes (Signed)
Subjective:    Patient ID: Hannah Kline, female    DOB: [birth year], 80 y.o.   MRN: [id number] Synopsis: Hannah Kline established care with the [facility] pulmonary office in February 2013 for bronchiectasis due to [place]. She was diagnosed at age 50 in [place]. She was treated with rifampin ethambutol and clarithromycin for 2 years. Since then she does not believe she was ever grown out MAI.   HPI  05/21/2014 ROV > Hannah Kline has been struggling since the last visit. She finally is over the C. difficile colitis but she overall is very fatigued. She no longer has diarrhea but she continues to have some nausea. She does not feel that her lungs are any worse than baseline but overall she is short of breath quite frequently with exertion. She attributes this to overall fatigue. She is not coughing more than normal and she is not producing more mucus than baseline. She is not wheezing more than normal. She continues to work with regular exercise routines throughout the course of the week. She is using her oxygen more frequently during exercise. She continues to take a probiotic pill daily.   Past Medical History  Diagnosis Date  . Atrial fibrillation   . History of Mycobacterium avium complex infection     lung disease  . COPD (chronic obstructive pulmonary disease)    . Bronchiectasis   . Hepatitis     h/o INH  . Bacterial infection due to Pseudomonas   . Pleurisy 12/08/11     Review of Systems  Constitutional: Positive for fatigue. Negative for fever, chills, appetite change and unexpected weight change.  HENT: Negative for congestion, ear pain and nosebleeds.   Respiratory: Positive for shortness of breath. Negative for cough and choking.   Cardiovascular: Negative for chest pain and leg swelling.       Objective:   Physical Exam  Filed Vitals:   05/21/14 1423  BP: 116/68  Pulse: 71  Height: 5\' 8"  (1.727 m)  Weight: 120 lb (54.432 kg)  SpO2: 97%   room  air   Gen: well appearing, no acute distress HEENT: NCAT, EOMi, OP clear,  PULM: few wheezes bilaterally, good air movement CV: Irreg irreg, no mgr, no JVD AB: BS+, soft, nontender, no hsm Ext: warm, no edema, no clubbing, no cyanosis  Review of 2012 sputum microbiology: Pan sensitive pseudomonas  09/2010 Full PFT: Ratio 63%, FEV1 1.49L (67% pred)  10/2011 CT Chest: impressive upper lobe cystic bronchiectasis, scattered lower lobe tree-in-bud abnormalities and scattered nodules; One RML solid nodule measures 7.63mm  January 2013 CT chest [facility]: Stable upper lobe cystic bronchiectasis also present in the right middle lobe. Scattered tree in bud abnormalities and nodules throughout all lung fields roughly unchanged from prior. Right middle lobe nodule is no longer visible but there is an 73mm nodule in the right upper lobe.  December 2013 simple spirometry FEV1 0.8 L  10/11/2012 Sputum: PSEUDOMONAS AERUGINOSA       Antibiotic  Sensitivity  Microscan  Status    CEFEPIME  Sensitive  2  Final    CEFTAZIDIME  Sensitive  <=1  Final    CIPROFLOXACIN  Intermediate  2  Final    GENTAMICIN  Sensitive  <=1  Final    IMIPENEM  Sensitive  1  Final    LEVOFLOXACIN  Intermediate  4  Final    PIP/TAZO  Sensitive  <=4  Final    TOBRAMYCIN  Sensitive  <=1  Final  January 2014 simple spirometry >> not reproducible by ATS standards  January 2014 Sputum AFB > neg x3 11/2013 FEV1 0.93 L     Assessment & Plan:   BRONCHIECTASIS Hannah Kline is quite weak after recovering from C. difficile but she is not clearly having an exacerbation of bronchiectasis. Today we talked about the importance of ongoing mucociliary clearance. She would like to try Breo instead of Advair for a while to see if that helps.  She understands that she has a flare bronchiectasis in the future we will have to avoid ciprofloxacin. This will necessitate hospitalization, placement of a PICC line, and home antibiotics.  Overall her lung  disease has been worsening over the last several years which is not unexpected. The best thing we can do is try to keep her as active as possible and keep her nutrition up.  Plan: -Trial of Breo -Continue exercise -Use oxygen regularly -Continue efforts with nutrition and weight gain -Consider repeat simple spirometry versus full PFTs on the next visit  C O P D Trial Breo, hold Advair during this time  Hypoxemia requiring supplemental oxygen We were unable to reproduce hypoxemia during today's visit. She says that it's worse in the mornings which could mean that she has more chest congestion then. We will have a home health agency go out to her house and perform ambulatory oximetry testing in the mornings to see if she needs 2 L of oxygen with exertion. If she does then we will prescribe a portable oxygen concentrator.    Updated Medication List Outpatient Encounter Prescriptions as of 05/21/2014  Medication Sig  . dabigatran (PRADAXA) 150 MG CAPS capsule Take 150 mg by mouth as needed.   . digoxin (LANOXIN) 0.25 MG tablet Take 1 tablet (0.25 mg total) by mouth daily as needed.  . dorzolamide-timolol (COSOPT) 22.3-6.8 MG/ML ophthalmic solution Place 1 drop into the left eye every 12 (twelve) hours.   . Fluticasone-Salmeterol (ADVAIR) 250-50 MCG/DOSE AEPB Inhale 1 puff into the lungs 2 (two) times daily.  Marland Kitchen latanoprost (XALATAN) 0.005 % ophthalmic solution Place 1 drop into the left eye At bedtime.  . levalbuterol (XOPENEX HFA) 45 MCG/ACT inhaler Inhale 2 puffs into the lungs every 4 (four) hours as needed.  Marland Kitchen levothyroxine (SYNTHROID, LEVOTHROID) 75 MCG tablet TAKE 1 TABLET BY MOUTH DAILY.  . mometasone (NASONEX) 50 MCG/ACT nasal spray Place 2 sprays into the nose daily as needed.  . ondansetron (ZOFRAN) 4 MG tablet Take 1 tablet (4 mg total) by mouth every 8 (eight) hours as needed for nausea or vomiting.  Marland Kitchen OVER THE COUNTER MEDICATION VST 3- probiotic 1 capsule daily.  . flecainide  (TAMBOCOR) 50 MG tablet Take 50 mg by mouth 2 (two) times daily.  . [DISCONTINUED] saccharomyces boulardii (FLORASTOR) 250 MG capsule Take 1 capsule (250 mg total) by mouth 2 (two) times daily.  . [DISCONTINUED] vancomycin (VANCOCIN) 125 MG capsule Take 1 capsule (125 mg total) by mouth 4 (four) times daily. For two weeks then twice daily for 7 days,  Then daily for 7 days

## 2014-05-21 NOTE — Assessment & Plan Note (Signed)
Trial Breo, hold Advair during this time

## 2014-05-21 NOTE — Assessment & Plan Note (Signed)
Hannah Kline is quite weak after recovering from C. difficile but she is not clearly having an exacerbation of bronchiectasis. Today we talked about the importance of ongoing mucociliary clearance. She would like to try Breo instead of Advair for a while to see if that helps.  She understands that she has a flare bronchiectasis in the future we will have to avoid ciprofloxacin. This will necessitate hospitalization, placement of a PICC line, and home antibiotics.  Overall her lung disease has been worsening over the last several years which is not unexpected. The best thing we can do is try to keep her as active as possible and keep her nutrition up.  Plan: -Trial of Breo -Continue exercise -Use oxygen regularly -Continue efforts with nutrition and weight gain -Consider repeat simple spirometry versus full PFTs on the next visit

## 2014-05-21 NOTE — Assessment & Plan Note (Signed)
We were unable to reproduce hypoxemia during today's visit. She says that it's worse in the mornings which could mean that she has more chest congestion then. We will have a home health agency go out to her house and perform ambulatory oximetry testing in the mornings to see if she needs 2 L of oxygen with exertion. If she does then we will prescribe a portable oxygen concentrator.

## 2014-05-21 NOTE — Patient Instructions (Signed)
Use your Xopenex twice a day for two weeks Use the Breo instead of the of the Advair for two weeks. You only need to take Breo once per day We will give you a prescription for a portable oxygen concentrator We will see you back in 3 months or sooner if needed

## 2014-06-04 DIAGNOSIS — R21 Rash and other nonspecific skin eruption: Secondary | ICD-10-CM | POA: Diagnosis not present

## 2014-06-04 DIAGNOSIS — L820 Inflamed seborrheic keratosis: Secondary | ICD-10-CM | POA: Diagnosis not present

## 2014-06-04 DIAGNOSIS — L299 Pruritus, unspecified: Secondary | ICD-10-CM | POA: Diagnosis not present

## 2014-06-04 DIAGNOSIS — T148 Other injury of unspecified body region: Secondary | ICD-10-CM | POA: Diagnosis not present

## 2014-06-04 DIAGNOSIS — L821 Other seborrheic keratosis: Secondary | ICD-10-CM | POA: Diagnosis not present

## 2014-06-20 DIAGNOSIS — M7989 Other specified soft tissue disorders: Secondary | ICD-10-CM | POA: Diagnosis not present

## 2014-06-20 DIAGNOSIS — M25579 Pain in unspecified ankle and joints of unspecified foot: Secondary | ICD-10-CM | POA: Diagnosis not present

## 2014-06-20 DIAGNOSIS — R9431 Abnormal electrocardiogram [ECG] [EKG]: Secondary | ICD-10-CM | POA: Diagnosis not present

## 2014-06-20 DIAGNOSIS — M009 Pyogenic arthritis, unspecified: Secondary | ICD-10-CM | POA: Diagnosis not present

## 2014-06-20 LAB — TROPONIN I: Troponin-I: <0.02 ng/mL

## 2014-06-20 LAB — BASIC METABOLIC PANEL
ANION GAP: 5 — AB (ref 7–16)
BUN: 17 mg/dL (ref 7–18)
CALCIUM: 9 mg/dL (ref 8.5–10.1)
CO2: 29 mmol/L (ref 21–32)
Chloride: 104 mmol/L (ref 98–107)
Creatinine: 0.83 mg/dL (ref 0.60–1.30)
EGFR (African American): >60
GLUCOSE: 100 mg/dL — AB (ref 65–99)
Osmolality: 277 (ref 275–301)
POTASSIUM: 4.1 mmol/L (ref 3.5–5.1)
Sodium: 138 mmol/L (ref 136–145)

## 2014-06-20 LAB — CBC
HCT: 41.9 % (ref 35.0–47.0)
HGB: 13.3 g/dL (ref 12.0–16.0)
MCH: 30.5 pg (ref 26.0–34.0)
MCHC: 31.8 g/dL — ABNORMAL LOW (ref 32.0–36.0)
MCV: 96 fL (ref 80–100)
Platelet: 257 10*3/uL (ref 150–440)
RBC: 4.36 10*6/uL (ref 3.80–5.20)
RDW: 14 % (ref 11.5–14.5)
WBC: 16.7 10*3/uL — AB (ref 3.6–11.0)

## 2014-06-21 ENCOUNTER — Inpatient Hospital Stay: Payer: Self-pay | Admitting: Internal Medicine

## 2014-06-21 DIAGNOSIS — J449 Chronic obstructive pulmonary disease, unspecified: Secondary | ICD-10-CM | POA: Diagnosis not present

## 2014-06-21 DIAGNOSIS — M25473 Effusion, unspecified ankle: Secondary | ICD-10-CM | POA: Diagnosis not present

## 2014-06-21 DIAGNOSIS — M009 Pyogenic arthritis, unspecified: Secondary | ICD-10-CM | POA: Diagnosis present

## 2014-06-21 DIAGNOSIS — J4489 Other specified chronic obstructive pulmonary disease: Secondary | ICD-10-CM | POA: Diagnosis not present

## 2014-06-21 DIAGNOSIS — M6580 Other synovitis and tenosynovitis, unspecified site: Secondary | ICD-10-CM | POA: Diagnosis present

## 2014-06-21 DIAGNOSIS — I4891 Unspecified atrial fibrillation: Secondary | ICD-10-CM | POA: Diagnosis not present

## 2014-06-21 DIAGNOSIS — M25579 Pain in unspecified ankle and joints of unspecified foot: Secondary | ICD-10-CM | POA: Diagnosis present

## 2014-06-21 DIAGNOSIS — R9431 Abnormal electrocardiogram [ECG] [EKG]: Secondary | ICD-10-CM | POA: Diagnosis not present

## 2014-06-21 LAB — BODY FLUID CELL COUNT WITH DIFFERENTIAL
Basophil: 0 %
Eosinophil: 0 %
Lymphocytes: 1 %
Neutrophils: 98 %
Nucleated Cell Count: 23095 /mm3
Other Cells BF: 0 %
Other Mononuclear Cells: 1 %

## 2014-06-21 LAB — SYNOVIAL FLUID, CRYSTAL: CRYSTALS, JOINT FLUID: NONE SEEN

## 2014-06-22 DIAGNOSIS — I4891 Unspecified atrial fibrillation: Secondary | ICD-10-CM | POA: Diagnosis not present

## 2014-06-22 DIAGNOSIS — M009 Pyogenic arthritis, unspecified: Secondary | ICD-10-CM | POA: Diagnosis not present

## 2014-06-22 DIAGNOSIS — J449 Chronic obstructive pulmonary disease, unspecified: Secondary | ICD-10-CM | POA: Diagnosis not present

## 2014-06-22 LAB — CBC WITH DIFFERENTIAL/PLATELET
BASOS PCT: 0.4 %
Basophil #: 0 10*3/uL (ref 0.0–0.1)
EOS ABS: 0.4 10*3/uL (ref 0.0–0.7)
Eosinophil %: 3.5 %
HCT: 36.3 % (ref 35.0–47.0)
HGB: 11.5 g/dL — ABNORMAL LOW (ref 12.0–16.0)
Lymphocyte #: 1.3 10*3/uL (ref 1.0–3.6)
Lymphocyte %: 11.2 %
MCH: 30.5 pg (ref 26.0–34.0)
MCHC: 31.7 g/dL — ABNORMAL LOW (ref 32.0–36.0)
MCV: 96 fL (ref 80–100)
MONO ABS: 1.4 x10 3/mm — AB (ref 0.2–0.9)
Monocyte %: 12 %
NEUTROS ABS: 8.3 10*3/uL — AB (ref 1.4–6.5)
Neutrophil %: 72.9 %
Platelet: 195 10*3/uL (ref 150–440)
RBC: 3.78 10*6/uL — ABNORMAL LOW (ref 3.80–5.20)
RDW: 14.1 % (ref 11.5–14.5)
WBC: 11.4 10*3/uL — ABNORMAL HIGH (ref 3.6–11.0)

## 2014-06-25 LAB — BODY FLUID CULTURE

## 2014-06-26 LAB — CULTURE, BLOOD (SINGLE)

## 2014-07-08 DIAGNOSIS — L820 Inflamed seborrheic keratosis: Secondary | ICD-10-CM | POA: Diagnosis not present

## 2014-07-08 DIAGNOSIS — C44319 Basal cell carcinoma of skin of other parts of face: Secondary | ICD-10-CM | POA: Diagnosis not present

## 2014-07-08 DIAGNOSIS — C44611 Basal cell carcinoma of skin of unspecified upper limb, including shoulder: Secondary | ICD-10-CM | POA: Diagnosis not present

## 2014-07-11 ENCOUNTER — Ambulatory Visit (INDEPENDENT_AMBULATORY_CARE_PROVIDER_SITE_OTHER): Payer: Medicare Other | Admitting: Internal Medicine

## 2014-07-11 ENCOUNTER — Encounter: Payer: Self-pay | Admitting: Internal Medicine

## 2014-07-11 VITALS — BP 136/66 | HR 59 | Temp 97.9°F | Resp 16 | Ht 68.0 in | Wt 120.5 lb

## 2014-07-11 DIAGNOSIS — R5381 Other malaise: Secondary | ICD-10-CM

## 2014-07-11 DIAGNOSIS — R197 Diarrhea, unspecified: Secondary | ICD-10-CM | POA: Diagnosis not present

## 2014-07-11 DIAGNOSIS — R630 Anorexia: Secondary | ICD-10-CM | POA: Diagnosis not present

## 2014-07-11 DIAGNOSIS — M25579 Pain in unspecified ankle and joints of unspecified foot: Secondary | ICD-10-CM

## 2014-07-11 DIAGNOSIS — T3695XA Adverse effect of unspecified systemic antibiotic, initial encounter: Secondary | ICD-10-CM

## 2014-07-11 DIAGNOSIS — A0472 Enterocolitis due to Clostridium difficile, not specified as recurrent: Secondary | ICD-10-CM

## 2014-07-11 DIAGNOSIS — R5383 Other fatigue: Principal | ICD-10-CM

## 2014-07-11 DIAGNOSIS — R350 Frequency of micturition: Secondary | ICD-10-CM | POA: Diagnosis not present

## 2014-07-11 DIAGNOSIS — E441 Mild protein-calorie malnutrition: Secondary | ICD-10-CM

## 2014-07-11 DIAGNOSIS — K521 Toxic gastroenteritis and colitis: Secondary | ICD-10-CM

## 2014-07-11 DIAGNOSIS — M25571 Pain in right ankle and joints of right foot: Secondary | ICD-10-CM

## 2014-07-11 LAB — CBC WITH DIFFERENTIAL/PLATELET
Basophils Absolute: 0.1 10*3/uL (ref 0.0–0.1)
Basophils Relative: 0.5 % (ref 0.0–3.0)
Eosinophils Absolute: 0.4 10*3/uL (ref 0.0–0.7)
Eosinophils Relative: 3.4 % (ref 0.0–5.0)
HEMATOCRIT: 37.8 % (ref 36.0–46.0)
HEMOGLOBIN: 12.3 g/dL (ref 12.0–15.0)
LYMPHS ABS: 1.2 10*3/uL (ref 0.7–4.0)
Lymphocytes Relative: 10.9 % — ABNORMAL LOW (ref 12.0–46.0)
MCHC: 32.7 g/dL (ref 30.0–36.0)
MCV: 93.4 fl (ref 78.0–100.0)
MONOS PCT: 7.9 % (ref 3.0–12.0)
Monocytes Absolute: 0.9 10*3/uL (ref 0.1–1.0)
NEUTROS ABS: 8.9 10*3/uL — AB (ref 1.4–7.7)
Neutrophils Relative %: 77.3 % — ABNORMAL HIGH (ref 43.0–77.0)
Platelets: 333 10*3/uL (ref 150.0–400.0)
RBC: 4.05 Mil/uL (ref 3.87–5.11)
RDW: 14.5 % (ref 11.5–15.5)
WBC: 11.4 10*3/uL — ABNORMAL HIGH (ref 4.0–10.5)

## 2014-07-11 LAB — URINALYSIS, ROUTINE W REFLEX MICROSCOPIC
Bilirubin Urine: NEGATIVE
KETONES UR: NEGATIVE
NITRITE: NEGATIVE
Specific Gravity, Urine: 1.01 (ref 1.000–1.030)
Total Protein, Urine: NEGATIVE
UROBILINOGEN UA: 0.2 (ref 0.0–1.0)
Urine Glucose: NEGATIVE
pH: 6 (ref 5.0–8.0)

## 2014-07-11 LAB — COMPREHENSIVE METABOLIC PANEL
ALBUMIN: 3.3 g/dL — AB (ref 3.5–5.2)
ALT: 12 U/L (ref 0–35)
AST: 19 U/L (ref 0–37)
Alkaline Phosphatase: 75 U/L (ref 39–117)
BUN: 10 mg/dL (ref 6–23)
CALCIUM: 8.9 mg/dL (ref 8.4–10.5)
CHLORIDE: 99 meq/L (ref 96–112)
CO2: 30 meq/L (ref 19–32)
Creatinine, Ser: 0.8 mg/dL (ref 0.4–1.2)
GFR: 77.82 mL/min (ref >60.00)
GLUCOSE: 83 mg/dL (ref 70–99)
POTASSIUM: 3.8 meq/L (ref 3.5–5.1)
Sodium: 136 mEq/L (ref 135–145)
Total Bilirubin: 0.5 mg/dL (ref 0.2–1.2)
Total Protein: 7.8 g/dL (ref 6.0–8.3)

## 2014-07-11 LAB — URIC ACID: Uric Acid, Serum: 3.6 mg/dL (ref 2.4–7.0)

## 2014-07-11 MED ORDER — MEGESTROL ACETATE 20 MG PO TABS: 20.0000 mg | ORAL_TABLET | Freq: Two times a day (BID) | ORAL | Status: DC

## 2014-07-11 NOTE — Patient Instructions (Signed)
If you have another liquid stool please collect it for testing for c dificile in the containers I have provided you  I am prescribing Megace, an appetite stimulant to try for 30 days  ,  You can take it twice daily.    The joint fluid was NEGATIVE for infection.  They hospital sent you home on antibiotics only because you felt better after antibiotics.  I still think the joint pain was due to an inflammatory arthritis,  So if the cultures drawn today are negatiive nad the diarrhea does not return, we may try a short prednisone taper

## 2014-07-11 NOTE — Progress Notes (Signed)
Pre-visit discussion using our clinic review tool. No additional management support is needed unless otherwise documented below in the visit note.  

## 2014-07-11 NOTE — Progress Notes (Signed)
Patient ID: Hannah Kline, female   DOB: October 05, 1932, 80 y.o.   MRN: [id number]  Patient Active Problem List   Diagnosis Date Noted  . Anorexia 07/14/2014  . Protein-calorie malnutrition, mild 07/14/2014  . Abnormal mammogram 02/19/2014  . Osteoporosis 02/14/2014  . Visit for preventive health examination 01/08/2014  . Colitis, Clostridium difficile 11/23/2013  . Bronchiectasis with acute exacerbation 10/24/2013  . Rhinitis, nonallergic 10/10/2012  . Orthostatic hypotension 09/22/2012  . Weight loss 09/22/2012  . Hypoxemia requiring supplemental oxygen 02/21/2012  . Chronic sinusitis 01/03/2012  . Pulmonary nodule 01/03/2012  . Screening for colon cancer 10/05/2011  . Screening for breast cancer 10/05/2011  . Bacterial infection due to Pseudomonas   . ATRIAL FIBRILLATION 09/15/2010  . BRONCHIECTASIS 09/15/2010  . C O P D 09/15/2010    Subjective:  CC:   Chief Complaint  Patient presents with  . Acute Visit    Dry mouth, nausea, weakness.   . Nausea    dry heaves , emeisis at times.  . Diarrhea    1 episode today patient stated this morning,     HPI:   Hannah Kline is a 80 y.o. female who presents for evaluation of   1) Multiple symptoms.  Persistent Malaise,  Anorexia,  Dry mouth.    Nausea has been chronic and intermittent , not daily, for the past several weeks,  Aggravated by coughing.  using zofran a lot more often but not daily .  Has had a couple of good days ,  But has not gone back  To exercise.   2)  2 loose stools today.  She has a History of recurrent  c dificile colitis which failed therapy with oral flagyl requiring several weeks of vancomycin therapy.     Was recently admitted to [facility] for sudden onset of severe joint pain and swelling suspected to be septic arthritis. Had a scrape on her lower tibia and a pedicure the day before.  Joint was aspirated and neutrophil count was high.  Culture was negative, but since she felt better after a day of abx ,  Was advised  by admitting team to continue the antibiotics.  Has been taking a probiotic daily.    She finished 10 days ago   Had two loose stools this morning after a solid stool earlier same day.    Past Medical History  Diagnosis Date  . Atrial fibrillation   . History of Mycobacterium avium complex infection     lung disease  . COPD (chronic obstructive pulmonary disease)    . Bronchiectasis   . Hepatitis     h/o INH  . Bacterial infection due to Pseudomonas   . Pleurisy 12/08/11    Past Surgical History  Procedure Laterality Date  . Shoulder arthroscopy      right       The following portions of the patient's history were reviewed and updated as appropriate: Allergies, current medications, and problem list.    Review of Systems:   Patient denies headache, fevers, malaise, unintentional weight loss, skin rash, eye pain, sinus congestion and sinus pain, sore throat, dysphagia,  hemoptysis , cough, dyspnea, wheezing, chest pain, palpitations, orthopnea, edema, abdominal pain, nausea, melena, diarrhea, constipation, flank pain, dysuria, hematuria, urinary  Frequency, nocturia, numbness, tingling, seizures,  Focal weakness, Loss of consciousness,  Tremor, insomnia, depression, anxiety, and suicidal ideation.     History   Social History  . Marital Status: Married    Spouse Name: N/A  Number of Children: 5  . Years of Education: N/A   Occupational History  . 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 Smoking status: Never Smoker   . Smokeless tobacco: Never Used  . Alcohol Use: 0.0 - 0.5 oz/week    0-1 drink(s) per week  . Drug Use: No  . Sexual Activity: Not on file   Other Topics Concern  . Not on file   Social History Narrative  . No narrative on file    Objective:  Filed Vitals:   07/11/14 0809  BP: 136/66  Pulse: 59  Temp: 97.9 F (36.6 C)  Resp: 16     General appearance: alert, cooperative and appears stated age Ears: normal TM's and  external ear canals both ears Throat: lips, mucosa, and tongue normal; teeth and gums normal Neck: no adenopathy, no carotid bruit, supple, symmetrical, trachea midline and thyroid not enlarged, symmetric, no tenderness/mass/nodules Back: symmetric, no curvature. ROM normal. No CVA tenderness. Lungs: clear to auscultation bilaterally Heart: regular rate and rhythm, S1, S2 normal, no murmur, click, rub or gallop Abdomen: soft, non-tender; bowel sounds normal; no masses,  no organomegaly Pulses: 2+ and symmetric Skin: Skin color, texture, turgor normal. No rashes or lesions Lymph nodes: Cervical, supraclavicular, and axillary nodes normal.  Assessment and Plan:  Anorexia Trial of megace offered.    Colitis, Clostridium difficile Her last occurrence in May failed flagyl and required 4 weeks of vancomycin. Recurrence is suspected given recent use of antibiotics and sudden onset of loose stools today. She has a mild leukocytosis .    Repeat stool studies are pending.  We will treat with oral vancomycin if positive.   Protein-calorie malnutrition, mild Secondary to chronic illness.  She has had an unintentional weight loss of 28 lb,since Nov 2011  With 10 lbs occurring since Dec 2014.  Body mass index is 18.33 kg/(m^2).  Will recommend trial of megace and addition of Boost/Ensure once her GI illness has been determined and treated.     Updated Medication List Outpatient Encounter Prescriptions as of 07/11/2014  Medication Sig  . dabigatran (PRADAXA) 150 MG CAPS capsule Take 150 mg by mouth as needed.   . digoxin (LANOXIN) 0.25 MG tablet Take 1 tablet (0.25 mg total) by mouth daily as needed.  . dorzolamide-timolol (COSOPT) 22.3-6.8 MG/ML ophthalmic solution Place 1 drop into the left eye every 12 (twelve) hours.   . flecainide (TAMBOCOR) 50 MG tablet Take 50 mg by mouth 2 (two) times daily.  . Fluticasone-Salmeterol (ADVAIR) 250-50 MCG/DOSE AEPB Inhale 1 puff into the lungs 2 (two) times  daily.  Marland Kitchen latanoprost (XALATAN) 0.005 % ophthalmic solution Place 1 drop into the left eye At bedtime.  . levalbuterol (XOPENEX HFA) 45 MCG/ACT inhaler Inhale 2 puffs into the lungs every 4 (four) hours as needed.  Marland Kitchen levothyroxine (SYNTHROID, LEVOTHROID) 75 MCG tablet TAKE 1 TABLET BY MOUTH DAILY.  . mometasone (NASONEX) 50 MCG/ACT nasal spray Place 2 sprays into the nose daily as needed.  . ondansetron (ZOFRAN) 4 MG tablet Take 1 tablet (4 mg total) by mouth every 8 (eight) hours as needed for nausea or vomiting.  Marland Kitchen OVER THE COUNTER MEDICATION VST 3- probiotic 1 capsule daily.  . megestrol (MEGACE) 20 MG tablet Take 1 tablet (20 mg total) by mouth 2 (two) times daily.     Orders Placed This Encounter  Procedures  . Urine Culture  . Clostridium difficile EIA  . Blood culture (routine single)  .  CBC with Differential  . Uric acid  . Comp Met (CMET)  . Prealbumin  . Urinalysis, Routine w reflex microscopic  . CBC with Differential  . Uric acid  . Comprehensive metabolic panel    No Follow-up on file.

## 2014-07-12 ENCOUNTER — Encounter: Payer: Self-pay | Admitting: Internal Medicine

## 2014-07-12 LAB — PREALBUMIN: PREALBUMIN: 12.2 mg/dL — AB (ref 17.0–34.0)

## 2014-07-13 ENCOUNTER — Encounter: Payer: Self-pay | Admitting: Internal Medicine

## 2014-07-14 ENCOUNTER — Encounter: Payer: Self-pay | Admitting: Internal Medicine

## 2014-07-14 DIAGNOSIS — R630 Anorexia: Secondary | ICD-10-CM | POA: Insufficient documentation

## 2014-07-14 DIAGNOSIS — E441 Mild protein-calorie malnutrition: Secondary | ICD-10-CM | POA: Insufficient documentation

## 2014-07-14 LAB — URINE CULTURE

## 2014-07-14 NOTE — Assessment & Plan Note (Signed)
Secondary to chronic illness.  She has had an unintentional weight loss of 28 lb,since Nov 2011  With 10 lbs occurring since Dec 2014.  Body mass index is 18.33 kg/(m^2).  Will recommend trial of megace and addition of Boost/Ensure once her GI illness has been determined and treated.

## 2014-07-14 NOTE — Assessment & Plan Note (Addendum)
Trial of megace offered.

## 2014-07-14 NOTE — Assessment & Plan Note (Addendum)
Her last occurrence in May failed flagyl and required 4 weeks of vancomycin. Recurrence is suspected given recent use of antibiotics and sudden onset of loose stools today. She has a mild leukocytosis .    Repeat stool studies are pending.  We will treat with oral vancomycin if positive.

## 2014-07-16 ENCOUNTER — Other Ambulatory Visit: Payer: Self-pay | Admitting: *Deleted

## 2014-07-16 ENCOUNTER — Other Ambulatory Visit: Payer: Self-pay | Admitting: Internal Medicine

## 2014-07-16 DIAGNOSIS — T3695XA Adverse effect of unspecified systemic antibiotic, initial encounter: Principal | ICD-10-CM

## 2014-07-16 DIAGNOSIS — K521 Toxic gastroenteritis and colitis: Secondary | ICD-10-CM

## 2014-07-16 DIAGNOSIS — R197 Diarrhea, unspecified: Secondary | ICD-10-CM | POA: Diagnosis not present

## 2014-07-16 MED ORDER — VANCOMYCIN HCL 125 MG PO CAPS: ORAL_CAPSULE | ORAL | Status: DC

## 2014-07-16 NOTE — Telephone Encounter (Signed)
[facility], states vancomycin went through with insurance, PA not needed.

## 2014-07-16 NOTE — Telephone Encounter (Signed)
I sent a rx for vancomycin to [facility] this morning .  Last time we had a delay due to insurance/PA.  Can you call the pharmacy this AM and ask them if we're going to havre the same  Issue?  Treatment for recurrent c dificil colitis

## 2014-07-17 ENCOUNTER — Encounter: Payer: Self-pay | Admitting: Internal Medicine

## 2014-07-17 LAB — C. DIFFICILE GDH AND TOXIN A/B
C. difficile GDH: NOT DETECTED
C. difficile Toxin A/B: NOT DETECTED

## 2014-07-18 LAB — CULTURE, BLOOD (SINGLE): ORGANISM ID, BACTERIA: NO GROWTH

## 2014-07-23 DIAGNOSIS — H4010X0 Unspecified open-angle glaucoma, stage unspecified: Secondary | ICD-10-CM | POA: Diagnosis not present

## 2014-07-30 DIAGNOSIS — M1710 Unilateral primary osteoarthritis, unspecified knee: Secondary | ICD-10-CM | POA: Diagnosis not present

## 2014-07-30 DIAGNOSIS — M79609 Pain in unspecified limb: Secondary | ICD-10-CM | POA: Diagnosis not present

## 2014-07-30 DIAGNOSIS — IMO0002 Reserved for concepts with insufficient information to code with codable children: Secondary | ICD-10-CM | POA: Diagnosis not present

## 2014-08-05 DIAGNOSIS — C4491 Basal cell carcinoma of skin, unspecified: Secondary | ICD-10-CM | POA: Diagnosis not present

## 2014-08-27 DIAGNOSIS — M65871 Other synovitis and tenosynovitis, right ankle and foot: Secondary | ICD-10-CM | POA: Diagnosis not present

## 2014-08-29 DIAGNOSIS — Z23 Encounter for immunization: Secondary | ICD-10-CM | POA: Diagnosis not present

## 2014-08-30 ENCOUNTER — Other Ambulatory Visit: Payer: Self-pay | Admitting: *Deleted

## 2014-08-30 DIAGNOSIS — E039 Hypothyroidism, unspecified: Secondary | ICD-10-CM

## 2014-08-30 MED ORDER — LEVOTHYROXINE SODIUM 75 MCG PO TABS: ORAL_TABLET | ORAL | Status: DC

## 2014-08-31 ENCOUNTER — Telehealth: Payer: Self-pay | Admitting: Internal Medicine

## 2014-08-31 DIAGNOSIS — E441 Mild protein-calorie malnutrition: Secondary | ICD-10-CM

## 2014-08-31 DIAGNOSIS — E034 Atrophy of thyroid (acquired): Secondary | ICD-10-CM

## 2014-08-31 DIAGNOSIS — R0902 Hypoxemia: Secondary | ICD-10-CM

## 2014-08-31 DIAGNOSIS — E039 Hypothyroidism, unspecified: Secondary | ICD-10-CM

## 2014-08-31 DIAGNOSIS — Z9981 Dependence on supplemental oxygen: Secondary | ICD-10-CM

## 2014-09-02 DIAGNOSIS — C44729 Squamous cell carcinoma of skin of left lower limb, including hip: Secondary | ICD-10-CM | POA: Diagnosis not present

## 2014-09-02 DIAGNOSIS — L820 Inflamed seborrheic keratosis: Secondary | ICD-10-CM | POA: Diagnosis not present

## 2014-09-02 DIAGNOSIS — E039 Hypothyroidism, unspecified: Secondary | ICD-10-CM | POA: Insufficient documentation

## 2014-09-02 DIAGNOSIS — D485 Neoplasm of uncertain behavior of skin: Secondary | ICD-10-CM | POA: Diagnosis not present

## 2014-09-02 DIAGNOSIS — Z85828 Personal history of other malignant neoplasm of skin: Secondary | ICD-10-CM | POA: Diagnosis not present

## 2014-09-02 DIAGNOSIS — C44719 Basal cell carcinoma of skin of left lower limb, including hip: Secondary | ICD-10-CM | POA: Diagnosis not present

## 2014-09-02 DIAGNOSIS — L578 Other skin changes due to chronic exposure to nonionizing radiation: Secondary | ICD-10-CM | POA: Diagnosis not present

## 2014-09-02 MED ORDER — LEVOTHYROXINE SODIUM 75 MCG PO TABS: ORAL_TABLET | ORAL | Status: DC

## 2014-09-02 NOTE — Telephone Encounter (Signed)
Left message for patient to call office.  

## 2014-09-02 NOTE — Telephone Encounter (Signed)
Lab scheduled for 10/07/14

## 2014-09-06 ENCOUNTER — Other Ambulatory Visit (INDEPENDENT_AMBULATORY_CARE_PROVIDER_SITE_OTHER): Payer: Medicare Other

## 2014-09-06 DIAGNOSIS — E441 Mild protein-calorie malnutrition: Secondary | ICD-10-CM | POA: Diagnosis not present

## 2014-09-06 DIAGNOSIS — E039 Hypothyroidism, unspecified: Secondary | ICD-10-CM

## 2014-09-06 LAB — CBC WITH DIFFERENTIAL/PLATELET
BASOS ABS: 0.1 10*3/uL (ref 0.0–0.1)
Basophils Relative: 0.4 % (ref 0.0–3.0)
EOS ABS: 0.4 10*3/uL (ref 0.0–0.7)
Eosinophils Relative: 2.8 % (ref 0.0–5.0)
HEMATOCRIT: 40.1 % (ref 36.0–46.0)
HEMOGLOBIN: 13 g/dL (ref 12.0–15.0)
LYMPHS ABS: 1.8 10*3/uL (ref 0.7–4.0)
Lymphocytes Relative: 13.2 % (ref 12.0–46.0)
MCHC: 32.4 g/dL (ref 30.0–36.0)
MCV: 94.9 fl (ref 78.0–100.0)
MONO ABS: 1.1 10*3/uL — AB (ref 0.1–1.0)
Monocytes Relative: 7.9 % (ref 3.0–12.0)
NEUTROS ABS: 10.3 10*3/uL — AB (ref 1.4–7.7)
Neutrophils Relative %: 75.7 % (ref 43.0–77.0)
Platelets: 267 10*3/uL (ref 150.0–400.0)
RBC: 4.22 Mil/uL (ref 3.87–5.11)
RDW: 16.2 % — AB (ref 11.5–15.5)
WBC: 13.6 10*3/uL — ABNORMAL HIGH (ref 4.0–10.5)

## 2014-09-06 LAB — COMPREHENSIVE METABOLIC PANEL
ALK PHOS: 77 U/L (ref 39–117)
ALT: 14 U/L (ref 0–35)
AST: 24 U/L (ref 0–37)
Albumin: 3.2 g/dL — ABNORMAL LOW (ref 3.5–5.2)
BILIRUBIN TOTAL: 0.7 mg/dL (ref 0.2–1.2)
BUN: 14 mg/dL (ref 6–23)
CO2: 33 mEq/L — ABNORMAL HIGH (ref 19–32)
Calcium: 8.8 mg/dL (ref 8.4–10.5)
Chloride: 102 mEq/L (ref 96–112)
Creatinine, Ser: 0.8 mg/dL (ref 0.4–1.2)
GFR: 73.32 mL/min (ref >60.00)
Glucose, Bld: 76 mg/dL (ref 70–99)
Potassium: 3.5 mEq/L (ref 3.5–5.1)
SODIUM: 137 meq/L (ref 135–145)
TOTAL PROTEIN: 7.3 g/dL (ref 6.0–8.3)

## 2014-09-06 LAB — TSH: TSH: 2.7 u[IU]/mL (ref 0.35–4.50)

## 2014-09-06 LAB — VITAMIN D 25 HYDROXY (VIT D DEFICIENCY, FRACTURES): VITD: 40.73 ng/mL (ref 30.00–100.00)

## 2014-09-08 ENCOUNTER — Encounter: Payer: Self-pay | Admitting: Internal Medicine

## 2014-09-10 DIAGNOSIS — C44629 Squamous cell carcinoma of skin of left upper limb, including shoulder: Secondary | ICD-10-CM | POA: Diagnosis not present

## 2014-09-10 DIAGNOSIS — C44619 Basal cell carcinoma of skin of left upper limb, including shoulder: Secondary | ICD-10-CM | POA: Diagnosis not present

## 2014-09-16 ENCOUNTER — Encounter: Payer: Self-pay | Admitting: Cardiovascular Disease

## 2014-09-16 ENCOUNTER — Ambulatory Visit (INDEPENDENT_AMBULATORY_CARE_PROVIDER_SITE_OTHER): Payer: Medicare Other | Admitting: Cardiovascular Disease

## 2014-09-16 VITALS — BP 118/68 | HR 63 | Ht 68.0 in | Wt 119.5 lb

## 2014-09-16 DIAGNOSIS — R630 Anorexia: Secondary | ICD-10-CM | POA: Diagnosis not present

## 2014-09-16 DIAGNOSIS — I951 Orthostatic hypotension: Secondary | ICD-10-CM | POA: Diagnosis not present

## 2014-09-16 DIAGNOSIS — J479 Bronchiectasis, uncomplicated: Secondary | ICD-10-CM

## 2014-09-16 DIAGNOSIS — I4891 Unspecified atrial fibrillation: Secondary | ICD-10-CM

## 2014-09-16 NOTE — Progress Notes (Signed)
Patient ID: Hannah Kline, female    DOB: December 24, 1931, 80 y.o.   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 history of C. Difficile in genera 2015 after treatment with Cipro for bronchiectasis Symptoms resolved with Flagyl 2  In follow-up, she reports that she is doing well. She denies having any recent episodes of atrial fibrillation She also has had relatively stable pulmonary status with no flareups of her bronchiectasis She is active, no regular exercise. Weight is down 9 pounds over the past year. She does not feel like eating much  EKG shows normal sinus rhythm with rate 63 bpm, no significant ST or T-wave changes  Past medical history In the past, she has failed sotalol. Amiodarone was discontinued given underlying lung disease. She was started on  Flecainide 50 mg twice a day with good success.  Episode of atrial fibrillation on April 27th to the 29th 2014. She came to the office, had an EKG. She took digoxin x2,  extra flecainide. This was the longest episode of atrial fibrillation she has had. She is symptomatic in atrial fibrillation. She was also given pradaxa and took his twice a day.   Other  Episodes of tachycardia in July 2013, October 2013.      Outpatient Encounter Prescriptions as of 09/16/2014  Medication Sig  . dabigatran (PRADAXA) 150 MG CAPS capsule Take 150 mg by mouth as needed.   . digoxin (LANOXIN) 0.25 MG tablet Take 1 tablet (0.25 mg total) by mouth daily as needed.  . dorzolamide-timolol (COSOPT) 22.3-6.8 MG/ML ophthalmic solution Place 1 drop into the left eye every 12 (twelve) hours.   . flecainide (TAMBOCOR) 50 MG tablet Take 50 mg by mouth 2 (two) times daily.  . Fluticasone-Salmeterol (ADVAIR) 250-50 MCG/DOSE AEPB Inhale 1 puff  into the lungs 2 (two) times daily.  Marland Kitchen latanoprost (XALATAN) 0.005 % ophthalmic solution Place 1 drop into the left eye At bedtime.  . levalbuterol (XOPENEX HFA) 45 MCG/ACT inhaler Inhale 2 puffs into the lungs every 4 (four) hours as needed.  Marland Kitchen levothyroxine (SYNTHROID, LEVOTHROID) 75 MCG tablet TAKE 1 TABLET BY MOUTH DAILY.  . megestrol (MEGACE) 20 MG tablet Take 1 tablet (20 mg total) by mouth 2 (two) times daily.  . mometasone (NASONEX) 50 MCG/ACT nasal spray Place 2 sprays into the nose daily as needed.  . ondansetron (ZOFRAN) 4 MG tablet Take 1 tablet (4 mg total) by mouth every 8 (eight) hours as needed for nausea or vomiting.  Marland Kitchen OVER THE COUNTER MEDICATION VST 3- probiotic 1 capsule daily.  . vancomycin (VANCOCIN) 125 MG capsule One capsule by mouth four times daily for 14 days,  then twice daily for 7 days,  Then daily for 7 days   In terms of her social history  reports that she has never smoked. She has never used smokeless tobacco. She reports that she drinks alcohol. She reports that she does not use illicit drugs.  Review of Systems  Constitutional: Negative.   Respiratory: Positive for shortness of breath.   Cardiovascular: Negative.   Gastrointestinal: Negative.   Musculoskeletal: Negative.   Skin: Negative.   Neurological: Negative.   Psychiatric/Behavioral: Negative.   All other systems reviewed and are negative.    BP 118/68 mmHg  Pulse 63  Ht 5\' 8"  (1.727 m)  Wt 119 lb 8 oz (54.205 kg)  BMI 18.17 kg/m2  Physical Exam  Constitutional: She is oriented to person, place, and time. She appears well-developed and well-nourished.  HENT:  Head: Normocephalic.  Nose: Nose normal.  Mouth/Throat: Oropharynx is clear and moist.  Eyes: Conjunctivae are normal. Pupils are equal, round, and reactive to light.  Neck: Normal range of motion. Neck supple. No JVD present.  Cardiovascular: Normal rate, regular rhythm, S1 normal, S2 normal, normal heart sounds and intact distal  pulses.  Exam reveals no gallop and no friction rub.   No murmur heard. Pulmonary/Chest: Effort normal. No respiratory distress. She has decreased breath sounds. She has no wheezes. She has no rales. She exhibits no tenderness.  Abdominal: Soft. Bowel sounds are normal. She exhibits no distension. There is no tenderness.  Musculoskeletal: Normal range of motion. She exhibits no edema or tenderness.  Lymphadenopathy:    She has no cervical adenopathy.  Neurological: She is alert and oriented to person, place, and time. Coordination normal.  Skin: Skin is warm and dry. No rash noted. No erythema.  Psychiatric: She has a normal mood and affect. Her behavior is normal. Judgment and thought content normal.    Assessment and Plan  Nursing note and vitals reviewed.

## 2014-09-16 NOTE — Assessment & Plan Note (Signed)
Additional 9 pound weight loss over the past year. Suggested she have more frequent meals, snacking, high calorie foods

## 2014-09-16 NOTE — Patient Instructions (Addendum)
Your next appointment will be scheduled in our new office located at :  [facility]  [address], [place], [place] [address]   You are doing well. No medication changes were made.  Pradaxa is a twice a day pill, if needed  Please call us if you have new issues that need to be addressed before your next appt.  Your physician wants you to follow-up in: 6 months.  You will receive a reminder letter in the mail two months in advance. If you don't receive a letter, please call our office to schedule the follow-up appointment.

## 2014-09-16 NOTE — Assessment & Plan Note (Signed)
No recent episodes of atrial fibrillation. No changes to her medications. She takes anticoagulation and digoxin only as needed, takes flecainide 50 mg twice a day

## 2014-09-16 NOTE — Assessment & Plan Note (Signed)
Blood pressure stable, no orthostatic symptoms. Warned her against further weight loss

## 2014-09-16 NOTE — Assessment & Plan Note (Signed)
She states that she has had a long stretch with no flareups of her bronchitis with bronchiectasis. Periodically sees pulmonary

## 2014-09-17 DIAGNOSIS — L820 Inflamed seborrheic keratosis: Secondary | ICD-10-CM | POA: Diagnosis not present

## 2014-09-18 ENCOUNTER — Encounter: Payer: Self-pay | Admitting: Internal Medicine

## 2014-09-19 ENCOUNTER — Encounter: Payer: Self-pay | Admitting: Internal Medicine

## 2014-09-24 ENCOUNTER — Ambulatory Visit (INDEPENDENT_AMBULATORY_CARE_PROVIDER_SITE_OTHER): Payer: Medicare Other | Admitting: Pulmonary Disease

## 2014-09-24 ENCOUNTER — Encounter: Payer: Self-pay | Admitting: Pulmonary Disease

## 2014-09-24 VITALS — BP 126/64 | HR 67 | Ht 68.0 in | Wt 120.0 lb

## 2014-09-24 DIAGNOSIS — J479 Bronchiectasis, uncomplicated: Secondary | ICD-10-CM

## 2014-09-24 MED ORDER — LEVALBUTEROL TARTRATE 45 MCG/ACT IN AERO: 2.0000 | INHALATION_SPRAY | RESPIRATORY_TRACT | Status: DC | PRN

## 2014-09-24 NOTE — Patient Instructions (Signed)
Keep doing your mucociliary clearance measures regularly We will see you back in 4 months or sooner if needed

## 2014-09-24 NOTE — Progress Notes (Signed)
Subjective:    Patient ID: Hannah Kline, female    DOB: 1932/06/03, 80 y.o.   MRN: [id number] Synopsis: [name] established care with the [facility] pulmonary office in February 2013 for bronchiectasis due to [place]. She was diagnosed at age 77 in [place]. She was treated with rifampin ethambutol and clarithromycin for 2 years. Since then she does not believe she was ever grown out MAI.  She had c.diff colitis in 2015 which was refractory to treatment.    HPI  Chief Complaint  Patient presents with  . Follow-up    Pt c/o prod cough with beige mucus, Pt states she is eating better since last visit, having nausea.     09/24/2014 ROV > Hadlie needs a refill on her Xopenex solution so she needs a refill on that.  She says she has been doing OK and her breathing has been OK.  She just started doing her exercise again because she was feeling sick after a bunch of skin biopsies.  So far it has gone OK, she is doing aerobics and yoga about three days a week.  She would like a portable oxygen concentrator.  Her mucus keeps coming up daily, no blood.  Her appetite is up.  She had her flu shot.  Past Medical History  Diagnosis Date  . Atrial fibrillation   . History of Mycobacterium avium complex infection     lung disease  . COPD (chronic obstructive pulmonary disease)    . Bronchiectasis   . Hepatitis     h/o INH  . Bacterial infection due to Pseudomonas   . Pleurisy 12/08/11     Review of Systems  Constitutional: Positive for fatigue. Negative for fever, chills, appetite change and unexpected weight change.  HENT: Negative for congestion, ear pain and nosebleeds.   Respiratory: Positive for shortness of breath. Negative for cough and choking.   Cardiovascular: Negative for chest pain and leg swelling.       Objective:   Physical Exam Filed Vitals:   09/24/14 1108  BP: 126/64  Pulse: 67  Height: 5\' 8"  (1.727 m)  Weight: 120 lb (54.432 kg)  SpO2: 95%    room air   Gen: well appearing, no acute distress HEENT: NCAT, EOMi, OP clear,  PULM: clear to auscultation today with good air movement CV: Irreg irreg, no mgr, no JVD AB: BS+, soft, nontender, no hsm Ext: warm, no edema, no clubbing, no cyanosis  Review of 2012 sputum microbiology: Pan sensitive pseudomonas  09/2010 Full PFT: Ratio 63%, FEV1 1.49L (67% pred)  10/2011 CT Chest: impressive upper lobe cystic bronchiectasis, scattered lower lobe tree-in-bud abnormalities and scattered nodules; One RML solid nodule measures 7.63mm  January 2013 CT chest [facility]: Stable upper lobe cystic bronchiectasis also present in the right middle lobe. Scattered tree in bud abnormalities and nodules throughout all lung fields roughly unchanged from prior. Right middle lobe nodule is no longer visible but there is an 67mm nodule in the right upper lobe.  December 2013 simple spirometry FEV1 0.8 L  10/11/2012 Sputum: PSEUDOMONAS AERUGINOSA       Antibiotic  Sensitivity  Microscan  Status    CEFEPIME  Sensitive  2  Final    CEFTAZIDIME  Sensitive  <=1  Final    CIPROFLOXACIN  Intermediate  2  Final    GENTAMICIN  Sensitive  <=1  Final    IMIPENEM  Sensitive  1  Final    LEVOFLOXACIN  Intermediate  4  Final    PIP/TAZO  Sensitive  <=4  Final    TOBRAMYCIN  Sensitive  <=1  Final    January 2014 simple spirometry >> not reproducible by ATS standards  January 2014 Sputum AFB > neg x3 11/2013 FEV1 0.93 L  2015 Sputum> Pseudomonas , Cipro/Levofloxacin Intermediate suscept; Cefepime,Ceftaz, Imi, Pip-Tazo, Gent all susceptible  September 2015 sputum culture> Klebsiella resistant to amoxicillin but susceptible to Augmentin and multiple other agents     Assessment & Plan:   BRONCHIECTASIS This has been a stable interval for [place]. Fortunately she has not had a flare of her bronchiectasis since the last visit.  Today she and I had a long conversation about her bronchiectasis this winter. I would only  treat her with antibiotics if she had multiple concomitant symptoms including fever, weight loss, increased shortness of breath, and increased burden of mucus. If she only had one of these symptoms then I would be hesitant to treat with anything other than increased mucociliary clearance measures.  Plan: -Continue current therapy -Continue mucociliary clearance it least 2 times a day, would escalate to 3 times a day if she has increased respiratory symptoms as outlined above -If she does need antibiotics, which I would use as a last resort) then we may be able to get by with Augmentin as her most recent sputum culture showed Klebsiella which was fairly friendly.    Updated Medication List Outpatient Encounter Prescriptions as of 09/24/2014  Medication Sig  . dabigatran (PRADAXA) 150 MG CAPS capsule Take 150 mg by mouth as needed.   . dorzolamide-timolol (COSOPT) 22.3-6.8 MG/ML ophthalmic solution Place 1 drop into the left eye every 12 (twelve) hours.   . flecainide (TAMBOCOR) 50 MG tablet Take 50 mg by mouth daily.   . Fluticasone-Salmeterol (ADVAIR) 250-50 MCG/DOSE AEPB Inhale 1 puff into the lungs 2 (two) times daily.  Marland Kitchen latanoprost (XALATAN) 0.005 % ophthalmic solution Place 1 drop into the left eye At bedtime.  . levalbuterol (XOPENEX HFA) 45 MCG/ACT inhaler Inhale 2 puffs into the lungs every 4 (four) hours as needed.  Marland Kitchen levothyroxine (SYNTHROID, LEVOTHROID) 75 MCG tablet TAKE 1 TABLET BY MOUTH DAILY.  . mometasone (NASONEX) 50 MCG/ACT nasal spray Place 2 sprays into the nose daily as needed.  . ondansetron (ZOFRAN) 4 MG tablet Take 1 tablet (4 mg total) by mouth every 8 (eight) hours as needed for nausea or vomiting.  Marland Kitchen OVER THE COUNTER MEDICATION VST 3- probiotic 1 capsule daily.  . [DISCONTINUED] digoxin (LANOXIN) 0.25 MG tablet Take 1 tablet (0.25 mg total) by mouth daily as needed.  . [DISCONTINUED] megestrol (MEGACE) 20 MG tablet Take 1 tablet (20 mg total) by mouth 2 (two) times  daily.  . [DISCONTINUED] vancomycin (VANCOCIN) 125 MG capsule One capsule by mouth four times daily for 14 days,  then twice daily for 7 days,  Then daily for 7 days

## 2014-09-24 NOTE — Assessment & Plan Note (Signed)
This has been a stable interval for Hannah Kline. Fortunately she has not had a flare of her bronchiectasis since the last visit.  Today she and I had a long conversation about her bronchiectasis this winter. I would only treat her with antibiotics if she had multiple concomitant symptoms including fever, weight loss, increased shortness of breath, and increased burden of mucus. If she only had one of these symptoms then I would be hesitant to treat with anything other than increased mucociliary clearance measures.  Plan: -Continue current therapy -Continue mucociliary clearance it least 2 times a day, would escalate to 3 times a day if she has increased respiratory symptoms as outlined above -If she does need antibiotics, which I would use as a last resort) then we may be able to get by with Augmentin as her most recent sputum culture showed Klebsiella which was fairly friendly.

## 2014-10-09 DIAGNOSIS — R21 Rash and other nonspecific skin eruption: Secondary | ICD-10-CM | POA: Diagnosis not present

## 2014-10-09 DIAGNOSIS — L239 Allergic contact dermatitis, unspecified cause: Secondary | ICD-10-CM | POA: Diagnosis not present

## 2014-10-09 DIAGNOSIS — R202 Paresthesia of skin: Secondary | ICD-10-CM | POA: Diagnosis not present

## 2014-10-09 DIAGNOSIS — L0390 Cellulitis, unspecified: Secondary | ICD-10-CM | POA: Diagnosis not present

## 2014-10-11 ENCOUNTER — Other Ambulatory Visit: Payer: Self-pay | Admitting: Cardiovascular Disease

## 2014-10-14 DIAGNOSIS — R21 Rash and other nonspecific skin eruption: Secondary | ICD-10-CM | POA: Diagnosis not present

## 2014-10-14 DIAGNOSIS — L0390 Cellulitis, unspecified: Secondary | ICD-10-CM | POA: Diagnosis not present

## 2014-10-14 DIAGNOSIS — R203 Hyperesthesia: Secondary | ICD-10-CM | POA: Diagnosis not present

## 2014-10-14 DIAGNOSIS — L239 Allergic contact dermatitis, unspecified cause: Secondary | ICD-10-CM | POA: Diagnosis not present

## 2014-10-21 DIAGNOSIS — L03115 Cellulitis of right lower limb: Secondary | ICD-10-CM | POA: Diagnosis not present

## 2014-10-21 DIAGNOSIS — D485 Neoplasm of uncertain behavior of skin: Secondary | ICD-10-CM | POA: Diagnosis not present

## 2014-10-21 DIAGNOSIS — R234 Changes in skin texture: Secondary | ICD-10-CM | POA: Diagnosis not present

## 2014-10-21 DIAGNOSIS — C44722 Squamous cell carcinoma of skin of right lower limb, including hip: Secondary | ICD-10-CM | POA: Diagnosis not present

## 2014-10-28 DIAGNOSIS — C44722 Squamous cell carcinoma of skin of right lower limb, including hip: Secondary | ICD-10-CM | POA: Diagnosis not present

## 2014-10-28 DIAGNOSIS — L98491 Non-pressure chronic ulcer of skin of other sites limited to breakdown of skin: Secondary | ICD-10-CM | POA: Diagnosis not present

## 2014-10-28 DIAGNOSIS — L720 Epidermal cyst: Secondary | ICD-10-CM | POA: Diagnosis not present

## 2014-11-22 ENCOUNTER — Telehealth: Payer: Self-pay | Admitting: Internal Medicine

## 2014-11-22 ENCOUNTER — Ambulatory Visit (INDEPENDENT_AMBULATORY_CARE_PROVIDER_SITE_OTHER): Payer: Medicare Other | Admitting: Internal Medicine

## 2014-11-22 ENCOUNTER — Encounter: Payer: Self-pay | Admitting: Internal Medicine

## 2014-11-22 VITALS — BP 120/60 | HR 71 | Temp 98.1°F | Resp 16 | Ht 68.0 in | Wt 122.5 lb

## 2014-11-22 DIAGNOSIS — R197 Diarrhea, unspecified: Secondary | ICD-10-CM

## 2014-11-22 DIAGNOSIS — R739 Hyperglycemia, unspecified: Secondary | ICD-10-CM | POA: Diagnosis not present

## 2014-11-22 DIAGNOSIS — R5381 Other malaise: Secondary | ICD-10-CM

## 2014-11-22 DIAGNOSIS — R3589 Other polyuria: Secondary | ICD-10-CM

## 2014-11-22 DIAGNOSIS — R5383 Other fatigue: Secondary | ICD-10-CM

## 2014-11-22 DIAGNOSIS — R7309 Other abnormal glucose: Secondary | ICD-10-CM

## 2014-11-22 DIAGNOSIS — R358 Other polyuria: Secondary | ICD-10-CM

## 2014-11-22 DIAGNOSIS — R7303 Prediabetes: Secondary | ICD-10-CM

## 2014-11-22 LAB — GLUCOSE, POCT (MANUAL RESULT ENTRY): POC Glucose: 155 mg/dl — AB (ref 70–99)

## 2014-11-22 NOTE — Patient Instructions (Signed)
You may be having low blood sugars, as a sign of type 2 early diabetes.   I am checking an A1c today   Try drinking Muscle Milk instead of Boost for your protein intake.  It has  20 g protein per serving  And is available in cases at [employer]  And in 4 packs at [facility]   Always combine protein with your carbohydrates  Use the Low glycemic index list for help choosing foods

## 2014-11-22 NOTE — Progress Notes (Signed)
Patient ID: Hannah Kline, female   DOB: 1932-05-02, 81 y.o.   MRN: [id number]   Patient Active Problem List   Diagnosis Date Noted  . Malaise and fatigue 11/24/2014  . Glucose intolerance (pre-diabetes) 11/24/2014  . Hypothyroidism 09/02/2014  . Anorexia 07/14/2014  . Protein-calorie malnutrition, mild 07/14/2014  . Abnormal mammogram 02/19/2014  . Osteoporosis 02/14/2014  . Visit for preventive health examination 01/08/2014  . Colitis, Clostridium difficile 11/23/2013  . Bronchiectasis with acute exacerbation 10/24/2013  . Rhinitis, nonallergic 10/10/2012  . Orthostatic hypotension 09/22/2012  . Hypoxemia requiring supplemental oxygen 02/21/2012  . Chronic sinusitis 01/03/2012  . Pulmonary nodule 01/03/2012  . Screening for colon cancer 10/05/2011  . Screening for breast cancer 10/05/2011  . Bacterial infection due to Pseudomonas   . ATRIAL FIBRILLATION 09/15/2010  . BRONCHIECTASIS 09/15/2010  . C O P D 09/15/2010    Subjective:  CC:   Chief Complaint  Patient presents with  . Diarrhea    loose stools, and formed stools, nausea   . Nausea    X 2 days.  . Fatigue    HPI:   Hannah Kline is a 81 y.o. female who presents for   Acute on chronic symptoms.  She is a chronically ill patient with history of recurrent c dificile colitis who presents with 2 day history of nausea,  And excessive weakness for the past few days .  She has urinary frequency without dysuria.  She continues to participate in exercise   almost daily and yesterday had difficulty finishing the classes despite using her usual supplemental 02.  She has been having occasional semiformed , loose stools , but not continually and has not lost any weight.  Diet reviewed:  Diet:  Toast and scrambled eggs and banana,  Lunch is a sandwich and soup Dinner: meat ,  Green veg and a starch   Postprandial cbg 155 in office at 3:21 pm  Lunch was at 12:30    Past Medical History  Diagnosis Date  . Atrial  fibrillation   . History of Mycobacterium avium complex infection     lung disease  . COPD (chronic obstructive pulmonary disease)    . Bronchiectasis   . Hepatitis     h/o INH  . Bacterial infection due to Pseudomonas   . Pleurisy 12/08/11    Past Surgical History  Procedure Laterality Date  . Shoulder arthroscopy      right       The following portions of the patient's history were reviewed and updated as appropriate: Allergies, current medications, and problem list.    Review of Systems:   Patient denies headache, fevers, malaise, unintentional weight loss, skin rash, eye pain, sinus congestion and sinus pain, sore throat, dysphagia,  hemoptysis , cough, dyspnea, wheezing, chest pain, palpitations, orthopnea, edema, abdominal pain, nausea, melena, diarrhea, constipation, flank pain, dysuria, hematuria, urinary  Frequency, nocturia, numbness, tingling, seizures,  Focal weakness, Loss of consciousness,  Tremor, insomnia, depression, anxiety, and suicidal ideation.     History   Social History  . Marital Status: Married    Spouse Name: N/A    Number of Children: 60  . Years of Education: N/A   Occupational History  . 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 Smoking status: Never Smoker   . Smokeless tobacco: Never Used  . Alcohol Use: 0.0 - 0.5 oz/week    0-1 drink(s) per week  . Drug Use: No  . Sexual Activity: Not on file  Other Topics Concern  . Not on file   Social History Narrative    Objective:  Filed Vitals:   11/22/14 1500  BP: 120/60  Pulse: 71  Temp: 98.1 F (36.7 C)  Resp: 16     General appearance: alert, cooperative and appears stated age Ears: normal TM's and external ear canals both ears Throat: lips, mucosa, and tongue normal; teeth and gums normal Neck: no adenopathy, no carotid bruit, supple, symmetrical, trachea midline and thyroid not enlarged, symmetric, no tenderness/mass/nodules Back: symmetric, no curvature.  ROM normal. No CVA tenderness. Lungs: clear to auscultation bilaterally Heart: regular rate and rhythm, S1, S2 normal, no murmur, click, rub or gallop Abdomen: soft, non-tender; bowel sounds normal; no masses,  no organomegaly Pulses: 2+ and symmetric Skin: Skin color, texture, turgor normal. No rashes or lesions Lymph nodes: Cervical, supraclavicular, and axillary nodes normal.  Assessment and Plan:  Malaise and fatigue She has no signs of anemia, thyroid disorder, heart failure, or UTI.  Pre diabetes is suggested by labs today.  Will recommend low glycemic index diet and if symtoms persist, will consider cardiology evaluation with Holter monitor and ECHO given her history of atrial fibrillation,     Glucose intolerance (pre-diabetes)  recommended a low glycemic index diet utilizing smaller more frequent meals to increase metabolism.  Lab Results  Component Value Date   HGBA1C 5.8* 11/22/2014        Updated Medication List Outpatient Encounter Prescriptions as of 11/22/2014  Medication Sig  . dabigatran (PRADAXA) 150 MG CAPS capsule Take 150 mg by mouth as needed.   . dorzolamide-timolol (COSOPT) 22.3-6.8 MG/ML ophthalmic solution Place 1 drop into the left eye every 12 (twelve) hours.   . flecainide (TAMBOCOR) 50 MG tablet TAKE 1 TABLET BY MOUTH TWICE A DAY  . Fluticasone-Salmeterol (ADVAIR) 250-50 MCG/DOSE AEPB Inhale 1 puff into the lungs 2 (two) times daily.  Marland Kitchen latanoprost (XALATAN) 0.005 % ophthalmic solution Place 1 drop into the left eye At bedtime.  . levalbuterol (XOPENEX HFA) 45 MCG/ACT inhaler Inhale 2 puffs into the lungs every 4 (four) hours as needed.  Marland Kitchen levothyroxine (SYNTHROID, LEVOTHROID) 75 MCG tablet TAKE 1 TABLET BY MOUTH DAILY.  . mometasone (NASONEX) 50 MCG/ACT nasal spray Place 2 sprays into the nose daily as needed.  . ondansetron (ZOFRAN) 4 MG tablet Take 1 tablet (4 mg total) by mouth every 8 (eight) hours as needed for nausea or vomiting.  Marland Kitchen OVER THE  COUNTER MEDICATION VST 3- probiotic 1 capsule daily.     Orders Placed This Encounter  Procedures  . Urinalysis, dipstick only  . Comp Met (CMET)  . Hemoglobin A1c  . TSH  . POCT Glucose (CBG)    No Follow-up on file.

## 2014-11-22 NOTE — Progress Notes (Signed)
Pre-visit discussion using our clinic review tool. No additional management support is needed unless otherwise documented below in the visit note.  

## 2014-11-22 NOTE — Telephone Encounter (Signed)
Patient Name: Hannah Kline  DOB: May 30, 1932    Nurse Assessment  Nurse: Mechele Dawley, RN, Amy Date/Time Eilene Ghazi Time): 11/22/2014 10:04:48 AM  Confirm and document reason for call. If symptomatic, describe symptoms. ---CALLER STATES THAT SHE NOTICED YESTERDAY MORNING THAT SHE FELT LIKE SHE WAS GOING TO FAINT. SHE STATES THAT SHE FEELS REALLY WEAK. SHE FEELS LIKE SHE HAS NO ENERGY, FEELS NAUSEATED. SHE WAS ABLE TO EAT BREAKFAST. SHE HAS BEEN HAVING SOME DIARRHEA FOR THE PAST 2 DAYS. SHE STATES SHE HAS BEEN HAVING REGULAR STOOL ALONG WITH A DIARRHEA EPISODE AS WELL. SHE WENT TO EXERCISE CLASS THIS MORNING AND SHE WAS UNABLE TO COMPLETE HER EXERCISE CLASS. SHE DID USE HER OXYGEN AND IT NORMALLY HELPS HER - BUT DID NOT SEEM TO HELP MUCH. SHE USUALLY ONLY HAS TO HAVE ASSIST WITH O2 WITH EXERTION. SHE STATES SHE HAS HX OF CDIFF.  Has the patient traveled out of the country within the last 30 days? ---Not Applicable  Does the patient require triage? ---Yes  Related visit to physician within the last 2 weeks? ---No  Does the PT have any chronic conditions? (i.e. diabetes, asthma, etc.) ---Yes  List chronic conditions. ---AFIB, LOW BLOOD PRESSURE, UNDER-WEIGHT DUE TO C-DIFF     Guidelines    Guideline Title Affirmed Question Affirmed Notes  Weakness (Generalized) and Fatigue [1] MODERATE weakness (i.e., interferes with work, school, normal activities) AND [2] persists > 3 days GENERAL WEAKNESS AND FEELING TIRED - WANTS TO REST   Final Disposition User   See Physician within 24 Hours [place], Therapist, sports, Amy    Comments  SCHEDULED PATIENT AN APPT TODAY AT 2:45 IN THE OFFICE WITH HER MD. CALLED THE PATIENT AND CONFIRMED THE TIME WITH HER. SHE WAS GLAD TO BE ABLE TO GET INTO THE MD TODAY. WILL CLOSE THIS CALL.

## 2014-11-23 LAB — COMPREHENSIVE METABOLIC PANEL WITH GFR
ALT: 12 U/L (ref 0–35)
AST: 17 U/L (ref 0–37)
Albumin: 3.7 g/dL (ref 3.5–5.2)
Alkaline Phosphatase: 81 U/L (ref 39–117)
BUN: 18 mg/dL (ref 6–23)
CO2: 27 meq/L (ref 19–32)
Calcium: 8.8 mg/dL (ref 8.4–10.5)
Chloride: 101 meq/L (ref 96–112)
Creat: 0.71 mg/dL (ref 0.50–1.10)
Glucose, Bld: 88 mg/dL (ref 70–99)
Potassium: 4.2 meq/L (ref 3.5–5.3)
Sodium: 138 meq/L (ref 135–145)
Total Bilirubin: 0.4 mg/dL (ref 0.2–1.2)
Total Protein: 7.1 g/dL (ref 6.0–8.3)

## 2014-11-23 LAB — URINALYSIS, DIPSTICK ONLY
Bilirubin, UA: NEGATIVE
Glucose, UA: NEGATIVE
Ketones, UA: NEGATIVE
Leukocytes, UA: NEGATIVE
Nitrite, UA: NEGATIVE
Protein, UA: NEGATIVE
RBC, UA: NEGATIVE
Specific Gravity, UA: 1.027 (ref 1.005–1.030)
Urobilinogen, Ur: 0.2 mg/dL (ref 0.2–1.0)
pH, UA: 5.5 (ref 5.0–7.5)

## 2014-11-23 LAB — HEMOGLOBIN A1C
HEMOGLOBIN A1C: 5.8 % — AB (ref <5.7)
Mean Plasma Glucose: 120 mg/dL — ABNORMAL HIGH (ref <117)

## 2014-11-23 LAB — TSH: TSH: 2.64 u[IU]/mL (ref 0.350–4.500)

## 2014-11-24 ENCOUNTER — Encounter: Payer: Self-pay | Admitting: Internal Medicine

## 2014-11-24 DIAGNOSIS — R7303 Prediabetes: Secondary | ICD-10-CM | POA: Insufficient documentation

## 2014-11-24 DIAGNOSIS — R5383 Other fatigue: Secondary | ICD-10-CM

## 2014-11-24 DIAGNOSIS — R5381 Other malaise: Secondary | ICD-10-CM | POA: Insufficient documentation

## 2014-11-24 NOTE — Assessment & Plan Note (Signed)
She has no signs of anemia, thyroid disorder, heart failure, or UTI.  Pre diabetes is suggested by labs today.  Will recommend low glycemic index diet and if symtoms persist, will consider cardiology evaluation

## 2014-11-24 NOTE — Assessment & Plan Note (Signed)
recommended a low glycemic index diet utilizing smaller more frequent meals to increase metabolism.  Lab Results  Component Value Date   HGBA1C 5.8* 11/22/2014

## 2014-11-27 ENCOUNTER — Ambulatory Visit (INDEPENDENT_AMBULATORY_CARE_PROVIDER_SITE_OTHER)
Admission: RE | Admit: 2014-11-27 | Discharge: 2014-11-27 | Disposition: A | Discharge status: Home / Self Care | Payer: Medicare Other | Source: Ambulatory Visit | Attending: Pulmonary Disease | Admitting: Pulmonary Disease

## 2014-11-27 ENCOUNTER — Ambulatory Visit (INDEPENDENT_AMBULATORY_CARE_PROVIDER_SITE_OTHER): Payer: Medicare Other | Admitting: Pulmonary Disease

## 2014-11-27 ENCOUNTER — Other Ambulatory Visit: Payer: Self-pay | Admitting: *Deleted

## 2014-11-27 ENCOUNTER — Encounter: Payer: Self-pay | Admitting: Pulmonary Disease

## 2014-11-27 VITALS — BP 116/64 | HR 73 | Temp 97.5°F | Ht 68.0 in | Wt 121.0 lb

## 2014-11-27 DIAGNOSIS — Z9981 Dependence on supplemental oxygen: Secondary | ICD-10-CM

## 2014-11-27 DIAGNOSIS — R05 Cough: Secondary | ICD-10-CM | POA: Diagnosis not present

## 2014-11-27 DIAGNOSIS — R0902 Hypoxemia: Secondary | ICD-10-CM | POA: Diagnosis not present

## 2014-11-27 DIAGNOSIS — J310 Chronic rhinitis: Secondary | ICD-10-CM | POA: Diagnosis not present

## 2014-11-27 DIAGNOSIS — R059 Cough, unspecified: Secondary | ICD-10-CM

## 2014-11-27 DIAGNOSIS — J479 Bronchiectasis, uncomplicated: Secondary | ICD-10-CM

## 2014-11-27 DIAGNOSIS — E039 Hypothyroidism, unspecified: Secondary | ICD-10-CM

## 2014-11-27 MED ORDER — LEVALBUTEROL HCL 1.25 MG/3ML IN NEBU: 1.2500 mg | INHALATION_SOLUTION | RESPIRATORY_TRACT | Status: DC | PRN

## 2014-11-27 MED ORDER — LEVALBUTEROL HCL 0.63 MG/3ML IN NEBU: 0.6300 mg | INHALATION_SOLUTION | Freq: Once | RESPIRATORY_TRACT | Status: DC

## 2014-11-27 MED ORDER — LEVOTHYROXINE SODIUM 75 MCG PO TABS: ORAL_TABLET | ORAL | Status: DC

## 2014-11-27 NOTE — Progress Notes (Signed)
Subjective:    Patient ID: Hannah Kline, female    DOB: 31-Jul-1932, 81 y.o.   MRN: [id number] Synopsis: Hannah Kline established care with the [facility] pulmonary office in February 2013 for bronchiectasis due to [place]. She was diagnosed at age 42 in [place]. She was treated with rifampin ethambutol and clarithromycin for 2 years. Since then she does not believe she was ever grown out MAI.  She had c.diff colitis in 2015 which was refractory to treatment.    HPI  Chief Complaint  Patient presents with  . Follow-up    Pt c/o intermittent nausea, fatigue.  Had improved for a while but worsened the past week.  also c/o prod cough with yellow mucus.  has been using 02 more.  Never received xopenex through [facility].    Hannah has been feeling weaker lately and is nauseated more.  She has been coughing more, mostly it is dry.   She notes: -nasuea post eating -Sinus prolems (congestion, drainage) -These symptoms have been persistent since thanksgiving She has no fevers or chills She has a dry cough > makes her want to throw up She has not been using her nasal steroid or saline rinses   Past Medical History  Diagnosis Date  . Atrial fibrillation   . History of Mycobacterium avium complex infection     lung disease  . COPD (chronic obstructive pulmonary disease)    . Bronchiectasis   . Hepatitis     h/o INH  . Bacterial infection due to Pseudomonas   . Pleurisy 12/08/11     Review of Systems  Constitutional: Positive for fatigue. Negative for fever, chills, appetite change and unexpected weight change.  HENT: Negative for congestion, ear pain and nosebleeds.   Respiratory: Positive for cough and shortness of breath. Negative for choking.   Cardiovascular: Negative for chest pain and leg swelling.       Objective:   Physical Exam Filed Vitals:   11/27/14 1444  BP: 116/64  Pulse: 73  Temp: 97.5 F (36.4 C)  TempSrc: Oral  Height: 5\' 8"   (1.727 m)  Weight: 121 lb (54.885 kg)  SpO2: 95%   room air   Gen: well appearing, no acute distress HEENT: NCAT, EOMi, OP clear,  PULM: clear to auscultation today with good air movement CV: Irreg irreg, no mgr, no JVD AB: BS+, soft, nontender, no hsm Ext: warm, no edema, no clubbing, no cyanosis  Review of 2012 sputum microbiology: Pan sensitive pseudomonas  09/2010 Full PFT: Ratio 63%, FEV1 1.49L (67% pred)  10/2011 CT Chest: impressive upper lobe cystic bronchiectasis, scattered lower lobe tree-in-bud abnormalities and scattered nodules; One RML solid nodule measures 7.18mm  January 2013 CT chest [facility]: Stable upper lobe cystic bronchiectasis also present in the right middle lobe. Scattered tree in bud abnormalities and nodules throughout all lung fields roughly unchanged from prior. Right middle lobe nodule is no longer visible but there is an 39mm nodule in the right upper lobe.  December 2013 simple spirometry FEV1 0.8 L  10/11/2012 Sputum: PSEUDOMONAS AERUGINOSA       Antibiotic  Sensitivity  Microscan  Status    CEFEPIME  Sensitive  2  Final    CEFTAZIDIME  Sensitive  <=1  Final    CIPROFLOXACIN  Intermediate  2  Final    GENTAMICIN  Sensitive  <=1  Final    IMIPENEM  Sensitive  1  Final    LEVOFLOXACIN  Intermediate  4  Final  PIP/TAZO  Sensitive  <=4  Final    TOBRAMYCIN  Sensitive  <=1  Final    January 2014 simple spirometry >> not reproducible by ATS standards  January 2014 Sputum AFB > neg x3 11/2013 FEV1 0.93 L  2015 Sputum> Pseudomonas , Cipro/Levofloxacin Intermediate suscept; Cefepime,Ceftaz, Imi, Pip-Tazo, Gent all susceptible  September 2015 sputum culture> Klebsiella resistant to amoxicillin but susceptible to Augmentin and multiple other agents     Assessment & Plan:   Hypoxemia requiring supplemental oxygen Continue 2L with exertion   Rhinitis, nonallergic She has not been keeping up with her pulmonary toilet or sinus toilet regimen recently.  Therefore her sinus disease has been worse. She has not also not been using nasal steroid.  Plan: -Resume Milta Deiters med rinses twice a day -Advised on appropriate use of nasal corticosteroids   BRONCHIECTASIS She has severe airflow obstruction from her bronchiectasis.  Lately she has been feeling more fatigue as well as a slight increase in cough. Because bronchiectasis is a chronic infection and chronic inflammatory state I explained to her that it's possible she may be experiencing a mild flare of bronchiectasis though without a change in purulent sputum production it's difficult to say that this is the case right now.  Because she has had some attached trouble with C. difficile in the past I am reluctant to treat her with antibiotics at this time.  Plan: -CXR -I have advised that she start back on a regular pulmonary toilet regimen for mucociliary clearance -Specifically, she was advised to exercise daily and perform postural drainage daily -Add nebulized bronchodilator with Xopenx  -continue Advair -If no improvement in 14 days then we will consider a round of amoxicillin     Updated Medication List Outpatient Encounter Prescriptions as of 11/27/2014  Medication Sig  . dabigatran (PRADAXA) 150 MG CAPS capsule Take 150 mg by mouth as needed.   . dorzolamide-timolol (COSOPT) 22.3-6.8 MG/ML ophthalmic solution Place 1 drop into the left eye every 12 (twelve) hours.   . flecainide (TAMBOCOR) 50 MG tablet TAKE 1 TABLET BY MOUTH TWICE A DAY  . Fluticasone-Salmeterol (ADVAIR) 250-50 MCG/DOSE AEPB Inhale 1 puff into the lungs 2 (two) times daily.  Marland Kitchen latanoprost (XALATAN) 0.005 % ophthalmic solution Place 1 drop into the left eye At bedtime.  Marland Kitchen levothyroxine (SYNTHROID, LEVOTHROID) 75 MCG tablet TAKE 1 TABLET BY MOUTH DAILY.  . mometasone (NASONEX) 50 MCG/ACT nasal spray Place 2 sprays into the nose daily as needed.  . ondansetron (ZOFRAN) 4 MG tablet Take 1 tablet (4 mg total) by mouth every 8  (eight) hours as needed for nausea or vomiting.  Marland Kitchen OVER THE COUNTER MEDICATION VST 3- probiotic 1 capsule daily.  Marland Kitchen levalbuterol (XOPENEX HFA) 45 MCG/ACT inhaler Inhale 2 puffs into the lungs every 4 (four) hours as needed. (Patient not taking: Reported on 11/27/2014)  . levalbuterol (XOPENEX) 1.25 MG/3ML nebulizer solution Take 1.25 mg by nebulization every 4 (four) hours as needed for wheezing.  . [DISCONTINUED] levalbuterol (XOPENEX) 0.63 MG/3ML nebulizer solution Take 3 mLs (0.63 mg total) by nebulization once.  . [DISCONTINUED] levalbuterol (XOPENEX) 1.25 MG/3ML nebulizer solution Take 1.25 mg by nebulization every 4 (four) hours as needed for wheezing.  . [DISCONTINUED] levothyroxine (SYNTHROID, LEVOTHROID) 75 MCG tablet TAKE 1 TABLET BY MOUTH DAILY.

## 2014-11-27 NOTE — Patient Instructions (Addendum)
For the sinuses: Use Neil Med rinses with distilled water at least twice per day using the instructions on the package. 1/2 hour after using the [facility] Med rinse, use Nasacort two puffs in each nostril once per day.  Remember that the Nasacort can take 1-2 weeks to work after regular use.  For the chest: Exercise daily Perform postural drainage daily Use Xopenex nebulized twice a day Keep taking your Advair  Eat more in the next 7-14 days  If you are not feeling better in 14 days call me and we will call in an antibiotic  We will see you back in 3 months or sooner if needed

## 2014-11-27 NOTE — Assessment & Plan Note (Signed)
Continue 2L with exertion

## 2014-11-27 NOTE — Assessment & Plan Note (Addendum)
She has severe airflow obstruction from her bronchiectasis.  Lately she has been feeling more fatigue as well as a slight increase in cough. Because bronchiectasis is a chronic infection and chronic inflammatory state I explained to her that it's possible she may be experiencing a mild flare of bronchiectasis though without a change in purulent sputum production it's difficult to say that this is the case right now.  Because she has had some attached trouble with C. difficile in the past I am reluctant to treat her with antibiotics at this time.  Plan: -CXR -I have advised that she start back on a regular pulmonary toilet regimen for mucociliary clearance -Specifically, she was advised to exercise daily and perform postural drainage daily -Add nebulized bronchodilator with Xopenx  -continue Advair -If no improvement in 14 days then we will consider a round of amoxicillin

## 2014-11-27 NOTE — Assessment & Plan Note (Signed)
She has not been keeping up with her pulmonary toilet or sinus toilet regimen recently. Therefore her sinus disease has been worse. She has not also not been using nasal steroid.  Plan: -Resume Milta Deiters med rinses twice a day -Advised on appropriate use of nasal corticosteroids

## 2014-11-28 ENCOUNTER — Telehealth: Payer: Self-pay

## 2014-11-28 ENCOUNTER — Other Ambulatory Visit: Payer: Self-pay

## 2014-11-28 DIAGNOSIS — C44722 Squamous cell carcinoma of skin of right lower limb, including hip: Secondary | ICD-10-CM | POA: Diagnosis not present

## 2014-11-28 DIAGNOSIS — D485 Neoplasm of uncertain behavior of skin: Secondary | ICD-10-CM | POA: Diagnosis not present

## 2014-11-28 DIAGNOSIS — J479 Bronchiectasis, uncomplicated: Secondary | ICD-10-CM

## 2014-11-28 DIAGNOSIS — C4442 Squamous cell carcinoma of skin of scalp and neck: Secondary | ICD-10-CM | POA: Diagnosis not present

## 2014-11-28 MED ORDER — LEVALBUTEROL HCL 1.25 MG/3ML IN NEBU: 1.2500 mg | INHALATION_SOLUTION | RESPIRATORY_TRACT | Status: DC | PRN

## 2014-11-28 NOTE — Telephone Encounter (Signed)
Spoke with pt, she is aware of results and recs.  AFB orders placed at [facility].  She has been advised to pick up 3 sample cups from [facility] today.  Nothing further needed.

## 2014-11-28 NOTE — Progress Notes (Signed)
Quick Note:  Pt aware. Labs ordered. Nothing further needed. ______

## 2014-11-28 NOTE — Telephone Encounter (Signed)
-----   Message from Juanito Doom, MD sent at 11/28/2014  5:47 AM EST ----- A, Please let her know that the radiologist thought she may have early signs of pneumonia.  I have looked at the images and I don't think that is the case.  I would like for her to stick to our plan, but I want to test her mucus for MAC (or MAI, same thing) again, so please order three sputum AFB. Thanks B

## 2014-12-03 ENCOUNTER — Other Ambulatory Visit: Payer: Medicare Other | Admitting: *Deleted

## 2014-12-03 DIAGNOSIS — J479 Bronchiectasis, uncomplicated: Secondary | ICD-10-CM | POA: Diagnosis not present

## 2014-12-09 ENCOUNTER — Other Ambulatory Visit: Payer: Self-pay | Admitting: *Deleted

## 2014-12-09 DIAGNOSIS — J479 Bronchiectasis, uncomplicated: Secondary | ICD-10-CM

## 2014-12-11 DIAGNOSIS — C4442 Squamous cell carcinoma of skin of scalp and neck: Secondary | ICD-10-CM | POA: Diagnosis not present

## 2014-12-12 ENCOUNTER — Telehealth: Payer: Self-pay | Admitting: *Deleted

## 2014-12-12 ENCOUNTER — Other Ambulatory Visit: Payer: Self-pay | Admitting: *Deleted

## 2014-12-12 ENCOUNTER — Telehealth: Payer: Self-pay | Admitting: Pulmonary Disease

## 2014-12-12 DIAGNOSIS — J479 Bronchiectasis, uncomplicated: Secondary | ICD-10-CM | POA: Diagnosis not present

## 2014-12-12 NOTE — Telephone Encounter (Signed)
Pt is calling stating she is currently in AFIB  Since then she has taken the following:  Taken paradoxa 150 mg der Gollan gave samples of this Just took one Also took samples bystolic 5 mg and only took one so far And digoxin250 micrograms and this is an old rx and  it has expired a few months ago.but still took one.   Took Flecainide and for a long time its kept her from going into Afib.  Now patient is asking how often should she take those med's that she has on hand.   she's been like this for about an hour and heart beat is jumping 75-130, mostly at 110.   Please advise.

## 2014-12-12 NOTE — Telephone Encounter (Signed)
Would take an additional digoxin pradaxa a.m. and p.m., twice a day Would take extra flecainide now and again before bed Tomorrow would take pradaxa twice a day Flecainide 100 mg in the morning 1 digoxin tomorrow  Would call us again in the morning if she is not in normal sinus rhythm

## 2014-12-12 NOTE — Telephone Encounter (Signed)
Spoke w/ pt.  Advised her of Dr. Donivan Scull recommendation.  She verbalizes understanding and will call back in the morning to let me know how she is doing.

## 2014-12-12 NOTE — Telephone Encounter (Signed)
Called pt. She reports she was told on 11/27/14 OV to use xopenex neb BID. She has been doing this and is feeling better. She is almost out but she wants to know if she needs to go back to this as an as needed basis or continue BID. If we want her to use this BID then she wants new RX sent in stating this. Pt wants further clarification. Please advise Dr. Lake Bells as pt is not to f/u until 3 months. thanks

## 2014-12-12 NOTE — Telephone Encounter (Signed)
OK to use BID, OK to refill for 3 months

## 2014-12-13 NOTE — Telephone Encounter (Signed)
Spoke with pt, she is aware of BQ's recs.  She still has refills on her current rx so a new refill is not needed.  Nothing further needed.

## 2014-12-24 DIAGNOSIS — C44612 Basal cell carcinoma of skin of right upper limb, including shoulder: Secondary | ICD-10-CM | POA: Diagnosis not present

## 2015-01-08 ENCOUNTER — Telehealth: Payer: Self-pay | Admitting: Pulmonary Disease

## 2015-01-08 NOTE — Telephone Encounter (Signed)
yes

## 2015-01-08 NOTE — Telephone Encounter (Signed)
[id number] x1 

## 2015-01-08 NOTE — Telephone Encounter (Signed)
Spoke with pt, states she is ordering a portable 02 machine- it's called [employer] One.  She is paying for it out of pocket as Medicare does not cover it.  Pt wants to make sure that BQ is ok with this.   BQ please advise if you have any objections to pt purchasing this.  Thanks!

## 2015-01-09 NOTE — Telephone Encounter (Signed)
Pt returned call - [phone]

## 2015-01-09 NOTE — Telephone Encounter (Signed)
Spoke with the pt and advised ok per BQ for portable system through [employer]

## 2015-01-14 DIAGNOSIS — L820 Inflamed seborrheic keratosis: Secondary | ICD-10-CM | POA: Diagnosis not present

## 2015-01-14 DIAGNOSIS — L280 Lichen simplex chronicus: Secondary | ICD-10-CM | POA: Diagnosis not present

## 2015-01-14 DIAGNOSIS — R21 Rash and other nonspecific skin eruption: Secondary | ICD-10-CM | POA: Diagnosis not present

## 2015-01-14 DIAGNOSIS — Z85820 Personal history of malignant melanoma of skin: Secondary | ICD-10-CM | POA: Diagnosis not present

## 2015-01-14 DIAGNOSIS — C44722 Squamous cell carcinoma of skin of right lower limb, including hip: Secondary | ICD-10-CM | POA: Diagnosis not present

## 2015-01-15 LAB — AFB CULTURE WITH SMEAR (NOT AT ARMC): Acid Fast Smear: NONE SEEN

## 2015-01-21 DIAGNOSIS — H4011X2 Primary open-angle glaucoma, moderate stage: Secondary | ICD-10-CM | POA: Diagnosis not present

## 2015-01-21 LAB — AFB CULTURE WITH SMEAR (NOT AT ARMC): Acid Fast Smear: NONE SEEN

## 2015-01-24 LAB — AFB CULTURE WITH SMEAR (NOT AT ARMC): Acid Fast Smear: NONE SEEN

## 2015-01-28 ENCOUNTER — Ambulatory Visit: Payer: Self-pay | Admitting: Pulmonary Disease

## 2015-01-28 ENCOUNTER — Encounter: Payer: Self-pay | Admitting: Pulmonary Disease

## 2015-01-28 ENCOUNTER — Ambulatory Visit (INDEPENDENT_AMBULATORY_CARE_PROVIDER_SITE_OTHER): Payer: Medicare Other | Admitting: Pulmonary Disease

## 2015-01-28 VITALS — BP 122/60 | HR 73 | Ht 68.0 in | Wt 121.0 lb

## 2015-01-28 DIAGNOSIS — Z9981 Dependence on supplemental oxygen: Secondary | ICD-10-CM

## 2015-01-28 DIAGNOSIS — R0602 Shortness of breath: Secondary | ICD-10-CM | POA: Diagnosis not present

## 2015-01-28 DIAGNOSIS — J479 Bronchiectasis, uncomplicated: Secondary | ICD-10-CM

## 2015-01-28 DIAGNOSIS — R0902 Hypoxemia: Secondary | ICD-10-CM | POA: Diagnosis not present

## 2015-01-28 DIAGNOSIS — J849 Interstitial pulmonary disease, unspecified: Secondary | ICD-10-CM | POA: Diagnosis not present

## 2015-01-28 MED ORDER — BUDESONIDE 0.25 MG/2ML IN SUSP: 0.5000 mg | Freq: Two times a day (BID) | RESPIRATORY_TRACT | Status: DC

## 2015-01-28 MED ORDER — PREDNISONE 20 MG PO TABS: 20.0000 mg | ORAL_TABLET | Freq: Every day | ORAL | Status: DC

## 2015-01-28 MED ORDER — ARFORMOTEROL TARTRATE 15 MCG/2ML IN NEBU: 15.0000 ug | INHALATION_SOLUTION | Freq: Two times a day (BID) | RESPIRATORY_TRACT | Status: DC

## 2015-01-28 NOTE — Patient Instructions (Addendum)
We will arrange a chest x-ray and call you with the results Change from Advair to Pulmicort and Garlon Hatchet We will arrange an overnight oximetry test Walk every day Take the prednisone for 5 days We will see you back in 2 months or sooner if needed

## 2015-01-28 NOTE — Assessment & Plan Note (Addendum)
Hannah Kline has suffered with severe bronchiectasis for many years and as of late she has been more short of breath. This is likely due to her severe airflow obstruction secondary to aging and her advanced bronchiectasis. She is quite frustrated with this diagnosis. We tested her in January and we did not find evidence of an ongoing AFB infection from sputum culture testing.  I explained to her in clinic today that I do not think she has had an exacerbation of bronchiectasis, but I do not think that she is adequately performing mucociliary clearance measures or using her medications appropriately to try to best manage this disease. There is very little that I have to offer to change her long-term, with the exception of strict adherence to her medications and making efforts towards mucociliary clearance on a daily basis.  She is wheezing slightly on exam today on the left more than the right which may represent a mild exacerbation of bronchiectasis and COPD. I am reluctant to use antibiotics considering all of her problems with C. difficile in the past. Should she develop fever, weight loss, or increasing mucopurulence or hemoptysis then we would consider using an antibiotic.  Plan: -Short prednisone burst for increased wheezing lately. -We will repeat a chest x-ray because one in January showed possible pneumonia, I think this is likely just mucus plugging -We will change Advair to Brovana and Pulmicort because she seems to do better with nebulized medications -Encouraged at length to use mucociliary clearance measures on a daily basis next line-encouraged at length to ambulate daily -Continue using albuterol at least twice a day as needed -Follow-up 2 months

## 2015-01-28 NOTE — Progress Notes (Signed)
Subjective:    Patient ID: Hannah Kline, female    DOB: 1932-01-22, 81 y.o.   MRN: [id number] Synopsis: [name] established care with the [facility] pulmonary office in February 2013 for bronchiectasis due to [place]. She was diagnosed at age 28 in [place]. She was treated with rifampin ethambutol and clarithromycin for 2 years. Since then she does not believe she was ever grown out MAI.  She had c.diff colitis in 2015 which was refractory to treatment.    HPI  Chief Complaint  Patient presents with  . Follow-up    Pt last seen 11/27/14 sob,cough. Pt states no change in sob. Pt uses 2L on reg tank at home as needed and 3L on portable as needed.   [name] says that she has been using her oxygen at 3L when she exerts herself 2L at rest.  She says that she is having more trouble breathing and has slept with oxygen sometimes at night.  She says that she feels that her breathing is worse in that with just minimal activity she is getting more short of breath.  She really likes the Inogen device but she would like to try get it through Medicare if she can.  She denies fevers, chills. She notes no weight loss at home. She is planning to travel to [place] in the next few weeks for her granddaughter's graduation. She really likes the new oxygen concentrator. She continues to use the Advair just once a day instead of twice a day. She is not exercising routinely with the exception of yoga about 3 times a week. She says that she is bring up more yellow, beige. She coughs up mucus every day and she is using the nebulizer twice a day which helps with the mucus production.  She is not doing intentional mucuciliary clearance.   Past Medical History  Diagnosis Date  . Atrial fibrillation   . History of Mycobacterium avium complex infection     lung disease  . COPD (chronic obstructive pulmonary disease)    . Bronchiectasis   . Hepatitis     h/o INH  . Bacterial infection  due to Pseudomonas   . Pleurisy 12/08/11     Review of Systems  Constitutional: Positive for fatigue. Negative for fever, chills, appetite change and unexpected weight change.  HENT: Negative for congestion, ear pain and nosebleeds.   Respiratory: Positive for cough and shortness of breath. Negative for choking.   Cardiovascular: Negative for chest pain and leg swelling.       Objective:   Physical Exam Filed Vitals:   01/28/15 1010  BP: 122/60  Pulse: 73  Height: 5\' 8"  (1.727 m)  Weight: 121 lb (54.885 kg)  SpO2: 99%   2L   Gen: well appearing, no acute distress HEENT: NCAT, EOMi, OP clear,  PULM: clear to auscultation today with good air movement CV: Irreg irreg, no mgr, no JVD AB: BS+, soft, nontender, no hsm Ext: warm, no edema, no clubbing, no cyanosis  Review of 2012 sputum microbiology: Pan sensitive pseudomonas  09/2010 Full PFT: Ratio 63%, FEV1 1.49L (67% pred)  10/2011 CT Chest: impressive upper lobe cystic bronchiectasis, scattered lower lobe tree-in-bud abnormalities and scattered nodules; One RML solid nodule measures 7.85mm  January 2013 CT chest [facility]: Stable upper lobe cystic bronchiectasis also present in the right middle lobe. Scattered tree in bud abnormalities and nodules throughout all lung fields roughly unchanged from prior. Right middle lobe nodule is no longer visible but  there is an 38mm nodule in the right upper lobe.  December 2013 simple spirometry FEV1 0.8 L  10/11/2012 Sputum: PSEUDOMONAS AERUGINOSA       Antibiotic  Sensitivity  Microscan  Status    CEFEPIME  Sensitive  2  Final    CEFTAZIDIME  Sensitive  <=1  Final    CIPROFLOXACIN  Intermediate  2  Final    GENTAMICIN  Sensitive  <=1  Final    IMIPENEM  Sensitive  1  Final    LEVOFLOXACIN  Intermediate  4  Final    PIP/TAZO  Sensitive  <=4  Final    TOBRAMYCIN  Sensitive  <=1  Final    January 2014 simple spirometry >> not reproducible by ATS standards  January 2014 Sputum AFB >  neg x3 11/2013 FEV1 0.93 L  2015 Sputum> Pseudomonas , Cipro/Levofloxacin Intermediate suscept; Cefepime,Ceftaz, Imi, Pip-Tazo, Gent all susceptible  September 2015 sputum culture> Klebsiella resistant to amoxicillin but susceptible to Augmentin and multiple other agents     Assessment & Plan:   BRONCHIECTASIS Hannah Kline has suffered with severe bronchiectasis for many years and as of late she has been more short of breath. This is likely due to her severe airflow obstruction secondary to aging and her advanced bronchiectasis. She is quite frustrated with this diagnosis. We tested her in January and we did not find evidence of an ongoing AFB infection from sputum culture testing.  I explained to her in clinic today that I do not think she has had an exacerbation of bronchiectasis, but I do not think that she is adequately performing mucociliary clearance measures or using her medications appropriately to try to best manage this disease. There is very little that I have to offer to change her long-term, with the exception of strict adherence to her medications and making efforts towards mucociliary clearance on a daily basis.  She is wheezing slightly on exam today on the left more than the right which may represent a mild exacerbation of bronchiectasis and COPD. I am reluctant to use antibiotics considering all of her problems with C. difficile in the past. Should she develop fever, weight loss, or increasing mucopurulence or hemoptysis then we would consider using an antibiotic.  Plan: -Short prednisone burst for increased wheezing lately. -We will repeat a chest x-ray because one in January showed possible pneumonia, I think this is likely just mucus plugging -We will change Advair to Brovana and Pulmicort because she seems to do better with nebulized medications -Encouraged at length to use mucociliary clearance measures on a daily basis next line-encouraged at length to ambulate daily -Continue  using albuterol at least twice a day as needed -Follow-up 2 months   C O P D This is severe. She is not interested in taking Spiriva.  Plan: -As noted above and bronchiectasis   Hypoxemia requiring supplemental oxygen There has been no change in her recent ambulatory oximetry measurements. We will obtain an overnight oximetry test because she says she has been having to sleep with oxygen at night.     Updated Medication List Outpatient Encounter Prescriptions as of 01/28/2015  Medication Sig  . dabigatran (PRADAXA) 150 MG CAPS capsule Take 150 mg by mouth as needed.   . dorzolamide-timolol (COSOPT) 22.3-6.8 MG/ML ophthalmic solution Place 1 drop into the left eye every 12 (twelve) hours.   . flecainide (TAMBOCOR) 50 MG tablet TAKE 1 TABLET BY MOUTH TWICE A DAY (Patient taking differently: qd)  . Fluticasone-Salmeterol (ADVAIR) 250-50 MCG/DOSE  AEPB Inhale 1 puff into the lungs 2 (two) times daily.  Marland Kitchen latanoprost (XALATAN) 0.005 % ophthalmic solution Place 1 drop into the left eye At bedtime.  . levalbuterol (XOPENEX HFA) 45 MCG/ACT inhaler Inhale 2 puffs into the lungs every 4 (four) hours as needed.  . levalbuterol (XOPENEX) 1.25 MG/3ML nebulizer solution Take 1.25 mg by nebulization every 4 (four) hours as needed for wheezing.  Marland Kitchen levothyroxine (SYNTHROID, LEVOTHROID) 75 MCG tablet TAKE 1 TABLET BY MOUTH DAILY.  . mometasone (NASONEX) 50 MCG/ACT nasal spray Place 2 sprays into the nose daily as needed.  . ondansetron (ZOFRAN) 4 MG tablet Take 1 tablet (4 mg total) by mouth every 8 (eight) hours as needed for nausea or vomiting.  Marland Kitchen OVER THE COUNTER MEDICATION VST 3- probiotic 1 capsule daily.  Marland Kitchen arformoterol (BROVANA) 15 MCG/2ML NEBU Take 2 mLs (15 mcg total) by nebulization 2 (two) times daily.  . budesonide (PULMICORT) 0.25 MG/2ML nebulizer solution Take 4 mLs (0.5 mg total) by nebulization 2 (two) times daily.  . clobetasol cream (TEMOVATE) 7.53 % Apply 1 application topically as  needed.  . predniSONE (DELTASONE) 20 MG tablet Take 1 tablet (20 mg total) by mouth daily with breakfast.

## 2015-01-28 NOTE — Addendum Note (Signed)
Addended by: Len Blalock on: 01/28/2015 01:27 PM   Modules accepted: Orders

## 2015-01-28 NOTE — Assessment & Plan Note (Signed)
There has been no change in her recent ambulatory oximetry measurements. We will obtain an overnight oximetry test because she says she has been having to sleep with oxygen at night.

## 2015-01-28 NOTE — Assessment & Plan Note (Signed)
This is severe. She is not interested in taking Spiriva.  Plan: -As noted above and bronchiectasis

## 2015-01-29 ENCOUNTER — Telehealth: Payer: Self-pay | Admitting: Pulmonary Disease

## 2015-01-29 MED ORDER — BUDESONIDE 0.5 MG/2ML IN SUSP: 0.5000 mg | Freq: Two times a day (BID) | RESPIRATORY_TRACT | Status: DC

## 2015-01-29 NOTE — Telephone Encounter (Signed)
Spoke with Advertising copywriter at [employer]. BQ sent in rx for Pulmicort 0.25mg /64mL for 49mL BID. Medicare will not pay for this. It will have to be changed to Pulmicort 0.5mg /90mL BID. Verbal has been given to [facility]. Nothing further was needed.

## 2015-01-30 ENCOUNTER — Telehealth: Payer: Self-pay | Admitting: Pulmonary Disease

## 2015-01-30 ENCOUNTER — Telehealth: Payer: Self-pay

## 2015-01-30 NOTE — Telephone Encounter (Signed)
-----   Message from Juanito Doom, MD sent at 01/30/2015 12:07 PM EDT ----- A, Please let her know that this did not show any new changes.  I recommend we stick with the plan we outlined on the last visit. Thanks B

## 2015-01-30 NOTE — Telephone Encounter (Signed)
Needs to read the note again:  Plan: -Short prednisone burst for increased wheezing lately. -We will repeat a chest x-ray because one in January showed possible pneumonia, I think this is likely just mucus plugging **We will change Advair to Brovana and Pulmicort because she seems to do better with nebulized medications -Encouraged at length to use mucociliary clearance measures on a daily basis next line-encouraged at length to ambulate daily -Continue using albuterol at least twice a day as needed -Follow-up 2 months  LMTCB for Hannah Kline

## 2015-01-30 NOTE — Telephone Encounter (Signed)
Lynnae Sandhoff and she had to call the pharmacy. They are not seeing this in notes and I asked I refax the page were it mentions this. I have done so. nothing further needed

## 2015-01-30 NOTE — Telephone Encounter (Signed)
Kim returning call.Stanley A Dalton °

## 2015-01-30 NOTE — Telephone Encounter (Signed)
Pt aware of cxr results and recs.  Nothing further needed. 

## 2015-02-04 DIAGNOSIS — R0602 Shortness of breath: Secondary | ICD-10-CM | POA: Diagnosis not present

## 2015-02-13 ENCOUNTER — Telehealth: Payer: Self-pay

## 2015-02-13 DIAGNOSIS — G4734 Idiopathic sleep related nonobstructive alveolar hypoventilation: Secondary | ICD-10-CM

## 2015-02-13 NOTE — Telephone Encounter (Signed)
Spoke with pt, she is aware of recs.  Order placed.  Nothing further needed.  

## 2015-02-13 NOTE — Telephone Encounter (Signed)
-----   Message from Juanito Doom, MD sent at 02/13/2015  7:32 AM EDT ----- A, Please let her know that her ONO suggested she needs to use 2L [place] while sleeping. Thanks B

## 2015-02-24 DIAGNOSIS — C44712 Basal cell carcinoma of skin of right lower limb, including hip: Secondary | ICD-10-CM | POA: Diagnosis not present

## 2015-03-01 NOTE — Consult Note (Signed)
Brief Consult Note: Diagnosis: septic right ankle.   Patient was seen by consultant.   Recommend further assessment or treatment.   Comments: improved since admission will continue to follow will repeat aspiration as needed recommend another day of iv antibiotics.  Electronic Signatures: Laurene Footman (MD)  (Signed 14-Aug-15 13:09)  Authored: Brief Consult Note   Last Updated: 14-Aug-15 13:09 by Laurene Footman (MD)

## 2015-03-01 NOTE — Consult Note (Signed)
PATIENT NAME:  Hannah Kline, Hannah Kline MR#:  [id number] DATE OF BIRTH:  12-07-1931  DATE OF CONSULTATION:  06/22/2014  REFERRING PHYSICIAN:   CONSULTING PHYSICIAN:  Laurene Footman, MD  REASON FOR CONSULTATION: Right ankle, possible septic arthritis.   HISTORY OF PRESENT ILLNESS: The patient is a 81 year old admitted on 06/21/2014. She had sudden onset of severe ankle pain, unable to bear weight, the day prior to admission. No inciting injury or wound. She has been generally healthy and very active, and resides at [facility], and ambulates without an assistive device.   She came to the Emergency Room, where she was found to have a red, hot, and swollen ankle that was aspirated and there were no gout crystals or pseudogout crystals identified. There was an elevated white count on the joint aspiration, as well as CBC.   She was admitted for a presumed septic arthritis of the right ankle. I saw her yesterday, and she fell like she is better since her admission with IV antibiotics. Today, she still has ankle effusion, but the ankle is not hot. She is able to move the ankle approximately 20 degrees dorsiflexion and plantar flexion without pain; beyond that, there is some pain. Her final result from her ankle aspiration has been no growth; there were many white cells, but no bacteria.   CLINICAL IMPRESSION: Synovitis, right ankle, possibly septic, possibly transient synovitis. Recommend continue oral antibiotics. If she has recurrence or her symptoms worsen, I would recommend that she see Dr. Vickki Muff at [facility] for possible ankle arthroscopy for ankle synovitis, possible septic joint. I recommend that she minimize activity this weekend and increase activity as tolerated after discharge.     ____________________________ Laurene Footman, MD mjm:MT D: 06/22/2014 09:25:55 ET T: 06/22/2014 11:55:19 ET JOB#: [id number]  cc: Laurene Footman, MD, <Dictator> Laurene Footman MD ELECTRONICALLY SIGNED  06/22/2014 14:44

## 2015-03-01 NOTE — H&P (Signed)
PATIENT NAME:  Hannah, Kline MR#:  [id number] DATE OF BIRTH:  May 21, 1932  DATE OF ADMISSION:  06/21/2014  REFERRING PHYSICIAN: Dr. Owens Shark.   PRIMARY CARE PHYSICIAN:  Dr. Derrel Nip.   CHIEF COMPLAINT: Ankle pain.   HISTORY OF PRESENT ILLNESS: A 81 year old Caucasian female with past medical history of COPD, paroxysmal atrial fibrillation, presenting with acute onset of right ankle pain which occurred at rest while she was sitting on the couch watching TV. Described pain as 8 to 9 out of 10 in intensity described as "pain" with associated erythema, edema and warmth. The pain is worse with movement as well as standing. No relieving factors. Of note, she did have a pedicure earlier that day, but nothing out of the usual for her. No trauma elicited during pedicure. Denies any fevers, chills, further symptomatology. In the Emergency Department course arthrocentesis performed revealing elevated white count of 23,000 which was neutrophil predominance without crystals.   REVIEW OF SYSTEMS:  CONSTITUTIONAL: Denies fever, fatigue, weakness.   EYES: Denies blurred vision, double vision or eye pain.  EARS, NOSE, THROAT: Denies any tinnitus, ear pain, hearing loss.  RESPIRATORY: Denies coughing or shortness of breath.  CARDIOVASCULAR: Denies chest pain, palpitations, edema.  GASTROINTESTINAL: Denies nausea, vomiting, diarrhea or abdominal pain.  GENITOURINARY: Denies dysuria, hematuria.  ENDOCRINE:  Denies any active thyroid problems.  HEMATOLOGIC: No complaints of easy bruising or bleeding.   SKIN: Denies rash or lesion.  MUSCULOSKELETAL: Positive for right ankle pain as described above. Denies neck pain, back pain, shoulder, knee or hip pain or any further arthritic symptoms.  NEUROLOGIC: Denies paralysis, paresthesias.  PSYCHIATRIC: Denies anxiety or depressive symptoms.  Otherwise, full review of systems performed by me is negative.   PAST MEDICAL HISTORY: Paroxysmal atrial fibrillation as well as COPD.     SOCIAL HISTORY: Denies any tobacco or alcohol usage.   FAMILY HISTORY: Positive for coronary artery disease.   ALLERGIES: AVELOX.   HOME MEDICATIONS: Include digoxin 250 mcg p.o. q. daily, which she is not taking anymore, flecainide 50 mg p.o. b.i.d.,  Pradaxa which she is also not taking, Advair 250/50 mcg inhalation 1 puff b.i.d., Xopenex 45 mcg inhalation 2 puffs q.4 hours as needed for shortness of breath, dorzolamide/timolol ophthalmic solution 2.23/0.68% one drop to each eye b.i.d. and levothyroxine 75 mcg p.o. q. daily.   PHYSICAL EXAMINATION:  VITAL SIGNS: Temperature 97.9, heart rate 80, respirations 22, blood pressure 135/70, saturating 97% on room air. Weight 54.4 kg, BMI 18.3.  GENERAL: Well appearing Caucasian female, currently in no acute distress.  HEAD: Normocephalic, atraumatic.  EYES: Pupils equal, round, reactive to light. Extraocular muscles intact. No scleral icterus.  MOUTH: Moist mucous membranes. Dentition intact. No abscess noted.  EARS, NOSE, AND THROAT: Clear without exudates. No external lesions.  NECK: Supple. No thyromegaly. No nodules. No JVD.  PULMONARY: Clear to auscultation bilaterally without wheezes, rales or rhonchi. No use of accessary muscles. Good respiratory effort.  CHEST: Nontender to palpation.  CARDIOVASCULAR: S1, S2, regular rate and rhythm. No murmurs, rubs or gallops. No edema. Pedal pulses +2  bilaterally.  GASTROINTESTINAL: Soft, nontender, nondistended. No masses. Positive bowel sounds. No hepatosplenomegaly.  MUSCULOSKELETAL: Over the lateral portion of the right ankle around the lateral malleoli there is erythema and edema which causes tenderness on palpation which is also warm to touch. There is minimal edema noted on the medial aspect, however, she just had an arthrocentesis performed and had fluid drained. Although her range of motion is limited in right ankle  secondary to pain, otherwise, all other extremities range of motion intact.    NEUROLOGIC: Cranial nerves II through XII intact. No gross focal neurological deficits. Sensation intact. Reflexes intact.  SKIN: No ulceration, lesions, rashes, or cyanosis other than erythematous joint as described above.   PSYCHIATRIC: Mood and affect within normal limits. The patient alert and oriented x 3. Insight and judgment intact.   LABORATORY DATA: Sodium 138, potassium 4.1, chloride 104, bicarbonate 29, BUN 17, creatinine 0.83, glucose 100, troponin less than 0.02. WBC 16.7, hemoglobin 13.3, platelets of 257,000. Arthrocentesis fluid, nucleated cells 23,095, neutrophil predominant 98. No crystals. X-ray of the right ankle reveals no acute process other than regional soft tissue swelling.   ASSESSMENT AND PLAN: A 81 year old Caucasian female with history of chronic obstructive pulmonary disease and paroxysmal atrial fibrillation, presenting with acute onset of ankle pain.  1. Septic arthritis. Arthrocentesis was performed in the Emergency Department revealed leukocytosis which is neutrophil predominant. I would expect a higher neutrophil count for a septic joint somewhere between the 50,000 and 100,000 range, but being that this is acute onset monoarticular arthritis there is not gout or pseudogout we will treat as septic arthritis. Received vancomycin and Zosyn in the Emergency Department. We will continue these cultures. Blood cultures have been obtained and Gram stain is pending from the arthrocentesis fluid. We will consult orthopedics as the case has already been discussed with them in the Emergency Department. Provide pain medications and initiate bowel regimen.  2. Paroxysmal atrial fibrillation, currently in normal sinus rhythm, actually off of most of her medications including Pradaxa. We will continue her flecainide.  3. Chronic obstructive pulmonary disease. Continue Advair.  4. Venous thromboembolism prophylaxis with sequential compression devices.    CODE STATUS: The patient is  a full code.   TIME SPENT:  45 minutes.     ____________________________ Aaron Mose. Hower, MD dkh:JT D: 06/21/2014 02:21:48 ET T: 06/21/2014 02:38:52 ET JOB#: [id number]  cc: Aaron Mose. Hower, MD, <Dictator> DAVID Woodfin Ganja MD ELECTRONICALLY SIGNED 06/21/2014 23:06

## 2015-03-01 NOTE — Discharge Summary (Signed)
PATIENT NAME:  Hannah Kline, Hannah Kline MR#:  [id number] DATE OF BIRTH:  1932/04/24  DATE OF ADMISSION:  06/21/2014 DATE OF DISCHARGE:  06/22/2014  ADMISSION DIAGNOSIS: Right ankle pain with septic arthritis.  DISCHARGE DIAGNOSIS:  Right ankle pain with possible septic arthritis versus synovitis,  viral in etiology.   CONSULTATIONS: Dr. Rudene Christians.   LABORATORIES:   1.  Blood cultures are negative.  2.  Synovial cultures were showing no organisms seen. No crystals.   HOSPITAL COURSE: A 81 year old female who presented with acute right ankle pain. For further details, please refer to Dr. Samantha Crimes H and P.   1.  Right ankle pain with possible septic arthritis. The patient had a joint aspiration in the ER, which did show many neutrophils; however, her cultures actually came back negative. She had not been started on antibiotics at that time. Orthopedics was consulted. Dr. Rudene Christians thought that this could be viral synovitis; however, as her pain did improve with the antibiotics, so antibiotics are obviously recommended. She was placed on broad-spectrum antibiotics here. Blood cultures were also negative to date. She will be discharged with Bactrim. She will need follow-up with her primary care physician and orthopedic surgeon within 10 days.   2.  History of paroxysmal atrial fibrillation.  She was in normal sinus rhythm. She is on flecainide . She says that she takes digitoxin and Pradaxa on an as needed basis.   3.  Chronic obstructive pulmonary disease, which was stable.   DISCHARGE MEDICATIONS:  1.  Bactrim 1 tablet p.o. b.i.d. double strength for 12 days.  2.  Probiotic 1 tablet daily for 15 days.  3.  Pradaxa p.r.n.  4.  Digoxin 0.25 daily p.r.n.  5.  Oxygen 2 liters p.r.n. with exertion.  6.  Xopenex 2 puffs q. 4 hours.  7.  Flecainide 50 mg b.i.d.  8.  Dorzolamide (Timolol) ophthalmic solution 1 drop in affected eye b.i.d.  9.  Advair Diskus 250/50 b.i.d.  10.  Synthroid 75 mcg daily.   DISCHARGE DIET:  Regular diet.   DISCHARGE ACTIVITY: As tolerated.   DISCHARGE FOLLOW-UP: the patient will follow up in 1 week with Dr. Rudene Christians and should follow up with Dr. Derrel Nip in 1 week.   TIME SPENT: 35 minutes. The patient is stable for discharge.    ____________________________ Jim Philemon P. Benjie Karvonen, MD spm:TT D: 06/22/2014 11:25:33 ET T: 06/22/2014 14:52:00 ET JOB#: [id number]  cc: Natsuko Kelsay P. Benjie Karvonen, MD, <Dictator> Laurene Footman, MD Deborra Medina, MD Donell Beers Arisbeth Purrington MD ELECTRONICALLY SIGNED 06/22/2014 21:23

## 2015-03-17 ENCOUNTER — Ambulatory Visit: Payer: Medicare Other | Admitting: Cardiovascular Disease

## 2015-03-24 ENCOUNTER — Ambulatory Visit: Payer: Medicare Other | Admitting: Cardiovascular Disease

## 2015-03-31 ENCOUNTER — Encounter: Payer: Self-pay | Admitting: Pulmonary Disease

## 2015-03-31 DIAGNOSIS — Z1283 Encounter for screening for malignant neoplasm of skin: Secondary | ICD-10-CM | POA: Diagnosis not present

## 2015-03-31 DIAGNOSIS — L820 Inflamed seborrheic keratosis: Secondary | ICD-10-CM | POA: Diagnosis not present

## 2015-03-31 DIAGNOSIS — Z85828 Personal history of other malignant neoplasm of skin: Secondary | ICD-10-CM | POA: Diagnosis not present

## 2015-03-31 DIAGNOSIS — Z85820 Personal history of malignant melanoma of skin: Secondary | ICD-10-CM | POA: Diagnosis not present

## 2015-03-31 DIAGNOSIS — D229 Melanocytic nevi, unspecified: Secondary | ICD-10-CM | POA: Diagnosis not present

## 2015-03-31 DIAGNOSIS — L578 Other skin changes due to chronic exposure to nonionizing radiation: Secondary | ICD-10-CM | POA: Diagnosis not present

## 2015-03-31 DIAGNOSIS — L720 Epidermal cyst: Secondary | ICD-10-CM | POA: Diagnosis not present

## 2015-03-31 DIAGNOSIS — D1800 Hemangioma unspecified site: Secondary | ICD-10-CM | POA: Diagnosis not present

## 2015-03-31 DIAGNOSIS — D485 Neoplasm of uncertain behavior of skin: Secondary | ICD-10-CM | POA: Diagnosis not present

## 2015-03-31 DIAGNOSIS — L821 Other seborrheic keratosis: Secondary | ICD-10-CM | POA: Diagnosis not present

## 2015-03-31 DIAGNOSIS — D692 Other nonthrombocytopenic purpura: Secondary | ICD-10-CM | POA: Diagnosis not present

## 2015-04-10 ENCOUNTER — Ambulatory Visit (INDEPENDENT_AMBULATORY_CARE_PROVIDER_SITE_OTHER): Payer: Medicare Other | Admitting: Pulmonary Disease

## 2015-04-10 ENCOUNTER — Encounter: Payer: Self-pay | Admitting: Pulmonary Disease

## 2015-04-10 VITALS — BP 122/64 | HR 62 | Ht 68.0 in | Wt 119.0 lb

## 2015-04-10 DIAGNOSIS — R0902 Hypoxemia: Secondary | ICD-10-CM

## 2015-04-10 DIAGNOSIS — J479 Bronchiectasis, uncomplicated: Secondary | ICD-10-CM | POA: Diagnosis not present

## 2015-04-10 DIAGNOSIS — A047 Enterocolitis due to Clostridium difficile: Secondary | ICD-10-CM

## 2015-04-10 DIAGNOSIS — J411 Mucopurulent chronic bronchitis: Secondary | ICD-10-CM | POA: Diagnosis not present

## 2015-04-10 DIAGNOSIS — Z9981 Dependence on supplemental oxygen: Secondary | ICD-10-CM

## 2015-04-10 DIAGNOSIS — A0472 Enterocolitis due to Clostridium difficile, not specified as recurrent: Secondary | ICD-10-CM

## 2015-04-10 MED ORDER — LEVALBUTEROL TARTRATE 45 MCG/ACT IN AERO: 2.0000 | INHALATION_SPRAY | RESPIRATORY_TRACT | Status: DC | PRN

## 2015-04-10 MED ORDER — ONDANSETRON HCL 4 MG PO TABS: 4.0000 mg | ORAL_TABLET | Freq: Three times a day (TID) | ORAL | Status: DC | PRN

## 2015-04-10 NOTE — Assessment & Plan Note (Signed)
She has severe disease but this has been a stable interval. Plan as detailed above.

## 2015-04-10 NOTE — Progress Notes (Signed)
Subjective:    Patient ID: Hannah Kline, female    DOB: 1932-09-17, 81 y.o.   MRN: [id number] Synopsis: [name] established care with the [facility] pulmonary office in February 2013 for bronchiectasis due to [place]. She was diagnosed at age 13 in [place]. She was treated with rifampin ethambutol and clarithromycin for 2 years. Since then she does not believe she was ever grown out MAI.  She had c.diff colitis in 2015 which was refractory to treatment.    HPI  Chief Complaint  Patient presents with  . Follow-up    Pt has no complaints today.  Pulmicort and Brovana working well for pt.  Pt wants a new rx for zofran.     Takyra has been doing well and has been using the brovana and pulmicort and she says this has really helped.  She says that there are some days when she feels really great and she has been going to eight exercise classes a week which she really likes.  This includes cardio and yoga work and Char[employer] fundraiser.  She says that the nebulizer really helps clear the mucus twice a day.  She says that she has struggled with some weakness and dizziness but this has improved. No treatment with antibiotics since she saw me last.  She would like a renewal of Xopenex.  She still struggles with nausea from time to time and uses zofran sparingly, (about once per month), requesting refill.   Past Medical History  Diagnosis Date  . Atrial fibrillation   . History of Mycobacterium avium complex infection     lung disease  . COPD (chronic obstructive pulmonary disease)    . Bronchiectasis   . Hepatitis     h/o INH  . Bacterial infection due to Pseudomonas   . Pleurisy 12/08/11     Review of Systems  Constitutional: Negative for fever, chills, appetite change, fatigue and unexpected weight change.  HENT: Negative for congestion, ear pain and nosebleeds.   Respiratory: Negative for cough, choking and shortness of breath.   Cardiovascular: Negative for  chest pain and leg swelling.       Objective:   Physical Exam Filed Vitals:   04/10/15 0913  BP: 122/64  Pulse: 62  Height: 5\' 8"  (1.727 m)  Weight: 119 lb (53.978 kg)  SpO2: 95%   2L   Gen: well appearing, no acute distress HEENT: NCAT, EOMi, OP clear,  PULM: few crackles, good air movement CV: Irreg irreg, no mgr, no JVD AB: BS+, soft, nontender, no hsm Ext: warm, no edema, no clubbing, no cyanosis  Review of 2012 sputum microbiology: Pan sensitive pseudomonas  09/2010 Full PFT: Ratio 63%, FEV1 1.49L (67% pred)  10/2011 CT Chest: impressive upper lobe cystic bronchiectasis, scattered lower lobe tree-in-bud abnormalities and scattered nodules; One RML solid nodule measures 7.68mm  January 2013 CT chest [facility]: Stable upper lobe cystic bronchiectasis also present in the right middle lobe. Scattered tree in bud abnormalities and nodules throughout all lung fields roughly unchanged from prior. Right middle lobe nodule is no longer visible but there is an 25mm nodule in the right upper lobe.  December 2013 simple spirometry FEV1 0.8 L  10/11/2012 Sputum: PSEUDOMONAS AERUGINOSA       Antibiotic  Sensitivity  Microscan  Status    CEFEPIME  Sensitive  2  Final    CEFTAZIDIME  Sensitive  <=1  Final    CIPROFLOXACIN  Intermediate  2  Final    GENTAMICIN  Sensitive  <=1  Final    IMIPENEM  Sensitive  1  Final    LEVOFLOXACIN  Intermediate  4  Final    PIP/TAZO  Sensitive  <=4  Final    TOBRAMYCIN  Sensitive  <=1  Final    January 2014 simple spirometry >> not reproducible by ATS standards  January 2014 Sputum AFB > neg x3 11/2013 FEV1 0.93 L  2015 Sputum> Pseudomonas , Cipro/Levofloxacin Intermediate suscept; Cefepime,Ceftaz, Imi, Pip-Tazo, Gent all susceptible  September 2015 sputum culture> Klebsiella resistant to amoxicillin but susceptible to Augmentin and multiple other agents     Assessment & Plan:   BRONCHIECTASIS This has been a stable interval for Hannah Kline. She does  much better with nebulized therapy. Today I reminded her to continue using the  Brovana and Pulmicort twice a day for matter how she feels. This serves as mucociliary clearance for her as she coughs of significant mucus after using the nebulizer every time.  Plan: Should she have an episode of bronchitis I would prefer for her to increase her mucociliary clearance measures with postero-drainage and an extra Xopenex nebulizer throughout the day before using antibodies because of her history of C. difficile Continue Brovana and Pulmicort Follow-up 4 months or sooner if needed   Hypoxemia requiring supplemental oxygen Continue 2 L with exertion and daily at bedtime   Colitis, Clostridium difficile She has not had recurrence fortunately. However ever since the C. difficile she has struggled with off and on nausea and an upset stomach. She says that this is better with a probiotic.  Plan: Zofran renewed, advised to use sparingly Advised to use a probiotic on a daily basis   Chronic bronchitis She has severe disease but this has been a stable interval. Plan as detailed above.     Updated Medication List Outpatient Encounter Prescriptions as of 04/10/2015  Medication Sig  . arformoterol (BROVANA) 15 MCG/2ML NEBU Take 2 mLs (15 mcg total) by nebulization 2 (two) times daily.  . budesonide (PULMICORT) 0.5 MG/2ML nebulizer solution Take 2 mLs (0.5 mg total) by nebulization 2 (two) times daily.  . clobetasol cream (TEMOVATE) 8.41 % Apply 1 application topically as needed.  . dabigatran (PRADAXA) 150 MG CAPS capsule Take 150 mg by mouth as needed.   . dorzolamide-timolol (COSOPT) 22.3-6.8 MG/ML ophthalmic solution Place 1 drop into the left eye every 12 (twelve) hours.   . flecainide (TAMBOCOR) 50 MG tablet TAKE 1 TABLET BY MOUTH TWICE A DAY (Patient taking differently: qd)  . Fluticasone-Salmeterol (ADVAIR) 250-50 MCG/DOSE AEPB Inhale 1 puff into the lungs 2 (two) times daily.  Marland Kitchen latanoprost  (XALATAN) 0.005 % ophthalmic solution Place 1 drop into the left eye At bedtime.  . levalbuterol (XOPENEX) 1.25 MG/3ML nebulizer solution Take 1.25 mg by nebulization every 4 (four) hours as needed for wheezing.  Marland Kitchen levothyroxine (SYNTHROID, LEVOTHROID) 75 MCG tablet TAKE 1 TABLET BY MOUTH DAILY.  . mometasone (NASONEX) 50 MCG/ACT nasal spray Place 2 sprays into the nose daily as needed.  Marland Kitchen OVER THE COUNTER MEDICATION VST 3- probiotic 1 capsule daily.  . predniSONE (DELTASONE) 20 MG tablet Take 1 tablet (20 mg total) by mouth daily with breakfast.  . levalbuterol (XOPENEX HFA) 45 MCG/ACT inhaler Inhale 2 puffs into the lungs every 4 (four) hours as needed.  . ondansetron (ZOFRAN) 4 MG tablet Take 1 tablet (4 mg total) by mouth every 8 (eight) hours as needed for nausea or vomiting.  . [DISCONTINUED] levalbuterol (XOPENEX HFA) 45 MCG/ACT inhaler  Inhale 2 puffs into the lungs every 4 (four) hours as needed. (Patient not taking: Reported on 04/10/2015)  . [DISCONTINUED] ondansetron (ZOFRAN) 4 MG tablet Take 1 tablet (4 mg total) by mouth every 8 (eight) hours as needed for nausea or vomiting. (Patient not taking: Reported on 04/10/2015)   No facility-administered encounter medications on file as of 04/10/2015.

## 2015-04-10 NOTE — Patient Instructions (Signed)
Use the Brovana and Pulmicort twice a day On days when you have increased mucus congestion I recommend that you use Xopenex nebulizer and your postero-drainage techniques in order to help clear mucus Take Zofran as needed for nausea Use a probiotic every day We will see you back in 4 months or sooner if needed

## 2015-04-10 NOTE — Assessment & Plan Note (Signed)
This has been a stable interval for [place]. She does much better with nebulized therapy. Today I reminded her to continue using the  Brovana and Pulmicort twice a day for matter how she feels. This serves as mucociliary clearance for her as she coughs of significant mucus after using the nebulizer every time.  Plan: Should she have an episode of bronchitis I would prefer for her to increase her mucociliary clearance measures with postero-drainage and an extra Xopenex nebulizer throughout the day before using antibodies because of her history of C. difficile Continue Brovana and Pulmicort Follow-up 4 months or sooner if needed

## 2015-04-10 NOTE — Assessment & Plan Note (Signed)
She has not had recurrence fortunately. However ever since the C. difficile she has struggled with off and on nausea and an upset stomach. She says that this is better with a probiotic.  Plan: Zofran renewed, advised to use sparingly Advised to use a probiotic on a daily basis

## 2015-04-10 NOTE — Assessment & Plan Note (Signed)
Continue 2 L with exertion and daily at bedtime 

## 2015-04-25 ENCOUNTER — Encounter: Payer: Self-pay | Admitting: Cardiovascular Disease

## 2015-04-25 ENCOUNTER — Ambulatory Visit (INDEPENDENT_AMBULATORY_CARE_PROVIDER_SITE_OTHER): Payer: Medicare Other | Admitting: Cardiovascular Disease

## 2015-04-25 VITALS — BP 120/62 | HR 66 | Ht 68.0 in | Wt 120.2 lb

## 2015-04-25 DIAGNOSIS — J479 Bronchiectasis, uncomplicated: Secondary | ICD-10-CM

## 2015-04-25 DIAGNOSIS — I4891 Unspecified atrial fibrillation: Secondary | ICD-10-CM

## 2015-04-25 NOTE — Progress Notes (Signed)
Patient ID: Hannah Kline, female    DOB: 1932-01-30, 81 y.o.   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 history of C. Difficile in genera 2015 after treatment with Cipro for bronchiectasis Symptoms resolved with Flagyl 2  In follow-up today, she reports that she is doing well. She is now on oxygen as needed. Reports having improvement in her shortness of breath with recent inhaler changes. One episode of tachycardia last week in the evening, took extra flecainide and by the morning rhythm was back to normal If she does have an episode, typically takes extra flecainide. Weight has been relatively stable. She does not feel like eating much She is doing regular exercise  EKG shows normal sinus rhythm with rate 66 bpm, no significant ST or T-wave changes  Past medical history In the past, she has failed sotalol. Amiodarone was discontinued given underlying lung disease. She was started on  Flecainide 50 mg twice a day with good success.  Episode of atrial fibrillation on April 27th to the 29th 2014. She came to the office, had an EKG. She took digoxin x2,  extra flecainide. This was the longest episode of atrial fibrillation she has had. She is symptomatic in atrial fibrillation. She was also given pradaxa and took his twice a day.   Other  Episodes of tachycardia in July 2013, October 2013.      Allergies  Allergen Reactions  . Isoniazid     unknown  . Moxifloxacin     Avelox  REACTION: chills, fainting    Current Outpatient Prescriptions on File Prior to Visit  Medication Sig Dispense Refill  . arformoterol (BROVANA) 15 MCG/2ML NEBU Take 2 mLs (15 mcg total) by nebulization 2 (two) times daily. 60 mL 11  . budesonide (PULMICORT) 0.5 MG/2ML nebulizer  solution Take 2 mLs (0.5 mg total) by nebulization 2 (two) times daily. 120 mL 11  . clobetasol cream (TEMOVATE) 2.70 % Apply 1 application topically as needed.  2  . dabigatran (PRADAXA) 150 MG CAPS capsule Take 150 mg by mouth as needed.     . dorzolamide-timolol (COSOPT) 22.3-6.8 MG/ML ophthalmic solution Place 1 drop into the left eye every 12 (twelve) hours.     . flecainide (TAMBOCOR) 50 MG tablet TAKE 1 TABLET BY MOUTH TWICE A DAY (Patient taking differently: qd) 60 tablet 3  . latanoprost (XALATAN) 0.005 % ophthalmic solution Place 1 drop into the left eye At bedtime.    . levalbuterol (XOPENEX) 1.25 MG/3ML nebulizer solution Take 1.25 mg by nebulization every 4 (four) hours as needed for wheezing. 72 mL 2  . levothyroxine (SYNTHROID, LEVOTHROID) 75 MCG tablet TAKE 1 TABLET BY MOUTH DAILY. 90 tablet 3  . mometasone (NASONEX) 50 MCG/ACT nasal spray Place 2 sprays into the nose daily as needed. 17 g 5  . ondansetron (ZOFRAN) 4 MG tablet Take 1 tablet (4 mg total) by mouth every 8 (eight) hours as needed for nausea or vomiting. 30 tablet 3  . OVER THE COUNTER MEDICATION VST 3- probiotic 1 capsule daily.     No current facility-administered medications on file prior to visit.    Past Medical History  Diagnosis Date  . Atrial fibrillation   . History of Mycobacterium avium complex infection     lung disease  . COPD (  chronic obstructive pulmonary disease)    . Bronchiectasis   . Hepatitis     h/o INH  . Bacterial infection due to Pseudomonas   . Pleurisy 12/08/11    Past Surgical History  Procedure Laterality Date  . Shoulder arthroscopy      right    Social History  reports that she has never smoked. She has never used smokeless tobacco. She reports that she drinks alcohol. She reports that she does not use illicit drugs.  Family History family history includes Angina in her mother; Diabetes in her maternal grandfather; Other in her mother.  Review of Systems   Constitutional: Negative.   Respiratory: Positive for shortness of breath.   Cardiovascular: Negative.   Gastrointestinal: Negative.   Musculoskeletal: Negative.   Skin: Negative.   Neurological: Negative.   Psychiatric/Behavioral: Negative.   All other systems reviewed and are negative.    BP 120/62 mmHg  Pulse 66  Ht 5\' 8"  (1.727 m)  Wt 120 lb 4 oz (54.545 kg)  BMI 18.29 kg/m2  Physical Exam  Constitutional: She is oriented to person, place, and time. She appears well-developed and well-nourished.  HENT:  Head: Normocephalic.  Nose: Nose normal.  Mouth/Throat: Oropharynx is clear and moist.  Eyes: Conjunctivae are normal. Pupils are equal, round, and reactive to light.  Neck: Normal range of motion. Neck supple. No JVD present.  Cardiovascular: Normal rate, regular rhythm, S1 normal, S2 normal, normal heart sounds and intact distal pulses.  Exam reveals no gallop and no friction rub.   No murmur heard. Pulmonary/Chest: Effort normal. No respiratory distress. She has decreased breath sounds. She has no wheezes. She has rales. She exhibits no tenderness.  Abdominal: Soft. Bowel sounds are normal. She exhibits no distension. There is no tenderness.  Musculoskeletal: Normal range of motion. She exhibits no edema or tenderness.  Lymphadenopathy:    She has no cervical adenopathy.  Neurological: She is alert and oriented to person, place, and time. Coordination normal.  Skin: Skin is warm and dry. No rash noted. No erythema.  Psychiatric: She has a normal mood and affect. Her behavior is normal. Judgment and thought content normal.    Assessment and Plan  Nursing note and vitals reviewed.

## 2015-04-25 NOTE — Assessment & Plan Note (Signed)
She reports symptoms have been stable, improved on oxygen and new inhaler regimen

## 2015-04-25 NOTE — Patient Instructions (Signed)
You are doing well. No medication changes were made.  Please call us if you have new issues that need to be addressed before your next appt.  Your physician wants you to follow-up in: 6 months.  You will receive a reminder letter in the mail two months in advance. If you don't receive a letter, please call our office to schedule the follow-up appointment.   

## 2015-04-25 NOTE — Assessment & Plan Note (Signed)
Rare episodes of atrial fibrillation. No changes to her medications. She takes  takes flecainide 50 mg twice a day

## 2015-04-28 DIAGNOSIS — H401492 Capsular glaucoma with pseudoexfoliation of lens, unspecified eye, moderate stage: Secondary | ICD-10-CM | POA: Diagnosis not present

## 2015-05-28 ENCOUNTER — Telehealth: Payer: Self-pay | Admitting: *Deleted

## 2015-05-28 NOTE — Telephone Encounter (Signed)
Pt calling stating that this morning she woke up with a "fast heart rate"  It is around 130-120. States it is more tacyarida than anything to her.  Would like to know what to do Please advise.

## 2015-05-28 NOTE — Telephone Encounter (Signed)
She may take Flecainide 50 mg BID. Please have her come for an ECG Thursday or Friday .

## 2015-05-28 NOTE — Telephone Encounter (Signed)
S/w pt who states woke up this morning shaky with HR fluctuating 60s, 90s, 120s. Feels she is in afib. Currently 87bpm Oxygen 97% room air. Denies chest pain. Took digoxin (old prescription which expired in 2015) and flecainide around 7:30am. Only takes flecainide once a day. States has been" a long time" since she took Pradaxa however, she just took it right before calling the office. Would like to know if it is ok to take another flecainide now and should she start taking it twice per day. Recommended she discard expired digoxin and will told her I would forward to Nahser for review.

## 2015-05-28 NOTE — Telephone Encounter (Signed)
S/w pt regarding recommendations. Scheduled EKG 7/22 9:30am Pt verbalized understanding.

## 2015-05-30 ENCOUNTER — Other Ambulatory Visit: Payer: Self-pay

## 2015-05-30 ENCOUNTER — Ambulatory Visit: Payer: Medicare Other

## 2015-05-30 ENCOUNTER — Telehealth: Payer: Self-pay

## 2015-05-30 VITALS — HR 61 | Resp 18

## 2015-05-30 DIAGNOSIS — I4891 Unspecified atrial fibrillation: Secondary | ICD-10-CM

## 2015-05-30 MED ORDER — FLECAINIDE ACETATE 50 MG PO TABS: 50.0000 mg | ORAL_TABLET | Freq: Two times a day (BID) | ORAL | Status: DC

## 2015-05-30 NOTE — Patient Instructions (Signed)
1.) Reason for visit: EKG  2.) Name of MD requesting visit: Nahser  3.) H&P: Hx of afib. Takes flecainide qd  4.) ROS related to problem: 7/20 felt to be in afib. Took pradaxa, digoxin (expired prescription), flecainide x2. Symptoms improved. Since then, taking flecainide qd. Would like refill on digoxin but not currently on med list.  5.) Assessment and plan per MD: Forward to Dr. Fletcher Anon for review and recommendations.

## 2015-05-30 NOTE — Telephone Encounter (Signed)
S/w pt to inform her per MD, EKG ok Flecainide refill submitted. Awaiting Dr. Donivan Scull recommendations next week. Pt verbalized understanding with no further questions.

## 2015-05-30 NOTE — Telephone Encounter (Signed)
Notified pt Dr. Fletcher Anon reviewed EKG and I have submitted flecainide refill to pharmacy. Pt states she has expired prescription for digoxin for which she took one pill when she felt in afib. Would like to know if she needs new prescription. Will forward to [facility]

## 2015-06-02 ENCOUNTER — Encounter: Payer: Self-pay | Admitting: Cardiovascular Disease

## 2015-06-03 NOTE — Telephone Encounter (Signed)
I suspect she will not need the digoxin Would recommend she take extra flecainide as she has in the past for breakthrough atrial fibrillation If she finds this does not work for her, would certainly let us know Would then probably give prescription of diltiazem 30 mg when necessary (#30) to take with flecainide

## 2015-06-04 NOTE — Telephone Encounter (Signed)
S/w pt regarding Dr. Donivan Scull recommendations. Pt verbalized understanding and states she will take extra flecainide for breakthrough afib. Will notify us if it does not work

## 2015-06-04 NOTE — Telephone Encounter (Signed)
LMOM for pt to call back.

## 2015-06-11 ENCOUNTER — Telehealth: Payer: Self-pay | Admitting: *Deleted

## 2015-06-11 MED ORDER — DIGOXIN 250 MCG PO TABS: 0.2500 mg | ORAL_TABLET | Freq: Every day | ORAL | Status: DC

## 2015-06-11 NOTE — Telephone Encounter (Signed)
Pt calling stating that she is back in Afib  About 12 hours, started last night Pt states she has taken Flecainide and Pradaxa  In past has taken Digoxin but is not sure if she needs to take it or not Please advise.

## 2015-06-11 NOTE — Telephone Encounter (Signed)
S/w pt regarding Dr. Donivan Scull recommendations. Pt states she has taken digoxin 0.25mg   Prescription sent to [facility] Pt states she typically converts back to NSR within a day and will notify us if meds do not work.

## 2015-06-11 NOTE — Telephone Encounter (Signed)
S/w pt who states she has been in afib since last night.  Was in afib last week and extra pradaxa and flecainide worked but does not seem to be stopping afib this time.  Reports HR 60s-110s. Took extra flecainide and pradaxa at 11pm last night and again at 6am this morning.  Continues to be in afib.  States digoxin has worked in the past and asking if she can have prescription. Forward to MD for recommendations.

## 2015-06-11 NOTE — Telephone Encounter (Signed)
Okay to send in prescription of digoxin To take in addition to her flecainide

## 2015-07-15 ENCOUNTER — Ambulatory Visit (INDEPENDENT_AMBULATORY_CARE_PROVIDER_SITE_OTHER): Payer: Medicare Other | Admitting: Pulmonary Disease

## 2015-07-15 ENCOUNTER — Encounter: Payer: Self-pay | Admitting: Pulmonary Disease

## 2015-07-15 ENCOUNTER — Ambulatory Visit (INDEPENDENT_AMBULATORY_CARE_PROVIDER_SITE_OTHER)
Admission: RE | Admit: 2015-07-15 | Discharge: 2015-07-15 | Disposition: A | Discharge status: Home / Self Care | Payer: Medicare Other | Source: Ambulatory Visit | Attending: Pulmonary Disease | Admitting: Pulmonary Disease

## 2015-07-15 VITALS — BP 124/70 | HR 66 | Ht 68.0 in | Wt 118.8 lb

## 2015-07-15 DIAGNOSIS — J411 Mucopurulent chronic bronchitis: Secondary | ICD-10-CM | POA: Diagnosis not present

## 2015-07-15 DIAGNOSIS — J471 Bronchiectasis with (acute) exacerbation: Secondary | ICD-10-CM | POA: Diagnosis not present

## 2015-07-15 DIAGNOSIS — R0602 Shortness of breath: Secondary | ICD-10-CM

## 2015-07-15 DIAGNOSIS — R05 Cough: Secondary | ICD-10-CM | POA: Diagnosis not present

## 2015-07-15 DIAGNOSIS — J449 Chronic obstructive pulmonary disease, unspecified: Secondary | ICD-10-CM | POA: Diagnosis not present

## 2015-07-15 MED ORDER — LEVALBUTEROL HCL 1.25 MG/3ML IN NEBU: 1.2500 mg | INHALATION_SOLUTION | Freq: Four times a day (QID) | RESPIRATORY_TRACT | Status: DC

## 2015-07-15 MED ORDER — DOXYCYCLINE HYCLATE 100 MG PO TABS: 100.0000 mg | ORAL_TABLET | Freq: Two times a day (BID) | ORAL | Status: DC

## 2015-07-15 MED ORDER — LEVALBUTEROL HCL 1.25 MG/3ML IN NEBU: 1.2500 mg | INHALATION_SOLUTION | RESPIRATORY_TRACT | Status: DC | PRN

## 2015-07-15 MED ORDER — PREDNISONE 10 MG PO TABS: ORAL_TABLET | ORAL | Status: DC

## 2015-07-15 NOTE — Assessment & Plan Note (Signed)
Hannah Kline is having worsening shortness of breath over the course of the summer time with some weight loss. Today on exam she has a significant amount of wheezing with very little air movement. I believe that her shortness of breath is due to her bronchiectasis and she is an exacerbation right now. We have attempted to treat this with mucociliary clearance measures in the past but she feels that the Darvon is not helping. I suppose it's possible that that could be drying out her secretions and making it more difficult for her to pass mucus.  Plan: Chest x-ray Cautious use of antibiotics, I'm going to use doxycycline twice a day for 7 days and encourage her to use a probiotic during that time Stop brovana Use open neck for times a day Continue Pulmicort Short prednisone taper considering all the wheezing she has today Follow-up in 4-6 weeks or sooner if needed

## 2015-07-15 NOTE — Progress Notes (Signed)
Subjective:    Patient ID: Hannah Kline, female    DOB: [birth year], 81 y.o.   MRN: [id number] Synopsis: Hannah Kline established care with the [facility] pulmonary office in February 2013 for bronchiectasis due to [place]. She was diagnosed at age 5 in [place]. She was treated with rifampin ethambutol and clarithromycin for 2 years. Since then she does not believe she was ever grown out MAI.  She had c.diff colitis in 2015 which was refractory to treatment.    HPI  Chief Complaint  Patient presents with  . Follow-up    pt states she has increase of SOB and fatigued since the beginnig of the summer. pt is limited to activity. pt has a productive cough greyish in color.  pt has lots of nausea / vomitting at times.    Hannah Kline says that her dyspnea is "terrible" compared to the last visit.  She feels more dyspneic since the last visit.  She feels tired all the time, weak, and feel bad overall.  She has been using the brovana only once a day because she doesn't like it.  She coughs daily and produces mucus every day.  No change to the color or volume of the mucus.  No pain anywhere.  No resting dyspnea, only exertional.  She has dyspnea when getting up in the middle of the night.  Past Medical History  Diagnosis Date  . Atrial fibrillation   . History of Mycobacterium avium complex infection     lung disease  . COPD (chronic obstructive pulmonary disease)    . Bronchiectasis   . Hepatitis     h/o INH  . Bacterial infection due to Pseudomonas   . Pleurisy 12/08/11     Review of Systems  Constitutional: Negative for fever, chills, appetite change, fatigue and unexpected weight change.  HENT: Negative for congestion, ear pain and nosebleeds.   Respiratory: Negative for cough, choking and shortness of breath.   Cardiovascular: Negative for chest pain and leg swelling.       Objective:   Physical Exam Filed Vitals:   07/15/15 1619  BP: 124/70  Pulse: 66   Height: 5\' 8"  (1.727 m)  Weight: 118 lb 12.8 oz (53.887 kg)  SpO2: 93%   2L   Gen: well appearing, no acute distress HEENT: NCAT, EOMi, OP clear,  PULM: lots of wheezing, crackles, very little air movement CV: Irreg irreg, no mgr, no JVD AB: BS+, soft, nontender, no hsm Ext: warm, no edema, no clubbing, no cyanosis  Review of 2012 sputum microbiology: Pan sensitive pseudomonas  09/2010 Full PFT: Ratio 63%, FEV1 1.49L (67% pred)  10/2011 CT Chest: impressive upper lobe cystic bronchiectasis, scattered lower lobe tree-in-bud abnormalities and scattered nodules; One RML solid nodule measures 7.28mm  January 2013 CT chest [facility]: Stable upper lobe cystic bronchiectasis also present in the right middle lobe. Scattered tree in bud abnormalities and nodules throughout all lung fields roughly unchanged from prior. Right middle lobe nodule is no longer visible but there is an 48mm nodule in the right upper lobe.  December 2013 simple spirometry FEV1 0.8 L  10/11/2012 Sputum: PSEUDOMONAS AERUGINOSA       Antibiotic  Sensitivity  Microscan  Status    CEFEPIME  Sensitive  2  Final    CEFTAZIDIME  Sensitive  <=1  Final    CIPROFLOXACIN  Intermediate  2  Final    GENTAMICIN  Sensitive  <=1  Final    IMIPENEM  Sensitive  1  Final    LEVOFLOXACIN  Intermediate  4  Final    PIP/TAZO  Sensitive  <=4  Final    TOBRAMYCIN  Sensitive  <=1  Final    January 2014 simple spirometry >> not reproducible by ATS standards  January 2014 Sputum AFB > neg x3 11/2013 FEV1 0.93 L  2015 Sputum> Pseudomonas , Cipro/Levofloxacin Intermediate suscept; Cefepime,Ceftaz, Imi, Pip-Tazo, Gent all susceptible  September 2015 sputum culture> Klebsiella resistant to amoxicillin but susceptible to Augmentin and multiple other agents     Assessment & Plan:   BRONCHIECTASIS Hannah Kline is having worsening shortness of breath over the course of the summer time with some weight loss. Today on exam she has a significant amount  of wheezing with very little air movement. I believe that her shortness of breath is due to her bronchiectasis and she is an exacerbation right now. We have attempted to treat this with mucociliary clearance measures in the past but she feels that the Darvon is not helping. I suppose it's possible that that could be drying out her secretions and making it more difficult for her to pass mucus.  Plan: Chest x-ray Cautious use of antibiotics, I'm going to use doxycycline twice a day for 7 days and encourage her to use a probiotic during that time Stop brovana Use open neck for times a day Continue Pulmicort Short prednisone taper considering all the wheezing she has today Follow-up in 4-6 weeks or sooner if needed    Updated Medication List Outpatient Encounter Prescriptions as of 07/15/2015  Medication Sig  . arformoterol (BROVANA) 15 MCG/2ML NEBU Take 2 mLs (15 mcg total) by nebulization 2 (two) times daily.  . budesonide (PULMICORT) 0.5 MG/2ML nebulizer solution Take 2 mLs (0.5 mg total) by nebulization 2 (two) times daily.  . clobetasol cream (TEMOVATE) 3.15 % Apply 1 application topically as needed.  . dabigatran (PRADAXA) 150 MG CAPS capsule Take 150 mg by mouth as needed.   . digoxin (LANOXIN) 0.25 MG tablet Take 1 tablet (0.25 mg total) by mouth daily. As needed  . dorzolamide-timolol (COSOPT) 22.3-6.8 MG/ML ophthalmic solution Place 1 drop into the left eye every 12 (twelve) hours.   . flecainide (TAMBOCOR) 50 MG tablet Take 1 tablet (50 mg total) by mouth 2 (two) times daily.  Marland Kitchen latanoprost (XALATAN) 0.005 % ophthalmic solution Place 1 drop into the left eye At bedtime.  . levalbuterol (XOPENEX) 1.25 MG/3ML nebulizer solution Take 1.25 mg by nebulization 4 (four) times daily.  Marland Kitchen levothyroxine (SYNTHROID, LEVOTHROID) 75 MCG tablet TAKE 1 TABLET BY MOUTH DAILY.  Marland Kitchen ondansetron (ZOFRAN) 4 MG tablet Take 1 tablet (4 mg total) by mouth every 8 (eight) hours as needed for nausea or vomiting.   . [DISCONTINUED] levalbuterol (XOPENEX) 1.25 MG/3ML nebulizer solution Take 1.25 mg by nebulization every 4 (four) hours as needed for wheezing.  . [DISCONTINUED] levalbuterol (XOPENEX) 1.25 MG/3ML nebulizer solution Take 1.25 mg by nebulization every 4 (four) hours as needed for wheezing.  Marland Kitchen doxycycline (VIBRA-TABS) 100 MG tablet Take 1 tablet (100 mg total) by mouth 2 (two) times daily.  . mometasone (NASONEX) 50 MCG/ACT nasal spray Place 2 sprays into the nose daily as needed.  . Nebulizers MISC   . predniSONE (DELTASONE) 10 MG tablet Take 40mg  po daily for 3 days, then take 30mg  po daily for 3 days, then take 20mg  po daily for two days, then take 10mg  po daily for 2 days  . [DISCONTINUED] OVER THE COUNTER MEDICATION VST 3-  probiotic 1 capsule daily.   No facility-administered encounter medications on file as of 07/15/2015.

## 2015-07-15 NOTE — Patient Instructions (Addendum)
Take the doxycycline twice a day for 7 days, take this with a probiotic Take the prednisone taper as prescribed Keep using your oxygen at all times with exertion and while sleeping Stop [facility] using Xopenex 4 times a day for the first week, then twice a day for a week, then as needed Keep using Pulmicort twice a day We will see you back in 4-6 weeks or sooner if needed

## 2015-07-16 ENCOUNTER — Telehealth: Payer: Self-pay | Admitting: Pulmonary Disease

## 2015-07-16 ENCOUNTER — Telehealth: Payer: Self-pay

## 2015-07-16 NOTE — Telephone Encounter (Signed)
Pt states she is having afib. States within the last 6 weeks, she has went into afib 5 times. Please call. Not currently in afib now. States she "does have the drugs to fix it, but there is a problem". States she is also having problems with her lungs.

## 2015-07-16 NOTE — Telephone Encounter (Signed)
lmtcb for pt.  

## 2015-07-16 NOTE — Telephone Encounter (Signed)
Spoke w/ pt.   She is sched to see Dr. Rockey Situ tomorrow at 2:40.

## 2015-07-17 ENCOUNTER — Encounter: Payer: Self-pay | Admitting: Cardiovascular Disease

## 2015-07-17 ENCOUNTER — Telehealth: Payer: Self-pay | Admitting: Cardiovascular Disease

## 2015-07-17 ENCOUNTER — Ambulatory Visit (INDEPENDENT_AMBULATORY_CARE_PROVIDER_SITE_OTHER): Payer: Medicare Other | Admitting: Cardiovascular Disease

## 2015-07-17 VITALS — BP 120/68 | HR 76 | Ht 68.0 in | Wt 118.8 lb

## 2015-07-17 DIAGNOSIS — J471 Bronchiectasis with (acute) exacerbation: Secondary | ICD-10-CM | POA: Diagnosis not present

## 2015-07-17 DIAGNOSIS — I4891 Unspecified atrial fibrillation: Secondary | ICD-10-CM | POA: Diagnosis not present

## 2015-07-17 NOTE — Assessment & Plan Note (Signed)
Increasing shortness of breath with management as above. No prior echocardiogram. This was discussed with her. If symptoms do not improve as expected, could consider echocardiogram to rule out other etiology such as pulmonary hypertension She will call us if she would like to set this up

## 2015-07-17 NOTE — Telephone Encounter (Signed)
CXR was OK I'm OK with using Xopenex inhaler qid, but probably not as good as the nebulizer.  OK to call in if she still wants it.  She can take the Xopenex when she takes the budesonide if that helps, then she only needs to use it 4 times a day

## 2015-07-17 NOTE — Assessment & Plan Note (Signed)
Paroxysmal atrial fibrillation, increasing frequency of episodes over the past several weeks, possibly related to shortness of breath, hypoxia. She will continue her current regimen for now. If she continues to have episodes, could potentially increase flecainide dosing up to 75 mg twice a day, even 100 mg twice a day. For now she will continue anticoagulation as needed for each episode If frequency continues to run high, could consider taking anticoagulation daily

## 2015-07-17 NOTE — Assessment & Plan Note (Signed)
Recent exacerbation of her symptoms. Managed by pulmonary, on ABX and prednisone taper

## 2015-07-17 NOTE — Telephone Encounter (Signed)
Patient wants to know what PO means.

## 2015-07-17 NOTE — Progress Notes (Signed)
Patient ID: Hannah Kline, female    DOB: 1932-07-16, 81 y.o.   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 history of C. Difficile in genera 2015 after treatment with Cipro for bronchiectasis Symptoms resolved with Flagyl 2  In follow-up today, she reports having increasing shortness of breath over the past several weeks. Recently seen by pulmonary, Dr. Waunita Schooner. Started on ABX and prednisone taper. Only done one day yet, no significant improvement. Chest x-ray done which shows stable COPD, pulmonary fibrosis, bronchiectasis. Results discussed with her. She reports having 5 episodes of atrial fibrillation over the past several weeks. She thinks possibly from increasing shortness of breath exacerbating her arrhythmia. For her atrial fibrillation, she takes digoxin, extra flecainide and typically heart rate will go back to normal sinus rhythm Weight is stable, adequate good appetite  EKG on today's visit shows normal sinus rhythm with rate 76 bpm, left anterior fascicular block  Past medical history In the past, she has failed sotalol. Amiodarone was discontinued given underlying lung disease. She was started on  Flecainide 50 mg twice a day with good success.  Episode of atrial fibrillation on April 27th to the 29th 2014. She came to the office, had an EKG. She took digoxin x2,  extra flecainide. This was the longest episode of atrial fibrillation she has had. She is symptomatic in atrial fibrillation. She was also given pradaxa and took his twice a day.   Other  Episodes of tachycardia in July 2013, October 2013.      Allergies  Allergen Reactions  . Isoniazid     unknown  . Moxifloxacin     Avelox  REACTION: chills, fainting    Current Outpatient  Prescriptions on File Prior to Visit  Medication Sig Dispense Refill  . budesonide (PULMICORT) 0.5 MG/2ML nebulizer solution Take 2 mLs (0.5 mg total) by nebulization 2 (two) times daily. 120 mL 11  . clobetasol cream (TEMOVATE) 1.74 % Apply 1 application topically as needed.  2  . dabigatran (PRADAXA) 150 MG CAPS capsule Take 150 mg by mouth as needed.     . digoxin (LANOXIN) 0.25 MG tablet Take 1 tablet (0.25 mg total) by mouth daily. As needed 90 tablet 3  . dorzolamide-timolol (COSOPT) 22.3-6.8 MG/ML ophthalmic solution Place 1 drop into the left eye every 12 (twelve) hours.     Marland Kitchen doxycycline (VIBRA-TABS) 100 MG tablet Take 1 tablet (100 mg total) by mouth 2 (two) times daily. 14 tablet 0  . flecainide (TAMBOCOR) 50 MG tablet Take 1 tablet (50 mg total) by mouth 2 (two) times daily. 60 tablet 3  . latanoprost (XALATAN) 0.005 % ophthalmic solution Place 1 drop into the left eye At bedtime.    Marland Kitchen levothyroxine (SYNTHROID, LEVOTHROID) 75 MCG tablet TAKE 1 TABLET BY MOUTH DAILY. 90 tablet 3  . mometasone (NASONEX) 50 MCG/ACT nasal spray Place 2 sprays into the nose daily as needed. 17 g 5  . Nebulizers MISC     . ondansetron (ZOFRAN) 4 MG tablet Take 1 tablet (4 mg total) by mouth every 8 (eight) hours as needed for nausea or vomiting. 30 tablet 3  . predniSONE (DELTASONE) 10 MG tablet Take 40mg  po daily for 3 days, then take 30mg  po daily for 3 days, then take 20mg  po daily for two  days, then take 10mg  po daily for 2 days 27 tablet 0  . levalbuterol (XOPENEX) 1.25 MG/3ML nebulizer solution Take 1.25 mg by nebulization 4 (four) times daily. (Patient not taking: Reported on 07/17/2015) 72 mL 2   No current facility-administered medications on file prior to visit.    Past Medical History  Diagnosis Date  . Atrial fibrillation   . History of Mycobacterium avium complex infection     lung disease  . COPD (chronic obstructive pulmonary disease)    . Bronchiectasis   . Hepatitis     h/o INH  .  Bacterial infection due to Pseudomonas   . Pleurisy 12/08/11    Past Surgical History  Procedure Laterality Date  . Shoulder arthroscopy      right    Social History  reports that she has never smoked. She has never used smokeless tobacco. She reports that she drinks alcohol. She reports that she does not use illicit drugs.  Family History family history includes Angina in her mother; Diabetes in her maternal grandfather; Other in her mother.  Review of Systems  Constitutional: Negative.   Respiratory: Positive for cough and shortness of breath.   Cardiovascular: Negative.   Gastrointestinal: Negative.   Musculoskeletal: Negative.   Skin: Negative.   Neurological: Negative.   Psychiatric/Behavioral: Negative.   All other systems reviewed and are negative.    BP 120/68 mmHg  Pulse 76  Ht 5\' 8"  (1.727 m)  Wt 118 lb 12 oz (53.865 kg)  BMI 18.06 kg/m2  Physical Exam  Constitutional: She is oriented to person, place, and time. She appears well-developed and well-nourished.  HENT:  Head: Normocephalic.  Nose: Nose normal.  Mouth/Throat: Oropharynx is clear and moist.  Eyes: Conjunctivae are normal. Pupils are equal, round, and reactive to light.  Neck: Normal range of motion. Neck supple. No JVD present.  Cardiovascular: Normal rate, regular rhythm, S1 normal, S2 normal, normal heart sounds and intact distal pulses.  Exam reveals no gallop and no friction rub.   No murmur heard. Pulmonary/Chest: Effort normal. No respiratory distress. She has decreased breath sounds. She has no wheezes. She has rales. She exhibits no tenderness.  Abdominal: Soft. Bowel sounds are normal. She exhibits no distension. There is no tenderness.  Musculoskeletal: Normal range of motion. She exhibits no edema or tenderness.  Lymphadenopathy:    She has no cervical adenopathy.  Neurological: She is alert and oriented to person, place, and time. Coordination normal.  Skin: Skin is warm and dry. No  rash noted. No erythema.  Psychiatric: She has a normal mood and affect. Her behavior is normal. Judgment and thought content normal.    Assessment and Plan  Nursing note and vitals reviewed.

## 2015-07-17 NOTE — Telephone Encounter (Signed)
Called spoke with pt. She reports Dr. Lake Bells ordered for xopenex neb QID for her. She does not want this bc she is already on budesonide BID. She doesn't want to use her nebulizer 6 times a day. She is wanting rescue inhaler [facility]openex [facility].  Also pt wants CXR results  Please advise Dr. Lake Bells thanks

## 2015-07-17 NOTE — Patient Instructions (Signed)
You are doing well. No medication changes were made.  Please call id shortness of breath does not go back to baseline, Echocardiogram could be done Call the office if needed  Please call us if you have new issues that need to be addressed before your next appt.  Your physician wants you to follow-up in: 6 months.  You will receive a reminder letter in the mail two months in advance. If you don't receive a letter, please call our office to schedule the follow-up appointment.

## 2015-07-17 NOTE — Telephone Encounter (Signed)
ATC pt. Line rang several times with no VM. WCB.

## 2015-07-18 DIAGNOSIS — C44712 Basal cell carcinoma of skin of right lower limb, including hip: Secondary | ICD-10-CM | POA: Diagnosis not present

## 2015-07-18 MED ORDER — LEVALBUTEROL TARTRATE 45 MCG/ACT IN AERO: 2.0000 | INHALATION_SPRAY | Freq: Four times a day (QID) | RESPIRATORY_TRACT | Status: DC

## 2015-07-18 NOTE — Telephone Encounter (Signed)
Spoke with pt and advised of cxr results per Dr Lake Bells.  Advised of Dr Anastasia Pall recommendations regarding Xopenex. Pt states she would still like to do the Xopenex inhaler instead of the nebulizer form.  [facility] per pt request.

## 2015-07-21 ENCOUNTER — Telehealth: Payer: Self-pay | Admitting: Pulmonary Disease

## 2015-07-21 ENCOUNTER — Encounter: Payer: Self-pay | Admitting: Internal Medicine

## 2015-07-21 ENCOUNTER — Ambulatory Visit (INDEPENDENT_AMBULATORY_CARE_PROVIDER_SITE_OTHER): Payer: Medicare Other | Admitting: Internal Medicine

## 2015-07-21 VITALS — BP 122/70 | HR 73 | Temp 98.1°F | Ht 68.0 in | Wt 118.0 lb

## 2015-07-21 DIAGNOSIS — R0902 Hypoxemia: Secondary | ICD-10-CM

## 2015-07-21 DIAGNOSIS — J471 Bronchiectasis with (acute) exacerbation: Secondary | ICD-10-CM

## 2015-07-21 DIAGNOSIS — Z9981 Dependence on supplemental oxygen: Secondary | ICD-10-CM

## 2015-07-21 MED ORDER — FLUTTER DEVI: Status: DC

## 2015-07-21 NOTE — Patient Instructions (Signed)
mucinex 1200 mg every 12 hours as needed for cough and congestion and use the flutter as much as you can   Try prilosec otc 20mg   Take 30-60 min before first meal of the day and Pepcid ac (famotidine) 20 mg one @  bedtime until cough is completely gone for at least a week without the need for cough suppression   GERD (REFLUX)  is an extremely common cause of respiratory symptoms just like yours , many times with no obvious heartburn at all.    It can be treated with medication, but also with lifestyle changes including elevation of the head of your bed (ideally with 6 inch  bed blocks),  Smoking cessation, avoidance of late meals, excessive alcohol, and avoid fatty foods, chocolate, peppermint, colas, red wine, and acidic juices such as orange juice.  NO MINT OR MENTHOL PRODUCTS SO NO COUGH DROPS  USE SUGARLESS CANDY INSTEAD (Jolley ranchers or Stover's or Life Savers) or even ice chips will also do - the key is to swallow to prevent all throat clearing. NO OIL BASED VITAMINS - use powdered substitutes.   Finish finish doxy and call if mucus gets nastier or you run fever  Brovana twice daily mixed with budesonide twice daily per nebulizer   Only use your xopenex as a rescue medication to be used if you can't catch your breath by resting or doing a relaxed purse lip breathing pattern.  - The less you use it, the better it will work when you need it. - Ok to use up to 2 puffs  every 4 hours if you must but call for immediate appointment if use goes up over your usual need - Don't leave home without it !!  (think of it like the spare tire for your car)    Talk to your eye doctor about using an alternative to timoptic when you having respiratory problems

## 2015-07-21 NOTE — Telephone Encounter (Signed)
Appt made with Dr. Melvyn Novas at 1330 on 9.12.16. Called and made pt aware of appt time and date. Pt verbalized understanding and denied any further questions or concerns at this time.   Will forward to BQ as FYI.

## 2015-07-21 NOTE — Progress Notes (Signed)
Subjective:    Patient ID: Hannah Kline, female    DOB: Feb 02, 1932, 81 y.o.   MRN: [id number] Synopsis: Hannah Kline established care with the [facility] pulmonary office in February 2013 for bronchiectasis due to [place]. She was diagnosed at age 36 in [place]. She was treated with rifampin ethambutol and clarithromycin for 2 years. Since then she does not believe she was ever grown out MAI.  She had c.diff colitis in 2015 which was refractory to treatment.      HPI Dr Lake Bells:  07/15/15  Chief Complaint  Patient presents with  . Follow-up    pt states she has increase of SOB and fatigued since the beginnig of the summer. pt is limited to activity. pt has a productive cough greyish in color.  pt has lots of nausea / vomitting at times.    Hannah Kline says that her dyspnea is "terrible" compared to the last visit.  She feels more dyspneic since the last visit.  She feels tired all the time, weak, and feel bad overall.  She has been using the brovana only once a day because she doesn't like it.  She coughs daily and produces mucus every day.  No change to the color or volume of the mucus.  No pain anywhere.  No resting dyspnea, only exertional.  She has dyspnea when getting up in the middle of the night. rec Take the doxycycline twice a day for 7 days, take this with a probiotic Take the prednisone taper as prescribed Keep using your oxygen at all times with exertion and while sleeping Stop [facility] using Xopenex 4 times a day for the first week, then twice a day for a week, then as needed Keep using Pulmicort twice a day   07/21/2015 acute extended ov/[facility] [place]man re: bronchiectasis Chief Complaint  Patient presents with  . Acute Visit    Pt c/o increase in SOB with no activity (audibly dyspneic on phone), prod cough with tan mucus x 2 days. Pt states she is still taking the pred and doxy since 9.7.2016. Pt's SpO2 was 93% on 3lpm and HR was 85. Pt A & O, denies f/c/s,  swelling, CP/tightness. Pt stated she has noticed a worsening in SOB in the last 2 days.  Onset worse sob was summer   has flutter valve not using/ breathing worse off brovana since last ov no better on pred and doxy with severe cough   No obvious day to day or daytime variability or assoc  cp or chest tightness, subjective wheeze or overt sinus or hb symptoms. No unusual exp hx or h/o childhood pna/ asthma or knowledge of premature birth.   . Also denies any obvious fluctuation of symptoms with weather or environmental changes or other aggravating or alleviating factors except as outlined above   Current Medications, Allergies, Complete Past Medical History, Past Surgical History, Family History, and Social History were reviewed in [employer] record.  ROS  The following are not active complaints unless bolded sore throat, dysphagia, dental problems, itching, sneezing,  nasal congestion or excess/ purulent secretions, ear ache,   fever, chills, sweats, unintended wt loss, classically pleuritic or exertional cp, hemoptysis,  orthopnea pnd or leg swelling, presyncope, palpitations, abdominal pain, anorexia, nausea, vomiting, diarrhea  or change in bowel or bladder habits, change in stools or urine, dysuria,hematuria,  rash, arthralgias, visual complaints, headache, numbness, weakness or ataxia or problems with walking or coordination,  change in mood/affect or memory.  Past Medical History  Diagnosis Date  . Atrial fibrillation   . History of Mycobacterium avium complex infection     lung disease  . COPD (chronic obstructive pulmonary disease)    . Bronchiectasis   . Hepatitis     h/o INH  . Bacterial infection due to Pseudomonas   . Pleurisy 12/08/11     Review of Systems  Constitutional: Negative for fever, chills, appetite change, fatigue and unexpected weight change.  HENT: Negative for congestion, ear pain and nosebleeds.   Respiratory:  Negative for cough, choking and shortness of breath.   Cardiovascular: Negative for chest pain and leg swelling.       Objective:   Physical Exam   amb wf nad  Wt Readings from Last 3 Encounters:  07/21/15 118 lb (53.524 kg)  07/17/15 118 lb 12 oz (53.865 kg)  07/15/15 118 lb 12.8 oz (53.887 kg)    Vital signs reviewed   Gen: well appearing, no acute distress HEENT: NCAT, EOMi, OP clear,  PULM: insp and exp rhonchi bilaterally  CV: Irreg irreg, no mgr, no JVD AB: BS+, soft, nontender, no hsm Ext: warm, no edema, no clubbing, no cyanosis  Review of 2012 sputum microbiology: Pan sensitive pseudomonas  09/2010 Full PFT: Ratio 63%, FEV1 1.49L (67% pred)  10/2011 CT Chest: impressive upper lobe cystic bronchiectasis, scattered lower lobe tree-in-bud abnormalities and scattered nodules; One RML solid nodule measures 7.31mm  January 2013 CT chest [facility]: Stable upper lobe cystic bronchiectasis also present in the right middle lobe. Scattered tree in bud abnormalities and nodules throughout all lung fields roughly unchanged from prior. Right middle lobe nodule is no longer visible but there is an 4mm nodule in the right upper lobe.  December 2013 simple spirometry FEV1 0.8 L  10/11/2012 Sputum: PSEUDOMONAS AERUGINOSA       Antibiotic  Sensitivity  Microscan  Status    CEFEPIME  Sensitive  2  Final    CEFTAZIDIME  Sensitive  <=1  Final    CIPROFLOXACIN  Intermediate  2  Final    GENTAMICIN  Sensitive  <=1  Final    IMIPENEM  Sensitive  1  Final    LEVOFLOXACIN  Intermediate  4  Final    PIP/TAZO  Sensitive  <=4  Final    TOBRAMYCIN  Sensitive  <=1  Final    January 2014 simple spirometry >> not reproducible by ATS standards  January 2014 Sputum AFB > neg x3 11/2013 FEV1 0.93 L  2015 Sputum> Pseudomonas , Cipro/Levofloxacin Intermediate suscept; Cefepime,Ceftaz, Imi, Pip-Tazo, Gent all susceptible  September 2015 sputum culture> Klebsiella resistant to amoxicillin but  susceptible to Augmentin and multiple other agents   .  I personally reviewed images and agree with radiology impression as follows:  CXR:   07/15/15  Stable chronic changes of fibrosis and COPD.     Assessment & Plan:

## 2015-07-21 NOTE — Telephone Encounter (Signed)
See if there is an opening for an acute OV in [place].  I think she needs to be seen.  She may even needs a hospital admission

## 2015-07-21 NOTE — Telephone Encounter (Signed)
Called and spoke to pt. Pt c/o increase in SOB with no activity (audibly dyspneic on phone), prod cough with tan mucus x 2 days. Pt states she is still taking the pred and doxy since 9.7.2016. Pt's SpO2 was 93% on 3lpm and HR was 85. Pt A & O, denies f/c/s, swelling, CP/tightness. Pt stated she has noticed a worsening in SOB in the last 2 days. There are no openings for OV today.   Dr. Lake Bells please advise. Thanks.

## 2015-07-23 ENCOUNTER — Encounter: Payer: Self-pay | Admitting: Internal Medicine

## 2015-07-23 NOTE — Assessment & Plan Note (Addendum)
Diagnosed with MAI related bronchiectasis at age 81 in [place], [place].. Treated for MAI for two years at [place] ([place]).  PFT's 2011:  FEV1 1.49 (67%), ratio 63, TLC 76%, DLCO 93% 10/2012 Spiro > FEV1 0.82 L (35% pred)  10/2012 Sputum> PSA  10/2012 home found to have mold : Alterineria and Claudosporium Sp  Sputum 06/2011:  No AFB at 6weeks. 10/2012 AFB sputum >> negative Sputum 11/2012: No AFB 6 weeks  +exertional desats:  Pt does not want to wear oxygen.  40mw testing 10/2011:  448 meters, low sat 90% ONO 10/2011:  Low sat 85%, only 3 min less than 88% (doesn't need nocturnal oxygen)  Allergy Profile 02/02/2013- NEG- total IgE 2.0 with no specific elevations of any allergens including mold allergen panel.  04/2013 6MW 1490 feet, O2 saturation 85% RA 11/2013 FEV1 0.93 L 11/2012 Ambulated 500 feet on 2LNC, O2 saturation 95%  2015 Sputum> Pseudomonas , Cipro/Levofloxacin Intermediate suscept; Cefepime,Ceftaz, Imi, Pip-Tazo, Gent all susceptible  September 2015 sputum culture> Klebsiella resistant to amoxicillin but susceptible to Augmentin and multiple other agents  January 2016 sputum culture testing> negative for AFB 3   Her symptoms are very difficult to control and worse off brovana.  DDX of  difficult airways management all start with A and  include Adherence, Ace Inhibitors, Acid Reflux, Active Sinus Disease, Alpha 1 Antitripsin deficiency, Anxiety masquerading as Airways dz,  ABPA,  allergy(esp in young), Aspiration (esp in elderly), Adverse effects of meds,  Active smokers, A bunch of PE's (a small clot burden can't cause this syndrome unless there is already severe underlying pulm or vascular dz with poor reserve) plus two Bs  = Bronchiectasis and Beta blocker use..and one C= CHF   Adherence is always the initial "prime suspect" and is a multilayered concern that requires a "trust but verify" approach in every patient - starting with knowing how to use medications,  especially inhalers, correctly, keeping up with refills and understanding the fundamental difference between maintenance and prns vs those medications only taken for a very short course and then stopped and not refilled.   ? Acid (or non-acid) GERD > always difficult to exclude as up to 75% of pts in some series report no assoc GI/ Heartburn symptoms> rec max (24h)  acid suppression and diet restrictions/ reviewed and instructions given in writing.   ? Active sinus dz > finish abx  ? Allergy component > finish pred rx  ? Active smoker > says she never smoker   Bronciectasis >  Since never smoked and has airflow obst on pfts should be treated as obstructive bronchiectasis similar but certainly not the same as copd >  Bud/laba approp choice  ? Related to BB > note how much worse she is off brovana and on timoptic eyedrops so rec resume brovana and consider substitute for betoptic   I had an extended discussion with the patient reviewing all relevant studies completed to date and  lasting 15 to 20 minutes of a 25 minute visit    Each maintenance medication was reviewed in detail including most importantly the difference between maintenance and prns and under what circumstances the prns are to be triggered using an action plan format that is not reflected in the computer generated alphabetically organized AVS.    Please see instructions for details which were reviewed in writing and the patient given a copy highlighting the part that I personally wrote and discussed at today's ov.

## 2015-07-23 NOTE — Assessment & Plan Note (Addendum)
2L with exertion and hs  02/2015 ONO RA > O2 saturation dropped < 88% for 46 min  Adequate control on present rx, reviewed > no change in rx needed

## 2015-08-22 ENCOUNTER — Ambulatory Visit: Payer: Medicare Other | Admitting: Pulmonary Disease

## 2015-08-26 ENCOUNTER — Encounter: Payer: Self-pay | Admitting: *Deleted

## 2015-08-26 DIAGNOSIS — H401492 Capsular glaucoma with pseudoexfoliation of lens, unspecified eye, moderate stage: Secondary | ICD-10-CM | POA: Diagnosis not present

## 2015-08-28 ENCOUNTER — Encounter: Payer: Self-pay | Admitting: Pulmonary Disease

## 2015-08-28 ENCOUNTER — Ambulatory Visit (INDEPENDENT_AMBULATORY_CARE_PROVIDER_SITE_OTHER): Payer: Medicare Other | Admitting: Pulmonary Disease

## 2015-08-28 VITALS — BP 130/64 | HR 63 | Temp 98.0°F | Ht 68.0 in | Wt 118.0 lb

## 2015-08-28 DIAGNOSIS — J471 Bronchiectasis with (acute) exacerbation: Secondary | ICD-10-CM | POA: Diagnosis not present

## 2015-08-28 NOTE — Patient Instructions (Signed)
Keep taking your medications as you're doing Keep taking  Brovana and Pulmicort twice a day Use the flutter valve twice a day Use your oxygen when you exert herself and when you sleep We will see you back in February or sooner if needed

## 2015-08-28 NOTE — Assessment & Plan Note (Signed)
This has been a stable interval for [place]. She resumed Brovana and has done well with that. However, as usual she has a myriad of questions about why she is afflicted with this disease. I spent a significant amount of time (greater than 25 minutes of face-to-face time) explaining again that she will have a cough every day of her life. She will produce mucus every day of her life. She does have progression of her bronchiectasis. We have surveyed for MAI and have not seen that as recent as this year. I think we should check sputum for AFB once a year.  For now, I want her to stay active, continue with mucociliary clearance measures, continue bronchodilators as prescribed, and do everything she can do keep her weight up with nutrition.  Plan: Continue Brovana Continue Pulmicort Continue flutter valve twice a day Continue oxygen with exertion We will plan on checking a sputum AFB when she comes back in 2017 Follow-up in February 2017 or sooner if needed

## 2015-08-28 NOTE — Progress Notes (Signed)
Subjective:    Patient ID: Hannah Kline, female    DOB: 11-Apr-1932, 81 y.o.   MRN: [id number] Synopsis: Hannah Kline established care with the [facility] pulmonary office in February 2013 for bronchiectasis due to Hannah Kline. She was diagnosed at age 64 in [place]. She was treated with rifampin ethambutol and clarithromycin for 2 years. Since then she does not believe she was ever grown out MAI.  She had c.diff colitis in 2015 which was refractory to treatment.    HPI  Chief Complaint  Patient presents with  . Follow-up    pt c/o good and bad days.  today pt c/o weakness, loss of appetite, shortness of breath, nausea.     Doxycyline helped when we prescribed it. She was feeling terrible back then, but now she is feeling better. She is using the oxygen a lot more when walking around and at night. She has actually been feeling fine until today when she started to have nausea, weak and loose stools (just a little today). She is just frustrated with her symptoms. She also started on the brovana and felt better. She is frustrated with the progression of her disease.   Past Medical History  Diagnosis Date  . Atrial fibrillation (Elizabeth Kline)   . History of Mycobacterium avium complex infection     lung disease  . COPD (chronic obstructive pulmonary disease) ([place])    . Bronchiectasis   . Hepatitis     h/o INH  . Bacterial infection due to Pseudomonas   . Pleurisy 12/08/11     Review of Systems  Constitutional: Negative for fever, chills, appetite change, fatigue and unexpected weight change.  HENT: Negative for congestion, ear pain and nosebleeds.   Respiratory: Negative for cough, choking and shortness of breath.   Cardiovascular: Negative for chest pain and leg swelling.       Objective:   Physical Exam Filed Vitals:   08/28/15 1407  BP: 130/64  Pulse: 63  Temp: 98 F (36.7 C)  TempSrc: Oral  Height: '5\' 8"'  (1.727 m)  Weight: 118 lb (53.524 kg)  SpO2:  94%   2L   Gen: well appearing, no acute distress HEENT: NCAT, EOMi, OP clear,  PULM: few crackles today, good air movement, no wheezing CV: Irreg irreg, no mgr, no JVD AB: BS+, soft, nontender, no hsm Ext: warm, no edema, no clubbing, no cyanosis  Review of 2012 sputum microbiology: Pan sensitive pseudomonas  09/2010 Full PFT: Ratio 63%, FEV1 1.49L (67% pred)  10/2011 CT Chest: impressive upper lobe cystic bronchiectasis, scattered lower lobe tree-in-bud abnormalities and scattered nodules; One RML solid nodule measures 7.25m  January 2013 CT chest [facility]: Stable upper lobe cystic bronchiectasis also present in the right middle lobe. Scattered tree in bud abnormalities and nodules throughout all lung fields roughly unchanged from prior. Right middle lobe nodule is no longer visible but there is an 827mnodule in the right upper lobe.  December 2013 simple spirometry FEV1 0.8 L  10/11/2012 Sputum: PSEUDOMONAS AERUGINOSA       Antibiotic  Sensitivity  Microscan  Status    CEFEPIME  Sensitive  2  Final    CEFTAZIDIME  Sensitive  <=1  Final    CIPROFLOXACIN  Intermediate  2  Final    GENTAMICIN  Sensitive  <=1  Final    IMIPENEM  Sensitive  1  Final    LEVOFLOXACIN  Intermediate  4  Final    PIP/TAZO  Sensitive  <=4  Final    TOBRAMYCIN  Sensitive  <=1  Final    January 2014 simple spirometry >> not reproducible by ATS standards  January 2014 Sputum AFB > neg x3 11/2013 FEV1 0.93 L  2015 Sputum> Pseudomonas , Cipro/Levofloxacin Intermediate suscept; Cefepime,Ceftaz, Imi, Pip-Tazo, Gent all susceptible  September 2015 sputum culture> Klebsiella resistant to amoxicillin but susceptible to Augmentin and multiple other agents  January - February 2016 Sputum AFB negative x 3     Assessment & Plan:   BRONCHIECTASIS This has been a stable interval for Hannah Kline. She resumed Brovana and has done well with that. However, as usual she has a myriad of questions about why she is afflicted  with this disease. I spent a significant amount of time (greater than 25 minutes of face-to-face time) explaining again that she will have a cough every day of her life. She will produce mucus every day of her life. She does have progression of her bronchiectasis. We have surveyed for MAI and have not seen that as recent as this year. I think we should check sputum for AFB once a year.  For now, I want her to stay active, continue with mucociliary clearance measures, continue bronchodilators as prescribed, and do everything she can do keep her weight up with nutrition.  Plan: Continue Brovana Continue Pulmicort Continue flutter valve twice a day Continue oxygen with exertion We will plan on checking a sputum AFB when she comes back in 2017 Follow-up in February 2017 or sooner if needed    Updated Medication List Outpatient Encounter Prescriptions as of 08/28/2015  Medication Sig  . arformoterol (BROVANA) 15 MCG/2ML NEBU Take 15 mcg by nebulization 2 (two) times daily.  . budesonide (PULMICORT) 0.5 MG/2ML nebulizer solution Take 2 mLs (0.5 mg total) by nebulization 2 (two) times daily.  . clobetasol cream (TEMOVATE) 7.37 % Apply 1 application topically as needed.  . dabigatran (PRADAXA) 150 MG CAPS capsule Take 150 mg by mouth as needed.   . digoxin (LANOXIN) 0.25 MG tablet Take 1 tablet (0.25 mg total) by mouth daily. As needed  . dorzolamide-timolol (COSOPT) 22.3-6.8 MG/ML ophthalmic solution Place 1 drop into the left eye every 12 (twelve) hours.   . flecainide (TAMBOCOR) 50 MG tablet Take 1 tablet (50 mg total) by mouth 2 (two) times daily.  Marland Kitchen latanoprost (XALATAN) 0.005 % ophthalmic solution Place 1 drop into the left eye At bedtime.  . levalbuterol (XOPENEX HFA) 45 MCG/ACT inhaler Inhale 2 puffs into the lungs 4 (four) times daily.  Marland Kitchen levothyroxine (SYNTHROID, LEVOTHROID) 75 MCG tablet TAKE 1 TABLET BY MOUTH DAILY.  . Nebulizers MISC   . ondansetron (ZOFRAN) 4 MG tablet Take 1 tablet  (4 mg total) by mouth every 8 (eight) hours as needed for nausea or vomiting.  Marland Kitchen Respiratory Therapy Supplies (FLUTTER) DEVI Use as directed  . mometasone (NASONEX) 50 MCG/ACT nasal spray Place 2 sprays into the nose daily as needed.  . [DISCONTINUED] doxycycline (VIBRA-TABS) 100 MG tablet Take 1 tablet (100 mg total) by mouth 2 (two) times daily. (Patient not taking: Reported on 08/28/2015)  . [DISCONTINUED] levalbuterol (XOPENEX) 1.25 MG/3ML nebulizer solution Take 1.25 mg by nebulization 4 (four) times daily. (Patient not taking: Reported on 08/28/2015)  . [DISCONTINUED] predniSONE (DELTASONE) 10 MG tablet Take 22m po daily for 3 days, then take 389mpo daily for 3 days, then take 2038mo daily for two days, then take 67m13m daily for 2 days (Patient not taking: Reported on 08/28/2015)  No facility-administered encounter medications on file as of 08/28/2015.

## 2015-09-02 DIAGNOSIS — H401492 Capsular glaucoma with pseudoexfoliation of lens, unspecified eye, moderate stage: Secondary | ICD-10-CM | POA: Diagnosis not present

## 2015-09-04 DIAGNOSIS — Z23 Encounter for immunization: Secondary | ICD-10-CM | POA: Diagnosis not present

## 2015-09-27 ENCOUNTER — Other Ambulatory Visit: Payer: Self-pay | Admitting: Cardiovascular Disease

## 2015-10-06 DIAGNOSIS — Z85828 Personal history of other malignant neoplasm of skin: Secondary | ICD-10-CM | POA: Diagnosis not present

## 2015-10-06 DIAGNOSIS — Z85820 Personal history of malignant melanoma of skin: Secondary | ICD-10-CM | POA: Diagnosis not present

## 2015-10-06 DIAGNOSIS — Z1283 Encounter for screening for malignant neoplasm of skin: Secondary | ICD-10-CM | POA: Diagnosis not present

## 2015-10-06 DIAGNOSIS — D229 Melanocytic nevi, unspecified: Secondary | ICD-10-CM | POA: Diagnosis not present

## 2015-10-06 DIAGNOSIS — L820 Inflamed seborrheic keratosis: Secondary | ICD-10-CM | POA: Diagnosis not present

## 2015-10-06 DIAGNOSIS — L578 Other skin changes due to chronic exposure to nonionizing radiation: Secondary | ICD-10-CM | POA: Diagnosis not present

## 2015-10-06 DIAGNOSIS — D1800 Hemangioma unspecified site: Secondary | ICD-10-CM | POA: Diagnosis not present

## 2015-10-06 DIAGNOSIS — C44519 Basal cell carcinoma of skin of other part of trunk: Secondary | ICD-10-CM | POA: Diagnosis not present

## 2015-11-11 DIAGNOSIS — H401492 Capsular glaucoma with pseudoexfoliation of lens, unspecified eye, moderate stage: Secondary | ICD-10-CM | POA: Diagnosis not present

## 2015-11-26 ENCOUNTER — Other Ambulatory Visit: Payer: Self-pay | Admitting: Internal Medicine

## 2015-12-04 ENCOUNTER — Ambulatory Visit (INDEPENDENT_AMBULATORY_CARE_PROVIDER_SITE_OTHER): Payer: Medicare Other | Admitting: Family Medicine

## 2015-12-04 ENCOUNTER — Telehealth: Payer: Self-pay

## 2015-12-04 ENCOUNTER — Encounter: Payer: Self-pay | Admitting: Family Medicine

## 2015-12-04 VITALS — BP 124/62 | HR 63 | Temp 98.0°F | Ht 68.0 in | Wt 120.6 lb

## 2015-12-04 DIAGNOSIS — R059 Cough, unspecified: Secondary | ICD-10-CM

## 2015-12-04 DIAGNOSIS — J471 Bronchiectasis with (acute) exacerbation: Secondary | ICD-10-CM

## 2015-12-04 DIAGNOSIS — R05 Cough: Secondary | ICD-10-CM

## 2015-12-04 MED ORDER — PREDNISONE 20 MG PO TABS: 40.0000 mg | ORAL_TABLET | Freq: Every day | ORAL | Status: DC

## 2015-12-04 MED ORDER — DOXYCYCLINE HYCLATE 100 MG PO TABS
100.0000 mg | ORAL_TABLET | Freq: Two times a day (BID) | ORAL | Status: DC
Start: 2015-12-04 — End: 2015-12-30

## 2015-12-04 NOTE — Progress Notes (Signed)
Patient ID: Elonda Krut, female   DOB: December 10, 1931, 82 y.o.   MRN: [id number]  Tommi Rumps, MD Phone: [phone]  Hannah Kline is a 81 y.o. female who presents today for same-day visit.  Patient reports starting earlier this week she developed cough that she describes as a "hard cough" that is nonproductive. She notes sore throat with this. She denies any sinus issues or postnasal drip. No fevers. She notes she lost her voice last night. She's been more tired than usual and sleeping more. She denies chest pain. She notes her shortness of breath from her bronchiectasis is at her baseline. She states she typically wears oxygen when doing any activity. The only time she does not wear it is when she is resting. She typically wears 2 L. Typically her oxygen is in the mid 90s at home on her oxygen. She did not bring her oxygen with her today. She has been using her nebulizers at home and they help with the cough.  PMH: nonsmoker.   ROS see history of present illness  Objective  Physical Exam Filed Vitals:   12/04/15 1549  BP: 124/62  Pulse: 63  Temp: 98 F (36.7 C)   Ambulatory oxygen 92% on 2 L nasal cannula  Physical Exam  Constitutional: She is well-developed, well-nourished, and in no distress.  HENT:  Head: Normocephalic and atraumatic.  Right Ear: External ear normal.  Left Ear: External ear normal.  Mouth/Throat: No oropharyngeal exudate.  Mild posterior oropharyngeal erythema, normal TMs bilaterally  Eyes: Conjunctivae are normal. Pupils are equal, round, and reactive to light.  Neck: Neck supple.  Cardiovascular: Exam reveals no gallop and no friction rub.   No murmur heard. Irregular rhythm, rate controlled  Pulmonary/Chest: Effort normal. No respiratory distress.  Scattered expiratory wheezes, diffuse crackles, good air movement  Lymphadenopathy:    She has no cervical adenopathy.  Neurological: She is alert. Gait normal.  Skin: Skin is warm and dry. She is not  diaphoretic.     Assessment/Plan: Please see individual problem list.  Bronchiectasis with acute exacerbation Patient with bronchiectasis and increasing cough. Cough is nonproductive and her shortness of breath is at baseline. This could be exacerbation of her bronchiectasis, though would expect productive cough and increased shortness of breath with this. Could also be other viral illness. She has no focal findings on exam to indicate bacterial illness. Her oxygen was in the lower range of normal prior to being placed on oxygen by nasal cannula. She notes she typically wears this when doing any activities. We placed her on 2 L nasal cannula and oxygen improved to 96%. We'll obtain a chest x-ray to evaluate for underlying process. We will start her on prednisone given the wheezing. She is given a prescription for doxycycline only to fill if she develops fevers or productive cough. If she fills the antibiotic she is to call our office and let us know. She is advised to wear her home O2 as directed. She is advised to check her oxygen and if it drops below 90% on oxygen she should seek medical attention. She is given return precautions.    Orders Placed This Encounter  Procedures  . DG Chest 2 View    Standing Status: Future     Number of Occurrences:      Standing Expiration Date: 01/31/2017    Order Specific Question:  Reason for Exam (SYMPTOM  OR DIAGNOSIS REQUIRED)    Answer:  cough increased, history of bronchiectasis    Order  Specific Question:  Preferred imaging location?    Answer:  [facility] ordered this encounter  Medications  . predniSONE (DELTASONE) 20 MG tablet    Sig: Take 2 tablets (40 mg total) by mouth daily with breakfast.    Dispense:  10 tablet    Refill:  0  . doxycycline (VIBRA-TABS) 100 MG tablet    Sig: Take 1 tablet (100 mg total) by mouth 2 (two) times daily.    Dispense:  14 tablet    Refill:  0    Tommi Rumps

## 2015-12-04 NOTE — Assessment & Plan Note (Signed)
Patient with bronchiectasis and increasing cough. Cough is nonproductive and her shortness of breath is at baseline. This could be exacerbation of her bronchiectasis, though would expect productive cough and increased shortness of breath with this. Could also be other viral illness. She has no focal findings on exam to indicate bacterial illness. Her oxygen was in the lower range of normal prior to being placed on oxygen by nasal cannula. She notes she typically wears this when doing any activities. We placed her on 2 L nasal cannula and oxygen improved to 96%. We'll obtain a chest x-ray to evaluate for underlying process. We will start her on prednisone given the wheezing. She is given a prescription for doxycycline only to fill if she develops fevers or productive cough. If she fills the antibiotic she is to call our office and let us know. She is advised to wear her home O2 as directed. She is advised to check her oxygen and if it drops below 90% on oxygen she should seek medical attention. She is given return precautions.

## 2015-12-04 NOTE — Progress Notes (Signed)
Pre visit review using our clinic review tool, if applicable. No additional management support is needed unless otherwise documented below in the visit note. 

## 2015-12-04 NOTE — Patient Instructions (Signed)
Nice to meet you. You likely have an exacerbation of your bronchiectasis.  Please go get the chest x-ray to evaluate this further.  We will treat you with prednisone. I have provided an antibiotic called doxycycline to take if you develop productive cough or fever. You should only fill it if you develop these symptoms. If you fill the antibiotic you need to let us know.  If you develop chest pain, shortness of breath, fever, or productive cough, or any new or change in symptoms please seek medical attention.

## 2015-12-04 NOTE — Telephone Encounter (Signed)
Pt states that the cough is hard , no voice, feeling weak xmorning

## 2015-12-05 ENCOUNTER — Ambulatory Visit (INDEPENDENT_AMBULATORY_CARE_PROVIDER_SITE_OTHER)
Admission: RE | Admit: 2015-12-05 | Discharge: 2015-12-05 | Disposition: A | Discharge status: Home / Self Care | Payer: Medicare Other | Source: Ambulatory Visit | Attending: Family Medicine | Admitting: Family Medicine

## 2015-12-05 DIAGNOSIS — R05 Cough: Secondary | ICD-10-CM

## 2015-12-05 DIAGNOSIS — R059 Cough, unspecified: Secondary | ICD-10-CM

## 2015-12-08 ENCOUNTER — Ambulatory Visit: Payer: Medicare Other | Admitting: Internal Medicine

## 2015-12-09 NOTE — Progress Notes (Signed)
Patient failed to keep scheduled appointment and will be charged a no show fee.   

## 2015-12-09 NOTE — Assessment & Plan Note (Signed)
Patient failed to keep scheduled appointment and will be charged a no show fee.   

## 2015-12-19 ENCOUNTER — Other Ambulatory Visit: Payer: Self-pay | Admitting: Internal Medicine

## 2015-12-19 MED ORDER — OSELTAMIVIR PHOSPHATE 75 MG PO CAPS: 75.0000 mg | ORAL_CAPSULE | Freq: Every day | ORAL | Status: DC

## 2015-12-23 DIAGNOSIS — H2513 Age-related nuclear cataract, bilateral: Secondary | ICD-10-CM | POA: Diagnosis not present

## 2015-12-30 ENCOUNTER — Encounter: Payer: Self-pay | Admitting: Pulmonary Disease

## 2015-12-30 ENCOUNTER — Ambulatory Visit (INDEPENDENT_AMBULATORY_CARE_PROVIDER_SITE_OTHER): Payer: Medicare Other | Admitting: Pulmonary Disease

## 2015-12-30 VITALS — BP 112/64 | HR 71 | Ht 68.0 in | Wt 116.0 lb

## 2015-12-30 DIAGNOSIS — R5381 Other malaise: Secondary | ICD-10-CM

## 2015-12-30 DIAGNOSIS — R5383 Other fatigue: Secondary | ICD-10-CM

## 2015-12-30 DIAGNOSIS — Z9981 Dependence on supplemental oxygen: Secondary | ICD-10-CM

## 2015-12-30 DIAGNOSIS — R0902 Hypoxemia: Secondary | ICD-10-CM | POA: Diagnosis not present

## 2015-12-30 DIAGNOSIS — I4891 Unspecified atrial fibrillation: Secondary | ICD-10-CM | POA: Diagnosis not present

## 2015-12-30 DIAGNOSIS — J471 Bronchiectasis with (acute) exacerbation: Secondary | ICD-10-CM

## 2015-12-30 NOTE — Assessment & Plan Note (Signed)
Hannah Kline really has not been the same since a series of severe exacerbations about 2 years ago. Her lung function testing has been under a liter for several years now. She can then used to complain and struggle with severe dyspnea. Despite this, she does remain active with regular exercise.  Once again we spent a significant amount of time reviewing the fact that she's going to have a cough every day no matter what and she is likely going to feel short of breath no matter what because of the severity of her bronchiectasis. I do not feel that there is an additional treatment I can add that's going to make a difference based on her current symptoms level. She currently has adequate mucociliary clearance of the flutter valve and exercise, we treated her oxygenation, we however on a bronchodilator, and we do not have a chronic indolent infection is contributing to her disease.  Plan: Continue twice a day Pulmicort Brovana Continue twice a day pulmonic clearance measures Provide [place] a sample of mucus to look for evidence of AFB (she has a history of MAC the past) Stay active, continue exercising

## 2015-12-30 NOTE — Progress Notes (Signed)
Subjective:    Patient ID: Hannah Kline, female    DOB: December 25, 1931, 82 y.o.   MRN: [id number] Synopsis: [name] established care with the [facility] pulmonary office in February 2013 for bronchiectasis due to [place]. She was diagnosed at age 24 in [place]. She was treated with rifampin ethambutol and clarithromycin for 2 years. Since then she does not believe she was ever grown out MAI.  She had c.diff colitis in 2015 which was refractory to treatment.    HPI  Chief Complaint  Patient presents with  . Follow-up    pt c/o increased SOB with exertion, increased prod cough with yellow mucus.     [name] says she is not so good. Her cough is more productive which she thinks is good. She says that she is more short of breath and has been using the oxygen more even in the house more. She says that whenever she moves around she gets more short of breath.  She says that her legs feel heavy after walking across the living room. She says she is nauseated after meals a lot later in the day. Her appetite is poor. She has been sleeping more lately.  She feels that she is depressed.  She feels more depressed than typically. She is sleeping well.   She is still exercising.   Past Medical History  Diagnosis Date  . Atrial fibrillation ([place])   . History of Mycobacterium avium complex infection     lung disease  . COPD (chronic obstructive pulmonary disease) ([place])    . Bronchiectasis   . Hepatitis     h/o INH  . Bacterial infection due to Pseudomonas   . Pleurisy 12/08/11     Review of Systems  Constitutional: Negative for fever, chills, appetite change, fatigue and unexpected weight change.  HENT: Negative for congestion, ear pain and nosebleeds.   Respiratory: Negative for cough, choking and shortness of breath.   Cardiovascular: Negative for chest pain and leg swelling.       Objective:   Physical Exam Filed Vitals:   12/30/15 1514  BP: 112/64  Pulse:  71  Height: 5\' 8"  (1.727 m)  Weight: 116 lb (52.617 kg)  SpO2: 96%   2L   Gen: well appearing, no acute distress HEENT: NCAT, EOMi, OP clear,  PULM: few crackles LLL, good air movement, no wheezing CV: Irreg irreg, no mgr, no JVD AB: BS+, soft, nontender, no hsm Ext: warm, no edema, no clubbing, no cyanosis  Review of 2012 sputum microbiology: Pan sensitive pseudomonas  09/2010 Full PFT: Ratio 63%, FEV1 1.49L (67% pred)  10/2011 CT Chest: impressive upper lobe cystic bronchiectasis, scattered lower lobe tree-in-bud abnormalities and scattered nodules; One RML solid nodule measures 7.27mm  January 2013 CT chest [facility]: Stable upper lobe cystic bronchiectasis also present in the right middle lobe. Scattered tree in bud abnormalities and nodules throughout all lung fields roughly unchanged from prior. Right middle lobe nodule is no longer visible but there is an 24mm nodule in the right upper lobe.  December 2013 simple spirometry FEV1 0.8 L  10/11/2012 Sputum: PSEUDOMONAS AERUGINOSA       Antibiotic  Sensitivity  Microscan  Status    CEFEPIME  Sensitive  2  Final    CEFTAZIDIME  Sensitive  <=1  Final    CIPROFLOXACIN  Intermediate  2  Final    GENTAMICIN  Sensitive  <=1  Final    IMIPENEM  Sensitive  1  Final  LEVOFLOXACIN  Intermediate  4  Final    PIP/TAZO  Sensitive  <=4  Final    TOBRAMYCIN  Sensitive  <=1  Final    January 2014 simple spirometry >> not reproducible by ATS standards  January 2014 Sputum AFB > neg x3 11/2013 FEV1 0.93 L  2015 Sputum> Pseudomonas , Cipro/Levofloxacin Intermediate suscept; Cefepime,Ceftaz, Imi, Pip-Tazo, Gent all susceptible  September 2015 sputum culture> Klebsiella resistant to amoxicillin but susceptible to Augmentin and multiple other agents  January - February 2016 Sputum AFB negative x 3     Assessment & Plan:   Atrial fibrillation ([place]) She has paroxysmal A. fib but today she is in sinus rhythm on my exam. She only uses Pradaxa  on an as-needed basis per Dr. Donivan Scull instructions.  BRONCHIECTASIS Kelse really has not been the same since a series of severe exacerbations about 2 years ago. Her lung function testing has been under a liter for several years now. She can then used to complain and struggle with severe dyspnea. Despite this, she does remain active with regular exercise.  Once again we spent a significant amount of time reviewing the fact that she's going to have a cough every day no matter what and she is likely going to feel short of breath no matter what because of the severity of her bronchiectasis. I do not feel that there is an additional treatment I can add that's going to make a difference based on her current symptoms level. She currently has adequate mucociliary clearance of the flutter valve and exercise, we treated her oxygenation, we however on a bronchodilator, and we do not have a chronic indolent infection is contributing to her disease.  Plan: Continue twice a day Pulmicort Brovana Continue twice a day pulmonic clearance measures Provide [place] a sample of mucus to look for evidence of AFB (she has a history of MAC the past) Stay active, continue exercising  Hypoxemia requiring supplemental oxygen Continue O2 with exertion and qHS  Malaise and fatigue She has malaise and fatigue related to depression.  I offered treatment with medications vs counseling but she declined.    She may need a sleep study to evaluate her fatigue, but she sleeps well and she is not obese so I have a low index of suspicion of that disease.  > 50% of time spent face to face in a 25 minute visit  Updated Medication List Outpatient Encounter Prescriptions as of 12/30/2015  Medication Sig  . arformoterol (BROVANA) 15 MCG/2ML NEBU Take 15 mcg by nebulization 2 (two) times daily.  . budesonide (PULMICORT) 0.5 MG/2ML nebulizer solution Take 2 mLs (0.5 mg total) by nebulization 2 (two) times daily.  . clobetasol cream  (TEMOVATE) [id number] % Apply 1 application topically as needed.  . dabigatran (PRADAXA) 150 MG CAPS capsule Take 150 mg by mouth as needed.   . dorzolamide-timolol (COSOPT) 22.3-6.8 MG/ML ophthalmic solution Place 1 drop into the left eye every 12 (twelve) hours.   . flecainide (TAMBOCOR) 50 MG tablet TAKE 1 TABLET BY MOUTH TWICE A DAY  . latanoprost (XALATAN) 0.005 % ophthalmic solution Place 1 drop into the left eye At bedtime.  . levalbuterol (XOPENEX HFA) 45 MCG/ACT inhaler Inhale 2 puffs into the lungs 4 (four) times daily.  Marland Kitchen levothyroxine (SYNTHROID, LEVOTHROID) 75 MCG tablet TAKE 1 TABLET BY MOUTH DAILY.  . mometasone (NASONEX) 50 MCG/ACT nasal spray Place 2 sprays into the nose daily as needed.  . Nebulizers MISC   . ondansetron (ZOFRAN) 4  MG tablet Take 1 tablet (4 mg total) by mouth every 8 (eight) hours as needed for nausea or vomiting.  . predniSONE (DELTASONE) 20 MG tablet Take 2 tablets (40 mg total) by mouth daily with breakfast.  . Respiratory Therapy Supplies (FLUTTER) DEVI Use as directed  . [DISCONTINUED] digoxin (LANOXIN) 0.25 MG tablet Take 1 tablet (0.25 mg total) by mouth daily. As needed (Patient not taking: Reported on 12/30/2015)  . [DISCONTINUED] doxycycline (VIBRA-TABS) 100 MG tablet Take 1 tablet (100 mg total) by mouth 2 (two) times daily. (Patient not taking: Reported on 12/30/2015)  . [DISCONTINUED] oseltamivir (TAMIFLU) 75 MG capsule Take 1 capsule (75 mg total) by mouth daily. (Patient not taking: Reported on 12/30/2015)   No facility-administered encounter medications on file as of 12/30/2015.

## 2015-12-30 NOTE — Assessment & Plan Note (Signed)
She has paroxysmal A. fib but today she is in sinus rhythm on my exam. She only uses Pradaxa on an as-needed basis per Dr. Donivan Scull instructions.

## 2015-12-30 NOTE — Assessment & Plan Note (Signed)
She has malaise and fatigue related to depression.  I offered treatment with medications vs counseling but she declined.    She may need a sleep study to evaluate her fatigue, but she sleeps well and she is not obese so I have a low index of suspicion of that disease.

## 2015-12-30 NOTE — Patient Instructions (Signed)
Continue using oxygen when you exert yourself Continue using Pulmicort and Brovana twice a day Provide [place] a sample of your mucus We will see you back in 3 months or sooner if needed

## 2015-12-30 NOTE — Assessment & Plan Note (Signed)
Continue O2 with exertion and qHS

## 2016-01-01 ENCOUNTER — Telehealth: Payer: Self-pay | Admitting: *Deleted

## 2016-01-01 DIAGNOSIS — J479 Bronchiectasis, uncomplicated: Secondary | ICD-10-CM

## 2016-01-01 NOTE — Telephone Encounter (Signed)
Pt dropped off a specimen & stated that Dr. Lake Bells lets her do that. I placed an order based on previous orders same same type of specimen.

## 2016-01-09 ENCOUNTER — Encounter: Payer: Self-pay | Admitting: Internal Medicine

## 2016-01-12 ENCOUNTER — Ambulatory Visit (INDEPENDENT_AMBULATORY_CARE_PROVIDER_SITE_OTHER): Payer: Medicare Other | Admitting: Cardiovascular Disease

## 2016-01-12 ENCOUNTER — Encounter: Payer: Self-pay | Admitting: Cardiovascular Disease

## 2016-01-12 VITALS — BP 110/58 | HR 65 | Ht 68.0 in | Wt 116.0 lb

## 2016-01-12 DIAGNOSIS — J471 Bronchiectasis with (acute) exacerbation: Secondary | ICD-10-CM

## 2016-01-12 DIAGNOSIS — I4891 Unspecified atrial fibrillation: Secondary | ICD-10-CM | POA: Diagnosis not present

## 2016-01-12 MED ORDER — DIGOXIN 250 MCG PO TABS: 0.2500 mg | ORAL_TABLET | Freq: Every day | ORAL | Status: DC | PRN

## 2016-01-12 NOTE — Progress Notes (Signed)
Patient ID: Hannah Kline, female    DOB: Nov 23, 1931, 82 y.o.   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 history of C. Difficile in genera 2015 after treatment with Cipro for bronchiectasis Symptoms resolved with Flagyl 2  In follow-up today, she reports that her respiratory status has been slowly getting worse Now on oxygen when she walks, does not feel at she needs this when she is sitting, resting She has a home generator and a portable oxygen generator Denies any significant leg edema, no chest pain No recent exacerbations requiring antibiotics Situation monitored  by pulmonary, Dr. Waunita Schooner. She denies having frequent episodes of atrial fibrillation, has not had to take digoxin or extra flecainide for her symptoms Difficulty maintaining her weight, slowly losing  EKG on today's visit shows normal sinus rhythm with rate 65 bpm, no significant ST or T-wave changes, left axis deviation  Other past medical history Previous Chest x-ray done which shows stable COPD, pulmonary fibrosis, bronchiectasis.  In the past she took digoxin, extra flecainide for paroxysmal atrial fibrillation which worked well for her to restore normal sinus rhythm   In the past, she has failed sotalol. Amiodarone was discontinued given underlying lung disease. She was started on  Flecainide 50 mg twice a day with good success.  Episode of atrial fibrillation on April 27th to the 29th 2014. She came to the office, had an EKG. She took digoxin x2,  extra flecainide. This was the longest episode of atrial fibrillation she has had. She is symptomatic in atrial fibrillation. She was also given pradaxa and took his twice a day.   Other  Episodes of tachycardia in July 2013, October 2013.       Allergies  Allergen Reactions  . Isoniazid     unknown  . Moxifloxacin     Avelox  REACTION: chills, fainting    Current Outpatient Prescriptions on File Prior to Visit  Medication Sig Dispense Refill  . arformoterol (BROVANA) 15 MCG/2ML NEBU Take 15 mcg by nebulization 2 (two) times daily.    . budesonide (PULMICORT) 0.5 MG/2ML nebulizer solution Take 2 mLs (0.5 mg total) by nebulization 2 (two) times daily. 120 mL 11  . clobetasol cream (TEMOVATE) [id number] % Apply 1 application topically as needed.  2  . dabigatran (PRADAXA) 150 MG CAPS capsule Take 150 mg by mouth as needed.     . dorzolamide-timolol (COSOPT) 22.3-6.8 MG/ML ophthalmic solution Place 1 drop into the left eye every 12 (twelve) hours.     . flecainide (TAMBOCOR) 50 MG tablet TAKE 1 TABLET BY MOUTH TWICE A DAY 60 tablet 6  . latanoprost (XALATAN) 0.005 % ophthalmic solution Place 1 drop into the left eye At bedtime.    . levalbuterol (XOPENEX HFA) 45 MCG/ACT inhaler Inhale 2 puffs into the lungs 4 (four) times daily. 3 Inhaler 2  . levothyroxine (SYNTHROID, LEVOTHROID) 75 MCG tablet TAKE 1 TABLET BY MOUTH DAILY. 90 tablet 3  . mometasone (NASONEX) 50 MCG/ACT nasal spray Place 2 sprays into the nose daily as needed. 17 g 5  . Nebulizers MISC     . predniSONE (DELTASONE) 20 MG tablet Take 2 tablets (40 mg total) by mouth daily with breakfast. 10 tablet 0  . Respiratory Therapy Supplies (FLUTTER) DEVI Use as directed 1 each 0  No current facility-administered medications on file prior to visit.    Past Medical History  Diagnosis Date  . Atrial fibrillation ([place])   . History of Mycobacterium avium complex infection     lung disease  . COPD (chronic obstructive pulmonary disease) ([place])    . Bronchiectasis   . Hepatitis     h/o INH  . Bacterial infection due to Pseudomonas   . Pleurisy 12/08/11    Past Surgical History  Procedure Laterality Date  . Shoulder arthroscopy      right    Social History   reports that she has never smoked. She has never used smokeless tobacco. She reports that she drinks alcohol. She reports that she does not use illicit drugs.  Family History family history includes Angina in her mother; Diabetes in her maternal grandfather; Other in her mother.  Review of Systems  Constitutional: Negative.   Respiratory: Positive for cough and shortness of breath.   Cardiovascular: Negative.   Gastrointestinal: Negative.   Musculoskeletal: Negative.   Skin: Negative.   Neurological: Negative.   Psychiatric/Behavioral: Negative.   All other systems reviewed and are negative.    BP 110/58 mmHg  Pulse 65  Ht 5\' 8"  (1.727 m)  Wt 116 lb (52.617 kg)  BMI 17.64 kg/m2  Physical Exam  Constitutional: She is oriented to person, place, and time. She appears well-developed and well-nourished.  HENT:  Head: Normocephalic.  Nose: Nose normal.  Mouth/Throat: Oropharynx is clear and moist.  Eyes: Conjunctivae are normal. Pupils are equal, round, and reactive to light.  Neck: Normal range of motion. Neck supple. No JVD present.  Cardiovascular: Normal rate, regular rhythm, S1 normal, S2 normal, normal heart sounds and intact distal pulses.  Exam reveals no gallop and no friction rub.   No murmur heard. Pulmonary/Chest: Effort normal. No respiratory distress. She has decreased breath sounds. She has no wheezes. She has rales. She exhibits no tenderness.  Abdominal: Soft. Bowel sounds are normal. She exhibits no distension. There is no tenderness.  Musculoskeletal: Normal range of motion. She exhibits no edema or tenderness.  Lymphadenopathy:    She has no cervical adenopathy.  Neurological: She is alert and oriented to person, place, and time. Coordination normal.  Skin: Skin is warm and dry. No rash noted. No erythema.  Psychiatric: She has a normal mood and affect. Her behavior is normal. Judgment and thought content normal.    Assessment and Plan  Nursing note and  vitals reviewed.

## 2016-01-12 NOTE — Assessment & Plan Note (Signed)
No recent episodes of atrial fibrillation per the patient Maintaining normal sinus rhythm on today's visit, no changes to her medications

## 2016-01-12 NOTE — Patient Instructions (Signed)
You are doing well. No medication changes were made.  Please call us if you have new issues that need to be addressed before your next appt.  Your physician wants you to follow-up in: 6 months.  You will receive a reminder letter in the mail two months in advance. If you don't receive a letter, please call our office to schedule the follow-up appointment.   

## 2016-01-12 NOTE — Assessment & Plan Note (Signed)
Worsening shortness of breath over the past year or so, now requiring oxygen on exertion She denies any recent exacerbations requiring prednisone and antibiotics

## 2016-01-14 ENCOUNTER — Encounter: Payer: Self-pay | Admitting: Internal Medicine

## 2016-01-14 ENCOUNTER — Ambulatory Visit (INDEPENDENT_AMBULATORY_CARE_PROVIDER_SITE_OTHER): Payer: Medicare Other | Admitting: Internal Medicine

## 2016-01-14 VITALS — BP 106/62 | HR 64 | Temp 97.6°F | Ht 68.0 in | Wt 117.0 lb

## 2016-01-14 DIAGNOSIS — Z0000 Encounter for general adult medical examination without abnormal findings: Secondary | ICD-10-CM

## 2016-01-14 DIAGNOSIS — E785 Hyperlipidemia, unspecified: Secondary | ICD-10-CM

## 2016-01-14 DIAGNOSIS — Z1239 Encounter for other screening for malignant neoplasm of breast: Secondary | ICD-10-CM

## 2016-01-14 DIAGNOSIS — E441 Mild protein-calorie malnutrition: Secondary | ICD-10-CM

## 2016-01-14 DIAGNOSIS — Z7289 Other problems related to lifestyle: Secondary | ICD-10-CM | POA: Diagnosis not present

## 2016-01-14 DIAGNOSIS — M810 Age-related osteoporosis without current pathological fracture: Secondary | ICD-10-CM

## 2016-01-14 DIAGNOSIS — R5383 Other fatigue: Secondary | ICD-10-CM

## 2016-01-14 DIAGNOSIS — E559 Vitamin D deficiency, unspecified: Secondary | ICD-10-CM

## 2016-01-14 NOTE — Progress Notes (Signed)
Pre visit review using our clinic review tool, if applicable. No additional management support is needed unless otherwise documented below in the visit note. 

## 2016-01-14 NOTE — Patient Instructions (Addendum)
You can Supplement your  mid morning workout with a protein  shake between breakfast and lunch   Try Premier Protein  Shakes,  Drink one after your exercise class,    Just chill and shake  Try eating an apple before dinner to whet your appetite   For dessert:  Try the [employer] Fit greek yogurt dessert flavors  Or the [employer] Triple Zero dessert flavors and top with reddi Whip .  8 carbs,  80 calories  Talk with your dentist about getting your dental  work done so we can start Alendronate to treat your osteoporosis  I recommend getting the majority of your calcium and Vitamin D  through diet rather than supplements given the recent association of calcium supplements with increased coronary artery calcium scores.  You need 1800 mg calcium daily and a minium of 1000 IUs of D3 daily   Try the almond soy coconut, and cashew milks that most grocery stores  now carry  in the dairy  Section.  They are all cholesterol and  lactose free and have varying amounts of protein   Menopause is a normal process in which your reproductive ability comes to an end. This process happens gradually over a span of months to years, usually between the ages of 69 and 27. Menopause is complete when you have missed 12 consecutive menstrual periods. It is important to talk with your health care provider about some of the most common conditions that affect postmenopausal women, such as heart disease, cancer, and bone loss (osteoporosis). Adopting a healthy lifestyle and getting preventive care can help to promote your health and wellness. Those actions can also lower your chances of developing some of these common conditions. WHAT SHOULD I KNOW ABOUT MENOPAUSE? During menopause, you may experience a number of symptoms, such as:  Moderate-to-severe hot flashes.  Night sweats.  Decrease in sex drive.  Mood swings.  Headaches.  Tiredness.  Irritability.  Memory problems.  Insomnia. Choosing to treat or not to  treat menopausal changes is an individual decision that you make with your health care provider. WHAT SHOULD I KNOW ABOUT HORMONE REPLACEMENT THERAPY AND SUPPLEMENTS? Hormone therapy products are effective for treating symptoms that are associated with menopause, such as hot flashes and night sweats. Hormone replacement carries certain risks, especially as you become older. If you are thinking about using estrogen or estrogen with progestin treatments, discuss the benefits and risks with your health care provider. WHAT SHOULD I KNOW ABOUT HEART DISEASE AND STROKE? Heart disease, heart attack, and stroke become more likely as you age. This may be due, in part, to the hormonal changes that your body experiences during menopause. These can affect how your body processes dietary fats, triglycerides, and cholesterol. Heart attack and stroke are both medical emergencies. There are many things that you can do to help prevent heart disease and stroke:  Have your blood pressure checked at least every 1-2 years. High blood pressure causes heart disease and increases the risk of stroke.  If you are 30-39 years old, ask your health care provider if you should take aspirin to prevent a heart attack or a stroke.  Do not use any tobacco products, including cigarettes, chewing tobacco, or electronic cigarettes. If you need help quitting, ask your health care provider.  It is important to eat a healthy diet and maintain a healthy weight.  Be sure to include plenty of vegetables, fruits, low-fat dairy products, and lean protein.  Avoid eating foods  that are high in solid fats, added sugars, or salt (sodium).  Get regular exercise. This is one of the most important things that you can do for your health.  Try to exercise for at least 150 minutes each week. The type of exercise that you do should increase your heart rate and make you sweat. This is known as moderate-intensity exercise.  Try to do strengthening  exercises at least twice each week. Do these in addition to the moderate-intensity exercise.  Know your numbers.Ask your health care provider to check your cholesterol and your blood glucose. Continue to have your blood tested as directed by your health care provider. WHAT SHOULD I KNOW ABOUT CANCER SCREENING? There are several types of cancer. Take the following steps to reduce your risk and to catch any cancer development as early as possible. Breast Cancer  Practice breast self-awareness.  This means understanding how your breasts normally appear and feel.  It also means doing regular breast self-exams. Let your health care provider know about any changes, no matter how small.  If you are 50 or older, have a clinician do a breast exam (clinical breast exam or CBE) every year. Depending on your age, family history, and medical history, it may be recommended that you also have a yearly breast X-ray (mammogram).  If you have a family history of breast cancer, talk with your health care provider about genetic screening.  If you are at high risk for breast cancer, talk with your health care provider about having an MRI and a mammogram every year.  Breast cancer (BRCA) gene test is recommended for women who have family members with BRCA-related cancers. Results of the assessment will determine the need for genetic counseling and BRCA1 and for BRCA2 testing. BRCA-related cancers include these types:  Breast. This occurs in males or females.  Ovarian.  Tubal. This may also be called fallopian tube cancer.  Cancer of the abdominal or pelvic lining (peritoneal cancer).  Prostate.  Pancreatic. Cervical, Uterine, and Ovarian Cancer Your health care provider may recommend that you be screened regularly for cancer of the pelvic organs. These include your ovaries, uterus, and vagina. This screening involves a pelvic exam, which includes checking for microscopic changes to the surface of your  cervix (Pap test).  For women ages 21-65, health care providers may recommend a pelvic exam and a Pap test every three years. For women ages 17-65, they may recommend the Pap test and pelvic exam, combined with testing for human papilloma virus (HPV), every five years. Some types of HPV increase your risk of cervical cancer. Testing for HPV may also be done on women of any age who have unclear Pap test results.  Other health care providers may not recommend any screening for nonpregnant women who are considered low risk for pelvic cancer and have no symptoms. Ask your health care provider if a screening pelvic exam is right for you.  If you have had past treatment for cervical cancer or a condition that could lead to cancer, you need Pap tests and screening for cancer for at least 20 years after your treatment. If Pap tests have been discontinued for you, your risk factors (such as having a new sexual partner) need to be reassessed to determine if you should start having screenings again. Some women have medical problems that increase the chance of getting cervical cancer. In these cases, your health care provider may recommend that you have screening and Pap tests more often.  If  you have a family history of uterine cancer or ovarian cancer, talk with your health care provider about genetic screening.  If you have vaginal bleeding after reaching menopause, tell your health care provider.  There are currently no reliable tests available to screen for ovarian cancer. Lung Cancer Lung cancer screening is recommended for adults 60-31 years old who are at high risk for lung cancer because of a history of smoking. A yearly low-dose CT scan of the lungs is recommended if you:  Currently smoke.  Have a history of at least 30 pack-years of smoking and you currently smoke or have quit within the past 15 years. A pack-year is smoking an average of one pack of cigarettes per day for one year. Yearly  screening should:  Continue until it has been 15 years since you quit.  Stop if you develop a health problem that would prevent you from having lung cancer treatment. Colorectal Cancer  This type of cancer can be detected and can often be prevented.  Routine colorectal cancer screening usually begins at age 50 and continues through age 72.  If you have risk factors for colon cancer, your health care provider may recommend that you be screened at an earlier age.  If you have a family history of colorectal cancer, talk with your health care provider about genetic screening.  Your health care provider may also recommend using home test kits to check for hidden blood in your stool.  A small camera at the end of a tube can be used to examine your colon directly (sigmoidoscopy or colonoscopy). This is done to check for the earliest forms of colorectal cancer.  Direct examination of the colon should be repeated every 5-10 years until age 63. However, if early forms of precancerous polyps or small growths are found or if you have a family history or genetic risk for colorectal cancer, you may need to be screened more often. Skin Cancer  Check your skin from head to toe regularly.  Monitor any moles. Be sure to tell your health care provider:  About any new moles or changes in moles, especially if there is a change in a mole's shape or color.  If you have a mole that is larger than the size of a pencil eraser.  If any of your family members has a history of skin cancer, especially at a young age, talk with your health care provider about genetic screening.  Always use sunscreen. Apply sunscreen liberally and repeatedly throughout the day.  Whenever you are outside, protect yourself by wearing long sleeves, pants, a wide-brimmed hat, and sunglasses. WHAT SHOULD I KNOW ABOUT OSTEOPOROSIS? Osteoporosis is a condition in which bone destruction happens more quickly than new bone creation. After  menopause, you may be at an increased risk for osteoporosis. To help prevent osteoporosis or the bone fractures that can happen because of osteoporosis, the following is recommended:  If you are 7-70 years old, get at least 1,000 mg of calcium and at least 600 mg of vitamin D per day.  If you are older than age 46 but younger than age 16, get at least 1,200 mg of calcium and at least 600 mg of vitamin D per day.  If you are older than age 14, get at least 1,200 mg of calcium and at least 800 mg of vitamin D per day. Smoking and excessive alcohol intake increase the risk of osteoporosis. Eat foods that are rich in calcium and vitamin D, and do  weight-bearing exercises several times each week as directed by your health care provider. WHAT SHOULD I KNOW ABOUT HOW MENOPAUSE AFFECTS [place]? Depression may occur at any age, but it is more common as you become older. Common symptoms of depression include:  Low or sad mood.  Changes in sleep patterns.  Changes in appetite or eating patterns.  Feeling an overall lack of motivation or enjoyment of activities that you previously enjoyed.  Frequent crying spells. Talk with your health care provider if you think that you are experiencing depression. WHAT SHOULD I KNOW ABOUT IMMUNIZATIONS? It is important that you get and maintain your immunizations. These include:  Tetanus, diphtheria, and pertussis (Tdap) booster vaccine.  Influenza every year before the flu season begins.  Pneumonia vaccine.  Shingles vaccine. Your health care provider may also recommend other immunizations.   This information is not intended to replace advice given to you by your health care provider. Make sure you discuss any questions you have with your health care provider.   Document Released: 12/17/2005 Document Revised: 11/15/2014 Document Reviewed: 06/27/2014 Elsevier Interactive Patient Education [employer].

## 2016-01-14 NOTE — Progress Notes (Signed)
Patient ID: Hannah Kline, female    DOB: February 06, 1932  Age: 82 y.o. MRN: [id number] patient is here for annual Medicare wellness examination and management of other chronic and acute problems.  Patient has deferred additional screening for breast cancer due to her age and comorbidities    On portable 02 due to bronchiectasis Saw Gollan  Yesterday  For chronic atrial fib.  No recent exacerbations Losing weight   The risk factors are reflected in the social history.  The roster of all physicians providing medical care to patient - is listed in the Snapshot section of the chart.  Activities of daily living:  The patient is 100% independent in all ADLs: dressing, toileting, feeding as well as independent mobility  Home safety : The patient has smoke detectors in the home. They wear seatbelts.  There are no firearms at home. There is no violence in the home.   There is no risks for hepatitis, STDs or HIV. There is no   history of blood transfusion. They have no travel history to infectious disease endemic areas of the world.  The patient has seen their dentist in the last six month. They have seen their eye doctor in the last year. They admit to slight hearing difficulty with regard to whispered voices and some television programs.  They have deferred audiologic testing in the last year.  They do not  have excessive sun exposure. Discussed the need for sun protection: hats, long sleeves and use of sunscreen if there is significant sun exposure.   Diet: the importance of a healthy diet is discussed. They do have a healthy diet.  The benefits of regular aerobic exercise were discussed. She particpates in 2 classes for seniors 5 days per week .   Depression screen: there are no signs or vegative symptoms of depression- irritability, change in appetite, anhedonia, sadness/tearfullness.  Cognitive assessment: the patient manages all their financial and personal affairs and is actively engaged. They could  relate day,date,year and events; recalled 2/3 objects at 3 minutes; performed clock-face test normally.  The following portions of the patient's history were reviewed and updated as appropriate: allergies, current medications, past family history, past medical history,  past surgical history, past social history  and problem list.  Visual acuity was not assessed per patient preference since she has regular follow up with her ophthalmologist. Hearing and body mass index were assessed and reviewed.   During the course of the visit the patient was educated and counseled about appropriate screening and preventive services including : fall prevention , diabetes screening, nutrition counseling, colorectal cancer screening, and recommended immunizations.    CC: The primary encounter diagnosis was Other problems related to lifestyle. Diagnoses of Hyperlipidemia, Other fatigue, Vitamin D deficiency, Visit for preventive health examination, Screening for breast cancer, Protein-calorie malnutrition, mild ([place]), and Osteoporosis were also pertinent to this visit.  History Mahlaya has a past medical history of Atrial fibrillation ([place]); History of Mycobacterium avium complex infection; COPD (chronic obstructive pulmonary disease) ([place]) ( ); Bronchiectasis; Hepatitis; Bacterial infection due to Pseudomonas; and Pleurisy (12/08/11).   She has past surgical history that includes Shoulder arthroscopy.   Her family history includes Angina in her mother; Diabetes in her maternal grandfather; Other in her mother.She reports that she has never smoked. She has never used smokeless tobacco. She reports that she drinks alcohol. She reports that she does not use illicit drugs.  Outpatient Prescriptions Prior to Visit  Medication Sig Dispense Refill  . arformoterol (BROVANA) 15  MCG/2ML NEBU Take 15 mcg by nebulization 2 (two) times daily.    . budesonide (PULMICORT) 0.5 MG/2ML nebulizer solution Take 2 mLs (0.5 mg total) by  nebulization 2 (two) times daily. 120 mL 11  . clobetasol cream (TEMOVATE) [id number] % Apply 1 application topically as needed.  2  . dorzolamide-timolol (COSOPT) 22.3-6.8 MG/ML ophthalmic solution Place 1 drop into the left eye every 12 (twelve) hours.     . flecainide (TAMBOCOR) 50 MG tablet TAKE 1 TABLET BY MOUTH TWICE A DAY 60 tablet 6  . latanoprost (XALATAN) 0.005 % ophthalmic solution Place 1 drop into the left eye At bedtime.    . levalbuterol (XOPENEX HFA) 45 MCG/ACT inhaler Inhale 2 puffs into the lungs 4 (four) times daily. 3 Inhaler 2  . levothyroxine (SYNTHROID, LEVOTHROID) 75 MCG tablet TAKE 1 TABLET BY MOUTH DAILY. 90 tablet 3  . Nebulizers MISC     . Respiratory Therapy Supplies (FLUTTER) DEVI Use as directed 1 each 0  . dabigatran (PRADAXA) 150 MG CAPS capsule Take 150 mg by mouth as needed. Reported on 01/14/2016    . digoxin (LANOXIN) 0.25 MG tablet Take 1 tablet (0.25 mg total) by mouth daily as needed. (Patient not taking: Reported on 01/14/2016) 30 tablet 3  . mometasone (NASONEX) 50 MCG/ACT nasal spray Place 2 sprays into the nose daily as needed. 17 g 5  . predniSONE (DELTASONE) 20 MG tablet Take 2 tablets (40 mg total) by mouth daily with breakfast. (Patient not taking: Reported on 01/14/2016) 10 tablet 0   No facility-administered medications prior to visit.    Review of Systems  Patient denies headache, fevers, malaise, unintentional weight loss, skin rash, eye pain, sinus congestion and sinus pain, sore throat, dysphagia,  hemoptysis , cough, dyspnea, wheezing, chest pain, palpitations, orthopnea, edema, abdominal pain, nausea, melena, diarrhea, constipation, flank pain, dysuria, hematuria, urinary  Frequency, nocturia, numbness, tingling, seizures,  Focal weakness, Loss of consciousness,  Tremor, insomnia, depression, anxiety, and suicidal ideation.     Objective:  BP 106/62 mmHg  Pulse 64  Temp(Src) 97.6 F (36.4 C) (Oral)  Ht 5\' 8"  (1.727 m)  Wt 117 lb (53.071 kg)   BMI 17.79 kg/m2  SpO2 96%  Physical Exam  General appearance: alert, cooperative and appears stated age Head: Normocephalic, without obvious abnormality, atraumatic Eyes: conjunctivae/corneas clear. PERRL, EOM's intact. Fundi benign. Ears: normal TM's and external ear canals both ears Nose: Nares normal. Septum midline. Mucosa normal. No drainage or sinus tenderness. Throat: lips, mucosa, and tongue normal; teeth and gums normal Neck: no adenopathy, no carotid bruit, no JVD, supple, symmetrical, trachea midline and thyroid not enlarged, symmetric, no tenderness/mass/nodules Lungs: clear to auscultation bilaterally Breasts: normal appearance, no masses or tenderness Heart: regular rate and rhythm, S1, S2 normal, no murmur, click, rub or gallop Abdomen: soft, non-tender; bowel sounds normal; no masses,  no organomegaly Extremities: extremities normal, atraumatic, no cyanosis or edema Pulses: 2+ and symmetric Skin: Skin color, texture, turgor normal. No rashes or lesions Neurologic: Alert and oriented X 3, normal strength and tone. Normal symmetric reflexes. Normal coordination and gait.   Assessment & Plan:   Problem List Items Addressed This Visit    Screening for breast cancer    She has declined further screening       Visit for preventive health examination    Annual comprehensive preventive exam was done as well as an evaluation and management of chronic conditions .  During the course of the visit the patient was  educated and counseled about appropriate screening and preventive services including :  diabetes screening, lipid analysis with projected  10 year  risk for CAD , nutrition counseling, breast, cervical and colorectal cancer screening, and recommended immunizations.  Printed recommendations for health maintenance screenings was give      Osteoporosis    She has had no prior treatment.  Discussed therapy ,advised trial of alendronate.       Protein-calorie malnutrition,  mild ([place])     I have reviewed her diet and recommended that she increase her protein and fat intake while monitoring her carbohydrates.        Other Visit Diagnoses    Other problems related to lifestyle    -  Primary    Relevant Orders    Hepatitis C antibody    Hyperlipidemia        Relevant Orders    Lipid panel    Other fatigue        Relevant Orders    Comprehensive metabolic panel    TSH    CBC with Differential/Platelet    Vitamin D deficiency        Relevant Orders    VITAMIN D 25 Hydroxy (Vit-D Deficiency, Fractures)       I am having Ms. Penninger maintain her latanoprost, dorzolamide-timolol, dabigatran, mometasone, clobetasol cream, budesonide, Nebulizers, levalbuterol, FLUTTER, arformoterol, flecainide, levothyroxine, predniSONE, and digoxin.  No orders of the defined types were placed in this encounter.    There are no discontinued medications.  Follow-up: No Follow-up on file.   Crecencio Mc, MD

## 2016-01-15 NOTE — Assessment & Plan Note (Signed)
I have reviewed her diet and recommended that she increase her protein and fat intake while monitoring her carbohydrates.  

## 2016-01-15 NOTE — Assessment & Plan Note (Signed)
She has had no prior treatment.  Discussed therapy ,advised trial of alendronate.

## 2016-01-15 NOTE — Assessment & Plan Note (Signed)
She has declined further screening

## 2016-01-15 NOTE — Assessment & Plan Note (Signed)
Annual comprehensive preventive exam was done as well as an evaluation and management of chronic conditions .  During the course of the visit the patient was educated and counseled about appropriate screening and preventive services including :  diabetes screening, lipid analysis with projected  10 year  risk for CAD , nutrition counseling, breast, cervical and colorectal cancer screening, and recommended immunizations.  Printed recommendations for health maintenance screenings was give 

## 2016-01-19 ENCOUNTER — Telehealth: Payer: Self-pay

## 2016-01-19 DIAGNOSIS — H2513 Age-related nuclear cataract, bilateral: Secondary | ICD-10-CM | POA: Diagnosis not present

## 2016-01-19 NOTE — Telephone Encounter (Signed)
She has a pulmonologist who should be handling this since they ordered it.

## 2016-01-19 NOTE — Telephone Encounter (Signed)
AFB specimen from 2/23, Positive for AFB isolate.    Please advise. thanks

## 2016-01-20 NOTE — Telephone Encounter (Signed)
I wanted to make sure you all were given a call about this result.  Thanks, Lavella Lemons, RN

## 2016-01-21 ENCOUNTER — Encounter: Payer: Self-pay | Admitting: *Deleted

## 2016-01-21 NOTE — Telephone Encounter (Signed)
Got it.

## 2016-01-22 ENCOUNTER — Other Ambulatory Visit (INDEPENDENT_AMBULATORY_CARE_PROVIDER_SITE_OTHER): Payer: Medicare Other

## 2016-01-22 ENCOUNTER — Telehealth: Payer: Self-pay

## 2016-01-22 DIAGNOSIS — Z7289 Other problems related to lifestyle: Secondary | ICD-10-CM | POA: Diagnosis not present

## 2016-01-22 DIAGNOSIS — R5383 Other fatigue: Secondary | ICD-10-CM

## 2016-01-22 DIAGNOSIS — E559 Vitamin D deficiency, unspecified: Secondary | ICD-10-CM

## 2016-01-22 DIAGNOSIS — E785 Hyperlipidemia, unspecified: Secondary | ICD-10-CM

## 2016-01-22 LAB — LIPID PANEL
CHOLESTEROL: 177 mg/dL (ref 0–200)
HDL: 53.5 mg/dL (ref >39.00)
LDL Cholesterol: 108 mg/dL — ABNORMAL HIGH (ref 0–99)
NonHDL: 123.8
TRIGLYCERIDES: 78 mg/dL (ref 0.0–149.0)
Total CHOL/HDL Ratio: 3
VLDL: 15.6 mg/dL (ref 0.0–40.0)

## 2016-01-22 LAB — CBC WITH DIFFERENTIAL/PLATELET
BASOS ABS: 0.1 10*3/uL (ref 0.0–0.1)
BASOS PCT: 0.5 % (ref 0.0–3.0)
EOS ABS: 0.4 10*3/uL (ref 0.0–0.7)
Eosinophils Relative: 2.9 % (ref 0.0–5.0)
HCT: 39.6 % (ref 36.0–46.0)
Hemoglobin: 13.1 g/dL (ref 12.0–15.0)
Lymphocytes Relative: 11.3 % — ABNORMAL LOW (ref 12.0–46.0)
Lymphs Abs: 1.5 10*3/uL (ref 0.7–4.0)
MCHC: 33 g/dL (ref 30.0–36.0)
MCV: 94.6 fl (ref 78.0–100.0)
MONO ABS: 1 10*3/uL (ref 0.1–1.0)
Monocytes Relative: 7.5 % (ref 3.0–12.0)
NEUTROS ABS: 10.2 10*3/uL — AB (ref 1.4–7.7)
Neutrophils Relative %: 77.8 % — ABNORMAL HIGH (ref 43.0–77.0)
PLATELETS: 286 10*3/uL (ref 150.0–400.0)
RBC: 4.19 Mil/uL (ref 3.87–5.11)
RDW: 15.1 % (ref 11.5–15.5)
WBC: 13.1 10*3/uL — AB (ref 4.0–10.5)

## 2016-01-22 LAB — TSH: TSH: 3.47 u[IU]/mL (ref 0.35–4.50)

## 2016-01-22 LAB — COMPREHENSIVE METABOLIC PANEL
ALK PHOS: 75 U/L (ref 39–117)
ALT: 12 U/L (ref 0–35)
AST: 20 U/L (ref 0–37)
Albumin: 3.7 g/dL (ref 3.5–5.2)
BILIRUBIN TOTAL: 0.6 mg/dL (ref 0.2–1.2)
BUN: 11 mg/dL (ref 6–23)
CALCIUM: 8.9 mg/dL (ref 8.4–10.5)
CHLORIDE: 102 meq/L (ref 96–112)
CO2: 32 meq/L (ref 19–32)
Creatinine, Ser: 0.7 mg/dL (ref 0.40–1.20)
GFR: 85.24 mL/min (ref >60.00)
GLUCOSE: 86 mg/dL (ref 70–99)
Potassium: 4.4 mEq/L (ref 3.5–5.1)
SODIUM: 138 meq/L (ref 135–145)
Total Protein: 7.4 g/dL (ref 6.0–8.3)

## 2016-01-22 LAB — VITAMIN D 25 HYDROXY (VIT D DEFICIENCY, FRACTURES): VITD: 28.91 ng/mL — AB (ref 30.00–100.00)

## 2016-01-22 NOTE — Telephone Encounter (Signed)
Hannah Kline is faxing  STAT report. It eill be placed on your desk. Please Advise, thanks

## 2016-01-22 NOTE — Telephone Encounter (Signed)
Please fax  To Pulmonary.  I did not order it.

## 2016-01-22 NOTE — Discharge Instructions (Signed)

## 2016-01-23 LAB — HEPATITIS C ANTIBODY: HCV Ab: NEGATIVE

## 2016-01-23 NOTE — Telephone Encounter (Signed)
No , but they are getting them

## 2016-01-23 NOTE — Telephone Encounter (Signed)
Dr. Derrel Nip had the results. Can you find these please? thanks

## 2016-01-23 NOTE — Telephone Encounter (Signed)
Do you still have the results?Marland Kitchen

## 2016-01-24 ENCOUNTER — Encounter: Payer: Self-pay | Admitting: Internal Medicine

## 2016-01-26 ENCOUNTER — Ambulatory Visit
Admission: RE | Admit: 2016-01-26 | Discharge: 2016-01-26 | Disposition: A | Discharge status: Home / Self Care | Payer: Medicare Other | Source: Ambulatory Visit | Attending: Ophthalmology | Admitting: Ophthalmology

## 2016-01-26 ENCOUNTER — Ambulatory Visit: Payer: Medicare Other | Admitting: Anesthesiology

## 2016-01-26 ENCOUNTER — Encounter
Admission: RE | Disposition: A | Discharge status: Home / Self Care | Payer: Self-pay | Source: Ambulatory Visit | Attending: Ophthalmology

## 2016-01-26 DIAGNOSIS — H2512 Age-related nuclear cataract, left eye: Secondary | ICD-10-CM | POA: Insufficient documentation

## 2016-01-26 DIAGNOSIS — Z9981 Dependence on supplemental oxygen: Secondary | ICD-10-CM | POA: Insufficient documentation

## 2016-01-26 DIAGNOSIS — R0602 Shortness of breath: Secondary | ICD-10-CM | POA: Insufficient documentation

## 2016-01-26 DIAGNOSIS — H2513 Age-related nuclear cataract, bilateral: Secondary | ICD-10-CM | POA: Diagnosis not present

## 2016-01-26 DIAGNOSIS — I444 Left anterior fascicular block: Secondary | ICD-10-CM | POA: Insufficient documentation

## 2016-01-26 DIAGNOSIS — J479 Bronchiectasis, uncomplicated: Secondary | ICD-10-CM | POA: Insufficient documentation

## 2016-01-26 DIAGNOSIS — E039 Hypothyroidism, unspecified: Secondary | ICD-10-CM | POA: Diagnosis not present

## 2016-01-26 DIAGNOSIS — Z85828 Personal history of other malignant neoplasm of skin: Secondary | ICD-10-CM | POA: Diagnosis not present

## 2016-01-26 DIAGNOSIS — Z888 Allergy status to other drugs, medicaments and biological substances status: Secondary | ICD-10-CM | POA: Insufficient documentation

## 2016-01-26 DIAGNOSIS — I4891 Unspecified atrial fibrillation: Secondary | ICD-10-CM | POA: Diagnosis not present

## 2016-01-26 DIAGNOSIS — M810 Age-related osteoporosis without current pathological fracture: Secondary | ICD-10-CM | POA: Diagnosis not present

## 2016-01-26 HISTORY — DX: Reserved for inherently not codable concepts without codable children: IMO0001

## 2016-01-26 HISTORY — PX: CATARACT EXTRACTION W/PHACO: SHX586

## 2016-01-26 HISTORY — DX: Hypothyroidism, unspecified: E03.9

## 2016-01-26 HISTORY — DX: Cough: R05

## 2016-01-26 HISTORY — DX: Cough, unspecified: R05.9

## 2016-01-26 HISTORY — DX: Cardiac arrhythmia, unspecified: I49.9

## 2016-01-26 SURGERY — PHACOEMULSIFICATION, CATARACT, WITH IOL INSERTION
Anesthesia: Monitor Anesthesia Care | Site: Eye | Laterality: Left | Wound class: Clean

## 2016-01-26 MED ORDER — MIDAZOLAM HCL 2 MG/2ML IJ SOLN
INTRAMUSCULAR | Status: DC | PRN
  Administered 2016-01-26: 1.5 mg via INTRAVENOUS
  Administered 2016-01-26: 0.5 mg via INTRAVENOUS

## 2016-01-26 MED ORDER — FENTANYL CITRATE (PF) 100 MCG/2ML IJ SOLN
INTRAMUSCULAR | Status: DC | PRN
  Administered 2016-01-26: 50 ug via INTRAVENOUS

## 2016-01-26 MED ORDER — LACTATED RINGERS IV SOLN: INTRAVENOUS | Status: DC

## 2016-01-26 MED ORDER — EPINEPHRINE HCL 1 MG/ML IJ SOLN
INTRAMUSCULAR | Status: DC | PRN
  Administered 2016-01-26: 114 mL via OPHTHALMIC

## 2016-01-26 MED ORDER — CEFUROXIME OPHTHALMIC INJECTION 1 MG/0.1 ML
INJECTION | OPHTHALMIC | Status: DC | PRN
  Administered 2016-01-26: 0.1 mL via INTRACAMERAL

## 2016-01-26 MED ORDER — NA HYALUR & NA CHOND-NA HYALUR 0.4-0.35 ML IO KIT
PACK | INTRAOCULAR | Status: DC | PRN
  Administered 2016-01-26: 1 mL via INTRAOCULAR

## 2016-01-26 MED ORDER — LIDOCAINE HCL (PF) 4 % IJ SOLN
INTRAMUSCULAR | Status: DC | PRN
  Administered 2016-01-26: 4 mL via OPHTHALMIC

## 2016-01-26 MED ORDER — TETRACAINE HCL 0.5 % OP SOLN
1.0000 [drp] | OPHTHALMIC | Status: DC | PRN
  Administered 2016-01-26: 1 [drp] via OPHTHALMIC

## 2016-01-26 MED ORDER — ACETAMINOPHEN 325 MG PO TABS
325.0000 mg | ORAL_TABLET | ORAL | Status: DC | PRN
  Administered 2016-01-26: 650 mg via ORAL

## 2016-01-26 MED ORDER — ACETAMINOPHEN 160 MG/5ML PO SOLN: 325.0000 mg | ORAL | Status: DC | PRN

## 2016-01-26 MED ORDER — ACETYLCHOLINE CHLORIDE 20 MG IO SOLR
INTRAOCULAR | Status: DC | PRN
  Administered 2016-01-26: 5 mg via INTRAOCULAR

## 2016-01-26 MED ORDER — BRIMONIDINE TARTRATE 0.2 % OP SOLN
OPHTHALMIC | Status: DC | PRN
  Administered 2016-01-26: 1 [drp] via OPHTHALMIC

## 2016-01-26 MED ORDER — POVIDONE-IODINE 5 % OP SOLN
1.0000 "application " | OPHTHALMIC | Status: DC | PRN
  Administered 2016-01-26: 1 via OPHTHALMIC

## 2016-01-26 MED ORDER — ARMC OPHTHALMIC DILATING GEL
1.0000 "application " | OPHTHALMIC | Status: DC | PRN
  Administered 2016-01-26 (×2): 1 via OPHTHALMIC

## 2016-01-26 MED ORDER — TIMOLOL MALEATE 0.5 % OP SOLN
OPHTHALMIC | Status: DC | PRN
  Administered 2016-01-26: 1 [drp] via OPHTHALMIC

## 2016-01-26 SURGICAL SUPPLY — 29 items
APPLICATOR COTTON TIP 3IN (MISCELLANEOUS) ×3 IMPLANT
CANNULA ANT/CHMB 27GA (MISCELLANEOUS) ×6 IMPLANT
DISSECTOR HYDRO NUCLEUS 50X22 (MISCELLANEOUS) ×3 IMPLANT
GLOVE BIO SURGEON STRL SZ7 (GLOVE) ×3 IMPLANT
GLOVE SURG LX 6.5 MICRO (GLOVE) ×2
GLOVE SURG LX STRL 6.5 MICRO (GLOVE) ×1 IMPLANT
GOWN STRL REUS W/ TWL LRG LVL3 (GOWN DISPOSABLE) ×2 IMPLANT
GOWN STRL REUS W/TWL LRG LVL3 (GOWN DISPOSABLE) ×4
LENS IOL ACRSF MP 16.0 (Intraocular Lens) ×1 IMPLANT
LENS IOL ACRYSOF POST 16.0 (Intraocular Lens) ×3 IMPLANT
MARKER SKIN DUAL TIP RULER LAB (MISCELLANEOUS) ×3 IMPLANT
NEEDLE FILTER BLUNT 18X 1/2SAF (NEEDLE) ×4
NEEDLE FILTER BLUNT 18X1 1/2 (NEEDLE) ×2 IMPLANT
PACK CATARACT BRASINGTON (MISCELLANEOUS) ×3 IMPLANT
PACK EYE AFTER SURG (MISCELLANEOUS) ×3 IMPLANT
PACK OPTHALMIC (MISCELLANEOUS) ×3 IMPLANT
RING MALYGIN 7.0 (MISCELLANEOUS) IMPLANT
SOL BAL SALT 15ML (MISCELLANEOUS)
SOLUTION BAL SALT 15ML (MISCELLANEOUS) IMPLANT
SUT ETHILON 10-0 CS-B-6CS-B-6 (SUTURE) ×3
SUT VICRYL  9 0 (SUTURE)
SUT VICRYL 9 0 (SUTURE) IMPLANT
SUTURE EHLN 10-0 CS-B-6CS-B-6 (SUTURE) ×1 IMPLANT
SYR 3ML LL SCALE MARK (SYRINGE) ×6 IMPLANT
SYR TB 1ML LUER SLIP (SYRINGE) ×3 IMPLANT
WATER STERILE IRR 250ML POUR (IV SOLUTION) ×3 IMPLANT
WATER STERILE IRR 500ML POUR (IV SOLUTION) IMPLANT
WICK EYE OCUCEL (MISCELLANEOUS) IMPLANT
WIPE NON LINTING 3.25X3.25 (MISCELLANEOUS) ×3 IMPLANT

## 2016-01-26 NOTE — Op Note (Signed)
Date of Surgery: 01/26/2016  PREOPERATIVE DIAGNOSES: Visually significant nuclear sclerotic cataract, left eye.  POSTOPERATIVE DIAGNOSES: Same  PROCEDURES PERFORMED: Cataract extraction with intraocular lens implant, left eye.  SURGEON: Almon Hercules, M.D.  ANESTHESIA: MAC and topical  IMPLANTS: [id number] in sulcus   Implant Name Type Inv. Item Serial No. Manufacturer Lot No. LRB No. Used  LENS IOL POST 16.0 - [id number] Intraocular Lens LENS IOL POST 16.0 [id number] ALCON   Left 1    COMPLICATIONS: Posterior capsular tear.  DESCRIPTION OF PROCEDURE: Therapeutic options were discussed with the patient preoperatively, including a discussion of risks and benefits of surgery. Informed consent was obtained. An IOL-Master and immersion biometry were used to take the lens measurements, and a dilated fundus exam was performed within 6 months of the surgical date.  The patient was premedicated and brought to the operating room and placed on the operating table in the supine position. After adequate anesthesia, the patient was prepped and draped in the usual sterile ophthalmic fashion. A wire lid speculum was inserted and the microscope was positioned. A Superblade was used to create a paracentesis site at the limbus and a small amount of dilute preservative free lidocaine was instilled into the anterior chamber, followed by dispersive viscoelastic. A clear corneal incision was created temporally using a 2.4 mm keratome blade. Capsulorrhexis was then performed. In situ phacoemulsification was performed.  Cortical material was removed with the irrigation-aspiration unit; at the end of this removal a posterior capsular rent was noted. Dispersive viscoelastic was instilled to open the capsular bag and push back the anterior vitreous face. A sulcus posterior chamber intraocular lens with the specifications above was inserted and positioned after enlarging the temproal incision site. Irrigation-aspiration  was used to remove all viscoelastic. Cefuroxime 1cc was instilled into the anterior chamber as well as Miochol.  A 10-O nylon suture was placed in the temporal incision site and the corneal incision was checked and found to be water tight. The pupil was round on miosis.  The eyelid speculum was removed.  The operative eye was covered with protective goggles after instilling 1 drop of timolol and brimonidine. The patient tolerated the procedure well.

## 2016-01-26 NOTE — Transfer of Care (Signed)
Immediate Anesthesia Transfer of Care Note  Patient: Hannah Kline  Procedure(s) Performed: Procedure(s): CATARACT EXTRACTION PHACO AND INTRAOCULAR LENS PLACEMENT (IOC) (Left)  Patient Location: PACU  Anesthesia Type: MAC  Level of Consciousness: awake, alert  and patient cooperative  Airway and Oxygen Therapy: Patient Spontanous Breathing and Patient connected to supplemental oxygen  Post-op Assessment: Post-op Vital signs reviewed, Patient's Cardiovascular Status Stable, Respiratory Function Stable, Patent Airway and No signs of Nausea or vomiting  Post-op Vital Signs: Reviewed and stable  Complications: No apparent anesthesia complications

## 2016-01-26 NOTE — Anesthesia Preprocedure Evaluation (Signed)
Anesthesia Evaluation  Patient identified by MRN, date of birth, ID band Patient awake    Reviewed: Allergy & Precautions, H&P , NPO status , Patient's Chart, lab work & pertinent test results, reviewed documented beta blocker date and time   Airway Mallampati: II  TM Distance: >3 FB Neck ROM: full    Dental no notable dental hx.    Pulmonary shortness of breath and Long-Term Oxygen Therapy, COPD,  COPD inhaler,    Pulmonary exam normal breath sounds clear to auscultation       Cardiovascular Exercise Tolerance: Poor Normal cardiovascular exam+ dysrhythmias Atrial Fibrillation  Rhythm:regular Rate:Normal  Currently NSR with anterior fascicular block   Neuro/Psych negative neurological ROS  negative psych ROS   GI/Hepatic negative GI ROS, Neg liver ROS,   Endo/Other  Hypothyroidism   Renal/GU negative Renal ROS  negative genitourinary   Musculoskeletal   Abdominal   Peds  Hematology negative hematology ROS (+)   Anesthesia Other Findings   Reproductive/Obstetrics negative OB ROS                             Anesthesia Physical Anesthesia Plan  ASA: III  Anesthesia Plan: MAC   Post-op Pain Management:    Induction: Intravenous  Airway Management Planned: Nasal Cannula  Additional Equipment:   Intra-op Plan:   Post-operative Plan:   Informed Consent: I have reviewed the patients History and Physical, chart, labs and discussed the procedure including the risks, benefits and alternatives for the proposed anesthesia with the patient or authorized representative who has indicated his/her understanding and acceptance.   Dental Advisory Given  Plan Discussed with: CRNA  Anesthesia Plan Comments:         Anesthesia Quick Evaluation

## 2016-01-26 NOTE — Anesthesia Postprocedure Evaluation (Signed)
Anesthesia Post Note  Patient: Hannah Kline  Procedure(s) Performed: Procedure(s) (LRB): CATARACT EXTRACTION PHACO AND INTRAOCULAR LENS PLACEMENT (IOC) (Left)  Patient location during evaluation: PACU Anesthesia Type: MAC Level of consciousness: awake and alert Pain management: pain level controlled Vital Signs Assessment: post-procedure vital signs reviewed and stable Respiratory status: spontaneous breathing, nonlabored ventilation and respiratory function stable Cardiovascular status: stable and blood pressure returned to baseline Anesthetic complications: no    Trecia Rogers

## 2016-01-26 NOTE — H&P (Signed)
H+P reviewed and is up to date, please see paper chart.  

## 2016-01-26 NOTE — Anesthesia Procedure Notes (Signed)
Procedure Name: MAC Performed by: Earnestene Angello Pre-anesthesia Checklist: Patient identified, Emergency Drugs available, Suction available, Timeout performed and Patient being monitored Patient Re-evaluated:Patient Re-evaluated prior to inductionOxygen Delivery Method: Nasal cannula Placement Confirmation: positive ETCO2       

## 2016-01-27 ENCOUNTER — Encounter: Payer: Self-pay | Admitting: Ophthalmology

## 2016-01-28 LAB — AFB CULTURE WITH SMEAR (NOT AT ARMC)

## 2016-01-29 ENCOUNTER — Telehealth: Payer: Self-pay | Admitting: Internal Medicine

## 2016-01-29 ENCOUNTER — Ambulatory Visit: Payer: Medicare Other

## 2016-01-29 NOTE — Telephone Encounter (Signed)
I have not seen sweater

## 2016-01-29 NOTE — Telephone Encounter (Signed)
Pt called wanting to check if she left her sweater from her visit a week ago. Call pt @ [phone]. Thank you!

## 2016-01-29 NOTE — Telephone Encounter (Signed)
Quick Note:  Called and spoke with pt. Informed her of BQ's recs. I scheduled her on 02/03/16 at 4pm. Pt voiced understanding and had no further questions. Nothing further needed at this time. ______

## 2016-01-29 NOTE — Telephone Encounter (Signed)
Ok. Thank you.

## 2016-02-02 ENCOUNTER — Telehealth: Payer: Self-pay | Admitting: Internal Medicine

## 2016-02-02 ENCOUNTER — Ambulatory Visit: Payer: Self-pay | Admitting: Family Medicine

## 2016-02-02 NOTE — Telephone Encounter (Signed)
Informed provider patient taken off schedule.

## 2016-02-02 NOTE — Telephone Encounter (Signed)
Fyi, Pt called not able to make appt , due to having surgery on her left eye. Pt does not feel comfortable driving with one eye. Thank you!

## 2016-02-03 ENCOUNTER — Ambulatory Visit: Payer: Self-pay | Admitting: Pulmonary Disease

## 2016-02-03 NOTE — Progress Notes (Signed)
ERROR. Patient was not seen today.

## 2016-02-09 ENCOUNTER — Ambulatory Visit: Payer: Self-pay | Admitting: Family Medicine

## 2016-02-09 NOTE — Telephone Encounter (Signed)
Mailed unread message to patient.  

## 2016-02-11 ENCOUNTER — Ambulatory Visit (INDEPENDENT_AMBULATORY_CARE_PROVIDER_SITE_OTHER): Payer: Medicare Other | Admitting: Family Medicine

## 2016-02-11 ENCOUNTER — Encounter: Payer: Self-pay | Admitting: Family Medicine

## 2016-02-11 VITALS — BP 120/62 | HR 65 | Temp 97.5°F | Ht 68.0 in | Wt 115.0 lb

## 2016-02-11 DIAGNOSIS — H6123 Impacted cerumen, bilateral: Secondary | ICD-10-CM | POA: Diagnosis not present

## 2016-02-11 NOTE — Progress Notes (Signed)
   Subjective:  Patient ID: Hannah Kline, female    DOB: July 07, 1932  Age: 82 y.o. MRN: [id number]  CC: Ear wax build up  HPI:  82 year old female presents with the above complaint.  Patient states that she was recently seen by her physician and was told that she had wax buildup in both ears. She states that since that time she's been using some eardrops regularly to help with this. No reports of hearing loss. No other complaints at this time. She would like her ears checked and noted to be irrigated if needed.  Social Hx   Social History   Social History  . Marital Status: Married    Spouse Name: N/A  . Number of Children: 5  . Years of Education: N/A   Occupational History  . 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 Smoking status: Never Smoker   . Smokeless tobacco: Never Used  . Alcohol Use: 4.2 - 4.8 oz/week    0-1 Standard drinks or equivalent, 7 Glasses of wine per week  . Drug Use: No  . Sexual Activity: Not Asked   Other Topics Concern  . None   Social History Narrative   Review of Systems  Constitutional: Negative.   HENT: Negative for hearing loss.    Objective:  BP 120/62 mmHg  Pulse 65  Temp(Src) 97.5 F (36.4 C) (Oral)  Ht 5\' 8"  (1.727 m)  Wt 115 lb (52.164 kg)  BMI 17.49 kg/m2  SpO2 94%  BP/Weight 02/11/2016 [id number]  Systolic BP 123456 [id number]  Diastolic BP 62 62 58  Wt. (Lbs) 115 117 116  BMI 17.49 17.79 17.64   Physical Exam  Constitutional: She is oriented to person, place, and time. She appears well-developed. No distress.  HENT:  Normal TM's bilaterally. No cerumen noted.  Pulmonary/Chest: Effort normal.  Neurological: She is alert and oriented to person, place, and time.  Psychiatric: She has a normal mood and affect.  Vitals reviewed.  Lab Results  Component Value Date   WBC 13.1* 01/22/2016   HGB 13.1 01/22/2016   HCT 39.6 01/22/2016   PLT 286.0 01/22/2016   GLUCOSE 86 01/22/2016   CHOL 177 01/22/2016   TRIG 78.0 01/22/2016   HDL 53.50 01/22/2016   LDLCALC 108* 01/22/2016   ALT 12 01/22/2016   AST 20 01/22/2016   NA 138 01/22/2016   K 4.4 01/22/2016   CL 102 01/22/2016   CREATININE 0.70 01/22/2016   BUN 11 01/22/2016   CO2 32 01/22/2016   TSH 3.47 01/22/2016   INR 1.0 11/21/2011   HGBA1C 5.8* 11/22/2014    Assessment & Plan:   Problem List Items Addressed This Visit    Excessive cerumen in both ear canals - Primary    Ears clear today. Advised continued use of OTC drops as needed.         Follow-up: PRN  [place]

## 2016-02-11 NOTE — Assessment & Plan Note (Signed)
Ears clear today. Advised continued use of OTC drops as needed.

## 2016-02-11 NOTE — Progress Notes (Signed)
Pre visit review using our clinic review tool, if applicable. No additional management support is needed unless otherwise documented below in the visit note. 

## 2016-02-27 ENCOUNTER — Other Ambulatory Visit: Payer: Self-pay

## 2016-02-27 MED ORDER — LEVOTHYROXINE SODIUM 75 MCG PO TABS: 75.0000 ug | ORAL_TABLET | Freq: Every day | ORAL | Status: DC

## 2016-03-02 DIAGNOSIS — H401492 Capsular glaucoma with pseudoexfoliation of lens, unspecified eye, moderate stage: Secondary | ICD-10-CM | POA: Diagnosis not present

## 2016-03-03 DIAGNOSIS — H401123 Primary open-angle glaucoma, left eye, severe stage: Secondary | ICD-10-CM | POA: Diagnosis not present

## 2016-03-09 ENCOUNTER — Telehealth: Payer: Self-pay | Admitting: Pulmonary Disease

## 2016-03-09 MED ORDER — ARFORMOTEROL TARTRATE 15 MCG/2ML IN NEBU: 15.0000 ug | INHALATION_SOLUTION | Freq: Two times a day (BID) | RESPIRATORY_TRACT | Status: DC

## 2016-03-09 MED ORDER — BUDESONIDE 0.5 MG/2ML IN SUSP: 0.5000 mg | Freq: Two times a day (BID) | RESPIRATORY_TRACT | Status: DC

## 2016-03-09 NOTE — Telephone Encounter (Signed)
Spoke with pt, requesting below stated [facility]ills [facility].  This has been sent.  Nothing further needed.

## 2016-04-01 ENCOUNTER — Encounter: Payer: Self-pay | Admitting: Pulmonary Disease

## 2016-04-01 ENCOUNTER — Ambulatory Visit: Payer: Self-pay | Admitting: Pulmonary Disease

## 2016-04-01 ENCOUNTER — Ambulatory Visit (INDEPENDENT_AMBULATORY_CARE_PROVIDER_SITE_OTHER): Payer: Medicare Other | Admitting: Pulmonary Disease

## 2016-04-01 VITALS — BP 124/62 | HR 68 | Ht 68.0 in | Wt 117.0 lb

## 2016-04-01 DIAGNOSIS — J471 Bronchiectasis with (acute) exacerbation: Secondary | ICD-10-CM

## 2016-04-01 DIAGNOSIS — R0902 Hypoxemia: Secondary | ICD-10-CM

## 2016-04-01 DIAGNOSIS — Z9981 Dependence on supplemental oxygen: Secondary | ICD-10-CM

## 2016-04-01 NOTE — Progress Notes (Signed)
Subjective:    Patient ID: Falena Winterfeld, female    DOB: Apr 06, 1932, 82 y.o.   MRN: [id number] Synopsis: [name] established care with the [facility] pulmonary office in February 2013 for bronchiectasis due to [place]. She was diagnosed at age 32 in [place]. She was treated with rifampin ethambutol and clarithromycin for 2 years. Since then she does not believe she was ever grown out MAI.  She had c.diff colitis in 2015 which was refractory to treatment.    HPI  Chief Complaint  Patient presents with  . Follow-up    Pt c/o increased sob with any exertion, prod cough with yellow mucus, unsteady gait, weakness.    Molina has been struggling with her eyes recently.  She had a cataract surgery in her left eye and since then she continues to have persistent dilation of her left eye.  She has been seen by a couple of different opthalmologists who have been treating it with drops and she has been referred to [facility] for a third opinion on management of this.  She still can't see very well out of the eye.    She continues to struggle with dyspnea and fatigue.  She feels like she is doing worse and she is very out of breath.  Minimal exertion around the house and is using her oxygen more.  Her weight is stable, no fevers or chills.  She is eating.  She continues to produce mucus, but she is not producing more than before.  She is weak.  She denies fever, chills.  She continues to use her oxygen.   She has a therapy vest at home but she doesn't use it.    Past Medical History  Diagnosis Date  . Atrial fibrillation ([place])   . History of Mycobacterium avium complex infection     lung disease  . COPD (chronic obstructive pulmonary disease) ([place])    . Bronchiectasis   . Bacterial infection due to Pseudomonas   . Pleurisy 12/08/11  . Hepatitis     h/o INH(isoniazide)  . Cough     from bronchiectasis neb txs  . Shortness of breath dyspnea   . Hypothyroidism   . Dysrhythmia      A Fib     Review of Systems  Constitutional: Negative for fever, chills, appetite change, fatigue and unexpected weight change.  HENT: Negative for congestion, ear pain and nosebleeds.   Respiratory: Negative for cough, choking and shortness of breath.   Cardiovascular: Negative for chest pain and leg swelling.       Objective:   Physical Exam Filed Vitals:   04/01/16 1343  BP: 124/62  Pulse: 68  Height: 5\' 8"  (1.727 m)  Weight: 117 lb (53.071 kg)  SpO2: 95%   2L   Gen: well appearing, no acute distress HEENT: NCAT, EOMi, OP clear,  PULM: few crackles LLL, good air movement, no wheezing CV: Irreg irreg, no mgr, no JVD AB: BS+, soft, nontender, no hsm Ext: warm, no edema, no clubbing, no cyanosis  Review of 2012 sputum microbiology: Pan sensitive pseudomonas  09/2010 Full PFT: Ratio 63%, FEV1 1.49L (67% pred)  10/2011 CT Chest: impressive upper lobe cystic bronchiectasis, scattered lower lobe tree-in-bud abnormalities and scattered nodules; One RML solid nodule measures 7.45mm  January 2013 CT chest [facility]: Stable upper lobe cystic bronchiectasis also present in the right middle lobe. Scattered tree in bud abnormalities and nodules throughout all lung fields roughly unchanged from prior. Right middle lobe nodule  is no longer visible but there is an 60mm nodule in the right upper lobe.  December 2013 simple spirometry FEV1 0.8 L  10/11/2012 Sputum: PSEUDOMONAS AERUGINOSA       Antibiotic  Sensitivity  Microscan  Status    CEFEPIME  Sensitive  2  Final    CEFTAZIDIME  Sensitive  <=1  Final    CIPROFLOXACIN  Intermediate  2  Final    GENTAMICIN  Sensitive  <=1  Final    IMIPENEM  Sensitive  1  Final    LEVOFLOXACIN  Intermediate  4  Final    PIP/TAZO  Sensitive  <=4  Final    TOBRAMYCIN  Sensitive  <=1  Final    January 2014 simple spirometry >> not reproducible by ATS standards  January 2014 Sputum AFB > neg x3 11/2013 FEV1 0.93 L  2015 Sputum> Pseudomonas ,  Cipro/Levofloxacin Intermediate suscept; Cefepime,Ceftaz, Imi, Pip-Tazo, Gent all susceptible  September 2015 sputum culture> Klebsiella resistant to amoxicillin but susceptible to Augmentin and multiple other agents  January - February 2016 Sputum AFB negative x 3     Assessment & Plan:   Hypoxemia requiring supplemental oxygen Continue using oxygen as prescribed at 2 L with exertion and daily at bedtime.  BRONCHIECTASIS Unfortunately Alanie had a sputum culture positive for mycobacterial disease earlier this year. This creates quite a dilemma because she's been experiencing increasing shortness of breath and declining lung function for years with multiple negative sputum samples. Right now, she does not have evidence of systemic infection as she's not losing weight and she is not febrile. However, considering the severity of her shortness of breath and progressive symptoms it would not be unreasonable to treat her for mycobacterial disease but her case is quite complicated as she has a history of C. difficile and a reaction to isoniazid.  She's been experiencing increasing symptoms recently but she's not been participating in mucociliary clearance.  Plan: Use therapy vest twice a day for the next 6-8 weeks Sample sputum again, send for AFB culture, if positive will consider treatment on next visit If no improvement with therapy vest then consider short-term burst of antibiotics (Augmentin) for a bronchiectasis flare up Continue Brovana and Pulmicort Continue to exercise regularly Follow-up 7-8 weeks  Greater than 50% of this visit was spent face-to-face, 27 minute visit  > 50% of time spent face to face in a 25 minute visit  Updated Medication List Outpatient Encounter Prescriptions as of 04/01/2016  Medication Sig  . arformoterol (BROVANA) 15 MCG/2ML NEBU Take 2 mLs (15 mcg total) by nebulization 2 (two) times daily.  . brimonidine (ALPHAGAN) 0.2 % ophthalmic solution 2 (two) times  daily.  . budesonide (PULMICORT) 0.5 MG/2ML nebulizer solution Take 2 mLs (0.5 mg total) by nebulization 2 (two) times daily.  . clobetasol cream (TEMOVATE) AB-123456789 % Apply 1 application topically as needed.  . dabigatran (PRADAXA) 150 MG CAPS capsule Take 150 mg by mouth as needed. Reported on 01/26/2016  . digoxin (LANOXIN) 0.25 MG tablet Take 1 tablet (0.25 mg total) by mouth daily as needed.  . dorzolamide-timolol (COSOPT) 22.3-6.8 MG/ML ophthalmic solution Place 1 drop into the left eye every 12 (twelve) hours.   . flecainide (TAMBOCOR) 50 MG tablet TAKE 1 TABLET BY MOUTH TWICE A DAY  . latanoprost (XALATAN) 0.005 % ophthalmic solution Place 1 drop into the left eye At bedtime.  Marland Kitchen levothyroxine (SYNTHROID, LEVOTHROID) 75 MCG tablet Take 1 tablet (75 mcg total) by mouth daily.  Marland Kitchen  mometasone (NASONEX) 50 MCG/ACT nasal spray Place 2 sprays into the nose daily as needed.  . OXYGEN Inhale 2 L into the lungs. [place] - when exercising, walking around, and at night  . Respiratory Therapy Supplies (FLUTTER) DEVI Use as directed  . [DISCONTINUED] levalbuterol (XOPENEX HFA) 45 MCG/ACT inhaler Inhale 2 puffs into the lungs 4 (four) times daily. (Patient not taking: Reported on 04/01/2016)   No facility-administered encounter medications on file as of 04/01/2016.

## 2016-04-01 NOTE — Assessment & Plan Note (Signed)
Unfortunately Hannah Kline had a sputum culture positive for mycobacterial disease earlier this year. This creates quite a dilemma because she's been experiencing increasing shortness of breath and declining lung function for years with multiple negative sputum samples. Right now, she does not have evidence of systemic infection as she's not losing weight and she is not febrile. However, considering the severity of her shortness of breath and progressive symptoms it would not be unreasonable to treat her for mycobacterial disease but her case is quite complicated as she has a history of C. difficile and a reaction to isoniazid.  She's been experiencing increasing symptoms recently but she's not been participating in mucociliary clearance.  Plan: Use therapy vest twice a day for the next 6-8 weeks Sample sputum again, send for AFB culture, if positive will consider treatment on next visit If no improvement with therapy vest then consider short-term burst of antibiotics (Augmentin) for a bronchiectasis flare up Continue Brovana and Pulmicort Continue to exercise regularly Follow-up 7-8 weeks  Greater than 50% of this visit was spent face-to-face, 27 minute visit

## 2016-04-01 NOTE — Patient Instructions (Signed)
Turn in a sample mucus as soon as possible We will call you and we see the results of your mucus culture Use your therapy vest twice a day for the next 4-6 weeks Continue taking her medicines as you're doing Continue using oxygen as you're doing I will see you back in 7-8 weeks or sooner if needed

## 2016-04-01 NOTE — Assessment & Plan Note (Signed)
Continue using oxygen as prescribed at 2 L with exertion and daily at bedtime.

## 2016-04-06 ENCOUNTER — Other Ambulatory Visit: Payer: Self-pay | Admitting: Pulmonary Disease

## 2016-04-06 ENCOUNTER — Other Ambulatory Visit: Payer: Self-pay | Admitting: *Deleted

## 2016-04-06 DIAGNOSIS — D485 Neoplasm of uncertain behavior of skin: Secondary | ICD-10-CM | POA: Diagnosis not present

## 2016-04-06 DIAGNOSIS — L570 Actinic keratosis: Secondary | ICD-10-CM | POA: Diagnosis not present

## 2016-04-06 DIAGNOSIS — D229 Melanocytic nevi, unspecified: Secondary | ICD-10-CM | POA: Diagnosis not present

## 2016-04-06 DIAGNOSIS — J471 Bronchiectasis with (acute) exacerbation: Secondary | ICD-10-CM | POA: Diagnosis not present

## 2016-04-06 DIAGNOSIS — L723 Sebaceous cyst: Secondary | ICD-10-CM | POA: Diagnosis not present

## 2016-04-06 DIAGNOSIS — L853 Xerosis cutis: Secondary | ICD-10-CM | POA: Diagnosis not present

## 2016-04-06 DIAGNOSIS — Z85828 Personal history of other malignant neoplasm of skin: Secondary | ICD-10-CM | POA: Diagnosis not present

## 2016-04-06 DIAGNOSIS — Z85820 Personal history of malignant melanoma of skin: Secondary | ICD-10-CM | POA: Diagnosis not present

## 2016-04-06 DIAGNOSIS — L821 Other seborrheic keratosis: Secondary | ICD-10-CM | POA: Diagnosis not present

## 2016-04-06 DIAGNOSIS — Z1283 Encounter for screening for malignant neoplasm of skin: Secondary | ICD-10-CM | POA: Diagnosis not present

## 2016-04-06 DIAGNOSIS — L820 Inflamed seborrheic keratosis: Secondary | ICD-10-CM | POA: Diagnosis not present

## 2016-04-06 DIAGNOSIS — L578 Other skin changes due to chronic exposure to nonionizing radiation: Secondary | ICD-10-CM | POA: Diagnosis not present

## 2016-04-06 DIAGNOSIS — D1800 Hemangioma unspecified site: Secondary | ICD-10-CM | POA: Diagnosis not present

## 2016-04-08 DIAGNOSIS — Z961 Presence of intraocular lens: Secondary | ICD-10-CM | POA: Diagnosis not present

## 2016-04-08 DIAGNOSIS — H2511 Age-related nuclear cataract, right eye: Secondary | ICD-10-CM | POA: Diagnosis not present

## 2016-04-08 DIAGNOSIS — H401423 Capsular glaucoma with pseudoexfoliation of lens, left eye, severe stage: Secondary | ICD-10-CM | POA: Diagnosis not present

## 2016-04-09 LAB — RESPIRATORY CULTURE OR RESPIRATORY AND SPUTUM CULTURE

## 2016-04-23 ENCOUNTER — Other Ambulatory Visit: Payer: Self-pay | Admitting: Cardiovascular Disease

## 2016-04-26 DIAGNOSIS — H59032 Cystoid macular edema following cataract surgery, left eye: Secondary | ICD-10-CM | POA: Diagnosis not present

## 2016-04-26 DIAGNOSIS — H401423 Capsular glaucoma with pseudoexfoliation of lens, left eye, severe stage: Secondary | ICD-10-CM | POA: Diagnosis not present

## 2016-04-26 DIAGNOSIS — H47323 Drusen of optic disc, bilateral: Secondary | ICD-10-CM | POA: Diagnosis not present

## 2016-04-26 DIAGNOSIS — H538 Other visual disturbances: Secondary | ICD-10-CM | POA: Diagnosis not present

## 2016-04-26 DIAGNOSIS — H35362 Drusen (degenerative) of macula, left eye: Secondary | ICD-10-CM | POA: Diagnosis not present

## 2016-04-26 DIAGNOSIS — H35372 Puckering of macula, left eye: Secondary | ICD-10-CM | POA: Diagnosis not present

## 2016-04-30 ENCOUNTER — Telehealth: Payer: Self-pay | Admitting: Pulmonary Disease

## 2016-04-30 NOTE — Telephone Encounter (Signed)
Patient called and stated that she wanted BQ to know exactly what makes her say she is feeling worse than before. She states that she had to increase her oxygen setting from 2 to 3. She is still coughing up sputum. She has been doing BQ's recommendations of using the vest and Brovana but there is not change with her cough. She also says that she has no appetite and she has lost weight in the last month. She is nauseous after eating and has leg weakness (she has to sit to shower) and fatigue. She can be reached at [phone]

## 2016-04-30 NOTE — Telephone Encounter (Signed)
Patient called and stated that she wanted BQ to know exactly what makes her say she is feeling worse than before. She states that she had to increase her oxygen setting from 2 to 3. She is still coughing up sputum. She has been doing BQ's recommendations of using the vest and Brovana but there is not change with her cough. She also says that she has no appetite and she has lost weight in the last month. She is nauseous after eating and has leg weakness (she has to sit to shower) and fatigue. She can be reached at [phone]-[phone] ---------------------------   Patient states that when she saw Dr. Lake Bells last [place], she told him that she was "feeling worse", she said that she just wanted him to know "WHY" she is feeling worse.  She said that she does not need anything at this time, she just wanted him to know.  Advised patient that Dr. Lake Bells is out of the office next week.  Advised her that we have plenty of providers here that can help her if she needs [place].  She said that if she has any trouble she will call us back.  FYI to Dr. Lake Bells

## 2016-05-04 DIAGNOSIS — Z961 Presence of intraocular lens: Secondary | ICD-10-CM | POA: Diagnosis not present

## 2016-05-04 DIAGNOSIS — H401423 Capsular glaucoma with pseudoexfoliation of lens, left eye, severe stage: Secondary | ICD-10-CM | POA: Diagnosis not present

## 2016-05-05 DIAGNOSIS — H401423 Capsular glaucoma with pseudoexfoliation of lens, left eye, severe stage: Secondary | ICD-10-CM | POA: Diagnosis not present

## 2016-05-05 DIAGNOSIS — H401123 Primary open-angle glaucoma, left eye, severe stage: Secondary | ICD-10-CM | POA: Diagnosis not present

## 2016-05-05 DIAGNOSIS — I499 Cardiac arrhythmia, unspecified: Secondary | ICD-10-CM | POA: Diagnosis not present

## 2016-05-05 DIAGNOSIS — J449 Chronic obstructive pulmonary disease, unspecified: Secondary | ICD-10-CM | POA: Diagnosis not present

## 2016-05-05 DIAGNOSIS — Z79899 Other long term (current) drug therapy: Secondary | ICD-10-CM | POA: Diagnosis not present

## 2016-05-10 NOTE — Telephone Encounter (Signed)
A, Can you see if she is feeling any better?  If not then see if she can get in to see [place] please. Thanks B

## 2016-05-16 DIAGNOSIS — H538 Other visual disturbances: Secondary | ICD-10-CM | POA: Diagnosis not present

## 2016-05-16 DIAGNOSIS — H5712 Ocular pain, left eye: Secondary | ICD-10-CM | POA: Diagnosis not present

## 2016-05-16 DIAGNOSIS — H409 Unspecified glaucoma: Secondary | ICD-10-CM | POA: Diagnosis not present

## 2016-05-25 LAB — AFB CULTURE WITH SMEAR (NOT AT ARMC): SOURCE: 0

## 2016-05-25 LAB — FUNGUS CULTURE W SMEAR

## 2016-05-27 ENCOUNTER — Ambulatory Visit (INDEPENDENT_AMBULATORY_CARE_PROVIDER_SITE_OTHER): Payer: Medicare Other | Admitting: Pulmonary Disease

## 2016-05-27 ENCOUNTER — Encounter: Payer: Self-pay | Admitting: Pulmonary Disease

## 2016-05-27 VITALS — BP 118/62 | HR 84 | Ht 68.0 in | Wt 113.8 lb

## 2016-05-27 DIAGNOSIS — Z9981 Dependence on supplemental oxygen: Principal | ICD-10-CM

## 2016-05-27 DIAGNOSIS — J471 Bronchiectasis with (acute) exacerbation: Secondary | ICD-10-CM | POA: Diagnosis not present

## 2016-05-27 DIAGNOSIS — R531 Weakness: Secondary | ICD-10-CM

## 2016-05-27 DIAGNOSIS — R0902 Hypoxemia: Secondary | ICD-10-CM

## 2016-05-27 NOTE — Assessment & Plan Note (Signed)
She's been feeling generally weak and unsteady since her eye surgery. She feels that she is at risk for a fall.  On exam, there is no focal neurologic symptoms.  Plan: Physical therapy referral

## 2016-05-27 NOTE — Assessment & Plan Note (Signed)
She actually seems to be a bit better since the last visit. I was pleased to see that the repeat sputum culture was negative for AFB organisms.  She's been struggling with ability to do mucociliary clearance measures because of her recent multiple eye surgeries. However, she's been compliant with her bronchodilator therapy and seems to be doing well with that.  Plan: Continue Pulmicort and Brovana I would like for her to start exercising again, but before that she needs to see physical therapy, see below Follow-up 3 months

## 2016-05-27 NOTE — Patient Instructions (Signed)
Keep using the Brovana and Pulmicort as you're doing After you see physical therapy start exercising again Use her oxygen as you're doing We will see you back in 3 months or sooner if needed

## 2016-05-27 NOTE — Progress Notes (Signed)
Subjective:    Patient ID: Hannah Kline, female    DOB: February 28, 1932, 82 y.o.   MRN: [id number] Synopsis: [name] established care with the [facility] pulmonary office in February 2013 for bronchiectasis due to [place]. She was diagnosed at age 84 in [place]. She was treated with rifampin ethambutol and clarithromycin for 2 years. Since then she does not believe she was ever grown out MAI.  She had c.diff colitis in 2015 which was refractory to treatment.    HPI  Chief Complaint  Patient presents with  . Follow-up    Noticed some body weakness,coughing, noticed some light yellow mcus, pt. is requesting not to be on cipro   Coraima used the vest for the first week after I saw her but she had to stop because she needed urgent eye surgery for problems with her trabeculae.  She has not been exercising due to this.  She has been using her brovana regularly and pulmicort.    Specifically she feels that her equilibrilium is off and she feels dizzy, so she hasn't been exercising as much.    She is coughing up light colored mucus.  Sometimes it is white.    Her weight has been stable, her husband says she is eating better.    She continues to use her oxygen regularly with exertion.  Past Medical History  Diagnosis Date  . Atrial fibrillation ([place])   . History of Mycobacterium avium complex infection     lung disease  . COPD (chronic obstructive pulmonary disease) ([place])    . Bronchiectasis   . Bacterial infection due to Pseudomonas   . Pleurisy 12/08/11  . Hepatitis     h/o INH(isoniazide)  . Cough     from bronchiectasis neb txs  . Shortness of breath dyspnea   . Hypothyroidism   . Dysrhythmia     A Fib     Review of Systems  Constitutional: Negative for fever, chills, appetite change, fatigue and unexpected weight change.  HENT: Negative for congestion, ear pain and nosebleeds.   Respiratory: Negative for cough, choking and shortness of breath.    Cardiovascular: Negative for chest pain and leg swelling.       Objective:   Physical Exam Filed Vitals:   05/27/16 1510  BP: 118/62  Pulse: 84  Height: 5\' 8"  (1.727 m)  Weight: 113 lb 12.8 oz (51.619 kg)  SpO2: 93%   2L   Gen: well appearing, no acute distress HEENT: NCAT, EOMi, OP clear,  PULM: few crackles LLL, good air movement, no wheezing CV: Irreg irreg, no mgr, no JVD AB: BS+, soft, nontender, no hsm Ext: warm, no edema, no clubbing, no cyanosis  Review of 2012 sputum microbiology: Pan sensitive pseudomonas  09/2010 Full PFT: Ratio 63%, FEV1 1.49L (67% pred)  10/2011 CT Chest: impressive upper lobe cystic bronchiectasis, scattered lower lobe tree-in-bud abnormalities and scattered nodules; One RML solid nodule measures 7.17mm  January 2013 CT chest [facility]: Stable upper lobe cystic bronchiectasis also present in the right middle lobe. Scattered tree in bud abnormalities and nodules throughout all lung fields roughly unchanged from prior. Right middle lobe nodule is no longer visible but there is an 35mm nodule in the right upper lobe.  December 2013 simple spirometry FEV1 0.8 L  10/11/2012 Sputum: PSEUDOMONAS AERUGINOSA       Antibiotic  Sensitivity  Microscan  Status    CEFEPIME  Sensitive  2  Final    CEFTAZIDIME  Sensitive  <=  1  Final    CIPROFLOXACIN  Intermediate  2  Final    GENTAMICIN  Sensitive  <=1  Final    IMIPENEM  Sensitive  1  Final    LEVOFLOXACIN  Intermediate  4  Final    PIP/TAZO  Sensitive  <=4  Final    TOBRAMYCIN  Sensitive  <=1  Final    January 2014 simple spirometry >> not reproducible by ATS standards  January 2014 Sputum AFB > neg x3 11/2013 FEV1 0.93 L  2015 Sputum> Pseudomonas , Cipro/Levofloxacin Intermediate suscept; Cefepime,Ceftaz, Imi, Pip-Tazo, Gent all susceptible  September 2015 sputum culture> Klebsiella resistant to amoxicillin but susceptible to Augmentin and multiple other agents  January - February 2016 Sputum AFB  negative x 3  12/2015 AFB sputum > MAI 03/2016 AFB sputum > negative     Assessment & Plan:   Hypoxemia requiring supplemental oxygen Continue 2 L of oxygen with exertion We will check  oximetry ambulatory today to make sure that that is enough   BRONCHIECTASIS She actually seems to be a bit better since the last visit. I was pleased to see that the repeat sputum culture was negative for AFB organisms.  She's been struggling with ability to do mucociliary clearance measures because of her recent multiple eye surgeries. However, she's been compliant with her bronchodilator therapy and seems to be doing well with that.  Plan: Continue Pulmicort and Brovana I would like for her to start exercising again, but before that she needs to see physical therapy, see below Follow-up 3 months  Weakness generalized She's been feeling generally weak and unsteady since her eye surgery. She feels that she is at risk for a fall.  On exam, there is no focal neurologic symptoms.  Plan: Physical therapy referral    Updated Medication List Outpatient Encounter Prescriptions as of 05/27/2016  Medication Sig  . arformoterol (BROVANA) 15 MCG/2ML NEBU Take 2 mLs (15 mcg total) by nebulization 2 (two) times daily.  . budesonide (PULMICORT) 0.5 MG/2ML nebulizer solution Take 2 mLs (0.5 mg total) by nebulization 2 (two) times daily.  . digoxin (LANOXIN) 0.25 MG tablet Take 1 tablet (0.25 mg total) by mouth daily as needed.  . flecainide (TAMBOCOR) 50 MG tablet TAKE 1 TABLET BY MOUTH TWICE A DAY  . levothyroxine (SYNTHROID, LEVOTHROID) 75 MCG tablet Take 1 tablet (75 mcg total) by mouth daily.  . OXYGEN Inhale 2 L into the lungs. [place] - when exercising, walking around, and at night  . Respiratory Therapy Supplies (FLUTTER) DEVI Use as directed  . dabigatran (PRADAXA) 150 MG CAPS capsule Take 150 mg by mouth as needed. Reported on 05/27/2016  . mometasone (NASONEX) 50 MCG/ACT nasal spray Place 2 sprays into  the nose daily as needed.  . [DISCONTINUED] brimonidine (ALPHAGAN) 0.2 % ophthalmic solution 2 (two) times daily.  . [DISCONTINUED] clobetasol cream (TEMOVATE) AB-123456789 % Apply 1 application topically as needed.  . [DISCONTINUED] dorzolamide-timolol (COSOPT) 22.3-6.8 MG/ML ophthalmic solution Place 1 drop into the left eye every 12 (twelve) hours.   . [DISCONTINUED] latanoprost (XALATAN) 0.005 % ophthalmic solution Place 1 drop into the left eye At bedtime.   No facility-administered encounter medications on file as of 05/27/2016.

## 2016-05-27 NOTE — Assessment & Plan Note (Signed)
Continue 2 L of oxygen with exertion We will check  oximetry ambulatory today to make sure that that is enough

## 2016-06-03 DIAGNOSIS — M6281 Muscle weakness (generalized): Secondary | ICD-10-CM | POA: Diagnosis not present

## 2016-06-03 DIAGNOSIS — R262 Difficulty in walking, not elsewhere classified: Secondary | ICD-10-CM | POA: Diagnosis not present

## 2016-06-07 DIAGNOSIS — M6281 Muscle weakness (generalized): Secondary | ICD-10-CM | POA: Diagnosis not present

## 2016-06-07 DIAGNOSIS — R262 Difficulty in walking, not elsewhere classified: Secondary | ICD-10-CM | POA: Diagnosis not present

## 2016-06-08 DIAGNOSIS — M6281 Muscle weakness (generalized): Secondary | ICD-10-CM | POA: Diagnosis not present

## 2016-06-08 DIAGNOSIS — R262 Difficulty in walking, not elsewhere classified: Secondary | ICD-10-CM | POA: Diagnosis not present

## 2016-06-10 DIAGNOSIS — M6281 Muscle weakness (generalized): Secondary | ICD-10-CM | POA: Diagnosis not present

## 2016-06-10 DIAGNOSIS — R262 Difficulty in walking, not elsewhere classified: Secondary | ICD-10-CM | POA: Diagnosis not present

## 2016-06-14 ENCOUNTER — Emergency Department: Payer: Medicare Other

## 2016-06-14 ENCOUNTER — Inpatient Hospital Stay
Admission: EM | Admit: 2016-06-14 | Discharge: 2016-06-15 | DRG: 310 | Disposition: A | Discharge status: Home / Self Care | Payer: Medicare Other | Attending: Internal Medicine | Admitting: Internal Medicine

## 2016-06-14 ENCOUNTER — Encounter: Payer: Self-pay | Admitting: Emergency Medicine

## 2016-06-14 ENCOUNTER — Telehealth: Payer: Self-pay | Admitting: Cardiovascular Disease

## 2016-06-14 DIAGNOSIS — Z7951 Long term (current) use of inhaled steroids: Secondary | ICD-10-CM

## 2016-06-14 DIAGNOSIS — Z9981 Dependence on supplemental oxygen: Secondary | ICD-10-CM

## 2016-06-14 DIAGNOSIS — Z833 Family history of diabetes mellitus: Secondary | ICD-10-CM | POA: Diagnosis not present

## 2016-06-14 DIAGNOSIS — R5383 Other fatigue: Secondary | ICD-10-CM

## 2016-06-14 DIAGNOSIS — R531 Weakness: Secondary | ICD-10-CM | POA: Diagnosis not present

## 2016-06-14 DIAGNOSIS — Z9114 Patient's other noncompliance with medication regimen: Secondary | ICD-10-CM

## 2016-06-14 DIAGNOSIS — E039 Hypothyroidism, unspecified: Secondary | ICD-10-CM | POA: Diagnosis present

## 2016-06-14 DIAGNOSIS — I480 Paroxysmal atrial fibrillation: Secondary | ICD-10-CM | POA: Diagnosis not present

## 2016-06-14 DIAGNOSIS — J479 Bronchiectasis, uncomplicated: Secondary | ICD-10-CM | POA: Diagnosis present

## 2016-06-14 DIAGNOSIS — I4891 Unspecified atrial fibrillation: Secondary | ICD-10-CM | POA: Diagnosis not present

## 2016-06-14 DIAGNOSIS — Z7901 Long term (current) use of anticoagulants: Secondary | ICD-10-CM

## 2016-06-14 LAB — URINALYSIS COMPLETE WITH MICROSCOPIC (ARMC ONLY)
BILIRUBIN URINE: NEGATIVE
Bacteria, UA: NONE SEEN
GLUCOSE, UA: NEGATIVE mg/dL
Hgb urine dipstick: NEGATIVE
Ketones, ur: NEGATIVE mg/dL
Leukocytes, UA: NEGATIVE
Nitrite: NEGATIVE
PH: 6 (ref 5.0–8.0)
Protein, ur: NEGATIVE mg/dL
Specific Gravity, Urine: 1.01 (ref 1.005–1.030)

## 2016-06-14 LAB — HEPATIC FUNCTION PANEL
ALBUMIN: 3.5 g/dL (ref 3.5–5.0)
ALK PHOS: 74 U/L (ref 38–126)
ALT: 16 U/L (ref 14–54)
AST: 24 U/L (ref 15–41)
Bilirubin, Direct: <0.1 mg/dL — ABNORMAL LOW (ref 0.1–0.5)
TOTAL PROTEIN: 7.4 g/dL (ref 6.5–8.1)
Total Bilirubin: 1 mg/dL (ref 0.3–1.2)

## 2016-06-14 LAB — PROTIME-INR
INR: 1.22
Prothrombin Time: 15.5 seconds — ABNORMAL HIGH (ref 11.4–15.2)

## 2016-06-14 LAB — MRSA PCR SCREENING: MRSA BY PCR: NEGATIVE

## 2016-06-14 LAB — CBC
HEMATOCRIT: 42.1 % (ref 35.0–47.0)
Hemoglobin: 13.8 g/dL (ref 12.0–16.0)
MCH: 31.7 pg (ref 26.0–34.0)
MCHC: 32.9 g/dL (ref 32.0–36.0)
MCV: 96.3 fL (ref 80.0–100.0)
PLATELETS: 235 10*3/uL (ref 150–440)
RBC: 4.37 MIL/uL (ref 3.80–5.20)
RDW: 14.8 % — AB (ref 11.5–14.5)
WBC: 15.1 10*3/uL — AB (ref 3.6–11.0)

## 2016-06-14 LAB — BASIC METABOLIC PANEL
Anion gap: 7 (ref 5–15)
BUN: 21 mg/dL — AB (ref 6–20)
CO2: 27 mmol/L (ref 22–32)
CREATININE: 0.64 mg/dL (ref 0.44–1.00)
Calcium: 8.8 mg/dL — ABNORMAL LOW (ref 8.9–10.3)
Chloride: 104 mmol/L (ref 101–111)
GFR calc Af Amer: >60 mL/min (ref >60)
GFR calc non Af Amer: >60 mL/min (ref >60)
Glucose, Bld: 89 mg/dL (ref 65–99)
POTASSIUM: 4.2 mmol/L (ref 3.5–5.1)
SODIUM: 138 mmol/L (ref 135–145)

## 2016-06-14 LAB — TROPONIN I: Troponin I: <0.03 ng/mL (ref <0.03)

## 2016-06-14 LAB — MAGNESIUM: MAGNESIUM: 2 mg/dL (ref 1.7–2.4)

## 2016-06-14 LAB — APTT: APTT: 52 s — AB (ref 24–36)

## 2016-06-14 MED ORDER — HEPARIN BOLUS VIA INFUSION
2450.0000 [IU] | Freq: Once | INTRAVENOUS | Status: DC
  Filled 2016-06-14: qty 2450

## 2016-06-14 MED ORDER — FLECAINIDE ACETATE 50 MG PO TABS
50.0000 mg | ORAL_TABLET | Freq: Two times a day (BID) | ORAL | Status: DC
  Administered 2016-06-14 – 2016-06-15 (×2): 50 mg via ORAL
  Filled 2016-06-14 (×3): qty 1

## 2016-06-14 MED ORDER — HEPARIN BOLUS VIA INFUSION
2500.0000 [IU] | Freq: Once | INTRAVENOUS | Status: DC
  Filled 2016-06-14: qty 2500

## 2016-06-14 MED ORDER — DIGOXIN 125 MCG PO TABS
0.2500 mg | ORAL_TABLET | Freq: Every day | ORAL | Status: DC
  Administered 2016-06-15: 0.25 mg via ORAL
  Filled 2016-06-14: qty 2

## 2016-06-14 MED ORDER — FLUTICASONE PROPIONATE 50 MCG/ACT NA SUSP
1.0000 | Freq: Every day | NASAL | Status: DC
  Filled 2016-06-14: qty 16

## 2016-06-14 MED ORDER — BUDESONIDE 0.5 MG/2ML IN SUSP
0.5000 mg | Freq: Two times a day (BID) | RESPIRATORY_TRACT | Status: DC
  Administered 2016-06-14 – 2016-06-15 (×2): 0.5 mg via RESPIRATORY_TRACT
  Filled 2016-06-14 (×2): qty 2

## 2016-06-14 MED ORDER — ARFORMOTEROL TARTRATE 15 MCG/2ML IN NEBU
15.0000 ug | INHALATION_SOLUTION | Freq: Two times a day (BID) | RESPIRATORY_TRACT | Status: DC
  Administered 2016-06-15: 15 ug via RESPIRATORY_TRACT
  Filled 2016-06-14 (×2): qty 2

## 2016-06-14 MED ORDER — PREDNISOLONE ACETATE 1 % OP SUSP
1.0000 [drp] | Freq: Three times a day (TID) | OPHTHALMIC | Status: DC
  Filled 2016-06-14: qty 1

## 2016-06-14 MED ORDER — HEPARIN (PORCINE) IN NACL 100-0.45 UNIT/ML-% IJ SOLN
900.0000 [IU]/h | INTRAMUSCULAR | Status: DC
  Administered 2016-06-14: 700 [IU]/h via INTRAVENOUS
  Filled 2016-06-14 (×2): qty 250

## 2016-06-14 MED ORDER — LEVOTHYROXINE SODIUM 50 MCG PO TABS
75.0000 ug | ORAL_TABLET | Freq: Every day | ORAL | Status: DC
  Administered 2016-06-15: 75 ug via ORAL
  Filled 2016-06-14: qty 2

## 2016-06-14 NOTE — Consult Note (Signed)
ANTICOAGULATION CONSULT NOTE - Initial Consult  Pharmacy Consult for heparin drip Indication: atrial fibrillation  Allergies  Allergen Reactions  . Ciprofloxacin Other (See Comments)    c-diff  . Isoniazid     Hepatitis, weakness, nausea  . Moxifloxacin     Avelox  REACTION: chills, fainting    Patient Measurements: Height: 5\' 8"  (172.7 cm) Weight: 110 lb (49.9 kg) IBW/kg (Calculated) : 63.9 Heparin Dosing Weight: 49.9kg  Vital Signs: BP: 115/74 (08/07 1330) Pulse Rate: 93 (08/07 1330)  Labs:  Recent Labs  06/14/16 1138  HGB 13.8  HCT 42.1  PLT 235  CREATININE 0.64  TROPONINI <0.03    Estimated Creatinine Clearance: 43.4 mL/min (by C-G formula based on SCr of 0.8 mg/dL).   Medical History: Past Medical History:  Diagnosis Date  . Atrial fibrillation ([place])   . Bacterial infection due to Pseudomonas   . Bronchiectasis   . COPD (chronic obstructive pulmonary disease) ([place])    . Cough    from bronchiectasis neb txs  . Dysrhythmia    A Fib  . Hepatitis    h/o INH(isoniazide)  . History of Mycobacterium avium complex infection    lung disease  . Hypothyroidism   . Pleurisy 12/08/11  . Shortness of breath dyspnea     Medications:  Scheduled:  . heparin  2,500 Units Intravenous Once    Assessment: Pt is a 82 year old female who presents in Afib. Pt has a hx of afib. States that cardiologist gave her samples of pradaxa and she is only supposed to take when she is in afib. States she took one capsule yesterday around 1600. Had not taken in probably a year prior to that. Baseline APTT and INR ordered as add on. No HL needed since pradaxa does not effect anti-xa levels. It has been >12hr since last dose therefore can start heparin drip right away.   Goal of Therapy:  Heparin level 0.3-0.7 units/ml Monitor platelets by anticoagulation protocol: Yes   Plan:  Give 2450 units bolus x 1 Start heparin infusion at 700 units/hr Check anti-Xa level in 8 hours and  daily while on heparin Continue to monitor H&H and platelets  Mery Guadalupe D Lisbeth Puller, Pharm.D Clinical Pharmacist  06/14/2016,3:23 PM

## 2016-06-14 NOTE — ED Notes (Signed)
Pt to xray at this time.

## 2016-06-14 NOTE — ED Provider Notes (Signed)
[facility] Emergency Department Provider Note    First MD Initiated Contact with Patient 06/14/16 1132     (approximate)  I have reviewed the triage vital signs and the nursing notes.   HISTORY  Chief Complaint Tachycardia and Fatigue    HPI Hannah Kline is a 82 y.o. female with history of bronchiectasis, COPD and paroxysmal A. fib presents with 1 day of weakness and palpitations. Checking her pulse oximeter and noted that her heart rate was fluctuating from the 70s to 115. She has felt weak over the past 24 hours but denies any chest pain. Denies any recent fevers. Does admit to a chronic cough. No increased oxygen demand. Denies any dysuria. No weight gain.   Past Medical History:  Diagnosis Date  . Atrial fibrillation ([place])   . Bacterial infection due to Pseudomonas   . Bronchiectasis   . COPD (chronic obstructive pulmonary disease) ([place])    . Cough    from bronchiectasis neb txs  . Dysrhythmia    A Fib  . Hepatitis    h/o INH(isoniazide)  . History of Mycobacterium avium complex infection    lung disease  . Hypothyroidism   . Pleurisy 12/08/11  . Shortness of breath dyspnea     Patient Active Problem List   Diagnosis Date Noted  . Weakness generalized 05/27/2016  . Excessive cerumen in both ear canals 02/11/2016  . Malaise and fatigue 11/24/2014  . Glucose intolerance (pre-diabetes) 11/24/2014  . Hypothyroidism 09/02/2014  . Anorexia 07/14/2014  . Protein-calorie malnutrition, mild ([place]) 07/14/2014  . Abnormal mammogram 02/19/2014  . Osteoporosis 02/14/2014  . Visit for preventive health examination 01/08/2014  . Colitis, Clostridium difficile 11/23/2013  . Bronchiectasis with acute exacerbation ([place]) 10/24/2013  . Rhinitis, nonallergic 10/10/2012  . Orthostatic hypotension 09/22/2012  . Hypoxemia requiring supplemental oxygen 02/21/2012  . Chronic sinusitis 01/03/2012  . Pulmonary nodule 01/03/2012  . Screening for colon cancer  10/05/2011  . Screening for breast cancer 10/05/2011  . Bacterial infection due to Pseudomonas   . Atrial fibrillation ([place]) 09/15/2010  . BRONCHIECTASIS 09/15/2010  . Chronic bronchitis ([place]) 09/15/2010    Past Surgical History:  Procedure Laterality Date  . CATARACT EXTRACTION W/PHACO Left 01/26/2016   Procedure: CATARACT EXTRACTION PHACO AND INTRAOCULAR LENS PLACEMENT (IOC);  Surgeon: Ronnell Freshwater, MD;  Location: [facility];  Service: Ophthalmology;  Laterality: Left;  . COLONOSCOPY    . SHOULDER ARTHROSCOPY     right    Prior to Admission medications   Medication Sig Start Date End Date Taking? Authorizing Provider  arformoterol (BROVANA) 15 MCG/2ML NEBU Take 2 mLs (15 mcg total) by nebulization 2 (two) times daily. 03/09/16   Juanito Doom, MD  budesonide (PULMICORT) 0.5 MG/2ML nebulizer solution Take 2 mLs (0.5 mg total) by nebulization 2 (two) times daily. 03/09/16   Juanito Doom, MD  dabigatran (PRADAXA) 150 MG CAPS capsule Take 150 mg by mouth as needed. Reported on 05/27/2016 03/05/13   Minna Merritts, MD  digoxin (LANOXIN) 0.25 MG tablet Take 1 tablet (0.25 mg total) by mouth daily as needed. 01/12/16   Minna Merritts, MD  flecainide (TAMBOCOR) 50 MG tablet TAKE 1 TABLET BY MOUTH TWICE A DAY 04/23/16   Minna Merritts, MD  levothyroxine (SYNTHROID, LEVOTHROID) 75 MCG tablet Take 1 tablet (75 mcg total) by mouth daily. 02/27/16   Crecencio Mc, MD  mometasone (NASONEX) 50 MCG/ACT nasal spray Place 2 sprays into the nose daily as  needed. 12/12/13 04/01/16  Juanito Doom, MD  OXYGEN Inhale 2 L into the lungs. [place] - when exercising, walking around, and at night    Historical Provider, MD  Respiratory Therapy Supplies (FLUTTER) DEVI Use as directed 07/21/15   Tanda Rockers, MD    Allergies Isoniazid and Moxifloxacin  Family History  Problem Relation Age of Onset  . Angina Mother   . Other Mother     intestional blockage  . Diabetes Maternal  Grandfather     Social History Social History  Substance Use Topics  . Smoking status: Never Smoker  . Smokeless tobacco: Never Used  . Alcohol use 4.2 - 4.8 oz/week    7 Glasses of wine per week    Review of Systems Patient denies headaches, rhinorrhea, blurry vision, numbness, shortness of breath, chest pain, edema, cough, abdominal pain, nausea, vomiting, diarrhea, dysuria, fevers, rashes or hallucinations unless otherwise stated above in HPI. ____________________________________________   PHYSICAL EXAM:  VITAL SIGNS: Vitals:   06/14/16 1200 06/14/16 1231  BP: (!) 105/39 127/67  Pulse: (!) 108 89  Resp: (!) 24 (!) 22   Constitutional: Alert and oriented. Frail and fatigued appearing Eyes: Conjunctivae are normal. PERRL. EOMI. Head: Atraumatic. Nose: No congestion/rhinnorhea. Mouth/Throat: Mucous membranes are moist.  Oropharynx non-erythematous. Neck: No stridor. Painless ROM. No cervical spine tenderness to palpation Hematological/Lymphatic/Immunilogical: No cervical lymphadenopathy. Cardiovascular: Normal rate, regular rhythm. Grossly normal heart sounds.  Good peripheral circulation. Respiratory: Normal respiratory effort. corase inspiratory crackles bilaterally Gastrointestinal: Soft and nontender. No distention. No abdominal bruits. No CVA tenderness.  Musculoskeletal: No lower extremity tenderness nor edema.  No joint effusions. Neurologic:  Normal speech and language. No gross focal neurologic deficits are appreciated. No gait instability. Skin:  Skin is warm, dry and intact. No rash noted. Psychiatric: Mood and affect are normal. Speech and behavior are normal.  ____________________________________________   LABS (all labs ordered are listed, but only abnormal results are displayed)  Results for orders placed or performed during the hospital encounter of 06/14/16 (from the past 24 hour(s))  Basic metabolic panel     Status: Abnormal   Collection Time:  06/14/16 11:38 AM  Result Value Ref Range   Sodium 138 135 - 145 mmol/L   Potassium 4.2 3.5 - 5.1 mmol/L   Chloride 104 101 - 111 mmol/L   CO2 27 22 - 32 mmol/L   Glucose, Bld 89 65 - 99 mg/dL   BUN 21 (H) 6 - 20 mg/dL   Creatinine, Ser 0.64 0.44 - 1.00 mg/dL   Calcium 8.8 (L) 8.9 - 10.3 mg/dL   GFR calc non Af Amer >60 >60 mL/min   GFR calc Af Amer >60 >60 mL/min   Anion gap 7 5 - 15  CBC     Status: Abnormal   Collection Time: 06/14/16 11:38 AM  Result Value Ref Range   WBC 15.1 (H) 3.6 - 11.0 K/uL   RBC 4.37 3.80 - 5.20 MIL/uL   Hemoglobin 13.8 12.0 - 16.0 g/dL   HCT 42.1 35.0 - 47.0 %   MCV 96.3 80.0 - 100.0 fL   MCH 31.7 26.0 - 34.0 pg   MCHC 32.9 32.0 - 36.0 g/dL   RDW 14.8 (H) 11.5 - 14.5 %   Platelets 235 150 - 440 K/uL   ____________________________________________  EKG  Time: 11:25  Indication: tachycardia  Rate: 95  Rhythm: sinus with sinus arrhythmia Axis: normal Other: non specific ST changes, no acute ischemia ____________________________________________  RADIOLOGY  CXR  IMPRESSION: 1. No acute cardiopulmonary abnormalities. 2. Advanced changes of chronic lung disease. No superimposed pneumonia. ____________________________________________   PROCEDURES  Procedure(s) performed: none    Critical Care performed: no ____________________________________________   INITIAL IMPRESSION / ASSESSMENT AND PLAN / ED COURSE  Pertinent labs & imaging results that were available during my care of the patient were reviewed by me and considered in my medical decision making (see chart for details).  DDX: afib, electrolyte abnormality, acs, heart failure, pulm hypertension  Cielo Schneiter is a 82 y.o. who presents to the ED with 24 hours of weakness and palpitations in the setting of paroxysmal A. fib. Patient currently hemodynamically stable. Denies any fevers. Otherwise A symptomatically denies any chest pain or shortness of breath but does feel generalized  malaise. I will check blood work as well as imaging and EKG to evaluate for the above complaints..  Patient's presentation is complicated by her underlying lung pathology which does put her at increased risk for pathology such as pulmonary hypertension which could be contributing to presentation but does not have any evidence of acute hypoxic respiratory failure at this time  Clinical Course  Comment By Time  Labs reviewed.  No evidence of UTI.  Mild leukocytosis but patient is afebrile at this time.  Abdominal  Merlyn Lot, MD 08/07 1422   ----------------------------------------- 3:15 PM on 06/14/2016 -----------------------------------------  Spoke with cardiology regarding the patient's paroxysmal A. fib and the fact that she is not on anticoagulation.  Based on her weakness and palpitations and paroxysmal A. fib not on anticoagulation do feel that it be appropriate to observe patient in the hospital for further medical titration and keep her on cardiac monitoring. Discussed these findings with the patient as well as hospitalist patient agrees to admission hospital for heparin infusion and further discussion of medical management.  ____________________________________________   FINAL CLINICAL IMPRESSION(S) / ED DIAGNOSES  Final diagnoses:  Paroxysmal a-fib ([place])  Other fatigue      NEW MEDICATIONS STARTED DURING THIS VISIT:  New Prescriptions   No medications on file     Note:  This document was prepared using Dragon voice recognition software and may include unintentional dictation errors.    Merlyn Lot, MD 06/14/16 [phone]

## 2016-06-14 NOTE — ED Triage Notes (Signed)
Pt presents with fast heart rate and feeling weak started yesterday. Pt states she has AFIB and has had to use her oxygen more than normal. Cardiologist not able to see pt today.

## 2016-06-14 NOTE — Telephone Encounter (Signed)
S/w pt who reports HR fluctuating 95-112 since yesterday morning. Denies any other sx. She took digoxin and pradaxa twice as well as her scheduled flecainide  w/no improvement. Typically taking these medications improve sx. She reports being weak as when she is in afib and is concerned for what she should do. Reports HR currently 123.  As Dr. Rockey Situ is out of the office, HR is elevated and she has tried medications with no improvement, I have advised pt to be seen in the ER. Her husband is home and can transport patient. She verbalized understanding and is agreeable w/plan. She will proceed to the ER at this time.   Will forward to MD basket to make aware.

## 2016-06-14 NOTE — H&P (Signed)
[place] [facility] [place] NAME: Hannah Kline    MR#:  [id number]  DATE OF BIRTH:  Nov 29, 1931  DATE OF ADMISSION:  06/14/2016  PRIMARY CARE PHYSICIAN: Crecencio Mc, MD   REQUESTING/REFERRING PHYSICIAN: Dr. Merlyn Lot  CHIEF COMPLAINT: Generalized weakness    Chief Complaint  Patient presents with  . Tachycardia  . Fatigue    HISTORY OF PRESENT ILLNESS:  Hannah Kline  is a 82 y.o. female with a known history of Paroxysmal A. fib, bronchiectasis, recent glaucoma operation comes in with generalized weakness, heart rate up to 1 10 bpm in  Paroxysmal atrial fibrillation. Patient has history of parysmal atrial fibrillation, with heart rate in 80s at baseline, found to have heart rate up to 110 beats associated with generalized weakness since yesterday. Patient also felt dizzy. Patient takes flecainide on regular basis / but she does not take Pradaxa digoxin on regular basis. Took  Pradaxa, digoxin yesterday.  PAST MEDICAL HISTORY:   Past Medical History:  Diagnosis Date  . Atrial fibrillation ([place])   . Bacterial infection due to Pseudomonas   . Bronchiectasis   . COPD (chronic obstructive pulmonary disease) ([place])    . Cough    from bronchiectasis neb txs  . Dysrhythmia    A Fib  . Hepatitis    h/o INH(isoniazide)  . History of Mycobacterium avium complex infection    lung disease  . Hypothyroidism   . Pleurisy 12/08/11  . Shortness of breath dyspnea     PAST SURGICAL HISTOIRY:   Past Surgical History:  Procedure Laterality Date  . CATARACT EXTRACTION W/PHACO Left 01/26/2016   Procedure: CATARACT EXTRACTION PHACO AND INTRAOCULAR LENS PLACEMENT (IOC);  Surgeon: Ronnell Freshwater, MD;  Location: [facility];  Service: Ophthalmology;  Laterality: Left;  . COLONOSCOPY    . SHOULDER ARTHROSCOPY     right    SOCIAL HISTORY:   Social History  Substance Use Topics  . Smoking status: Never Smoker  . Smokeless  tobacco: Never Used  . Alcohol use 4.2 - 4.8 oz/week    7 Glasses of wine per week    FAMILY HISTORY:   Family History  Problem Relation Age of Onset  . Angina Mother   . Other Mother     intestional blockage  . Diabetes Maternal Grandfather     DRUG ALLERGIES:   Allergies  Allergen Reactions  . Ciprofloxacin Other (See Comments)    c-diff  . Isoniazid     Hepatitis, weakness, nausea  . Moxifloxacin     Avelox  REACTION: chills, fainting    REVIEW OF SYSTEMS:  CONSTITUTIONAL:Feels weak.  EYES: No blurred or double vision.  EARS, NOSE, AND THROAT: No tinnitus or ear pain.  RESPIRATORY:Has chronic cough and shortness of breath secondary to bronchiectasis..no  hemoptysis.  CARDIOVASCULAR: No chest pain, orthopnea, edema.  GASTROINTESTINAL: No nausea, vomiting, diarrhea or abdominal pain.  GENITOURINARY: No dysuria, hematuria.  ENDOCRINE: No polyuria, nocturia,  HEMATOLOGY: No anemia, easy bruising or bleeding SKIN: No rash or lesion. MUSCULOSKELETAL: No joint pain or arthritis.   NEUROLOGIC: No tingling, numbness, weakness.  PSYCHIATRY: No anxiety or depression.   MEDICATIONS AT HOME:   Prior to Admission medications   Medication Sig Start Date End Date Taking? Authorizing Provider  arformoterol (BROVANA) 15 MCG/2ML NEBU Take 2 mLs (15 mcg total) by nebulization 2 (two) times daily. 03/09/16  Yes Juanito Doom, MD  budesonide (PULMICORT) 0.5 MG/2ML nebulizer solution Take  2 mLs (0.5 mg total) by nebulization 2 (two) times daily. 03/09/16  Yes Juanito Doom, MD  dabigatran (PRADAXA) 150 MG CAPS capsule Take 150 mg by mouth as needed. Reported on 05/27/2016 03/05/13  Yes Minna Merritts, MD  digoxin (LANOXIN) 0.25 MG tablet Take 1 tablet (0.25 mg total) by mouth daily as needed. 01/12/16  Yes Minna Merritts, MD  flecainide (TAMBOCOR) 50 MG tablet TAKE 1 TABLET BY MOUTH TWICE A DAY 04/23/16  Yes Minna Merritts, MD  levothyroxine (SYNTHROID, LEVOTHROID) 75 MCG tablet  Take 1 tablet (75 mcg total) by mouth daily. 02/27/16  Yes Crecencio Mc, MD  mometasone (NASONEX) 50 MCG/ACT nasal spray Place 2 sprays into the nose daily as needed. 12/12/13 06/14/16 Yes Juanito Doom, MD  OXYGEN Inhale 2 L into the lungs. [place] - when exercising, walking around, and at night   Yes Historical Provider, MD  Respiratory Therapy Supplies (FLUTTER) DEVI Use as directed 07/21/15  Yes Tanda Rockers, MD      VITAL SIGNS:  Blood pressure 115/74, pulse 93, resp. rate 20, height 5\' 8"  (1.727 m), weight 49.9 kg (110 lb), SpO2 100 %.  PHYSICAL EXAMINATION:  GENERAL:  82 y.o.-year-old patient lying in the bed with no acute distress.  EYES: Pupils equal, round, reactive to light and accommodation. No scleral icterus. Extraocular muscles intact.  HEENT: Head atraumatic, normocephalic. Oropharynx and nasopharynx clear.  NECK:  Supple, no jugular venous distention. No thyroid enlargement, no tenderness.  LUNGS: Normal breath sounds bilaterally, no wheezing, rales,rhonchi or crepitation. No use of accessory muscles of respiration.  CARDIOVASCULAR: S1, S2 normal. No murmurs, rubs, or gallops.  ABDOMEN: Soft, nontender, nondistended. Bowel sounds present. No organomegaly or mass.  EXTREMITIES: No pedal edema, cyanosis, or clubbing.  NEUROLOGIC: Cranial nerves II through XII are intact. Muscle strength 5/5 in all extremities. Sensation intact. Gait not checked.  PSYCHIATRIC: The patient is alert and oriented x 3.  SKIN: No obvious rash, lesion, or ulcer.   LABORATORY PANEL:   CBC  Recent Labs Lab 06/14/16 1138  WBC 15.1*  HGB 13.8  HCT 42.1  PLT 235   ------------------------------------------------------------------------------------------------------------------  Chemistries   Recent Labs Lab 06/14/16 1138  NA 138  K 4.2  CL 104  CO2 27  GLUCOSE 89  BUN 21*  CREATININE 0.64  CALCIUM 8.8*  MG 2.0  AST 24  ALT 16  ALKPHOS 74  BILITOT 1.0    ------------------------------------------------------------------------------------------------------------------  Cardiac Enzymes  Recent Labs Lab 06/14/16 1138  TROPONINI <0.03   ------------------------------------------------------------------------------------------------------------------  RADIOLOGY:  Dg Chest 2 View  Result Date: 06/14/2016 CLINICAL DATA:  Elevated heart rate and weakness. EXAM: CHEST  2 VIEW COMPARISON:  12/05/2015 FINDINGS: Normal heart size. No pleural effusion or edema. Similar appearance of upper lobe predominant interstitial reticulation and fibrosis with upward retraction of the hila. No superimposed airspace opacities are identified. The bones appear osteopenic and there is multiple chronic appearing compression deformities within the mid thoracic spine. IMPRESSION: 1. No acute cardiopulmonary abnormalities. 2. Advanced changes of chronic lung disease. No superimposed pneumonia. Electronically Signed   By: Kerby Moors M.D.   On: 06/14/2016 12:53    EKG:   Orders placed or performed during the hospital encounter of 06/14/16  . EKG 12-Lead  . EKG 12-Lead  . ED EKG within 10 minutes  . ED EKG within 10 minutes  Sinus rhythm 96 bpm with incomplete RBBB., T-wave inversions in lead V2  IMPRESSION AND PLAN:  Generalized weakness secondary to proximal atrial fibrillation: Patient the will be admitted to telemetry, continue flecainide, digoxin. ER physician spoke to   Dr. Fletcher Anon  ,he  recommended to start the patient on heparin, consulted cardiology. Ordered echocardiogram 2,history of bronchiectasis. No pneumonia.. On chronic oxygen. 3. history of hypothyroidism; continue Synthroid. , DVT prophylaxis.    All the records are reviewed and case discussed with ED provider. Management plans discussed with the patient, family and they are in agreement.  CODE STATUS: Full code  TOTAL TIME TAKING CARE OF THIS PATIENT:55 minutes.    Hannah Kline  M.D on 06/14/2016 at 4:01 PM  Between 7am to 6pm - Pager - [phone]  After 6pm go to [email or web address] - password [email or web address] [facility]  Office  [phone]  CC: Primary care physician; Crecencio Mc, MD  Note: This dictation was prepared with Dragon dictation along with smaller phrase technology. Any transcriptional errors that result from this process are unintentional.

## 2016-06-14 NOTE — Telephone Encounter (Signed)
New message      Pt states her heart rate is 100. Her heart rate has been irregular since about 5 yesterday.  Please advise

## 2016-06-15 ENCOUNTER — Telehealth: Payer: Self-pay | Admitting: *Deleted

## 2016-06-15 ENCOUNTER — Telehealth: Payer: Self-pay

## 2016-06-15 DIAGNOSIS — I480 Paroxysmal atrial fibrillation: Principal | ICD-10-CM

## 2016-06-15 LAB — CBC
HCT: 37.4 % (ref 35.0–47.0)
Hemoglobin: 12.8 g/dL (ref 12.0–16.0)
MCH: 32.5 pg (ref 26.0–34.0)
MCHC: 34.3 g/dL (ref 32.0–36.0)
MCV: 94.8 fL (ref 80.0–100.0)
PLATELETS: 212 10*3/uL (ref 150–440)
RBC: 3.95 MIL/uL (ref 3.80–5.20)
RDW: 14.8 % — ABNORMAL HIGH (ref 11.5–14.5)
WBC: 11 10*3/uL (ref 3.6–11.0)

## 2016-06-15 LAB — HEPARIN LEVEL (UNFRACTIONATED)
HEPARIN UNFRACTIONATED: 0.29 [IU]/mL — AB (ref 0.30–0.70)
HEPARIN UNFRACTIONATED: 0.11 [IU]/mL — AB (ref 0.30–0.70)

## 2016-06-15 MED ORDER — DABIGATRAN ETEXILATE MESYLATE 150 MG PO CAPS: 150.0000 mg | ORAL_CAPSULE | Freq: Two times a day (BID) | ORAL | 0 refills | Status: DC

## 2016-06-15 MED ORDER — HEPARIN BOLUS VIA INFUSION
1500.0000 [IU] | Freq: Once | INTRAVENOUS | Status: AC
  Administered 2016-06-15: 1500 [IU] via INTRAVENOUS
  Filled 2016-06-15: qty 1500

## 2016-06-15 NOTE — Consult Note (Signed)
ANTICOAGULATION CONSULT NOTE - Initial Consult  Pharmacy Consult for heparin drip Indication: atrial fibrillation  Allergies  Allergen Reactions  . Ciprofloxacin Other (See Comments)    c-diff  . Isoniazid     Hepatitis, weakness, nausea  . Moxifloxacin     Avelox  REACTION: chills, fainting    Patient Measurements: Height: 5\' 8"  (172.7 cm) Weight: 110 lb (49.9 kg) IBW/kg (Calculated) : 63.9 Heparin Dosing Weight: 49.9kg  Vital Signs: Temp: 97.8 F (36.6 C) (08/07 1959) Temp Source: Oral (08/07 1959) BP: 130/52 (08/07 1959) Pulse Rate: 100 (08/07 1959)  Labs:  Recent Labs  06/14/16 1138 06/15/16 0025  HGB 13.8  --   HCT 42.1  --   PLT 235  --   APTT 52*  --   LABPROT 15.5*  --   INR 1.22  --   HEPARINUNFRC  --  0.11*  CREATININE 0.64  --   TROPONINI <0.03  --     Estimated Creatinine Clearance: 43.4 mL/min (by C-G formula based on SCr of 0.8 mg/dL).   Medical History: Past Medical History:  Diagnosis Date  . Atrial fibrillation ([place])   . Bacterial infection due to Pseudomonas   . Bronchiectasis   . COPD (chronic obstructive pulmonary disease) ([place])    . Cough    from bronchiectasis neb txs  . Dysrhythmia    A Fib  . Hepatitis    h/o INH(isoniazide)  . History of Mycobacterium avium complex infection    lung disease  . Hypothyroidism   . Pleurisy 12/08/11  . Shortness of breath dyspnea     Medications:  Scheduled:  . arformoterol  15 mcg Nebulization BID  . budesonide  0.5 mg Nebulization BID  . digoxin  0.25 mg Oral Daily  . flecainide  50 mg Oral BID  . fluticasone  1 spray Each Nare Daily  . levothyroxine  75 mcg Oral QAC breakfast    Assessment: Pt is a 82 year old female who presents in Afib. Pt has a hx of afib. States that cardiologist gave her samples of pradaxa and she is only supposed to take when she is in afib. States she took one capsule yesterday around 1600. Had not taken in probably a year prior to that. Baseline APTT and  INR ordered as add on. No HL needed since pradaxa does not effect anti-xa levels. It has been >12hr since last dose therefore can start heparin drip right away.   Goal of Therapy:  Heparin level 0.3-0.7 units/ml Monitor platelets by anticoagulation protocol: Yes   Plan:  Give 2450 units bolus x 1 Start heparin infusion at 700 units/hr Check anti-Xa level in 8 hours and daily while on heparin Continue to monitor H&H and platelets   06/16/99:30 heparin level 0.11. 1500 unit bolus and increase rate to 900 units/hr. Recheck in 8 hours.  Ramond Dial, Pharm.D Clinical Pharmacist  06/15/2016,1:55 AM

## 2016-06-15 NOTE — Consult Note (Signed)
Cardiology Consultation   Patient ID: Hannah Kline; [id number]; April 15, 1932   Admit date: 06/14/2016 Date of Consult: 06/15/2016  Referring MD: Dr. Vianne Bulls  Patient Care Team: Crecencio Mc, MD as PCP - General (Internal Medicine) Minna Merritts, MD as Consulting Physician (Cardiology)    Reason for Consultation: Paroxysmal atrial fibrillation  History of Present Illness: Hannah Kline is a 82 y.o. female with a hx of paroxysmal atrial fibrillation. Patient sees Dr. Rockey Situ in the office. Last routine visit was 01/12/2016. Per review of medical records, it appears that the last episode of atrial fibrillation was sometime in April 27-29 of 2014.  Patient reports that yesterday, she did not feel well and routinely checked her oximeter. This recorded a heart rate in the 90s. Cardiac Baseline heart rate is in the 60s to 70s. After minimal exercise, usually hovers in the 80s. The fact that it was in the 90s 200s was unusual for her. And also because she had not been feeling well for the last several weeks, she decided to call the cardiology office. Because she reported that she was feeling poorly and there had been a change in the heart rate, she was advised to go to the ER for further evaluation.  Reviewing available EKGs from yesterday in the ER: Notable sinus rhythm. No evidence of atrophic ablation on EKG nor on telemetry review.  Patient denies chest pain. She has chronic shortness of breath because of bronchiectasis. She has had issues with eyes surgery beginning with a cataract surgery back in March. She ended up seeing a specialist at [facility] and needed some procedure for glaucoma. Per patient, that ended up being a complicated procedure which required her to come back for a follow-up procedure. This happened 3 weeks ago. Since then she has been feeling poorly with poor energy. It is not clear whether her shortness of breath has gotten any worse than her baseline.  ROS:  Please see the history of  present illness.  ROS All other ROS reviewed and negative.    Past Medical History:  Diagnosis Date  . Atrial fibrillation ([place])   . Bacterial infection due to Pseudomonas   . Bronchiectasis   . COPD (chronic obstructive pulmonary disease) ([place])    . Cough    from bronchiectasis neb txs  . Dysrhythmia    A Fib  . Hepatitis    h/o INH(isoniazide)  . History of Mycobacterium avium complex infection    lung disease  . Hypothyroidism   . Pleurisy 12/08/11  . Shortness of breath dyspnea     Past Surgical History:  Procedure Laterality Date  . CATARACT EXTRACTION W/PHACO Left 01/26/2016   Procedure: CATARACT EXTRACTION PHACO AND INTRAOCULAR LENS PLACEMENT (IOC);  Surgeon: Ronnell Freshwater, MD;  Location: [place];  Service: Ophthalmology;  Laterality: Left;  . COLONOSCOPY    . SHOULDER ARTHROSCOPY     right      Home Meds: Prior to Admission medications   Medication Sig Start Date End Date Taking? Authorizing Provider  arformoterol (BROVANA) 15 MCG/2ML NEBU Take 2 mLs (15 mcg total) by nebulization 2 (two) times daily. 03/09/16  Yes Juanito Doom, MD  budesonide (PULMICORT) 0.5 MG/2ML nebulizer solution Take 2 mLs (0.5 mg total) by nebulization 2 (two) times daily. 03/09/16  Yes Juanito Doom, MD  dabigatran (PRADAXA) 150 MG CAPS capsule Take 150 mg by mouth as needed. Reported on 05/27/2016 03/05/13  Yes Minna Merritts, MD  digoxin (LANOXIN) 0.25 MG tablet Take  1 tablet (0.25 mg total) by mouth daily as needed. 01/12/16  Yes Minna Merritts, MD  flecainide (TAMBOCOR) 50 MG tablet TAKE 1 TABLET BY MOUTH TWICE A DAY 04/23/16  Yes Minna Merritts, MD  levothyroxine (SYNTHROID, LEVOTHROID) 75 MCG tablet Take 1 tablet (75 mcg total) by mouth daily. 02/27/16  Yes Crecencio Mc, MD  mometasone (NASONEX) 50 MCG/ACT nasal spray Place 2 sprays into the nose daily as needed. 12/12/13 06/14/16 Yes Juanito Doom, MD  OXYGEN Inhale 2 L into the lungs. [place] - when exercising,  walking around, and at night   Yes Historical Provider, MD  Respiratory Therapy Supplies (FLUTTER) DEVI Use as directed 07/21/15  Yes Tanda Rockers, MD    Current Medications: . arformoterol  15 mcg Nebulization BID  . budesonide  0.5 mg Nebulization BID  . digoxin  0.25 mg Oral Daily  . flecainide  50 mg Oral BID  . fluticasone  1 spray Each Nare Daily  . levothyroxine  75 mcg Oral QAC breakfast     Allergies:    Allergies  Allergen Reactions  . Ciprofloxacin Other (See Comments)    c-diff  . Isoniazid     Hepatitis, weakness, nausea  . Moxifloxacin     Avelox  REACTION: chills, fainting    Social History:   The patient  reports that she has never smoked. She has never used smokeless tobacco. She reports that she drinks about 4.2 - 4.8 oz of alcohol per week . She reports that she does not use drugs.    Family History:   The patient's family history includes Angina in her mother; Diabetes in her maternal grandfather; Other in her mother.       PHYSICAL EXAM: VS:  BP (!) 108/56 (BP Location: Left Arm)   Pulse 76   Temp 97.4 F (36.3 C) (Oral)   Resp 18   Ht 5\' 8"  (1.727 m)   Wt 110 lb (49.9 kg)   SpO2 100%   BMI 16.73 kg/m  , BMI Body mass index is 16.73 kg/m. GENERAL:  well developed, thin appearing, not in acute distress HEENT: normocephalic, pink conjunctivae, anicteric sclerae, no xanthelasma, normal dentition, oropharynx clear NECK:  no neck vein engorgement, JVP normal, no hepatojugular reflux, carotid upstroke brisk and symmetric, no bruit, no thyromegaly, no lymphadenopathy LUNGS:  good respiratory effort, clear to auscultation bilaterally CV:  PMI not displaced, no thrills, no lifts, S1 and S2 within normal limits, no palpable S3 or S4, no murmurs, no rubs, no gallops ABD:  Soft, nontender, nondistended, normoactive bowel sounds, no abdominal aortic bruit, no hepatomegaly, no splenomegaly MS: nontender back, no kyphosis, no scoliosis, no joint  deformities EXT:  2+ DP/PT pulses, no edema, no varicosities, no cyanosis, no clubbing SKIN: warm, nondiaphoretic, normal turgor, no ulcers NEUROPSYCH: alert, oriented to person, place, and time, sensory/motor grossly intact, normal mood, appropriate affect    EKG: EKG from 06/14/2016 1125 was personally reviewed by me. This showed sinus rhythm 96 BPM, first-degree AV block with PR interval 210 ms. Incomplete right bundle-branch block and LAFB. QTC was within normal limits. QRS was 110 ms. Comparing to previous EKGs, no significant change from March 2017. From July 2016, there was note of first-degree AV block then.  Telemetry rhythm strips were reviewed and showed sinus rhythm, sinus tachycardia. Do not see any evidence the patient had any bursts of atrial fibrillation.  Labs:  Recent Labs  06/14/16 1138  TROPONINI <0.03   Lab  Results  Component Value Date   WBC 15.1 (H) 06/14/2016   HGB 13.8 06/14/2016   HCT 42.1 06/14/2016   MCV 96.3 06/14/2016   PLT 235 06/14/2016    Recent Labs Lab 06/14/16 1138  NA 138  K 4.2  CL 104  CO2 27  BUN 21*  CREATININE 0.64  CALCIUM 8.8*  PROT 7.4  BILITOT 1.0  ALKPHOS 74  ALT 16  AST 24  GLUCOSE 89   Lab Results  Component Value Date   CHOL 177 01/22/2016   HDL 53.50 01/22/2016   LDLCALC 108 (H) 01/22/2016   TRIG 78.0 01/22/2016   No results found for: DDIMER  Radiology/Studies:  Dg Chest 2 View  Result Date: 06/14/2016 CLINICAL DATA:  Elevated heart rate and weakness. EXAM: CHEST  2 VIEW COMPARISON:  12/05/2015 FINDINGS: Normal heart size. No pleural effusion or edema. Similar appearance of upper lobe predominant interstitial reticulation and fibrosis with upward retraction of the hila. No superimposed airspace opacities are identified. The bones appear osteopenic and there is multiple chronic appearing compression deformities within the mid thoracic spine. IMPRESSION: 1. No acute cardiopulmonary abnormalities. 2. Advanced  changes of chronic lung disease. No superimposed pneumonia. Electronically Signed   By: Kerby Moors M.D.   On: 06/14/2016 12:53     PROBLEM LIST:  Active Problems:   Paroxysmal a-fib (HCC)     ASSESSMENT AND PLAN:  History of paroxysmal atrial fibrillation, currently in sinus rhythm EKGs from the ER yesterday did not reveal any evidence of atrial fibrillation. Patient was concerned about the heart rate going up into the 90s to the 100s. She did not actually feel that she may have gone out of rhythm. She rarely has A. fib. She does not exactly recall the last episode. Per medical records, it may have been back in April 2014. Currently, her heart rate is in the 70s.  Patient does not have chest pain. Troponin was negative. Per primary cardiologist's advice, she is on flecainide 50mg  po BID. she takes digoxin and possibly next dose of flecainide, also a dose of pradaxa when she feels that she goes into atrial for ablation. Will defer to primary cardiologist review of this treatment strategy. May benefit from echocardiogram. This may be done as an outpatient.  Total encounter time of 80 minutes.  Signed, Wende Bushy, MD  06/15/2016 10:10 AM  [place]

## 2016-06-15 NOTE — Plan of Care (Signed)
Problem: Education: Goal: Knowledge of disease or condition will improve Outcome: Progressing Patient stated that she follows Doctor's order to improve Dx

## 2016-06-15 NOTE — Plan of Care (Signed)
Dr. Text to request re-assessment of anti-coag needs. Patient currently on Heparin drip? Labs: PT 15.5 and PTT 52 sec.  Per Cardio's assessment - no AFIB noted in chart (Cardio ok with discharge). Also informed of patient/family urgent desire to discharge home today if possible.

## 2016-06-15 NOTE — Telephone Encounter (Signed)
Patient contacted regarding discharge from [facility]  on 06/15/2016.  Patient understands to follow up with provider Dr. Rockey Situ on 07/07/16 at 3:20 pm at [facility]. Patient understands discharge instructions? yes Patient understands medications and regiment? yes Patient understands to bring all medications to this visit? Yes  Patient verbalized understanding of all instructions and had no further questions at this time.

## 2016-06-15 NOTE — Telephone Encounter (Signed)
-----   Message from Valora Corporal, RN sent at 06/15/2016  1:57 PM EDT ----- Could we get her in sooner? Thanks    ----- Message ----- From: Wende Bushy, MD Sent: 06/15/2016   1:16 PM To: Valora Corporal, RN, Stana Bunting, RN, #  Pt would like to see Dr. Rockey Situ sooner than scheduled visit; post hospital d/c Thank you

## 2016-06-15 NOTE — Telephone Encounter (Signed)
l message with spouse, Herbie [place], patient was sleeping. She will call us back to schedule sooner appointment.

## 2016-06-15 NOTE — Discharge Planning (Signed)
Patient IV and tele removed.  Discharge papers given, explained and educated.  Discussed changed in medications and safety with active blood thinner meds.  FU appts made and informed.  Told of script sent to [facility].  Patient will be resuming Pradaxa at home till FU with Cardio. Patient will be wheeled to front and family transporting home via car once ready.

## 2016-06-15 NOTE — Plan of Care (Signed)
Problem: Cardiac: Goal: Ability to achieve and maintain adequate cardiopulmonary perfusion will improve Outcome: Progressing Patient has chronic lung disease. Currently on O2 2L(CHRONIC).

## 2016-06-15 NOTE — Telephone Encounter (Signed)
-----   Message from Wende Bushy, MD sent at 06/15/2016  1:16 PM EDT ----- Pt would like to see Dr. Rockey Situ sooner than scheduled visit; post hospital d/c Thank you

## 2016-06-15 NOTE — Discharge Summary (Signed)
Hannah Kline, is a 82 y.o. female  DOB [birth year]  MRN [id number].  Admission date:  06/14/2016  Admitting Physician  Epifanio Lesches, MD  Discharge Date:  06/15/2016   Primary MD  Crecencio Mc, MD  Recommendations for primary care physician for things to follow:   Follow-up with primary cardiologist, Dr. Octavia Bruckner: Rockey Situ next month . Admission Diagnosis  Paroxysmal a-fib (HCC) [I48.0] Other fatigue [R53.83]   Discharge Diagnosis  Paroxysmal a-fib ([place]) [I48.0] Other fatigue [R53.83]    Active Problems:   Paroxysmal a-fib (HCC)      Past Medical History:  Diagnosis Date  . Atrial fibrillation ([place])   . Bacterial infection due to Pseudomonas   . Bronchiectasis   . COPD (chronic obstructive pulmonary disease) ([place])    . Cough    from bronchiectasis neb txs  . Dysrhythmia    A Fib  . Hepatitis    h/o INH(isoniazide)  . History of Mycobacterium avium complex infection    lung disease  . Hypothyroidism   . Pleurisy 12/08/11  . Shortness of breath dyspnea     Past Surgical History:  Procedure Laterality Date  . CATARACT EXTRACTION W/PHACO Left 01/26/2016   Procedure: CATARACT EXTRACTION PHACO AND INTRAOCULAR LENS PLACEMENT (IOC);  Surgeon: Ronnell Freshwater, MD;  Location: [place];  Service: Ophthalmology;  Laterality: Left;  . COLONOSCOPY    . SHOULDER ARTHROSCOPY     right       History of present illness and  Hospital Course:     Kindly see H&P for history of present illness and admission details, please review complete Labs, Consult reports and Test reports for all details in brief  HPI  from the history and physical done on the day of admission 82 year old female patient with history of proximal fibrillation, bronchiectasis comes in because of generalized weakness, elevated heart  rate up to 1 10 bpm. Patient admitted for symptomatic tachycardia. Has history of proximal atrial fibrillation.   Hospital Course  #1. Generalized weakness secondary to ventricular fibrillation: Admitted to telemetry, patient isn't on flecainide, digoxin, started on IV heparin as per cardiology recommendations. Patient maintained in sinus rhythm on telemetry. Troponin's are negative. Seen by cardiology this morning. Cardiologist felt that patient had been discharged home and follow-up with primary cardiologist, Dr. Esmond Plants.As an outpatient regarding further management of  Paroxysmal atrial fibrillation. We advised the patient to continue pradaxa  and 150 mg by mouth twice a day in addition to flecainide. Patient  Was not taking pradaxa on a regular basis at home. Maintained  sinus rhythm with heart rate 80 bpm  2. Hypothyroidism. #3. Bronchiectasis.    Discharge Condition: stable  Follow UP   up with primary cardiologist in 1 month or sooner if patient feels worse.   Discharge Instructions  and  Discharge Medications        Medication List    TAKE these medications   arformoterol 15 MCG/2ML Nebu Commonly known as:  BROVANA Take 2 mLs (15 mcg total) by nebulization 2 (two) times daily.   budesonide 0.5 MG/2ML nebulizer solution Commonly known as:  PULMICORT Take 2 mLs (0.5 mg total) by nebulization 2 (two) times daily.   dabigatran 150 MG Caps capsule Commonly known as:  PRADAXA Take 1 capsule (150 mg total) by mouth 2 (two) times daily. Reported on 05/27/2016 What changed:  when to take this  reasons to take this   digoxin 0.25 MG tablet Commonly known as:  LANOXIN Take  1 tablet (0.25 mg total) by mouth daily as needed.   flecainide 50 MG tablet Commonly known as:  TAMBOCOR TAKE 1 TABLET BY MOUTH TWICE A DAY   FLUTTER Devi Use as directed   levothyroxine 75 MCG tablet Commonly known as:  SYNTHROID, LEVOTHROID Take 1 tablet (75 mcg total) by mouth daily.    mometasone 50 MCG/ACT nasal spray Commonly known as:  NASONEX Place 2 sprays into the nose daily as needed.   OXYGEN Inhale 2 L into the lungs. [place] - when exercising, walking around, and at night         Diet and Activity recommendation: See Discharge Instructions above   Consults obtained -cardio   Major procedures and Radiology Reports - PLEASE review detailed and final reports for all details, in brief -     Dg Chest 2 View  Result Date: 06/14/2016 CLINICAL DATA:  Elevated heart rate and weakness. EXAM: CHEST  2 VIEW COMPARISON:  12/05/2015 FINDINGS: Normal heart size. No pleural effusion or edema. Similar appearance of upper lobe predominant interstitial reticulation and fibrosis with upward retraction of the hila. No superimposed airspace opacities are identified. The bones appear osteopenic and there is multiple chronic appearing compression deformities within the mid thoracic spine. IMPRESSION: 1. No acute cardiopulmonary abnormalities. 2. Advanced changes of chronic lung disease. No superimposed pneumonia. Electronically Signed   By: Kerby Moors M.D.   On: 06/14/2016 12:53    Micro Results     Recent Results (from the past 240 hour(s))  MRSA PCR Screening     Status: None   Collection Time: 06/14/16  8:26 PM  Result Value Ref Range Status   MRSA by PCR NEGATIVE NEGATIVE Final    Comment:        The GeneXpert MRSA Assay (FDA approved for NASAL specimens only), is one component of a comprehensive MRSA colonization surveillance program. It is not intended to diagnose MRSA infection nor to guide or monitor treatment for MRSA infections.        Today   Subjective:   Brantleigh Sedlar today has no headache,no chest abdominal pain,no new weakness tingling or numbness, feels much better wants to go home today.   Objective:   Blood pressure (!) 108/56, pulse 76, temperature 97.4 F (36.3 C), temperature source Oral, resp. rate 18, height 5\' 8"  (1.727 m),  weight 49.9 kg (110 lb), SpO2 100 %.   Intake/Output Summary (Last 24 hours) at 06/15/16 1159 Last data filed at 06/15/16 0940  Gross per 24 hour  Intake              120 ml  Output              200 ml  Net              -80 ml    Exam Awake Alert, Oriented x 3, No new F.N deficits, Normal affect [place].AT,PERRAL Supple Neck,No JVD, No cervical lymphadenopathy appriciated.  Symmetrical Chest wall movement, Good air movement bilaterally, CTAB RRR,No Gallops,Rubs or new Murmurs, No Parasternal Heave +ve B.Sounds, Abd Soft, Non tender, No organomegaly appriciated, No rebound -guarding or rigidity. No Cyanosis, Clubbing or edema, No new Rash or bruise  Data Review   CBC w Diff: Lab Results  Component Value Date   WBC 11.0 06/15/2016   HGB 12.8 06/15/2016   HGB 11.5 (L) 06/22/2014   HCT 37.4 06/15/2016   HCT 36.3 06/22/2014   PLT 212 06/15/2016   PLT 195 06/22/2014  LYMPHOPCT 11.3 (L) 01/22/2016   LYMPHOPCT 11.2 06/22/2014   MONOPCT 7.5 01/22/2016   MONOPCT 12.0 06/22/2014   EOSPCT 2.9 01/22/2016   EOSPCT 3.5 06/22/2014   BASOPCT 0.5 01/22/2016   BASOPCT 0.4 06/22/2014    CMP: Lab Results  Component Value Date   NA 138 06/14/2016   NA 138 06/20/2014   K 4.2 06/14/2016   K 4.1 06/20/2014   CL 104 06/14/2016   CL 104 06/20/2014   CO2 27 06/14/2016   CO2 29 06/20/2014   BUN 21 (H) 06/14/2016   BUN 17 06/20/2014   CREATININE 0.64 06/14/2016   CREATININE 0.71 11/22/2014   PROT 7.4 06/14/2016   PROT 8.1 12/08/2011   ALBUMIN 3.5 06/14/2016   ALBUMIN 3.2 (L) 12/08/2011   BILITOT 1.0 06/14/2016   BILITOT 0.4 12/08/2011   ALKPHOS 74 06/14/2016   ALKPHOS 78 12/08/2011   AST 24 06/14/2016   AST 31 12/08/2011   ALT 16 06/14/2016   ALT 16 12/08/2011  .   Total Time in preparing paper work, data evaluation and todays exam - 30 minutes  Cayleb Jarnigan M.D on 06/15/2016 at 11:59 AM    Note: This dictation was prepared with Dragon dictation along with smaller phrase  technology. Any transcriptional errors that result from this process are unintentional.

## 2016-06-16 ENCOUNTER — Telehealth: Payer: Self-pay

## 2016-06-16 DIAGNOSIS — M6281 Muscle weakness (generalized): Secondary | ICD-10-CM | POA: Diagnosis not present

## 2016-06-16 DIAGNOSIS — R262 Difficulty in walking, not elsewhere classified: Secondary | ICD-10-CM | POA: Diagnosis not present

## 2016-06-16 NOTE — Telephone Encounter (Signed)
Called to follow up with transitional care management and schedule hospital follow up appointment with PCP.  No answer.  Left message to call the office back.  Will follow.

## 2016-06-18 DIAGNOSIS — M6281 Muscle weakness (generalized): Secondary | ICD-10-CM | POA: Diagnosis not present

## 2016-06-18 DIAGNOSIS — R262 Difficulty in walking, not elsewhere classified: Secondary | ICD-10-CM | POA: Diagnosis not present

## 2016-06-18 NOTE — Telephone Encounter (Signed)
Patient returned call and stated she would not like to make an appointment at this time.  Hospital follow up appointments scheduled with Cardiology and Pulmonary. Encouraged to follow up with PCP as needed.

## 2016-06-18 NOTE — Telephone Encounter (Signed)
Pt coming 07/07/16 to see Dr Rockey Situ

## 2016-06-18 NOTE — Telephone Encounter (Signed)
Can we see if she would like a follow up office visit with me to cover recent hospital coarse

## 2016-06-21 DIAGNOSIS — M6281 Muscle weakness (generalized): Secondary | ICD-10-CM | POA: Diagnosis not present

## 2016-06-21 DIAGNOSIS — R262 Difficulty in walking, not elsewhere classified: Secondary | ICD-10-CM | POA: Diagnosis not present

## 2016-06-23 DIAGNOSIS — R262 Difficulty in walking, not elsewhere classified: Secondary | ICD-10-CM | POA: Diagnosis not present

## 2016-06-23 DIAGNOSIS — M6281 Muscle weakness (generalized): Secondary | ICD-10-CM | POA: Diagnosis not present

## 2016-06-25 DIAGNOSIS — R262 Difficulty in walking, not elsewhere classified: Secondary | ICD-10-CM | POA: Diagnosis not present

## 2016-06-25 DIAGNOSIS — M6281 Muscle weakness (generalized): Secondary | ICD-10-CM | POA: Diagnosis not present

## 2016-06-28 DIAGNOSIS — R262 Difficulty in walking, not elsewhere classified: Secondary | ICD-10-CM | POA: Diagnosis not present

## 2016-06-28 DIAGNOSIS — M6281 Muscle weakness (generalized): Secondary | ICD-10-CM | POA: Diagnosis not present

## 2016-06-30 DIAGNOSIS — M6281 Muscle weakness (generalized): Secondary | ICD-10-CM | POA: Diagnosis not present

## 2016-06-30 DIAGNOSIS — R262 Difficulty in walking, not elsewhere classified: Secondary | ICD-10-CM | POA: Diagnosis not present

## 2016-07-01 DIAGNOSIS — M6281 Muscle weakness (generalized): Secondary | ICD-10-CM | POA: Diagnosis not present

## 2016-07-01 DIAGNOSIS — R262 Difficulty in walking, not elsewhere classified: Secondary | ICD-10-CM | POA: Diagnosis not present

## 2016-07-07 ENCOUNTER — Ambulatory Visit (INDEPENDENT_AMBULATORY_CARE_PROVIDER_SITE_OTHER): Payer: Medicare Other | Admitting: Cardiovascular Disease

## 2016-07-07 ENCOUNTER — Encounter: Payer: Self-pay | Admitting: Cardiovascular Disease

## 2016-07-07 VITALS — BP 130/70 | HR 78 | Ht 68.0 in | Wt 116.2 lb

## 2016-07-07 DIAGNOSIS — I4891 Unspecified atrial fibrillation: Secondary | ICD-10-CM | POA: Diagnosis not present

## 2016-07-07 DIAGNOSIS — J471 Bronchiectasis with (acute) exacerbation: Secondary | ICD-10-CM | POA: Diagnosis not present

## 2016-07-07 DIAGNOSIS — R262 Difficulty in walking, not elsewhere classified: Secondary | ICD-10-CM | POA: Diagnosis not present

## 2016-07-07 DIAGNOSIS — M6281 Muscle weakness (generalized): Secondary | ICD-10-CM | POA: Diagnosis not present

## 2016-07-07 DIAGNOSIS — R630 Anorexia: Secondary | ICD-10-CM | POA: Diagnosis not present

## 2016-07-07 MED ORDER — DIGOXIN 250 MCG PO TABS: 0.2500 mg | ORAL_TABLET | Freq: Every day | ORAL | 3 refills | Status: DC | PRN

## 2016-07-07 NOTE — Progress Notes (Signed)
Cardiology Office Note  Date:  07/07/2016   ID:  Hannah Kline, DOB Jul 16, 1932, MRN [id number]  PCP:  Hannah Mc, MD   Chief Complaint  Patient presents with  . Other    Follow up from [facility]. Meds reviewed by the patient verbally. "doing well."     HPI:  Hannah Kline is a very pleasant 82 year-old woman with a remote history of Mycobacterium avium with residual lung disease/Bronchiectasis diagnosed 17 yr ago, on inhalers, with a history of atrial fibrillation, who presents for followup of her  atrial fibrillation. history of C. Difficile in genera 2015 after treatment with Cipro for bronchiectasis Symptoms resolved with Flagyl 2  Finished PT at [facility] Recent hospitalization one month ago for tachycardia She was concerned she was in atrial fibrillation EKG showed normal sinus rhythm, no medication changes made Reports baseline heart rate typically in the 60 range Felt that was a problem when heart rate was 90-100 Breathing has been relatively stable, continues to use oxygen on exertion. Did not bring it with her today No significant leg edema or chest pain Reports her weight is stable Takes flecainide 50 twice a day Only takes anticoagulation when she has tachyarrhythmia concerning for atrial fibrillation. Reports she is very symptomatic  EKG on today's visit shows normal sinus rhythm with rate 79 bpm, no significant ST or T-wave changes, left axis deviation  Other past medical history Feels her respiratory status has been slowly getting worse Now on oxygen when she walks, does not feel at she needs this when she is sitting, resting She has a home generator and a portable oxygen generator Sees pulmonary, Dr. Waunita Kline.  Previous Chest x-ray done which shows stable COPD, pulmonary fibrosis, bronchiectasis.  In the past she took digoxin, extra flecainide for paroxysmal atrial fibrillation which worked well for her to restore normal sinus rhythm    she has failed sotalol.  Amiodarone was discontinued given underlying lung disease. She was started on  Flecainide 50 mg twice a day with good success.  Episode of atrial fibrillation on April 27th to the 29th 2014. She came to the office, had an EKG. She took digoxin x2,  extra flecainide. This was the longest episode of atrial fibrillation she has had. She is symptomatic in atrial fibrillation. She was also given pradaxa and took his twice a day.   Other  Episodes of tachycardia in July 2013, October 2013.    PMH:   has a past medical history of Atrial fibrillation (Hannah Kline); Bacterial infection due to Pseudomonas; Bronchiectasis; COPD (chronic obstructive pulmonary disease) (HCC) ( ); Cough; Dysrhythmia; Hepatitis; History of Mycobacterium avium complex infection; Hypothyroidism; Pleurisy (12/08/11); and Shortness of breath dyspnea.  PSH:    Past Surgical History:  Procedure Laterality Date  . CATARACT EXTRACTION W/PHACO Left 01/26/2016   Procedure: CATARACT EXTRACTION PHACO AND INTRAOCULAR LENS PLACEMENT (IOC);  Surgeon: Hannah Freshwater, MD;  Location: [place];  Service: Ophthalmology;  Laterality: Left;  . COLONOSCOPY    . SHOULDER ARTHROSCOPY     right    Current Outpatient Prescriptions  Medication Sig Dispense Refill  . arformoterol (BROVANA) 15 MCG/2ML NEBU Take 2 mLs (15 mcg total) by nebulization 2 (two) times daily. 120 mL 11  . budesonide (PULMICORT) 0.5 MG/2ML nebulizer solution Take 2 mLs (0.5 mg total) by nebulization 2 (two) times daily. 120 mL 11  . dabigatran (PRADAXA) 150 MG CAPS capsule Take 1 capsule (150 mg total) by mouth 2 (two) times daily. Reported on  05/27/2016 60 capsule 0  . digoxin (LANOXIN) 0.25 MG tablet Take 1 tablet (0.25 mg total) by mouth daily as needed. 30 tablet 3  . flecainide (TAMBOCOR) 50 MG tablet TAKE 1 TABLET BY MOUTH TWICE A DAY 60 tablet 6  . levothyroxine (SYNTHROID, LEVOTHROID) 75 MCG tablet Take 1 tablet (75 mcg total) by mouth daily. 90 tablet 3   . mometasone (NASONEX) 50 MCG/ACT nasal spray Place 2 sprays into the nose daily as needed. 17 g 5  . OXYGEN Inhale 2 L into the lungs. [place] - when exercising, walking around, and at night    . Respiratory Therapy Supplies (FLUTTER) DEVI Use as directed 1 each 0   No current facility-administered medications for this visit.      Allergies:   Ciprofloxacin; Isoniazid; and Moxifloxacin   Social History:  The patient  reports that she has never smoked. She has never used smokeless tobacco. She reports that she drinks about 4.2 - 4.8 oz of alcohol per week . She reports that she does not use drugs.   Family History:   family history includes Angina in her mother; Diabetes in her maternal grandfather; Other in her mother.    Review of Systems: Review of Systems  Constitutional: Negative.   Respiratory: Positive for cough and shortness of breath.   Cardiovascular: Positive for palpitations.  Gastrointestinal: Negative.   Musculoskeletal: Negative.   Neurological: Negative.   Psychiatric/Behavioral: Negative.   All other systems reviewed and are negative.    PHYSICAL EXAM: VS:  BP 130/70 (BP Location: Left Arm, Patient Position: Sitting, Cuff Size: Normal)   Pulse 78   Ht 5\' 8"  (1.727 m)   Wt 116 lb 4 oz (52.7 kg)   BMI 17.68 kg/m  , BMI Body mass index is 17.68 kg/m. GEN: Thin, in no acute distress  HEENT: normal  Neck: no JVD, carotid bruits, or masses Cardiac: RRR; no murmurs, rubs, or gallops,no edema  Respiratory:  clear to auscultation bilaterally, normal work of breathing GI: soft, nontender, nondistended, + BS MS: no deformity or atrophy  Skin: warm and dry, no rash Neuro:  Strength and sensation are intact Psych: euthymic mood, full affect    Recent Labs: 01/22/2016: TSH 3.47 06/14/2016: ALT 16; BUN 21; Creatinine, Ser 0.64; Magnesium 2.0; Potassium 4.2; Sodium 138 06/15/2016: Hemoglobin 12.8; Platelets 212    Lipid Panel Lab Results  Component Value Date   CHOL  177 01/22/2016   HDL 53.50 01/22/2016   LDLCALC 108 (H) 01/22/2016   TRIG 78.0 01/22/2016      Wt Readings from Last 3 Encounters:  07/07/16 116 lb 4 oz (52.7 kg)  06/14/16 110 lb (49.9 kg)  05/27/16 113 lb 12.8 oz (51.6 kg)       ASSESSMENT AND PLAN:  Atrial fibrillation, unspecified type ([place]) - Plan: EKG 12-Lead Recent hospital records reviewed in detail with her Maintaining normal sinus rhythm recent hospital admission, no atrial fibrillation noted We'll continue flecainide 50 twice a day If she does start to have increasing frequency of arrhythmia, could increase flecainide up to 75 mg twice a day even 100 mg twice a day  Bronchiectasis with acute exacerbation (HCC) Chronic bronchiectasis, managed by pulmonary  Slow progression over the past several years  Managed by pulmonary   Anorexia Encouraged her to increase her calorie intake Blood pressure stable   Total encounter time more than 25 minutes  Greater than 50% was spent in counseling and coordination of care with the patient  Disposition:   F/U  6 months   Orders Placed This Encounter  Procedures  . EKG 12-Lead     Signed, Esmond Plants, M.D., Ph.D. 07/07/2016  [place], [place]

## 2016-07-07 NOTE — Patient Instructions (Signed)

## 2016-07-16 ENCOUNTER — Ambulatory Visit: Payer: Self-pay | Admitting: Cardiovascular Disease

## 2016-07-21 DIAGNOSIS — H401493 Capsular glaucoma with pseudoexfoliation of lens, unspecified eye, severe stage: Secondary | ICD-10-CM | POA: Diagnosis not present

## 2016-08-20 DIAGNOSIS — R21 Rash and other nonspecific skin eruption: Secondary | ICD-10-CM | POA: Diagnosis not present

## 2016-09-02 DIAGNOSIS — Z23 Encounter for immunization: Secondary | ICD-10-CM | POA: Diagnosis not present

## 2016-09-03 ENCOUNTER — Encounter: Payer: Self-pay | Admitting: Pulmonary Disease

## 2016-09-03 ENCOUNTER — Ambulatory Visit (INDEPENDENT_AMBULATORY_CARE_PROVIDER_SITE_OTHER): Payer: Medicare Other | Admitting: Pulmonary Disease

## 2016-09-03 DIAGNOSIS — Z9981 Dependence on supplemental oxygen: Secondary | ICD-10-CM | POA: Diagnosis not present

## 2016-09-03 DIAGNOSIS — J479 Bronchiectasis, uncomplicated: Secondary | ICD-10-CM

## 2016-09-03 DIAGNOSIS — R0902 Hypoxemia: Secondary | ICD-10-CM | POA: Diagnosis not present

## 2016-09-03 NOTE — Patient Instructions (Signed)
  If you have increasing chest congestion, wheezing, or shortness of breath use your therapy vest in the evenings Keep using your Brovana and Pulmicort as you're doing Keep using oxygen when you exert herself and when you sleep I will see you back in 4 months or sooner if needed

## 2016-09-03 NOTE — Assessment & Plan Note (Signed)
This has been a stable interval for Hannah Kline. She has been exercising more recently and her dyspnea has improved. Her lungs sound quite clear today on exam.  As a matter of course, I reminded her to make some sort of effort at mucociliary clearance twice a day. Exercise in the mornings count but she's not using her vest or flutter valve that much in the evenings.  Her flu shot is up-to-date  She will continue taking Brovana and Pulmicort  She will follow-up with me in 4 months.

## 2016-09-03 NOTE — Assessment & Plan Note (Signed)
  Continue 2 L of oxygen on exertion as well as at night

## 2016-09-03 NOTE — Progress Notes (Signed)
Subjective:    Patient ID: Dotti Voisard, female    DOB: 08/18/1932, 82 y.o.   MRN: [id number] Synopsis: [name] established care with the [facility] pulmonary office in February 2013 for bronchiectasis due to [place]. She was diagnosed at age 47 in [place]. She was treated with rifampin ethambutol and clarithromycin for 2 years. Since then she does not believe she was ever grown out MAI.  She had c.diff colitis in 2015 which was refractory to treatment.    HPI  Chief Complaint  Patient presents with  . Follow-up    c/o baseline SOB with exertion, prod cough with pale yellow mucus.    Kemoni says she is doing much better.  She says the phsyical therapy really helped her strength and now she is exercising again. She followed many different pages of instructions of exercises.  Her eating has improved some and she has gained a little weight.  She is using her oxygen more frequently with exertion and when walking.  She had a flu shot yesterday.  Her mucus production is constant, throughout the day, bringing up yellow mucus.  She uses exercise in the mornings.  Past Medical History:  Diagnosis Date  . Atrial fibrillation ([place])   . Bacterial infection due to Pseudomonas   . Bronchiectasis   . COPD (chronic obstructive pulmonary disease) ([place])    . Cough    from bronchiectasis neb txs  . Dysrhythmia    A Fib  . Hepatitis    h/o INH(isoniazide)  . History of Mycobacterium avium complex infection    lung disease  . Hypothyroidism   . Pleurisy 12/08/11  . Shortness of breath dyspnea      Review of Systems  Constitutional: Negative for appetite change, chills, fatigue, fever and unexpected weight change.  HENT: Negative for congestion, ear pain and nosebleeds.   Respiratory: Negative for cough, choking and shortness of breath.   Cardiovascular: Negative for chest pain and leg swelling.       Objective:   Physical Exam Vitals:   09/03/16 1351   BP: 124/68  BP Location: Right Arm  Cuff Size: Normal  Pulse: 80  SpO2: 98%  Weight: 117 lb 9.6 oz (53.3 kg)  Height: 5\' 8"  (1.727 m)   2L   Gen: well appearing, no acute distress HEENT: NCAT, EOMi, OP clear,  PULM: Clear to auscultation bilaterally, normal effort CV: Irreg irreg, no mgr, no JVD AB: BS+, soft, nontender, no hsm Ext: warm, no edema, no clubbing, no cyanosis  Review of 2012 sputum microbiology: Pan sensitive pseudomonas  09/2010 Full PFT: Ratio 63%, FEV1 1.49L (67% pred)  10/2011 CT Chest: impressive upper lobe cystic bronchiectasis, scattered lower lobe tree-in-bud abnormalities and scattered nodules; One RML solid nodule measures 7.78mm  January 2013 CT chest [facility]: Stable upper lobe cystic bronchiectasis also present in the right middle lobe. Scattered tree in bud abnormalities and nodules throughout all lung fields roughly unchanged from prior. Right middle lobe nodule is no longer visible but there is an 58mm nodule in the right upper lobe.  December 2013 simple spirometry FEV1 0.8 L  10/11/2012 Sputum: PSEUDOMONAS AERUGINOSA       Antibiotic  Sensitivity  Microscan  Status    CEFEPIME  Sensitive  2  Final    CEFTAZIDIME  Sensitive  <=1  Final    CIPROFLOXACIN  Intermediate  2  Final    GENTAMICIN  Sensitive  <=1  Final    IMIPENEM  Sensitive  1  Final    LEVOFLOXACIN  Intermediate  4  Final    PIP/TAZO  Sensitive  <=4  Final    TOBRAMYCIN  Sensitive  <=1  Final    January 2014 simple spirometry >> not reproducible by ATS standards  January 2014 Sputum AFB > neg x3 11/2013 FEV1 0.93 L  2015 Sputum> Pseudomonas , Cipro/Levofloxacin Intermediate suscept; Cefepime,Ceftaz, Imi, Pip-Tazo, Gent all susceptible  September 2015 sputum culture> Klebsiella resistant to amoxicillin but susceptible to Augmentin and multiple other agents  January - February 2016 Sputum AFB negative x 3  12/2015 AFB sputum > MAI 03/2016 AFB sputum > negative     Assessment &  Plan:   BRONCHIECTASIS This has been a stable interval for Ms. Gruetzmacher. She has been exercising more recently and her dyspnea has improved. Her lungs sound quite clear today on exam.  As a matter of course, I reminded her to make some sort of effort at mucociliary clearance twice a day. Exercise in the mornings count but she's not using her vest or flutter valve that much in the evenings.  Her flu shot is up-to-date  She will continue taking Brovana and Pulmicort  She will follow-up with me in 4 months.  Hypoxemia requiring supplemental oxygen  Continue 2 L of oxygen on exertion as well as at night   Updated Medication List Outpatient Encounter Prescriptions as of 09/03/2016  Medication Sig Dispense Refill  . arformoterol (BROVANA) 15 MCG/2ML NEBU Take 2 mLs (15 mcg total) by nebulization 2 (two) times daily. 120 mL 11  . budesonide (PULMICORT) 0.5 MG/2ML nebulizer solution Take 2 mLs (0.5 mg total) by nebulization 2 (two) times daily. 120 mL 11  . dabigatran (PRADAXA) 150 MG CAPS capsule Take 1 capsule (150 mg total) by mouth 2 (two) times daily. Reported on 05/27/2016 60 capsule 0  . digoxin (LANOXIN) 0.25 MG tablet Take 1 tablet (0.25 mg total) by mouth daily as needed. 30 tablet 3  . flecainide (TAMBOCOR) 50 MG tablet TAKE 1 TABLET BY MOUTH TWICE A DAY 60 tablet 6  . levothyroxine (SYNTHROID, LEVOTHROID) 75 MCG tablet Take 1 tablet (75 mcg total) by mouth daily. 90 tablet 3  . OXYGEN Inhale 2 L into the lungs. [place] - when exercising, walking around, and at night    . Respiratory Therapy Supplies (FLUTTER) DEVI Use as directed 1 each 0  . mometasone (NASONEX) 50 MCG/ACT nasal spray Place 2 sprays into the nose daily as needed. 17 g 5   No facility-administered encounter medications on file as of 09/03/2016.

## 2016-09-17 ENCOUNTER — Other Ambulatory Visit: Payer: Self-pay | Admitting: *Deleted

## 2016-09-17 MED ORDER — FLECAINIDE ACETATE 50 MG PO TABS: 50.0000 mg | ORAL_TABLET | Freq: Two times a day (BID) | ORAL | 3 refills | Status: DC

## 2016-09-20 ENCOUNTER — Other Ambulatory Visit: Payer: Self-pay

## 2016-09-20 MED ORDER — FLECAINIDE ACETATE 50 MG PO TABS: 50.0000 mg | ORAL_TABLET | Freq: Two times a day (BID) | ORAL | 3 refills | Status: DC

## 2016-10-12 DIAGNOSIS — T7840XA Allergy, unspecified, initial encounter: Secondary | ICD-10-CM | POA: Diagnosis not present

## 2016-10-12 DIAGNOSIS — L280 Lichen simplex chronicus: Secondary | ICD-10-CM | POA: Diagnosis not present

## 2016-10-12 DIAGNOSIS — D485 Neoplasm of uncertain behavior of skin: Secondary | ICD-10-CM | POA: Diagnosis not present

## 2016-10-12 DIAGNOSIS — L570 Actinic keratosis: Secondary | ICD-10-CM | POA: Diagnosis not present

## 2016-10-12 DIAGNOSIS — L820 Inflamed seborrheic keratosis: Secondary | ICD-10-CM | POA: Diagnosis not present

## 2016-10-12 DIAGNOSIS — L821 Other seborrheic keratosis: Secondary | ICD-10-CM | POA: Diagnosis not present

## 2016-10-13 DIAGNOSIS — H401492 Capsular glaucoma with pseudoexfoliation of lens, unspecified eye, moderate stage: Secondary | ICD-10-CM | POA: Diagnosis not present

## 2016-10-19 DIAGNOSIS — L299 Pruritus, unspecified: Secondary | ICD-10-CM | POA: Diagnosis not present

## 2016-10-19 DIAGNOSIS — H401492 Capsular glaucoma with pseudoexfoliation of lens, unspecified eye, moderate stage: Secondary | ICD-10-CM | POA: Diagnosis not present

## 2016-10-19 DIAGNOSIS — R21 Rash and other nonspecific skin eruption: Secondary | ICD-10-CM | POA: Diagnosis not present

## 2016-10-26 DIAGNOSIS — H401492 Capsular glaucoma with pseudoexfoliation of lens, unspecified eye, moderate stage: Secondary | ICD-10-CM | POA: Diagnosis not present

## 2016-11-19 DIAGNOSIS — L280 Lichen simplex chronicus: Secondary | ICD-10-CM | POA: Diagnosis not present

## 2016-11-19 DIAGNOSIS — L820 Inflamed seborrheic keratosis: Secondary | ICD-10-CM | POA: Diagnosis not present

## 2016-11-19 DIAGNOSIS — R21 Rash and other nonspecific skin eruption: Secondary | ICD-10-CM | POA: Diagnosis not present

## 2016-11-19 DIAGNOSIS — L299 Pruritus, unspecified: Secondary | ICD-10-CM | POA: Diagnosis not present

## 2016-12-02 DIAGNOSIS — L578 Other skin changes due to chronic exposure to nonionizing radiation: Secondary | ICD-10-CM | POA: Diagnosis not present

## 2016-12-02 DIAGNOSIS — L820 Inflamed seborrheic keratosis: Secondary | ICD-10-CM | POA: Diagnosis not present

## 2016-12-02 DIAGNOSIS — L821 Other seborrheic keratosis: Secondary | ICD-10-CM | POA: Diagnosis not present

## 2017-01-03 ENCOUNTER — Ambulatory Visit (INDEPENDENT_AMBULATORY_CARE_PROVIDER_SITE_OTHER): Payer: Medicare Other | Admitting: Cardiovascular Disease

## 2017-01-03 ENCOUNTER — Encounter: Payer: Self-pay | Admitting: Cardiovascular Disease

## 2017-01-03 VITALS — BP 110/60 | HR 67 | Ht 68.0 in | Wt 115.0 lb

## 2017-01-03 DIAGNOSIS — I951 Orthostatic hypotension: Secondary | ICD-10-CM

## 2017-01-03 DIAGNOSIS — I4891 Unspecified atrial fibrillation: Secondary | ICD-10-CM | POA: Diagnosis not present

## 2017-01-03 DIAGNOSIS — R531 Weakness: Secondary | ICD-10-CM | POA: Diagnosis not present

## 2017-01-03 MED ORDER — DILTIAZEM HCL 30 MG PO TABS: 30.0000 mg | ORAL_TABLET | Freq: Three times a day (TID) | ORAL | 3 refills | Status: DC | PRN

## 2017-01-03 MED ORDER — APIXABAN 2.5 MG PO TABS: 2.5000 mg | ORAL_TABLET | Freq: Two times a day (BID) | ORAL | 3 refills | Status: DC

## 2017-01-03 MED ORDER — FLECAINIDE ACETATE 50 MG PO TABS: 75.0000 mg | ORAL_TABLET | Freq: Two times a day (BID) | ORAL | 3 refills | Status: DC

## 2017-01-03 NOTE — Patient Instructions (Addendum)
Medication Instructions:   If you go into atrial fibrillation, Take an extra flecainide and take diltiazem If you continue to run in atrial fibrillation several hours later, Again take extra flecainide and diltiazem   Stop the pradaxa, Start eliquis twice a day on a regular basis  Please  increase the flecainide up to 1 1/2 pills twice a day   Labwork:  No new labs needed  Testing/Procedures:  No further testing at this time   I recommend watching educational videos on topics of interest to you at:       [email or web address].com  Enter code: [email or web address]    Follow-Up: It was a pleasure seeing you in the office today. Please call us if you have new issues that need to be addressed before your next appt.  [phone]  Your physician wants you to follow-up in: 6 months.  You will receive a reminder letter in the mail two months in advance. If you don't receive a letter, please call our office to schedule the follow-up appointment.  If you need a refill on your cardiac medications before your next appointment, please call your pharmacy.

## 2017-01-03 NOTE — Progress Notes (Signed)
Cardiology Office Note  Date:  01/03/2017   ID:  Hannah Kline, DOB 1932-09-27, MRN [id number]  PCP:  Crecencio Mc, MD   Chief Complaint  Patient presents with  . other    6 month follow up. Pt. c/o being in A-fib over the weekend. Meds reviewed by the pt. verbally.     HPI:  Hannah Kline is a very pleasant 83 year-old woman with a remote history of Mycobacterium avium with residual lung disease/Bronchiectasis diagnosed 17 yr ago, on inhalers, with a history of atrial fibrillation, who presents for followup of her atrial fibrillation. history of C. Difficile in genera 2015 after treatment with Cipro for bronchiectasis Symptoms resolved with Flagyl 2  In follow-up today she reports that she is having increasing frequency of atrial fibrillation Reports having all weekend Feels weak, tired, staggers Husband who presents with her today reports an increase in frequency She does not check her heart rate when this happens, just knows that something is wrong Denies any acute exacerbation of her bronchiectasis  Breathing has been relatively stable, continues to use oxygen on exertion. No significant leg edema or chest pain Reports her weight is stable Takes flecainide 50 twice a day Only takes anticoagulation when she has tachyarrhythmia concerning for atrial fibrillation.   CHADS VASC  reviewed with her, score of 3  EKG on today's visit shows normal sinus rhythm with rate 67 bpm, no significant ST or T-wave changes, left axis deviation  Other past medical history Feels her respiratory status has been slowly getting worse Now on oxygen when she walks, does not feel at she needs this when she is sitting, resting She has a home generator and a portable oxygen generator Sees pulmonary, Dr. Waunita Schooner.  Previous Chest x-ray done which shows stable COPD, pulmonary fibrosis, bronchiectasis.  In the past she took digoxin, extra flecainide for paroxysmal atrial fibrillation which worked well  for her to restore normal sinus rhythm    she has failed sotalol. Amiodarone was discontinued given underlying lung disease. She was started on Flecainide 50 mg twice a day with good success.  Episode of atrial fibrillation on April 27th to the 29th 2014. She came to the office, had an EKG. She took digoxin x2, extra flecainide. This was the longest episode of atrial fibrillation she has had. She is symptomatic in atrial fibrillation. She was also given pradaxa and took his twice a day.   Other Episodes of tachycardia in July 2013, October 2013.    PMH:   has a past medical history of Atrial fibrillation ([place]); Bacterial infection due to Pseudomonas; Bronchiectasis; COPD (chronic obstructive pulmonary disease) (HCC) ( ); Cough; Dysrhythmia; Hepatitis; History of Mycobacterium avium complex infection; Hypothyroidism; Pleurisy (12/08/11); and Shortness of breath dyspnea.  PSH:    Past Surgical History:  Procedure Laterality Date  . CATARACT EXTRACTION W/PHACO Left 01/26/2016   Procedure: CATARACT EXTRACTION PHACO AND INTRAOCULAR LENS PLACEMENT (IOC);  Surgeon: Ronnell Freshwater, MD;  Location: [place];  Service: Ophthalmology;  Laterality: Left;  . COLONOSCOPY    . SHOULDER ARTHROSCOPY     right    Current Outpatient Prescriptions  Medication Sig Dispense Refill  . arformoterol (BROVANA) 15 MCG/2ML NEBU Take 2 mLs (15 mcg total) by nebulization 2 (two) times daily. 120 mL 11  . budesonide (PULMICORT) 0.5 MG/2ML nebulizer solution Take 2 mLs (0.5 mg total) by nebulization 2 (two) times daily. 120 mL 11  . dabigatran (PRADAXA) 150 MG CAPS capsule Take 1 capsule (150  mg total) by mouth 2 (two) times daily. Reported on 05/27/2016 60 capsule 0  . digoxin (LANOXIN) 0.25 MG tablet Take 1 tablet (0.25 mg total) by mouth daily as needed. 30 tablet 3  . flecainide (TAMBOCOR) 50 MG tablet Take 1.5 tablets (75 mg total) by mouth 2 (two) times daily. 135 tablet 3  . levothyroxine  (SYNTHROID, LEVOTHROID) 75 MCG tablet Take 1 tablet (75 mcg total) by mouth daily. 90 tablet 3  . OXYGEN Inhale 2 L into the lungs. [place] - when exercising, walking around, and at night    . Respiratory Therapy Supplies (FLUTTER) DEVI Use as directed 1 each 0  . apixaban (ELIQUIS) 2.5 MG TABS tablet Take 1 tablet (2.5 mg total) by mouth 2 (two) times daily. 180 tablet 3  . diltiazem (CARDIZEM) 30 MG tablet Take 1 tablet (30 mg total) by mouth 3 (three) times daily as needed (atrial fibrillation). 270 tablet 3  . mometasone (NASONEX) 50 MCG/ACT nasal spray Place 2 sprays into the nose daily as needed. 17 g 5   No current facility-administered medications for this visit.      Allergies:   Ciprofloxacin; Isoniazid; and Moxifloxacin   Social History:  The patient  reports that she has never smoked. She has never used smokeless tobacco. She reports that she drinks about 4.2 - 4.8 oz of alcohol per week . She reports that she does not use drugs.   Family History:   family history includes Angina in her mother; Diabetes in her maternal grandfather; Other in her mother.    Review of Systems: Review of Systems  Constitutional: Positive for malaise/fatigue.  Respiratory: Negative.   Cardiovascular: Positive for palpitations.  Gastrointestinal: Negative.   Musculoskeletal: Negative.   Neurological: Positive for weakness.  Psychiatric/Behavioral: Negative.   All other systems reviewed and are negative.    PHYSICAL EXAM: VS:  BP 110/60 (BP Location: Left Arm, Patient Position: Sitting, Cuff Size: Normal)   Pulse 67   Ht _0  (1.727 m)   Wt 115 lb (52.2 kg)   BMI 17.49 kg/m  , BMI Body mass index is 17.49 kg/m. GEN: thin, in no acute distress  HEENT: normal  Neck: no JVD, carotid bruits, or masses Cardiac: RRR; no murmurs, rubs, or gallops,no edema  Respiratory: normal work of breathing, crackles anteriorly posteriorly worse at the bases GI: soft, nontender, nondistended, + BS MS: no  deformity or atrophy  Skin: warm and dry, no rash Neuro:  Strength and sensation are intact Psych: euthymic mood, full affect    Recent Labs: 01/22/2016: TSH 3.47 06/14/2016: ALT 16; BUN 21; Creatinine, Ser 0.64; Magnesium 2.0; Potassium 4.2; Sodium 138 06/15/2016: Hemoglobin 12.8; Platelets 212    Lipid Panel Lab Results  Component Value Date   CHOL 177 01/22/2016   HDL 53.50 01/22/2016   LDLCALC 108 (H) 01/22/2016   TRIG 78.0 01/22/2016      Wt Readings from Last 3 Encounters:  01/03/17 115 lb (52.2 kg)  09/03/16 117 lb 9.6 oz (53.3 kg)  07/07/16 116 lb 4 oz (52.7 kg)       ASSESSMENT AND PLAN:  Atrial fibrillation, unspecified type ([place]) - Plan: EKG 12-Lead Paroxysmal atrial fibrillation increasing frequency of episodes Long discussion with her today Given her CHADS vasc of 3,  recommended she start anticoagulation on a regular basis not as needed Discussed various types of anticoagulation with her, she will start weight and age adjusted eliquis 2.5 milligrams twice a day Discussed various strategies for her  atrial fibrillation including taking other medications in addition to her flecainide. She has indicated she would like to simplify her list, not add additional medications. Recommended she increase the flecainide up to 75 mill grams twice a day. Suggested she take extra flecainide 50 mill grams dose with diltiazem 30 mg dose as needed for breakthrough atrial fibrillation if she continues to have symptoms recommended she call our office.   Orthostatic hypotension - Plan: EKG 12-Lead  blood pressure runs low on a regular basis likely secondary to low body weight . Unable to add diltiazem on a regular basis . We'll avoid beta blockers in the setting of underlying lung disease .  Weakness generalized - Plan: EKG 12-Lead Reports having significant symptoms when she is in atrial fibrillation .  Significant time spent in discussion with her today as detailed above  Total  encounter time more than 45 minutes  Greater than 50% was spent in counseling and coordination of care with the patient   Disposition:   F/U  6 months   Orders Placed This Encounter  Procedures  . EKG 12-Lead     Signed, Esmond Plants, M.D., Ph.D. 01/03/2017  [place], [place]

## 2017-01-05 ENCOUNTER — Ambulatory Visit (INDEPENDENT_AMBULATORY_CARE_PROVIDER_SITE_OTHER): Payer: Medicare Other | Admitting: Pulmonary Disease

## 2017-01-05 ENCOUNTER — Encounter: Payer: Self-pay | Admitting: Pulmonary Disease

## 2017-01-05 DIAGNOSIS — Z9981 Dependence on supplemental oxygen: Secondary | ICD-10-CM | POA: Diagnosis not present

## 2017-01-05 DIAGNOSIS — I480 Paroxysmal atrial fibrillation: Secondary | ICD-10-CM | POA: Diagnosis not present

## 2017-01-05 DIAGNOSIS — J471 Bronchiectasis with (acute) exacerbation: Secondary | ICD-10-CM

## 2017-01-05 DIAGNOSIS — R0902 Hypoxemia: Secondary | ICD-10-CM | POA: Diagnosis not present

## 2017-01-05 NOTE — Progress Notes (Signed)
Subjective:    Patient ID: Hannah Kline, female    DOB: 05-20-1932, 83 y.o.   MRN: [id number] Synopsis: Hannah Kline established care with the [facility] pulmonary office in February 2013 for bronchiectasis due to [place]. She was diagnosed at age 53 in [place]. She was treated with rifampin ethambutol and clarithromycin for 2 years. Since then she does not believe she was ever grown out MAI.  She had c.diff colitis in 2015 which was refractory to treatment.    HPI  Chief Complaint  Patient presents with  . Follow-up    pt states she feels "horrible" d/t being in afib- following with cards for this.  denies any breathing complaints.    In the last few days the atrial fibrillation has been back and making her feel worse.  She says that her medications were recently changed, and in the last few days she says she still has the sensation of fatigue and she feels very weak.    Her cough as been at baseline.  She says the mucus production is at baseline.  Her weight has been stable at 116 pounds.    Brovana pulmicort twice a day.  She continues to intentionally produce mucus about twice per day.     Past Medical History:  Diagnosis Date  . Atrial fibrillation ([place])   . Bacterial infection due to Pseudomonas   . Bronchiectasis   . COPD (chronic obstructive pulmonary disease) ([place])    . Cough    from bronchiectasis neb txs  . Dysrhythmia    A Fib  . Hepatitis    h/o INH(isoniazide)  . History of Mycobacterium avium complex infection    lung disease  . Hypothyroidism   . Pleurisy 12/08/11  . Shortness of breath dyspnea      Review of Systems  Constitutional: Negative for appetite change, chills, fatigue, fever and unexpected weight change.  HENT: Negative for congestion, ear pain and nosebleeds.   Respiratory: Negative for cough, choking and shortness of breath.   Cardiovascular: Negative for chest pain and leg swelling.       Objective:   Physical  Exam Vitals:   01/05/17 1459  BP: 118/64  BP Location: Right Arm  Cuff Size: Normal  Pulse: 88  SpO2: 96%  Weight: 118 lb (53.5 kg)  Height: 5' 8"  (1.727 m)   2L   Gen: well appearing HENT: OP clear, TM's clear, neck supple PULM: Crackles bases B, normal percussion CV: RRR, no mgr, trace edema GI: BS+, soft, nontender Derm: no cyanosis or rash Psyche: normal mood and affect   Micro Review of 2012 sputum microbiology: Pan sensitive pseudomonas  10/11/2012 Sputum: PSEUDOMONAS AERUGINOSA       Antibiotic  Sensitivity  Microscan  Status    CEFEPIME  Sensitive  2  Final    CEFTAZIDIME  Sensitive  <=1  Final    CIPROFLOXACIN  Intermediate  2  Final    GENTAMICIN  Sensitive  <=1  Final    IMIPENEM  Sensitive  1  Final    LEVOFLOXACIN  Intermediate  4  Final    PIP/TAZO  Sensitive  <=4  Final    TOBRAMYCIN  Sensitive  <=1  Final    January 2014 Sputum AFB > neg x3 11/2013 FEV1 0.93 L  2015 Sputum> Pseudomonas , Cipro/Levofloxacin Intermediate suscept; Cefepime,Ceftaz, Imi, Pip-Tazo, Gent all susceptible  September 2015 sputum culture> Klebsiella resistant to amoxicillin but susceptible to Augmentin and multiple other agents  January - February 2016 Sputum AFB negative x 3  12/2015 AFB sputum > MAI 03/2016 AFB sputum > negative  PFT 09/2010 Full PFT: Ratio 63%, FEV1 1.49L (67% pred) December 2013 simple spirometry FEV1 0.8 L January 2014 simple spirometry >> not reproducible by ATS standards   Imaging 10/2011 CT Chest: impressive upper lobe cystic bronchiectasis, scattered lower lobe tree-in-bud abnormalities and scattered nodules; One RML solid nodule measures 7.58m  January 2013 CT chest [facility]: Stable upper lobe cystic bronchiectasis also present in the right middle lobe. Scattered tree in bud abnormalities and nodules throughout all lung fields roughly unchanged from prior. Right middle lobe nodule is no longer visible but there is an 855mnodule in the right upper  lobe.         Assessment & Plan:   Atrial fibrillation (HCAtkinsGrSeaneems to be going in and out of A. fib. When I measured her pulse at the beginning of the clinic visit she was in A. fib, but when I performed a formal physical exam she was in sinus rhythm.  Recently cardiology adjusted her medicines. I've advised her to reach out to them if she continues to feel symptomatic from her A. fib over the next few days. Hopefully the recent adjustment to her medications will start to take effect in the next few days.  BRONCHIECTASIS Stable interval without exacerbation. Today she was counseled on the importance of maintaining a normal mucociliary clearance regimen. Continue Brovana and Pulmicort. Immunizations are up-to-date.  Hypoxemia requiring supplemental oxygen Continue 2 L of oxygen with exertion and while sleeping.   Updated Medication List Outpatient Encounter Prescriptions as of 01/05/2017  Medication Sig Dispense Refill  . apixaban (ELIQUIS) 2.5 MG TABS tablet Take 1 tablet (2.5 mg total) by mouth 2 (two) times daily. 180 tablet 3  . arformoterol (BROVANA) 15 MCG/2ML NEBU Take 2 mLs (15 mcg total) by nebulization 2 (two) times daily. 120 mL 11  . budesonide (PULMICORT) 0.5 MG/2ML nebulizer solution Take 2 mLs (0.5 mg total) by nebulization 2 (two) times daily. 120 mL 11  . diltiazem (CARDIZEM) 30 MG tablet Take 1 tablet (30 mg total) by mouth 3 (three) times daily as needed (atrial fibrillation). 270 tablet 3  . flecainide (TAMBOCOR) 50 MG tablet Take 1.5 tablets (75 mg total) by mouth 2 (two) times daily. 135 tablet 3  . levothyroxine (SYNTHROID, LEVOTHROID) 75 MCG tablet Take 1 tablet (75 mcg total) by mouth daily. 90 tablet 3  . mometasone (NASONEX) 50 MCG/ACT nasal spray Place 2 sprays into the nose daily as needed. 17 g 5  . OXYGEN Inhale 2 L into the lungs. [place] - when exercising, walking around, and at night    . Respiratory Therapy Supplies (FLUTTER) DEVI Use as directed 1  each 0  . [DISCONTINUED] digoxin (LANOXIN) 0.25 MG tablet Take 1 tablet (0.25 mg total) by mouth daily as needed. (Patient not taking: Reported on 01/05/2017) 30 tablet 3   No facility-administered encounter medications on file as of 01/05/2017.

## 2017-01-05 NOTE — Assessment & Plan Note (Signed)
Continue 2 L of oxygen with exertion and while sleeping.

## 2017-01-05 NOTE — Patient Instructions (Signed)
Keep taking her medicines as you are doing Keep taking your inhaled medicines as you're doing Keep using her oxygen as your doing Okay to stay out of exercise for now until you go back into normal sinus rhythm I will see you back in 4 months or sooner if needed

## 2017-01-05 NOTE — Assessment & Plan Note (Signed)
Stable interval without exacerbation. Today she was counseled on the importance of maintaining a normal mucociliary clearance regimen. Continue Brovana and Pulmicort. Immunizations are up-to-date.

## 2017-01-05 NOTE — Assessment & Plan Note (Signed)
Hannah Kline seems to be going in and out of A. fib. When I measured her pulse at the beginning of the clinic visit she was in A. fib, but when I performed a formal physical exam she was in sinus rhythm.  Recently cardiology adjusted her medicines. I've advised her to reach out to them if she continues to feel symptomatic from her A. fib over the next few days. Hopefully the recent adjustment to her medications will start to take effect in the next few days.

## 2017-01-10 ENCOUNTER — Telehealth: Payer: Self-pay

## 2017-01-10 NOTE — Telephone Encounter (Signed)
-----   Message from Minna Merritts, MD sent at 01/09/2017  6:20 PM EST ----- Regarding: afib Last seen 2/26 [facility], can you see if she has noticed any difference on the flecainide 75 mg BID versus the 50 BID Does she want to increase up to 100 BID On the 100 BID, I woud like to then have her wear a zio monitor Ideally if she can record she she feels weak and we can see if it correlates to her arrhythmia. thx TG    ----- Message ----- From: Juanito Doom, MD Sent: 01/05/2017   3:14 PM To: Minna Merritts, MD  Hi Dani Gobble has continued to have a lot of symptoms from her afib and she feels pretty terrible today.  I can't find anything acute on exam.  She is having a lot of termor, feeling weak, etc.  In afib on my exam.  Any chance someone could see her again sooner?  Ruby Cola

## 2017-01-10 NOTE — Telephone Encounter (Signed)
Spoke w/ pt.  She reports that she been feeling weak and shaky and she is unsure if this is due to arrhythmia or if this is a side effect from Eliquis or increased flecainide.  Advised her of Dr. Donivan Scull recommendation.  She would like to stay on the flecainide 75 mg for a few more days and see if her sx improve. Her HR has been in the 70s and she is concerned about dropping it too low. She asks that I call her later in the week to see how she is doing.

## 2017-01-21 ENCOUNTER — Telehealth: Payer: Self-pay | Admitting: Cardiovascular Disease

## 2017-01-21 ENCOUNTER — Encounter: Payer: Self-pay | Admitting: Cardiovascular Disease

## 2017-01-21 ENCOUNTER — Ambulatory Visit (INDEPENDENT_AMBULATORY_CARE_PROVIDER_SITE_OTHER): Payer: Medicare Other | Admitting: Cardiovascular Disease

## 2017-01-21 VITALS — BP 100/60 | HR 90 | Ht 68.0 in | Wt 114.0 lb

## 2017-01-21 DIAGNOSIS — I480 Paroxysmal atrial fibrillation: Secondary | ICD-10-CM

## 2017-01-21 DIAGNOSIS — R5383 Other fatigue: Secondary | ICD-10-CM | POA: Diagnosis not present

## 2017-01-21 DIAGNOSIS — I951 Orthostatic hypotension: Secondary | ICD-10-CM

## 2017-01-21 DIAGNOSIS — J411 Mucopurulent chronic bronchitis: Secondary | ICD-10-CM

## 2017-01-21 DIAGNOSIS — J471 Bronchiectasis with (acute) exacerbation: Secondary | ICD-10-CM | POA: Diagnosis not present

## 2017-01-21 DIAGNOSIS — R5381 Other malaise: Secondary | ICD-10-CM | POA: Diagnosis not present

## 2017-01-21 MED ORDER — FLECAINIDE ACETATE 100 MG PO TABS: 100.0000 mg | ORAL_TABLET | Freq: Two times a day (BID) | ORAL | 3 refills | Status: DC

## 2017-01-21 NOTE — Telephone Encounter (Signed)
Pt states her afib is better, she has taken her drugs for this.

## 2017-01-21 NOTE — Patient Instructions (Addendum)
Medication Instructions:   Please take extra flecainide today 50 mg Tomorrow increase up to 100 mg twice a day  Please take diltiazem only for tachycardia or palpitations  Labwork:  No new labs needed  Testing/Procedures:  No further testing at this time   I recommend watching educational videos on topics of interest to you at:       [email or web address]  Enter code: [email or web address]    Follow-Up: It was a pleasure seeing you in the office today. Please call us if you have new issues that need to be addressed before your next appt.  [phone]  Your physician wants you to follow-up in: 1 month.    If you need a refill on your cardiac medications before your next appointment, please call your pharmacy.

## 2017-01-21 NOTE — Progress Notes (Signed)
Cardiology Office Note  Date:  01/21/2017   ID:  Hannah Kline, DOB Apr 25, 1932, MRN [id number]  PCP:  Crecencio Mc, MD   Chief Complaint  Patient presents with  . other    Pt. c/o that she is in A-Fib, dizzy and feels faint. Meds reviewed by the pt. verbally.     HPI:  Hannah Kline is a very pleasant 83year-old woman with a remote history of Mycobacterium avium with Chronic lung disease/Bronchiectasis diagnosed >17 yr ago, on inhalers, chronic sputum production, with a history of paroxysmal atrial fibrillation, who presents for followup of her atrial fibrillation. history of C. Difficile in 2015 after treatment with Cipro for bronchiectasis Symptoms resolved with Flagyl 2  On her last clinic visit she was having increasing frequency of atrial fibrillation episodes We increased her flecainide from 50 up to 75 mg twice a day She reports that she is doing well for about 3 weeks Then developed atrial fibrillation yesterday, severe symptoms today Palpitations, general malaise, worsening cough and congestion, sputum She took flecainide 50 mg with diltiazem 30 mg earlier today She repeated this regiment this afternoon Still remains in atrial fibrillation  Denies any orthostasis Orthostatic blood pressure measurements today 100/60 supine, 80/54 sitting, 68/48 standing asymptomatic 84/54 standing after 3 minutes no significant change in heart rate, in the 80s   EKG shows atrial fibrillation with ventricular rate 90 bpm, left axis deviation  CHADS VASC  reviewed with her, score of 3  Other past medical history Feels herrespiratory status has been slowly getting worse Now on oxygen when she walks, does not feel at she needs this when she is sitting, resting She has a home generator and a portable oxygen generator Sees pulmonary, Dr. Waunita Schooner.  Previous Chest x-ray done which shows stable COPD, pulmonary fibrosis, bronchiectasis.  In the past she took digoxin, extra flecainide for  paroxysmal atrial fibrillation which worked well for her to restore normal sinus rhythm   she has failed sotalol. Amiodarone was discontinued given underlying lung disease. She was started on Flecainide 50 mg twice a day with good success.  Episode of atrial fibrillation on April 27th to the 29th 2014. She came to the office, had an EKG. She took digoxin x2, extra flecainide. This was the longest episode of atrial fibrillation she has had. She is symptomatic in atrial fibrillation. She was also given pradaxa and took his twice a day.   Other Episodes of tachycardia in July 2013, October 2013.    PMH:   has a past medical history of Atrial fibrillation ([place]); Bacterial infection due to Pseudomonas; Bronchiectasis; COPD (chronic obstructive pulmonary disease) (HCC) ( ); Cough; Dysrhythmia; Hepatitis; History of Mycobacterium avium complex infection; Hypothyroidism; Pleurisy (12/08/11); and Shortness of breath dyspnea.  PSH:    Past Surgical History:  Procedure Laterality Date  . CATARACT EXTRACTION W/PHACO Left 01/26/2016   Procedure: CATARACT EXTRACTION PHACO AND INTRAOCULAR LENS PLACEMENT (IOC);  Surgeon: Ronnell Freshwater, MD;  Location: [facility];  Service: Ophthalmology;  Laterality: Left;  . COLONOSCOPY    . SHOULDER ARTHROSCOPY     right    Current Outpatient Prescriptions  Medication Sig Dispense Refill  . apixaban (ELIQUIS) 2.5 MG TABS tablet Take 1 tablet (2.5 mg total) by mouth 2 (two) times daily. 180 tablet 3  . arformoterol (BROVANA) 15 MCG/2ML NEBU Take 2 mLs (15 mcg total) by nebulization 2 (two) times daily. 120 mL 11  . budesonide (PULMICORT) 0.5 MG/2ML nebulizer solution Take 2 mLs (0.5 mg  total) by nebulization 2 (two) times daily. 120 mL 11  . diltiazem (CARDIZEM) 30 MG tablet Take 1 tablet (30 mg total) by mouth 3 (three) times daily as needed (atrial fibrillation). 270 tablet 3  . flecainide (TAMBOCOR) 100 MG tablet Take 1 tablet (100 mg total)  by mouth 2 (two) times daily. 60 tablet 3  . levothyroxine (SYNTHROID, LEVOTHROID) 75 MCG tablet Take 1 tablet (75 mcg total) by mouth daily. 90 tablet 3  . OXYGEN Inhale 2 L into the lungs. [place] - when exercising, walking around, and at night    . Respiratory Therapy Supplies (FLUTTER) DEVI Use as directed 1 each 0  . mometasone (NASONEX) 50 MCG/ACT nasal spray Place 2 sprays into the nose daily as needed. 17 g 5   No current facility-administered medications for this visit.      Allergies:   Ciprofloxacin; Isoniazid; and Moxifloxacin   Social History:  The patient  reports that she has never smoked. She has never used smokeless tobacco. She reports that she drinks about 4.2 - 4.8 oz of alcohol per week . She reports that she does not use drugs.   Family History:   family history includes Angina in her mother; Diabetes in her maternal grandfather; Other in her mother.    Review of Systems: Review of Systems  Constitutional: Positive for malaise/fatigue.  Respiratory: Positive for cough and shortness of breath.   Cardiovascular: Positive for palpitations.  Gastrointestinal: Negative.   Musculoskeletal: Negative.   Neurological: Positive for weakness.  Psychiatric/Behavioral: Negative.   All other systems reviewed and are negative.    PHYSICAL EXAM: VS:  BP 100/60 (BP Location: Left Arm, Patient Position: Sitting, Cuff Size: Normal)   Pulse 90   Ht _0  (1.727 m)   Wt 114 lb (51.7 kg)   BMI 17.33 kg/m  , BMI Body mass index is 17.33 kg/m. GEN: thin, no distress  HEENT: normal  Neck: no JVD, carotid bruits, or masses Cardiac, irregularly irregular, no murmurs, rubs, or gallops,no edema  Respiratory:  Rales appreciated bilaterally,  mild increased work of breathing  GI: soft, nontender, nondistended, + BS MS: no deformity or atrophy  Skin: warm and dry, no rash Neuro:  Strength and sensation are intact Psych: euthymic mood, full affect    Recent Labs: 06/14/2016: ALT 16;  BUN 21; Creatinine, Ser 0.64; Magnesium 2.0; Potassium 4.2; Sodium 138 06/15/2016: Hemoglobin 12.8; Platelets 212    Lipid Panel Lab Results  Component Value Date   CHOL 177 01/22/2016   HDL 53.50 01/22/2016   LDLCALC 108 (H) 01/22/2016   TRIG 78.0 01/22/2016      Wt Readings from Last 3 Encounters:  01/21/17 114 lb (51.7 kg)  01/05/17 118 lb (53.5 kg)  01/03/17 115 lb (52.2 kg)       ASSESSMENT AND PLAN:  Paroxysmal atrial fibrillation ([place]) - Plan: EKG 12-Lead, Ambulatory referral to Cardiac Electrophysiology Difficult to manage atrial fibrillation Likely exacerbated by underlying lung disease which is chronic, progressive Recommended she increase flecainide up to 100 mg twice a day Very limited in her diltiazem dosing given orthostasis after 2 doses today Likely she's not very symptomatic. Recommended she only take diltiazem for severe palpitations Long discussion concerning various treatment options, other medications, ablation Recommended she discuss various options with Dr. Caryl Comes, EP  Orthostatic hypotension - Plan: EKG 12-Lead, Ambulatory referral to Cardiac Electrophysiology Orthostatic on today's visit though not symptomatic Typically runs low blood pressure, exacerbated by diltiazem 30 mg 2 today  Bronchiectasis with acute exacerbation ([place]) - Plan: EKG 12-Lead, Ambulatory referral to Cardiac Electrophysiology Slight worsening of her cough and congestion, sputum, unclear if this has exacerbated her atrial fibrillation  Mucopurulent chronic bronchitis (HCC) Recommended if symptoms of cough congestion and sputum get worse that she call pulmonary  Malaise and fatigue - Plan: EKG 12-Lead, Ambulatory referral to Cardiac Electrophysiology Malaise, fatigue from underlying atrial fibrillation   Total encounter time more than 25 minutes  Greater than 50% was spent in counseling and coordination of care with the patient   Disposition:   F/U  1 month   Orders Placed  This Encounter  Procedures  . Ambulatory referral to Cardiac Electrophysiology  . EKG 12-Lead     Signed, Esmond Plants, M.D., Ph.D. 01/21/2017  [place], [place]

## 2017-01-21 NOTE — Telephone Encounter (Signed)
Pt calling stating she is having a hard time with her Afib She's been doing what we asked her to do with her medications But her HR is jumping around 59-140 Been going on a couple days but today it is taking more of her energy  Would like to know what to do  Please advise.

## 2017-01-21 NOTE — Telephone Encounter (Signed)
Spoke w/ pt.  She states that she is still feeling weak and her "afib is all over the place". Dr. Rockey Situ had an opening this afternoon - added pt on to sched at 2:40.

## 2017-01-25 DIAGNOSIS — H401492 Capsular glaucoma with pseudoexfoliation of lens, unspecified eye, moderate stage: Secondary | ICD-10-CM | POA: Diagnosis not present

## 2017-01-28 ENCOUNTER — Ambulatory Visit (INDEPENDENT_AMBULATORY_CARE_PROVIDER_SITE_OTHER): Payer: Medicare Other

## 2017-01-28 VITALS — BP 102/62 | HR 63 | Temp 97.6°F | Resp 16 | Ht 68.0 in | Wt 115.0 lb

## 2017-01-28 DIAGNOSIS — Z0000 Encounter for general adult medical examination without abnormal findings: Secondary | ICD-10-CM

## 2017-01-28 NOTE — Patient Instructions (Addendum)
  Hannah Kline , Thank you for taking time to come for your Medicare Wellness Visit. I appreciate your ongoing commitment to your health goals. Please review the following plan we discussed and let me know if I can assist you in the future.   Follow up with Dr. Derrel Nip as needed.    Bring a copy of your [place] and/or Living Will to be scanned into chart.  Have a great day!  These are the goals we discussed: Goals    . Maintain Healthy Lifestyle          Stay active Stay hydrated Healthy diet. Increase protein       This is a list of the screening recommended for you and due dates:  Health Maintenance  Topic Date Due  . Tetanus Vaccine  07/10/2019  . Flu Shot  Completed  . DEXA scan (bone density measurement)  Completed  . Pneumonia vaccines  Completed

## 2017-01-28 NOTE — Progress Notes (Signed)
Subjective:   Hannah Kline is a 83 y.o. female who presents for Medicare Annual (Subsequent) preventive examination.  Review of Systems:  No ROS.  Medicare Wellness Visit.  Cardiac Risk Factors include: advanced age (>67mn, >>72women)     Objective:     Vitals: BP 102/62 (BP Location: Right Arm, Patient Position: Sitting, Cuff Size: Normal)   Pulse 63   Temp 97.6 F (36.4 C) (Oral)   Resp 16   Ht _0  (1.727 m)   Wt 115 lb (52.2 kg)   SpO2 96%   BMI 17.49 kg/m   Body mass index is 17.49 kg/m.   Tobacco History  Smoking Status  . Never Smoker  Smokeless Tobacco  . Never Used     Counseling given: Not Answered   Past Medical History:  Diagnosis Date  . [place]   . Bacterial infection due to Pseudomonas   . Bronchiectasis   . COPD [place] obstructive [place] disease) [place]    . Cough    from bronchiectasis neb txs  . Dysrhythmia    A Fib  . Hepatitis    h/o INH(isoniazide)  . History of Mycobacterium avium complex infection    lung disease  . Hypothyroidism   . Pleurisy 12/08/11  . Shortness of breath dyspnea    Past Surgical History:  Procedure Laterality Date  . CATARACT EXTRACTION W/PHACO Left 01/26/2016   Procedure: CATARACT EXTRACTION PHACO AND INTRAOCULAR LENS PLACEMENT (IOC);  Surgeon: ARonnell Freshwater MD;  [facility]  Service: Ophthalmology;  Laterality: Left;  . COLONOSCOPY    . SHOULDER ARTHROSCOPY     right   Family History  Problem Relation Age of Onset  . Angina Mother   . Other Mother     intestional blockage  . Diabetes Maternal Grandfather    History  Sexual Activity  . Sexual activity: Not Currently    Outpatient Encounter Prescriptions as of 01/28/2017  Medication Sig  . apixaban (ELIQUIS) 2.5 MG TABS tablet Take 1 tablet (2.5 mg total) by mouth 2 (two) times daily.  .Marland Kitchenarformoterol (BROVANA) 15 MCG/2ML NEBU Take 2 mLs (15 mcg total) by nebulization 2 (two) times daily.  .  budesonide (PULMICORT) 0.5 MG/2ML nebulizer solution Take 2 mLs (0.5 mg total) by nebulization 2 (two) times daily.  .Marland Kitchendiltiazem (CARDIZEM) 30 MG tablet Take 1 tablet (30 mg total) by mouth 3 (three) times daily as needed (atrial fibrillation).  . flecainide (TAMBOCOR) 100 MG tablet Take 1 tablet (100 mg total) by mouth 2 (two) times daily.  .Marland Kitchenlevothyroxine (SYNTHROID, LEVOTHROID) 75 MCG tablet Take 1 tablet (75 mcg total) by mouth daily.  . OXYGEN Inhale 2 L into the lungs. [place] - when exercising, walking around, and at night  . Respiratory Therapy Supplies (FLUTTER) DEVI Use as directed  . [DISCONTINUED] mometasone (NASONEX) 50 MCG/ACT nasal spray Place 2 sprays into the nose daily as needed.   No facility-administered encounter medications on file as of 01/28/2017.     Activities of Daily Living In your present state of health, do you have any difficulty performing the following activities: 01/28/2017 06/14/2016  Hearing? N N  Vision? N N  Difficulty concentrating or making decisions? N N  Walking or climbing stairs? Y Y  Dressing or bathing? N N  Doing errands, shopping? N N  Preparing Food and eating ? N -  Using the Toilet? N -  In the past six months, have you accidently leaked urine? N -  Do you have problems with loss of bowel control? N -  Managing your Medications? N -  Managing your Finances? N -  Housekeeping or managing your Housekeeping? N -  Some recent data might be hidden    Patient Care Team: Crecencio Mc, MD as PCP - General (Internal Medicine) Minna Merritts, MD as Consulting Physician (Cardiology)    Assessment:    This is a routine wellness examination for Hannah Kline. The goal of the wellness visit is to assist the patient how to close the gaps in care and create a preventative care plan for the patient.   Taking calcium VIT D as appropriate/Osteoporosis reviewed.  Medications reviewed; taking without issues or barriers.  Safety issues reviewed; lives at  [facility]. Smoke detectors in the home. No firearms in the home.  Wears seatbelts when driving or riding with others. Patient does wear sunscreen or protective clothing when in direct sunlight. No violence in the home.  Patient is alert, normal appearance, oriented to person/place/and time. Correctly identified the president of the [place], recall of 2/3 words, and performing simple calculations.  Patient displays appropriate judgement and can read correct time from watch face.  No new identified risk were noted.  No failures at ADL's or IADL's.   BMI- discussed the importance of a healthy diet, water intake and exercise. Educational material provided.   Diet: Breakfast: 2 eggs, muffin Lunch: Soup  Dinner: Engineer, technical sales with lean meat Daily fluid intake: 2 cups of caffeine, 4 cups of water  Dental- every six months.  (Dr. Bronson Curb)  Eye- Visual acuity not assessed per patient preference since they have regular follow up with the ophthalmologist.  Wears corrective lenses.  Sleep patterns- Sleeps 8 hours at night.  Wakes feeling rested.  Wears oxygen at 2L when sleeping, exercising, walking.  Health maintenance gaps- closed.  Patient Concerns: None at this time. Follow up with PCP as needed.  Exercise Activities and Dietary recommendations Current Exercise Habits: Home exercise routine, Type of exercise: strength training/weights;calisthenics, Time (Minutes): 50, Frequency (Times/Week): 5, Weekly Exercise (Minutes/Week): 250, Intensity: Mild  Goals    . Maintain Healthy Lifestyle          Stay active Stay hydrated Healthy diet. Increase protein      Fall Risk Fall Risk  01/28/2017 01/14/2016 01/07/2014  Falls in the past year? No No No   Depression Screen PHQ 2/9 Scores 01/28/2017 01/14/2016 01/07/2014  PHQ - 2 Score 0 0 0     Cognitive Function MMSE - Mini Mental State Exam 01/28/2017  Orientation to time 5  Orientation to Place 5  Registration 3  Attention/ Calculation 5  Recall 3   Language- name 2 objects 2  Language- repeat 1  Language- follow 3 step command 3  Language- read & follow direction 1  Write a sentence 1  Copy design 1  Total score 30        Immunization History  Administered Date(s) Administered  . Influenza Split 08/07/2012, 08/14/2013, 09/04/2015  . Influenza Whole 08/08/2010, 07/10/2011, 08/18/2011  . Influenza, High Dose Seasonal PF 09/02/2016  . Influenza-Unspecified 08/29/2014  . Pneumococcal Conjugate-13 01/07/2014  . Pneumococcal Polysaccharide-23 08/08/2010  . Tdap 07/09/2009   Screening Tests Health Maintenance  Topic Date Due  . TETANUS/TDAP  07/10/2019  . INFLUENZA VACCINE  Completed  . DEXA SCAN  Completed  . PNA vac Low Risk Adult  Completed      Plan:    End of life planning; Advance aging;  Advanced directives discussed. Copy of current HCPOA/Living Will requested.    Medicare Attestation I have personally reviewed: The patient's medical and social history Their use of alcohol, tobacco or illicit drugs Their current medications and supplements The patient's functional ability including ADLs,fall risks, home safety risks, cognitive, and hearing and visual impairment Diet and physical activities Evidence for depression   The patient's weight, height, BMI, and visual acuity have been recorded in the chart.  I have made referrals and provided education to the patient based on review of the above and I have provided the patient with a written personalized care plan for preventive services.    During the course of the visit the patient was educated and counseled about the following appropriate screening and preventive services:   Vaccines to include Pneumoccal, Influenza, Hepatitis B, Td, Zostavax, HCV  Colorectal cancer screening-UTD  Bone density screening-UTD  Glaucoma-annual eye exam  Mammography-UTD  Nutrition counseling   Patient Instructions (the written plan) was given to the patient.    Varney Biles, LPN  [id number]/[id number]

## 2017-02-01 NOTE — Progress Notes (Signed)
  I have reviewed the above information and agree with above.   Deasha Clendenin, MD 

## 2017-02-02 ENCOUNTER — Telehealth: Payer: Self-pay | Admitting: Cardiovascular Disease

## 2017-02-02 NOTE — Telephone Encounter (Signed)
Pt c/o medication issue:  1. Name of Medication:  Flecainide   2. How are you currently taking this medication (dosage and times per day)? 100 mg twice a day   3. Are you having a reaction (difficulty breathing--STAT)? Having increased SOB   4. What is your medication issue? Was taking 50 mg twice a day and we upped the dose  Having a terrible time breathing, more than normal. Along with dry mouth  Would like to know if she can try the 75 mg  Please advise

## 2017-02-02 NOTE — Telephone Encounter (Signed)
Pt reports increased dyspnea since flecainide dose change. She uses oxygen at night therefore she does not notice SOB as much at that time. More noticeable during the day. March 16 EKG showed afib, HR 90. Flecainide was increased from 50mg  BID to 100mg  BID.  She took 75mg  BID "for a short period of time" and was asymptomatic.  Symptoms began once it was increased 100mg .  She has already taken 100mg  but is agreeable to take 50mg  tonight and await further instruction from MD. Routed to Dr. Rockey Situ to review and advise.  Marland Kitchen

## 2017-02-02 NOTE — Telephone Encounter (Signed)
Okay to go back to flecainide 75 mg twice a day Other causes of shortness of breath could be lung infection If there is more cough, may need to talk with pulmonary Can also retained fluid if she is in chronic atrial fibrillation.  Might need a diuretic to take nightly there, such as Lasix 20 mg when necessary

## 2017-02-03 ENCOUNTER — Other Ambulatory Visit: Payer: Self-pay

## 2017-02-03 MED ORDER — FLECAINIDE ACETATE 50 MG PO TABS: 75.0000 mg | ORAL_TABLET | Freq: Two times a day (BID) | ORAL | 3 refills | Status: DC

## 2017-02-03 NOTE — Telephone Encounter (Signed)
Reviewed recommendations with patient who verbalized understanding and is agreeable with plan. New prescription sent to [facility].

## 2017-02-04 ENCOUNTER — Encounter: Payer: Self-pay | Admitting: Internal Medicine

## 2017-02-04 ENCOUNTER — Ambulatory Visit (INDEPENDENT_AMBULATORY_CARE_PROVIDER_SITE_OTHER): Payer: Medicare Other | Admitting: Internal Medicine

## 2017-02-04 VITALS — BP 140/60 | HR 65 | Ht 68.0 in | Wt 115.0 lb

## 2017-02-04 DIAGNOSIS — I480 Paroxysmal atrial fibrillation: Secondary | ICD-10-CM | POA: Diagnosis not present

## 2017-02-04 DIAGNOSIS — I951 Orthostatic hypotension: Secondary | ICD-10-CM

## 2017-02-04 NOTE — Progress Notes (Signed)
ELECTROPHYSIOLOGY CONSULT NOTE  Patient ID: Hannah Kline, MRN: [id number], DOB/AGE: 1932/04/19 83 y.o. Admit date: (Not on file) Date of Consult: 02/04/2017  Primary Physician: Crecencio Mc, MD Primary Cardiologist: TG Consulting Physician TG  Chief Complaint: Atrial fibrillation   HPI Hannah Kline is a 83 y.o. female  Referred because of recurrent paroxysms of atrial fibrillation. These occur in the context of severe lung disease with a remote infection with MAI and more recent problems with bronchiectasis. She is followed very closely by Dr. Lake Bells.  She's been treated in the past with various antiarrhythmics including sotalol which is thought to be a failure, amiodarone which was discontinued because of concerns about lung injury and more recently flecainide. She has done quite well on this with no episodes of atrial fibrillation until about a month ago in the last year until she had 3 in the last 6 weeks. The most recent one was about 10 days ago. More notably however there was one lasted 3 days at the end of February and since which time she has felt that her breathing has gotten worse. With that event her flecainide dose was increased from 50--75 mg twice daily.  Her lung disease is oxygen dependent.  Antiarrhythmics Date  amiodarone stopped/   sotalol failure       [facility] Past Medical History:  Diagnosis Date  . Atrial fibrillation ([place])   . Bacterial infection due to Pseudomonas   . Bronchiectasis   . COPD (chronic obstructive pulmonary disease) ([place])    . Cough    from bronchiectasis neb txs  . Dysrhythmia    A Fib  . Hepatitis    h/o INH(isoniazide)  . History of Mycobacterium avium complex infection    lung disease  . Hypothyroidism   . Pleurisy 12/08/11  . Shortness of breath dyspnea       Surgical History:  Past Surgical History:  Procedure Laterality Date  . CATARACT EXTRACTION W/PHACO Left 01/26/2016   Procedure: CATARACT EXTRACTION PHACO  AND INTRAOCULAR LENS PLACEMENT (IOC);  Surgeon: Ronnell Freshwater, MD;  Location: [facility];  Service: Ophthalmology;  Laterality: Left;  . COLONOSCOPY    . SHOULDER ARTHROSCOPY     right     Home Meds: Prior to Admission medications   Medication Sig Start Date End Date Taking? Authorizing Provider  apixaban (ELIQUIS) 2.5 MG TABS tablet Take 1 tablet (2.5 mg total) by mouth 2 (two) times daily. 01/03/17  Yes Minna Merritts, MD  arformoterol (BROVANA) 15 MCG/2ML NEBU Take 2 mLs (15 mcg total) by nebulization 2 (two) times daily. 03/09/16  Yes Juanito Doom, MD  budesonide (PULMICORT) 0.5 MG/2ML nebulizer solution Take 2 mLs (0.5 mg total) by nebulization 2 (two) times daily. 03/09/16  Yes Juanito Doom, MD  diltiazem (CARDIZEM) 30 MG tablet Take 1 tablet (30 mg total) by mouth 3 (three) times daily as needed (atrial fibrillation). 01/03/17  Yes Minna Merritts, MD  flecainide (TAMBOCOR) 50 MG tablet Take 1.5 tablets (75 mg total) by mouth 2 (two) times daily. 02/03/17  Yes Minna Merritts, MD  levothyroxine (SYNTHROID, LEVOTHROID) 75 MCG tablet Take 1 tablet (75 mcg total) by mouth daily. 02/27/16  Yes Crecencio Mc, MD  OXYGEN Inhale 2 L into the lungs. [place] - when exercising, walking around, and at night   Yes Historical Provider, MD  Respiratory Therapy Supplies (FLUTTER) DEVI Use as directed 07/21/15  Yes Tanda Rockers, MD  Allergies:  Allergies  Allergen Reactions  . Ciprofloxacin Other (See Comments)    c-diff  . Isoniazid     Hepatitis, weakness, nausea  . Moxifloxacin     Avelox  REACTION: chills, fainting    Social History   Social History  . Marital status: Married    Spouse name: N/A  . Number of children: 5  . Years of education: N/A   Occupational History  . 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 Smoking status: Never Smoker  . Smokeless tobacco: Never Used  . Alcohol use 4.2 - 4.8 oz/week    7 Glasses of wine per  week  . Drug use: No  . Sexual activity: Not Currently   Other Topics Concern  . Not on file   Social History Narrative  . No narrative on file     Family History  Problem Relation Age of Onset  . Angina Mother   . Other Mother     intestional blockage  . Diabetes Maternal Grandfather      ROS:  Please see the history of present illness.     All other systems reviewed and negative.    Physical Exam:  Blood pressure 140/60, pulse 65, height _0  (1.727 m), weight 115 lb (52.2 kg), SpO2 93 %. General: Well developed, cachectic oxygen wearing female I ss. Head: Normocephalic, atraumatic, sclera non-icteric, no xanthomas, nares are without discharge. EENT: normal  Lymph Nodes:  none Neck: Negative for carotid bruits. JVD not elevated. Back:without scoliosis kyphosis Lungs: Clear bilaterally to auscultation without wheezes, rales, or rhonchi. Breathing is unlabored. Heart: RRR with S1 S2. 2/6 systolic  murmur . No rubs, or gallops appreciated. Abdomen: Soft, non-tender, non-distended with normoactive bowel sounds. No hepatomegaly. No rebound/guarding. No obvious abdominal masses. Msk:  Strength and tone appear normal for age. Extremities: No clubbing or cyanosis. No** edema.  Distal pedal pulses are 2+ and equal bilaterally. Skin: Warm and Dry Neuro: Alert and oriented X 3. CN III-XII intact Grossly normal sensory and motor function . Psych:  Responds to questions appropriately with a normal affect.      Labs: Cardiac Enzymes No results for input(s): CKTOTAL, CKMB, TROPONINI in the last 72 hours. CBC Lab Results  Component Value Date   WBC 11.0 06/15/2016   HGB 12.8 06/15/2016   HCT 37.4 06/15/2016   MCV 94.8 06/15/2016   PLT 212 06/15/2016   PROTIME: No results for input(s): LABPROT, INR in the last 72 hours. Chemistry No results for input(s): NA, K, CL, CO2, BUN, CREATININE, CALCIUM, PROT, BILITOT, ALKPHOS, ALT, AST, GLUCOSE in the last 168 hours.  Invalid  input(s): LABALBU Lipids Lab Results  Component Value Date   CHOL 177 01/22/2016   HDL 53.50 01/22/2016   LDLCALC 108 (H) 01/22/2016   TRIG 78.0 01/22/2016   BNP No results found for: PROBNP Thyroid Function Tests: No results for input(s): TSH, T4TOTAL, T3FREE, THYROIDAB in the last 72 hours.  Invalid input(s): FREET3 Miscellaneous No results found for: DDIMER  Radiology/Studies:  No results found.  EKG:  Sinus rhythm at 72 Intervals 23/13/42   Assessment and Plan:   Paroxysmal atrial fibrillation  Oxygen dependent bronchiectasis  Acute/chronic dypnea  Orthostatic hypotension  Her dyspnea is worse. Reviewing the literature with Iantha Fallen Pharm.D. we have noted that flecainide is associated with dyspnea 10% of the time. We are currently reviewing the mechanism is to see whether it is dose or duration related. I'm not sure that  we may not need to undertake a exclusion trial. However, given her complex lung situation averaged out to Dr. Lake Bells so as to do this in concert.  She is appropriately anticoagulated given her age and weight. We will undertake an echocardiogram to look at left atrial structure and left ventricular function  I have spoken with Dr. Lake Bells. He will see her to look at her lung issues. It is not his impression that flecainide is a likely culprit here.   Despite quite surprisingly drastic changes in blood pressure with standing and low blood pressures at her last visit she has no symptoms that I can identify associated. We will follow this along. The defined      Virl Axe

## 2017-02-04 NOTE — Patient Instructions (Signed)
Medication Instructions: - Your physician recommends that you continue on your current medications as directed. Please refer to the Current Medication list given to you today.  Labwork: - none ordered  Procedures/Testing: - Your physician has requested that you have an echocardiogram- in [place]. Echocardiography is a painless test that uses sound waves to create images of your heart. It provides your doctor with information about the size and shape of your heart and how well your heart's chambers and valves are working. This procedure takes approximately one hour. There are no restrictions for this procedure.  Follow-Up: - pending echo results  Any Additional Special Instructions Will Be Listed Below (If Applicable).     If you need a refill on your cardiac medications before your next appointment, please call your pharmacy.

## 2017-02-07 ENCOUNTER — Other Ambulatory Visit: Payer: Self-pay

## 2017-02-07 ENCOUNTER — Ambulatory Visit (INDEPENDENT_AMBULATORY_CARE_PROVIDER_SITE_OTHER): Payer: Medicare Other

## 2017-02-07 DIAGNOSIS — I480 Paroxysmal atrial fibrillation: Secondary | ICD-10-CM | POA: Diagnosis not present

## 2017-02-10 ENCOUNTER — Other Ambulatory Visit: Payer: Self-pay | Admitting: Internal Medicine

## 2017-02-10 DIAGNOSIS — E039 Hypothyroidism, unspecified: Secondary | ICD-10-CM

## 2017-02-10 DIAGNOSIS — R5383 Other fatigue: Secondary | ICD-10-CM

## 2017-02-10 DIAGNOSIS — R5381 Other malaise: Secondary | ICD-10-CM

## 2017-02-10 NOTE — Telephone Encounter (Signed)
Refilled: 02/27/2016 Last OV: 01/13/2017 Next OV: not scheduled Last TSH: 01/22/2016

## 2017-02-10 NOTE — Telephone Encounter (Signed)
Spoke with pt and scheduled her a lab appt for 02/16/2017 @ 11:15 to have TSH rechked.

## 2017-02-10 NOTE — Telephone Encounter (Signed)
Refill for 30 days only.  tsh level  NEEDED prior to any more refills ,. Labs ordered

## 2017-02-14 ENCOUNTER — Telehealth: Payer: Self-pay

## 2017-02-14 NOTE — Telephone Encounter (Signed)
Spoke with patient and appointment was scheduled for 215 pm on 4/10 with DM. Pt verbalized understand of appointment and time. Nothing further is needed.

## 2017-02-14 NOTE — Telephone Encounter (Signed)
lmtcb X1 for pt.   Pt can be scheduled in a blocked slot per BQ on tomorrow (02/15/2017) or double booked on 02/16/17 if 4/10 is full.

## 2017-02-14 NOTE — Telephone Encounter (Signed)
-----   Message from Juanito Doom, MD sent at 02/13/2017  8:25 PM EDT ----- Hi  Can you try to get her in to see me this week? Thanks B

## 2017-02-15 ENCOUNTER — Ambulatory Visit (INDEPENDENT_AMBULATORY_CARE_PROVIDER_SITE_OTHER): Payer: Medicare Other | Admitting: Pulmonary Disease

## 2017-02-15 ENCOUNTER — Encounter: Payer: Self-pay | Admitting: Pulmonary Disease

## 2017-02-15 VITALS — BP 138/66 | HR 75 | Ht 68.0 in | Wt 112.8 lb

## 2017-02-15 DIAGNOSIS — R0902 Hypoxemia: Secondary | ICD-10-CM | POA: Diagnosis not present

## 2017-02-15 DIAGNOSIS — D1800 Hemangioma unspecified site: Secondary | ICD-10-CM | POA: Diagnosis not present

## 2017-02-15 DIAGNOSIS — R531 Weakness: Secondary | ICD-10-CM

## 2017-02-15 DIAGNOSIS — J9611 Chronic respiratory failure with hypoxia: Secondary | ICD-10-CM | POA: Diagnosis not present

## 2017-02-15 DIAGNOSIS — Z9981 Dependence on supplemental oxygen: Secondary | ICD-10-CM

## 2017-02-15 DIAGNOSIS — L280 Lichen simplex chronicus: Secondary | ICD-10-CM | POA: Diagnosis not present

## 2017-02-15 DIAGNOSIS — L821 Other seborrheic keratosis: Secondary | ICD-10-CM | POA: Diagnosis not present

## 2017-02-15 DIAGNOSIS — J471 Bronchiectasis with (acute) exacerbation: Secondary | ICD-10-CM

## 2017-02-15 DIAGNOSIS — L820 Inflamed seborrheic keratosis: Secondary | ICD-10-CM | POA: Diagnosis not present

## 2017-02-15 DIAGNOSIS — L299 Pruritus, unspecified: Secondary | ICD-10-CM | POA: Diagnosis not present

## 2017-02-15 NOTE — Assessment & Plan Note (Addendum)
Walk 250 feet today in clinic on room air were for her O2 saturation dropped to 87%, on 3 L nasal cannula her oxygenation was normal.

## 2017-02-15 NOTE — Progress Notes (Signed)
Subjective:    Patient ID: Hannah Kline, female    DOB: 1931-12-05, 83 y.o.   MRN: [id number] Synopsis: Hannah Kline established care with the [facility] pulmonary office in February 2013 for bronchiectasis due to [place]. She was diagnosed at age 80 in [place]. She was treated with rifampin ethambutol and clarithromycin for 2 years. Since then she does not believe she was ever grown out MAI.  She had c.diff colitis in 2015 which was refractory to treatment.    HPI  Chief Complaint  Patient presents with  . Follow-up    pt c/o worsening Afib episodes- following up with cardiology for this.  Pt c/o increased weakness, sob.    Hannah Kline came back because we asked her to see [place] to help evlaute her lungs in the setting of her severe bronchiectasis and atrial fibrillation.   She tells me that she has been feeling pretty miserable lately after several recurrent episodes of atrial fibrillation.  She had been prescribed flecainide and had her dose increased on 3/16.  She says that several days after taking the increased fatigue, shaky, no appetite.  She didn't feel any palpitations at.   During this time (last 6 weeks) she may have more coughing than normal and perhaps more mucus production.  She says that while the mucus has increased slightly, this is not as dramatic as the weakness and fatigue.  No wheezing.  She is trying to go to yoga.    Her weight is down 6 pounds during this time.    Past Medical History:  Diagnosis Date  . Atrial fibrillation ([place])   . Bacterial infection due to Pseudomonas   . Bronchiectasis   . COPD (chronic obstructive pulmonary disease) ([place])    . Cough    from bronchiectasis neb txs  . Dysrhythmia    A Fib  . Hepatitis    h/o INH(isoniazide)  . History of Mycobacterium avium complex infection    lung disease  . Hypothyroidism   . Pleurisy 12/08/11  . Shortness of breath dyspnea      Review of Systems  Constitutional: Negative for  appetite change, chills, fatigue, fever and unexpected weight change.  HENT: Negative for congestion, ear pain and nosebleeds.   Respiratory: Negative for cough, choking and shortness of breath.   Cardiovascular: Negative for chest pain and leg swelling.       Objective:   Physical Exam Vitals:   02/15/17 1428  BP: 138/66  Pulse: 75  SpO2: 93%  Weight: 112 lb 12.8 oz (51.2 kg)  Height: 5' 8"  (1.727 m)   2L   Gen: well appearing HENT: OP clear, TM's clear, neck supple PULM: Crackles wheezing Left lung which clear with deep breathing B, normal percussion CV: RRR, no mgr, trace edema GI: BS+, soft, nontender Derm: no cyanosis or rash Psyche: normal mood and affect    Micro Review of 2012 sputum microbiology: Pan sensitive pseudomonas  10/11/2012 Sputum: PSEUDOMONAS AERUGINOSA       Antibiotic  Sensitivity  Microscan  Status    CEFEPIME  Sensitive  2  Final    CEFTAZIDIME  Sensitive  <=1  Final    CIPROFLOXACIN  Intermediate  2  Final    GENTAMICIN  Sensitive  <=1  Final    IMIPENEM  Sensitive  1  Final    LEVOFLOXACIN  Intermediate  4  Final    PIP/TAZO  Sensitive  <=4  Final    TOBRAMYCIN  Sensitive  <=  1  Final    January 2014 Sputum AFB > neg x3   2015 Sputum> Pseudomonas , Cipro/Levofloxacin Intermediate suscept; Cefepime,Ceftaz, Imi, Pip-Tazo, Gent all susceptible  September 2015 sputum culture> Klebsiella resistant to amoxicillin but susceptible to Augmentin and multiple other agents  January - February 2016 Sputum AFB negative x 3  12/2015 AFB sputum > MAI 03/2016 AFB sputum > negative  PFT 09/2010 Full PFT: Ratio 63%, FEV1 1.49L (67% pred) December 2013 simple spirometry FEV1 0.8 L January 2014 simple spirometry >> not reproducible by ATS standards 11/2013 FEV1 0.93 L April 2018 simple spirometry ratio 61%, FEV1 0.67 L 30% predicted, FVC 1.0 936% predicted  Imaging 10/2011 CT Chest: impressive upper lobe cystic bronchiectasis, scattered lower lobe  tree-in-bud abnormalities and scattered nodules; One RML solid nodule measures 7.47m  January 2013 CT chest [facility]: Stable upper lobe cystic bronchiectasis also present in the right middle lobe. Scattered tree in bud abnormalities and nodules throughout all lung fields roughly unchanged from prior. Right middle lobe nodule is no longer visible but there is an 834mnodule in the right upper lobe.   Multiple records from her visits with cardiology in March reviewed were she was cared for for atrial fibrillation with flecainide      Assessment & Plan:   BRONCHIECTASIS Hannah Kline's disease is very severe as evidence by today's spirometry test which again showed very severe obstruction.  However, based on her reported today I don't get a sense that the bronchiectasis is flaring up.  There is no question that her bronchiectasis has contributed to how badly she has felt in the last month, but given the chronicity of events I think that the flecainide contributed at least in part as well.  We will order a HRCT to see if there is worsening structural lung disease.  Plan: Ambulatory oximetry monitoring High-resolution CT scan of the chest Continue mucociliary clearance measures Continue Brovana and Pulmicort  Hypoxemia requiring supplemental oxygen Walk 250 feet today in clinic on room air were for her O2 saturation dropped to 87%, on 3 L nasal cannula her oxygenation was normal.   Weakness generalized I worry that the flecainide may have contributed to her symptoms of generalized weakness in March. However, trace is a complex patient and has severe lung disease so as detailed above we will repeat a workup to see if there is clear evidence that her lung disease has worsened.  > 50% of today's 50 minute visit spent face to face  Updated Medication List Outpatient Encounter Prescriptions as of 02/15/2017  Medication Sig  . apixaban (ELIQUIS) 2.5 MG TABS tablet Take 1 tablet (2.5 mg total) by mouth 2  (two) times daily.  . Marland Kitchenrformoterol (BROVANA) 15 MCG/2ML NEBU Take 2 mLs (15 mcg total) by nebulization 2 (two) times daily.  . budesonide (PULMICORT) 0.5 MG/2ML nebulizer solution Take 2 mLs (0.5 mg total) by nebulization 2 (two) times daily.  . Marland Kitcheniltiazem (CARDIZEM) 30 MG tablet Take 1 tablet (30 mg total) by mouth 3 (three) times daily as needed (atrial fibrillation).  . flecainide (TAMBOCOR) 50 MG tablet Take 1.5 tablets (75 mg total) by mouth 2 (two) times daily.  . Marland Kitchenevothyroxine (SYNTHROID, LEVOTHROID) 75 MCG tablet TAKE 1 TABLET (75 MCG TOTAL) BY MOUTH DAILY.  . Marland KitchenXYGEN Inhale 2 L into the lungs. [place] - when exercising, walking around, and at night  . Respiratory Therapy Supplies (FLUTTER) DEVI Use as directed   No facility-administered encounter medications on file as of 02/15/2017.

## 2017-02-15 NOTE — Assessment & Plan Note (Addendum)
Hannah Kline's disease is very severe as evidence by today's spirometry test which again showed very severe obstruction.  However, based on her reported today I don't get a sense that the bronchiectasis is flaring up.  There is no question that her bronchiectasis has contributed to how badly she has felt in the last month, but given the chronicity of events I think that the flecainide contributed at least in part as well.  We will order a HRCT to see if there is worsening structural lung disease.  Plan: Ambulatory oximetry monitoring High-resolution CT scan of the chest Continue mucociliary clearance measures Continue Brovana and Pulmicort

## 2017-02-15 NOTE — Patient Instructions (Signed)
We will arrange for a CT scan of your chest Keep taking her medicines as you're doing I will talk to Dr. Caryl Comes about her situation Keep your appointment as previously scheduled

## 2017-02-15 NOTE — Assessment & Plan Note (Signed)
I worry that the flecainide may have contributed to her symptoms of generalized weakness in March. However, trace is a complex patient and has severe lung disease so as detailed above we will repeat a workup to see if there is clear evidence that her lung disease has worsened.

## 2017-02-16 ENCOUNTER — Other Ambulatory Visit (INDEPENDENT_AMBULATORY_CARE_PROVIDER_SITE_OTHER): Payer: Medicare Other

## 2017-02-16 DIAGNOSIS — R5381 Other malaise: Secondary | ICD-10-CM | POA: Diagnosis not present

## 2017-02-16 DIAGNOSIS — R5383 Other fatigue: Secondary | ICD-10-CM | POA: Diagnosis not present

## 2017-02-16 DIAGNOSIS — E039 Hypothyroidism, unspecified: Secondary | ICD-10-CM | POA: Diagnosis not present

## 2017-02-16 LAB — COMPREHENSIVE METABOLIC PANEL
ALT: 14 U/L (ref 0–35)
AST: 21 U/L (ref 0–37)
Albumin: 3.9 g/dL (ref 3.5–5.2)
Alkaline Phosphatase: 82 U/L (ref 39–117)
BILIRUBIN TOTAL: 0.4 mg/dL (ref 0.2–1.2)
BUN: 17 mg/dL (ref 6–23)
CO2: 31 meq/L (ref 19–32)
CREATININE: 0.76 mg/dL (ref 0.40–1.20)
Calcium: 9 mg/dL (ref 8.4–10.5)
Chloride: 100 mEq/L (ref 96–112)
GFR: 77.32 mL/min (ref >60.00)
Glucose, Bld: 66 mg/dL — ABNORMAL LOW (ref 70–99)
Potassium: 4 mEq/L (ref 3.5–5.1)
SODIUM: 139 meq/L (ref 135–145)
TOTAL PROTEIN: 7.2 g/dL (ref 6.0–8.3)

## 2017-02-16 LAB — TSH: TSH: 3.18 u[IU]/mL (ref 0.35–4.50)

## 2017-02-17 ENCOUNTER — Ambulatory Visit
Admission: RE | Admit: 2017-02-17 | Discharge: 2017-02-17 | Disposition: A | Discharge status: Home / Self Care | Payer: Medicare Other | Source: Ambulatory Visit | Attending: Pulmonary Disease | Admitting: Pulmonary Disease

## 2017-02-17 DIAGNOSIS — I2510 Atherosclerotic heart disease of native coronary artery without angina pectoris: Secondary | ICD-10-CM | POA: Insufficient documentation

## 2017-02-17 DIAGNOSIS — I700 Atherosclerosis of aorta: Secondary | ICD-10-CM | POA: Diagnosis not present

## 2017-02-17 DIAGNOSIS — J471 Bronchiectasis with (acute) exacerbation: Secondary | ICD-10-CM | POA: Insufficient documentation

## 2017-02-18 ENCOUNTER — Telehealth: Payer: Self-pay | Admitting: Pulmonary Disease

## 2017-02-18 NOTE — Telephone Encounter (Signed)
A, Please let the patient know this showed her bronchiectasis isn't any worse. Thanks,   Called and spoke with pt and she is aware of results. Nothing further is needed.

## 2017-02-19 ENCOUNTER — Encounter: Payer: Self-pay | Admitting: Internal Medicine

## 2017-02-19 ENCOUNTER — Other Ambulatory Visit: Payer: Self-pay | Admitting: Internal Medicine

## 2017-02-19 DIAGNOSIS — E039 Hypothyroidism, unspecified: Secondary | ICD-10-CM

## 2017-02-19 MED ORDER — LEVOTHYROXINE SODIUM 75 MCG PO TABS: 75.0000 ug | ORAL_TABLET | Freq: Every day | ORAL | 1 refills | Status: DC

## 2017-02-19 NOTE — Progress Notes (Signed)
Lab Results  Component Value Date   TSH 3.18 02/16/2017

## 2017-02-22 ENCOUNTER — Ambulatory Visit: Payer: Self-pay | Admitting: Cardiovascular Disease

## 2017-02-27 NOTE — Progress Notes (Signed)
Cardiology Office Note  Date:  02/28/2017   ID:  Hannah Kline, DOB Apr 10, 1932, MRN [id number]  PCP:  Crecencio Mc, MD   Chief Complaint  Patient presents with  . other     1 MONTH, echo completed 4/2. Pt c/o sob/weakness. Reviewed meds with pt verbally.    HPI:  Hannah Kline is a very pleasant 83year-old woman with a remote history of Mycobacterium avium with Chronic lung disease/Bronchiectasis diagnosed >17 yr ago, on inhalers,  chronic sputum production,  paroxysmal atrial fibrillation,  C. Difficile in 2015 after treatment with Cipro for bronchiectasis Symptoms resolved with Flagyl 2 who presents for followup of her atrial fibrillation.  Last atrial fib with me on her last clinic visit Does not appreciate atrial fibrillation since that time Seen by Dr. Caryl Comes, EP Echocardiogram ordered This showed normal LV function no left atrial dilatation with moderate to severely elevated right heart pressures greater than 60  Very weak in general, does not feel good. Still does exercise, some yoga from a chair Anorexia, down 3 pounds in the past month Food does not look good Seen by pulmonary, she reports able to walk well, no significant change  Reports that she feels better on flecainide 50 twice a day  CT scan chest reviewed, no significant change in findings No significant change in lung disease  EKG on today's visit personally reviewed by myself showing normal sinus rhythm rate 74 bpm incomplete right bundle branch block, left anterior fascicular block  Other past medical history reviewed On her last clinic visit was orthostatic while in atrial fibrillation after taking extra diltiazem  CHADS VASC  reviewed with her, score of 3  Other past medical history Feels herrespiratory status has been slowly getting worse Now on oxygen when she walks, does not feel at she needs this when she is sitting, resting She has a home generator and a portable oxygen generator Sees  pulmonary, Dr. Waunita Schooner.  Previous Chest x-ray done which shows stable COPD, pulmonary fibrosis, bronchiectasis.  In the past she took digoxin, extra flecainide for paroxysmal atrial fibrillation which worked well for her to restore normal sinus rhythm   she has failed sotalol. Amiodarone was discontinued given underlying lung disease. She was started on Flecainide 50 mg twice a day with good success.  Episode of atrial fibrillation on April 27th to the 29th 2014. She came to the office, had an EKG. She took digoxin x2, extra flecainide. This was the longest episode of atrial fibrillation she has had. She is symptomatic in atrial fibrillation. She was also given pradaxa and took his twice a day.   Other Episodes of tachycardia in July 2013, October 2013.    PMH:   has a past medical history of Atrial fibrillation ([place]); Bacterial infection due to Pseudomonas; Bronchiectasis; COPD (chronic obstructive pulmonary disease) ([facility]) ( ); Cough; Dysrhythmia; Hepatitis; History of Mycobacterium avium complex infection; Hypothyroidism; Pleurisy (12/08/11); and Shortness of breath dyspnea.  PSH:    Past Surgical History:  Procedure Laterality Date  . CATARACT EXTRACTION W/PHACO Left 01/26/2016   Procedure: CATARACT EXTRACTION PHACO AND INTRAOCULAR LENS PLACEMENT (IOC);  Surgeon: Ronnell Freshwater, MD;  Location: [facility];  Service: Ophthalmology;  Laterality: Left;  . COLONOSCOPY    . SHOULDER ARTHROSCOPY     right    Current Outpatient Prescriptions  Medication Sig Dispense Refill  . apixaban (ELIQUIS) 2.5 MG TABS tablet Take 1 tablet (2.5 mg total) by mouth 2 (two) times daily. 180 tablet 3  .  arformoterol (BROVANA) 15 MCG/2ML NEBU Take 2 mLs (15 mcg total) by nebulization 2 (two) times daily. 120 mL 11  . budesonide (PULMICORT) 0.5 MG/2ML nebulizer solution Take 2 mLs (0.5 mg total) by nebulization 2 (two) times daily. 120 mL 11  . diltiazem (CARDIZEM) 30 MG tablet Take 1  tablet (30 mg total) by mouth 3 (three) times daily as needed (atrial fibrillation). 270 tablet 3  . flecainide (TAMBOCOR) 50 MG tablet Take 1.5 tablets (75 mg total) by mouth 2 (two) times daily. 60 tablet 3  . levothyroxine (SYNTHROID, LEVOTHROID) 75 MCG tablet Take 1 tablet (75 mcg total) by mouth daily. 90 tablet 1  . OXYGEN Inhale 2 L into the lungs. [place] - when exercising, walking around, and at night    . Respiratory Therapy Supplies (FLUTTER) DEVI Use as directed 1 each 0   No current facility-administered medications for this visit.      Allergies:   Ciprofloxacin; Isoniazid; and Moxifloxacin   Social History:  The patient  reports that she has never smoked. She has never used smokeless tobacco. She reports that she drinks about 4.2 - 4.8 oz of alcohol per week . She reports that she does not use drugs.   Family History:   family history includes Angina in her mother; Diabetes in her maternal grandfather; Other in her mother.    Review of Systems: Review of Systems  Constitutional: Positive for malaise/fatigue.  Respiratory: Positive for cough and shortness of breath.   Gastrointestinal: Negative.   Musculoskeletal: Negative.   Neurological: Positive for weakness.  Psychiatric/Behavioral: Negative.   All other systems reviewed and are negative.    PHYSICAL EXAM: VS:  BP (!) 96/50 (BP Location: Left Arm, Patient Position: Sitting, Cuff Size: Normal)   Pulse 74   Ht 5' 8"  (1.727 m)   Wt 111 lb 8 oz (50.6 kg)   SpO2 94%   BMI 16.95 kg/m  , BMI Body mass index is 16.95 kg/m. GEN: thin, no distress  HEENT: normal  Neck: no JVD, carotid bruits, or masses Cardiac, irregularly irregular, no murmurs, rubs, or gallops,no edema  Respiratory:  Rales appreciated bilaterally,  mild increased work of breathing  GI: soft, nontender, nondistended, + BS MS: no deformity or atrophy  Skin: warm and dry, no rash Neuro:  Strength and sensation are intact Psych: euthymic mood, full  affect    Recent Labs: 06/14/2016: Magnesium 2.0 06/15/2016: Hemoglobin 12.8; Platelets 212 02/16/2017: ALT 14; BUN 17; Creatinine, Ser 0.76; Potassium 4.0; Sodium 139; TSH 3.18    Lipid Panel Lab Results  Component Value Date   CHOL 177 01/22/2016   HDL 53.50 01/22/2016   LDLCALC 108 (H) 01/22/2016   TRIG 78.0 01/22/2016      Wt Readings from Last 3 Encounters:  02/28/17 111 lb 8 oz (50.6 kg)  02/15/17 112 lb 12.8 oz (51.2 kg)  02/04/17 115 lb (52.2 kg)       ASSESSMENT AND PLAN:   Paroxysmal atrial fibrillation ([place]) - Plan: EKG 12-Lead,  Maintaining normal sinus rhythm She prefers to stay on flecainide 50 twice a day Recommended she take extra diltiazem and digoxin for breakthrough atrial fibrillation He was other options for antiarrhythmics  Orthostatic hypotension - Plan: EKG 12-Lead, Ambulatory referral to Cardiac Electrophysiology Blood pressure low, continues to lose weight from anorexia  Bronchiectasis with acute exacerbation ([place]) - Plan: EKG 12-Lead, Ambulatory referral to Cardiac Electrophysiology Followed by pulmonary, no change on recent CT scan  Mucopurulent chronic bronchitis ([place]) Recommended  if symptoms of cough congestion and sputum get worse that she call pulmonary  Malaise and fatigue - Plan: EKG 12-Lead, Ambulatory referral to Cardiac Electrophysiology Etiology unclear, likely multifactorial from issues above  Pulmonary hypertension She options, recommended she try Lasix one or 2 times per week Other option includes starting revatio 20 mg 3 times a day but recommended she start on half doses given low blood pressure. She will try Lasix first    Total encounter time more than 25 minutes  Greater than 50% was spent in counseling and coordination of care with the patient   Disposition:   F/U  3 month   Orders Placed This Encounter  Procedures  . EKG 12-Lead     Signed, Esmond Plants, M.D., Ph.D. 02/28/2017  [place], [place]

## 2017-02-28 ENCOUNTER — Ambulatory Visit (INDEPENDENT_AMBULATORY_CARE_PROVIDER_SITE_OTHER): Payer: Medicare Other | Admitting: Cardiovascular Disease

## 2017-02-28 ENCOUNTER — Encounter: Payer: Self-pay | Admitting: Cardiovascular Disease

## 2017-02-28 VITALS — BP 96/50 | HR 74 | Ht 68.0 in | Wt 111.5 lb

## 2017-02-28 DIAGNOSIS — E441 Mild protein-calorie malnutrition: Secondary | ICD-10-CM

## 2017-02-28 DIAGNOSIS — J471 Bronchiectasis with (acute) exacerbation: Secondary | ICD-10-CM

## 2017-02-28 DIAGNOSIS — I951 Orthostatic hypotension: Secondary | ICD-10-CM | POA: Diagnosis not present

## 2017-02-28 DIAGNOSIS — I272 Pulmonary hypertension, unspecified: Secondary | ICD-10-CM

## 2017-02-28 DIAGNOSIS — R5383 Other fatigue: Secondary | ICD-10-CM

## 2017-02-28 DIAGNOSIS — R5381 Other malaise: Secondary | ICD-10-CM | POA: Diagnosis not present

## 2017-02-28 DIAGNOSIS — I480 Paroxysmal atrial fibrillation: Secondary | ICD-10-CM | POA: Diagnosis not present

## 2017-02-28 MED ORDER — SILDENAFIL CITRATE 20 MG PO TABS: 20.0000 mg | ORAL_TABLET | Freq: Three times a day (TID) | ORAL | 6 refills | Status: DC

## 2017-02-28 MED ORDER — POTASSIUM CHLORIDE ER 10 MEQ PO TBCR: 10.0000 meq | EXTENDED_RELEASE_TABLET | ORAL | 6 refills | Status: DC | PRN

## 2017-02-28 MED ORDER — FUROSEMIDE 20 MG PO TABS: 20.0000 mg | ORAL_TABLET | ORAL | 6 refills | Status: DC | PRN

## 2017-02-28 NOTE — Patient Instructions (Addendum)
Medication Instructions:   Please take lasix 20 mg with potassium 10 meq sparingly for shortness of breath Perhaps 2 to 3 times a week   If needed start the revatio 20 mg three times a day Perhaps start with a 1/2 pill three times a day  Labwork:  No new labs needed  Testing/Procedures:  No further testing at this time   I recommend watching educational videos on topics of interest to you at:       [email or web address].com  Enter code: [email or web address]    Follow-Up: It was a pleasure seeing you in the office today. Please call us if you have new issues that need to be addressed before your next appt.  [phone]  Your physician wants you to follow-up in: 3 months.  You will receive a reminder letter in the mail two months in advance. If you don't receive a letter, please call our office to schedule the follow-up appointment.  If you need a refill on your cardiac medications before your next appointment, please call your pharmacy.

## 2017-03-01 ENCOUNTER — Telehealth: Payer: Self-pay | Admitting: *Deleted

## 2017-03-01 NOTE — Telephone Encounter (Signed)
Pt has been approved Sildenafil 11/06/16-11/07/17.

## 2017-03-07 NOTE — Telephone Encounter (Signed)
Mailed unread message to patient, thanks 

## 2017-03-10 ENCOUNTER — Telehealth: Payer: Self-pay | Admitting: Cardiovascular Disease

## 2017-03-10 ENCOUNTER — Other Ambulatory Visit: Payer: Self-pay | Admitting: Internal Medicine

## 2017-03-10 DIAGNOSIS — E039 Hypothyroidism, unspecified: Secondary | ICD-10-CM

## 2017-03-10 DIAGNOSIS — L989 Disorder of the skin and subcutaneous tissue, unspecified: Secondary | ICD-10-CM | POA: Diagnosis not present

## 2017-03-10 DIAGNOSIS — L309 Dermatitis, unspecified: Secondary | ICD-10-CM | POA: Diagnosis not present

## 2017-03-10 NOTE — Telephone Encounter (Signed)
Pt calling stating is there any connections for rash/itchiness with her taking the Flecainide She's had that for a while and has gone to seen Dermatologist and they can't seem to find anything. She states it has been going on since around the time she started this medication Would like some advise on this  Please advise.

## 2017-03-10 NOTE — Telephone Encounter (Signed)
Pt would like to know if rash &/or itching could be a side effect of flecainide. She is currently on 75 mg daily - her dose has been adjusted and increased in the last month.  Normally I would tell a pt to hold a med to see if sx improve, but I want to make sure it's ok to tell her to do that w/ flecainide.

## 2017-03-13 NOTE — Telephone Encounter (Signed)
No rash side effects I am aware of

## 2017-03-14 NOTE — Telephone Encounter (Signed)
Spoke w/ pt.  Advised her of Dr. Donivan Scull recommendation.  Recommended she see her dermatologist. She states that her sx have been present for some time now and her dermatologist has not been able to find the cause.  Advised her that I do not want her to try to hold her flecainide or Eliquis. She verbalizes understanding and will call back if we can be of further assistance.

## 2017-03-15 ENCOUNTER — Telehealth: Payer: Self-pay

## 2017-03-15 ENCOUNTER — Encounter: Payer: Self-pay | Admitting: Internal Medicine

## 2017-03-15 NOTE — Telephone Encounter (Signed)
-----   Message from Deboraha Sprang, MD sent at 03/15/2017  2:15 PM EDT ----- Can u arrange fu with me end of June early July plz

## 2017-03-15 NOTE — Telephone Encounter (Signed)
l mom to schedule f/u with Dr. Caryl Comes end of June or early July

## 2017-03-15 NOTE — Progress Notes (Signed)
Reviewing notes from [facility] and TG prob favor trying to switch from flec >> Propaf 150 bid to start with and see how she does  She has f/u scheduled 6/20 with [facility] so lets make the change about 6/1

## 2017-03-17 NOTE — Progress Notes (Signed)
Dr. Caryl Comes has made a recommendation to change her flecainide to propafenone 150 mg twice a day This may help her breathing He has suggested making the change 04/08/2017 And she has follow-up with pulmonary several weeks later Would see if she is okay with this change

## 2017-03-25 ENCOUNTER — Encounter: Payer: Self-pay | Admitting: Cardiovascular Disease

## 2017-03-25 ENCOUNTER — Ambulatory Visit (INDEPENDENT_AMBULATORY_CARE_PROVIDER_SITE_OTHER): Payer: Medicare Other | Admitting: Cardiovascular Disease

## 2017-03-25 VITALS — BP 138/60 | HR 86 | Ht 68.0 in | Wt 111.2 lb

## 2017-03-25 DIAGNOSIS — J411 Mucopurulent chronic bronchitis: Secondary | ICD-10-CM

## 2017-03-25 DIAGNOSIS — I272 Pulmonary hypertension, unspecified: Secondary | ICD-10-CM | POA: Diagnosis not present

## 2017-03-25 DIAGNOSIS — I480 Paroxysmal atrial fibrillation: Secondary | ICD-10-CM | POA: Diagnosis not present

## 2017-03-25 DIAGNOSIS — R0602 Shortness of breath: Secondary | ICD-10-CM | POA: Diagnosis not present

## 2017-03-25 DIAGNOSIS — J471 Bronchiectasis with (acute) exacerbation: Secondary | ICD-10-CM

## 2017-03-25 DIAGNOSIS — I951 Orthostatic hypotension: Secondary | ICD-10-CM

## 2017-03-25 MED ORDER — PROPAFENONE HCL 150 MG PO TABS: 150.0000 mg | ORAL_TABLET | Freq: Two times a day (BID) | ORAL | 6 refills | Status: DC

## 2017-03-25 NOTE — Progress Notes (Signed)
Cardiology Office Note  Date:  03/25/2017   ID:  Hannah Kline, DOB 31-Mar-1932, MRN [id number]  PCP:  Crecencio Mc, MD   Chief Complaint  Patient presents with  . other    Patient c/o SOB. Meds reviewed verbally with patient.     HPI:   Hannah Kline is a very pleasant 83year-old woman with a remote history of  Mycobacterium avium with Chronic lung disease/Bronchiectasis diagnosed >17 yr ago, on inhalers, chronic sputum production,  paroxysmal atrial fibrillation,  C. Difficile in 2015 after treatment with Cipro for bronchiectasis Symptoms resolved with Flagyl 2 who presents for followup of her atrial fibrillation.  Last atrial fib with me on her last clinic visit Does not appreciate atrial fibrillation since that time Seen by Dr. Caryl Comes, EP, was normal sinus rhythm at that time Echocardiogram showed normal LV function no left atrial dilatation with moderate to severely elevated right heart pressures greater than 60  Recommended that she start Lasix 20 with potassium 3x per week  On today's visit, no significant improvement in her shortness of breath, still "bad" Having hives couple of weeks, months. On antihistamine with improvement Weight is stable  Case discussed with Dr. Caryl Comes He had recommended changing flecainide to propafenone She has not made this change yet  Significant cough, sputum, using her nebulizer Declined EKG on today's visit  Other past medical history reviewed On her last clinic visit was orthostatic while in atrial fibrillation after taking extra diltiazem  CHADS VASC  reviewed with her, score of 3  Other past medical history Feels herrespiratory status has been slowly getting worse Now on oxygen when she walks, does not feel at she needs this when she is sitting, resting She has a home generator and a portable oxygen generator Sees pulmonary, Dr. Waunita Schooner.  Previous Chest x-ray done which shows stable COPD, pulmonary fibrosis,  bronchiectasis.  In the past she took digoxin, extra flecainide for paroxysmal atrial fibrillation which worked well for her to restore normal sinus rhythm   she has failed sotalol. Amiodarone was discontinued given underlying lung disease. She was started on Flecainide 50 mg twice a day with good success.  Episode of atrial fibrillation on April 27th to the 29th 2014. She came to the office, had an EKG. She took digoxin x2, extra flecainide. This was the longest episode of atrial fibrillation she has had. She is symptomatic in atrial fibrillation. She was also given pradaxa and took his twice a day.   Other Episodes of tachycardia in July 2013, October 2013.    PMH:   has a past medical history of Atrial fibrillation ([place]); Bacterial infection due to Pseudomonas; Bronchiectasis; COPD (chronic obstructive pulmonary disease) (HCC) ( ); Cough; Dysrhythmia; Hepatitis; History of Mycobacterium avium complex infection; Hypothyroidism; Pleurisy (12/08/11); and Shortness of breath dyspnea.  PSH:    Past Surgical History:  Procedure Laterality Date  . CATARACT EXTRACTION W/PHACO Left 01/26/2016   Procedure: CATARACT EXTRACTION PHACO AND INTRAOCULAR LENS PLACEMENT (IOC);  Surgeon: Ronnell Freshwater, MD;  Location: [facility];  Service: Ophthalmology;  Laterality: Left;  . COLONOSCOPY    . SHOULDER ARTHROSCOPY     right    Current Outpatient Prescriptions  Medication Sig Dispense Refill  . apixaban (ELIQUIS) 2.5 MG TABS tablet Take 1 tablet (2.5 mg total) by mouth 2 (two) times daily. 180 tablet 3  . arformoterol (BROVANA) 15 MCG/2ML NEBU Take 2 mLs (15 mcg total) by nebulization 2 (two) times daily. 120 mL 11  .  budesonide (PULMICORT) 0.5 MG/2ML nebulizer solution Take 2 mLs (0.5 mg total) by nebulization 2 (two) times daily. 120 mL 11  . diltiazem (CARDIZEM) 30 MG tablet Take 1 tablet (30 mg total) by mouth 3 (three) times daily as needed (atrial fibrillation). 270 tablet 3   . flecainide (TAMBOCOR) 50 MG tablet Take 1.5 tablets (75 mg total) by mouth 2 (two) times daily. 60 tablet 3  . furosemide (LASIX) 20 MG tablet Take 1 tablet (20 mg total) by mouth as needed for fluid (shortness of breath). 30 tablet 6  . levothyroxine (SYNTHROID, LEVOTHROID) 75 MCG tablet Take 1 tablet (75 mcg total) by mouth daily. 90 tablet 1  . levothyroxine (SYNTHROID, LEVOTHROID) 75 MCG tablet TAKE 1 TABLET (75 MCG TOTAL) BY MOUTH DAILY. 30 tablet 0  . OXYGEN Inhale 2 L into the lungs. [place] - when exercising, walking around, and at night    . potassium chloride (K-DUR) 10 MEQ tablet Take 1 tablet (10 mEq total) by mouth as needed (take with Lasix). 30 tablet 6  . Respiratory Therapy Supplies (FLUTTER) DEVI Use as directed 1 each 0  . sildenafil (REVATIO) 20 MG tablet Take 1 tablet (20 mg total) by mouth 3 (three) times daily. 90 tablet 6   No current facility-administered medications for this visit.      Allergies:   Ciprofloxacin; Isoniazid; and Moxifloxacin   Social History:  The patient  reports that she has never smoked. She has never used smokeless tobacco. She reports that she drinks about 4.2 - 4.8 oz of alcohol per week . She reports that she does not use drugs.   Family History:   family history includes Angina in her mother; Diabetes in her maternal grandfather; Other in her mother.    Review of Systems: Review of Systems  Constitutional: Positive for malaise/fatigue.  Respiratory: Positive for cough and shortness of breath.   Gastrointestinal: Negative.   Musculoskeletal: Negative.   Neurological: Positive for weakness.  Psychiatric/Behavioral: Negative.   All other systems reviewed and are negative.    PHYSICAL EXAM: VS:  BP 138/60 (BP Location: Left Arm, Patient Position: Sitting, Cuff Size: Normal)   Pulse 86   Ht _0  (1.727 m)   Wt 111 lb 4 oz (50.5 kg)   BMI 16.92 kg/m  , BMI Body mass index is 16.92 kg/m.  GEN: thin, no distress  HEENT: normal   Neck: no JVD, carotid bruits, or masses Cardiac, irregularly irregular, no murmurs, rubs, or gallops,no edema  Respiratory:  Rales appreciated on the left, wheezing on the left,  mild increased work of breathing  GI: soft, nontender, nondistended, + BS MS: no deformity or atrophy  Skin: warm and dry, no rash Neuro:  Strength and sensation are intact Psych: euthymic mood, full affect    Recent Labs: 06/14/2016: Magnesium 2.0 06/15/2016: Hemoglobin 12.8; Platelets 212 02/16/2017: ALT 14; BUN 17; Creatinine, Ser 0.76; Potassium 4.0; Sodium 139; TSH 3.18    Lipid Panel Lab Results  Component Value Date   CHOL 177 01/22/2016   HDL 53.50 01/22/2016   LDLCALC 108 (H) 01/22/2016   TRIG 78.0 01/22/2016      Wt Readings from Last 3 Encounters:  03/25/17 111 lb 4 oz (50.5 kg)  02/28/17 111 lb 8 oz (50.6 kg)  02/15/17 112 lb 12.8 oz (51.2 kg)       ASSESSMENT AND PLAN:   Paroxysmal atrial fibrillation (HCC) - Plan: EKG 12-Lead,  Maintaining normal sinus rhythm We will stop flecainide and  start propafenone 150 mill grams twice a day Recommended she take extra diltiazem and digoxin for breakthrough atrial fibrillation  Bronchiectasis with acute exacerbation (HCC) -  Followed by pulmonary, no change on recent CT scan Very coarse breath sounds  Mucopurulent chronic bronchitis (HCC) Recommended if symptoms of cough congestion and sputum get worse that she call pulmonary  Malaise and fatigue - Plan: EKG 12-Lead, Ambulatory referral to Cardiac Electrophysiology Etiology unclear, likely multifactorial from issues above  Pulmonary hypertension Suggested she increase Lasix up to daily with potassium Additional options include starting revatio 20 mg 3 times a day     Total encounter time more than 25 minutes  Greater than 50% was spent in counseling and coordination of care with the patient   Disposition:   F/U  3 month   No orders of the defined types were placed in this  encounter.    Signed, Esmond Plants, M.D., Ph.D. 03/25/2017  [place], [place]

## 2017-03-25 NOTE — Patient Instructions (Addendum)
Medication Instructions:   Please take lasix with potassium daily  Stop flecainide Start propafenone twice a day  If you go into atrial fibrillation, Take diltiazem, also you could take additional propafenone  If no improvement in your breathing  Ask Dr. Pennie Banter about starting sildenifil three times a day  Labwork:  Ask Dr. Jerilynn Mages to do a BMP (looks at kidney and potassium)  Testing/Procedures:  No further testing at this time   I recommend watching educational videos on topics of interest to you at:       [email or web address].goemmi.com  Enter code: HEARTCARE    Follow-Up: It was a pleasure seeing you in the office today. Please call us if you have new issues that need to be addressed before your next appt.  [phone]  Your physician wants you to follow-up in: 6 months.  You will receive a reminder letter in the mail two months in advance. If you don't receive a letter, please call our office to schedule the follow-up appointment.  If you need a refill on your cardiac medications before your next appointment, please call your pharmacy.

## 2017-04-07 ENCOUNTER — Telehealth: Payer: Self-pay | Admitting: *Deleted

## 2017-04-07 DIAGNOSIS — R0602 Shortness of breath: Secondary | ICD-10-CM

## 2017-04-07 NOTE — Telephone Encounter (Signed)
Good thoughts on her. Thanks. What we get the CBC and the bmet  I did not see any significant issues of dyspnea with ELIQUIS.  There are a couple of reports from [place]. I think we should try   serial exclusion   to this end, I would have her go back from propafenone to flecainide initially and see what happens with her breathing for 2 weeks. This will help [place] understand what is related to the propafenone.  After that we should switch the ELIQUIS to something else for a few weeks.

## 2017-04-07 NOTE — Telephone Encounter (Signed)
-----   Message from Juanito Doom, MD sent at 03/14/2017  2:01 PM EDT ----- Glad to hear she is doing well She has her ups and downs and her downs are becoming longer and deeper in the last two years. I'm OK with propfenone if you guys think appropriate  ----- Message ----- From: Minna Merritts, MD Sent: 03/02/2017   9:35 PM To: Deboraha Sprang, MD, Emily Filbert, RN, #  Saw her yesterday, Doing well on flecainide 50 BID No more afib since I last saw her in clinic Etiology of general malaise unclear thx TG  ----- Message ----- From: Deboraha Sprang, MD Sent: 03/01/2017   6:44 PM To: Emily Filbert, RN, Minna Merritts, MD, #  Hannah Kline  If there is any question about the flecainide, what about propafenone is she hasnt tried it

## 2017-04-07 NOTE — Telephone Encounter (Signed)
Per Dr. Caryl Comes- recommendations were to have the patient stop flecainide and start propafenone 225 mg BID on 04/08/17. I called and spoke with the patient today to follow up on her breathing and with medication recommendations.  She states Dr. Rockey Situ had already stopped her flecainide and started her on propafenone 150 mg BID on 03/25/17. She states her breathing is worse than it has been, but she also notes this happened about the time she went on eliquis.  No updated BMP/ CBC noted since eliquis start.  Per the patient, she was also started on sildenafil 20 mg TID 02/28/17 due to her echo results- Dr. Rockey Situ started this.  Per the patient, she is having to use her oxygen more at home- 2L.  I advised her I would review with Dr. Caryl Comes and call her back with any further recommendations. She voices understanding.

## 2017-04-08 MED ORDER — FLECAINIDE ACETATE 100 MG PO TABS: 50.0000 mg | ORAL_TABLET | Freq: Two times a day (BID) | ORAL | Status: DC

## 2017-04-08 NOTE — Telephone Encounter (Signed)
I spoke with the patient regarding Dr. Olin Pia recommendations. She states she did not feel much different with her breathing on profafenone vs flecainide, but the propafenone and eliquis were started about the same time. She is agreeable with coming off propafenone and starting back on flecainide 50 mg BID- she has tried the 100 mg BID dose and could not tolerate this per her report. She will come for a repeat BMP/ CBC on 04/12/17. I advised her we will make the change with the flecainide first, and recheck her lab work prior to making any changes with her blood thinner.  She voices understanding.

## 2017-04-11 ENCOUNTER — Telehealth: Payer: Self-pay | Admitting: Pulmonary Disease

## 2017-04-11 MED ORDER — CEPHALEXIN 500 MG PO TABS: 500.0000 mg | ORAL_TABLET | Freq: Three times a day (TID) | ORAL | 0 refills | Status: DC

## 2017-04-11 MED ORDER — PREDNISONE 10 MG PO TABS: ORAL_TABLET | ORAL | 0 refills | Status: DC

## 2017-04-11 NOTE — Telephone Encounter (Signed)
Spoke with the pt and notified of recs per MR and she verbalized understanding  Rxs were sent to pharm  

## 2017-04-11 NOTE — Telephone Encounter (Signed)
Spoke with patient. She states she has been having problems with SOB for the past week. She feels that she can't breathe unless she uses her O2. She uses 2L at night and has increased to 3L during the day. Increased fatigue. She has been using her Brovana and Pulmicort as prescribed. Productive cough with clear mucus. She wants to know what she should do next.     Pt wishes to use [facility] on [address] in [place]. BQ, please advise. Thanks!

## 2017-04-11 NOTE — Telephone Encounter (Signed)
AE-bronchiectasiss  Plan Please take prednisone 40 mg x1 day, then 30 mg x1 day, then 20 mg x1 day, then 10 mg x1 day, and then 5 mg x1 day and stop  cephalexin 500mg  three times daily x  5 days   Dr. Brand Males, M.D., [facility].C.P Pulmonary and Critical Care Medicine Staff Physician [place] [facility] Pager: [phone], If no answer or between  15:00h - 7:00h: call [phone]  04/11/2017 5:43 PM      Allergies  Allergen Reactions  . Ciprofloxacin Other (See Comments)    c-diff  . Isoniazid     Hepatitis, weakness, nausea  . Moxifloxacin     Avelox  REACTION: chills, fainting

## 2017-04-12 ENCOUNTER — Other Ambulatory Visit (INDEPENDENT_AMBULATORY_CARE_PROVIDER_SITE_OTHER): Payer: Medicare Other

## 2017-04-12 DIAGNOSIS — R0602 Shortness of breath: Secondary | ICD-10-CM

## 2017-04-13 LAB — BASIC METABOLIC PANEL
BUN/Creatinine Ratio: 17 (ref 12–28)
BUN: 14 mg/dL (ref 8–27)
CALCIUM: 9.2 mg/dL (ref 8.7–10.3)
CHLORIDE: 98 mmol/L (ref 96–106)
CO2: 23 mmol/L (ref 18–29)
Creatinine, Ser: 0.84 mg/dL (ref 0.57–1.00)
GFR calc Af Amer: 75 mL/min/{1.73_m2} (ref >59)
GFR calc non Af Amer: 65 mL/min/{1.73_m2} (ref >59)
GLUCOSE: 87 mg/dL (ref 65–99)
Potassium: 4.5 mmol/L (ref 3.5–5.2)
Sodium: 139 mmol/L (ref 134–144)

## 2017-04-13 LAB — CBC WITH DIFFERENTIAL/PLATELET
BASOS ABS: 0.1 10*3/uL (ref 0.0–0.2)
Basos: 1 %
EOS (ABSOLUTE): 0.9 10*3/uL — ABNORMAL HIGH (ref 0.0–0.4)
Eos: 7 %
HEMOGLOBIN: 14.3 g/dL (ref 11.1–15.9)
Hematocrit: 42.6 % (ref 34.0–46.6)
Immature Grans (Abs): 0 10*3/uL (ref 0.0–0.1)
Immature Granulocytes: 0 %
LYMPHS ABS: 1.9 10*3/uL (ref 0.7–3.1)
LYMPHS: 15 %
MCH: 31.4 pg (ref 26.6–33.0)
MCHC: 33.6 g/dL (ref 31.5–35.7)
MCV: 93 fL (ref 79–97)
MONOCYTES: 9 %
Monocytes Absolute: 1.1 10*3/uL — ABNORMAL HIGH (ref 0.1–0.9)
Neutrophils Absolute: 8.6 10*3/uL — ABNORMAL HIGH (ref 1.4–7.0)
Neutrophils: 68 %
Platelets: 305 10*3/uL (ref 150–379)
RBC: 4.56 x10E6/uL (ref 3.77–5.28)
RDW: 14.1 % (ref 12.3–15.4)
WBC: 12.5 10*3/uL — ABNORMAL HIGH (ref 3.4–10.8)

## 2017-04-14 ENCOUNTER — Other Ambulatory Visit: Payer: Self-pay | Admitting: Pulmonary Disease

## 2017-04-14 MED ORDER — ARFORMOTEROL TARTRATE 15 MCG/2ML IN NEBU: 15.0000 ug | INHALATION_SOLUTION | Freq: Two times a day (BID) | RESPIRATORY_TRACT | 11 refills | Status: DC

## 2017-04-14 MED ORDER — BUDESONIDE 0.5 MG/2ML IN SUSP: 0.5000 mg | Freq: Two times a day (BID) | RESPIRATORY_TRACT | 11 refills | Status: DC

## 2017-04-14 NOTE — Telephone Encounter (Signed)
Pt is requesting a refill of her Brovana and Budesonide from [facility]. This has been sent to the  Pharmacy per patient request. Nothing further needed.

## 2017-04-20 ENCOUNTER — Other Ambulatory Visit: Payer: Self-pay | Admitting: Internal Medicine

## 2017-04-20 ENCOUNTER — Telehealth: Payer: Self-pay | Admitting: Internal Medicine

## 2017-04-20 ENCOUNTER — Other Ambulatory Visit: Payer: Self-pay

## 2017-04-20 ENCOUNTER — Ambulatory Visit (INDEPENDENT_AMBULATORY_CARE_PROVIDER_SITE_OTHER): Payer: Medicare Other

## 2017-04-20 VITALS — BP 118/60 | HR 114 | Resp 16

## 2017-04-20 DIAGNOSIS — E039 Hypothyroidism, unspecified: Secondary | ICD-10-CM

## 2017-04-20 DIAGNOSIS — I482 Chronic atrial fibrillation, unspecified: Secondary | ICD-10-CM

## 2017-04-20 DIAGNOSIS — R0602 Shortness of breath: Secondary | ICD-10-CM

## 2017-04-20 MED ORDER — FLECAINIDE ACETATE 50 MG PO TABS: 75.0000 mg | ORAL_TABLET | Freq: Two times a day (BID) | ORAL | Status: DC

## 2017-04-20 NOTE — Telephone Encounter (Signed)
Patient seen for a nurse visit EKG today- Dr. Caryl Comes review and discussed with Dr. Rockey Situ- patient will stop propafenone, start flecainide 75 mg BID, with plans for DCCV next week.

## 2017-04-20 NOTE — Telephone Encounter (Signed)
Reviewed with Dr. Caryl Comes- per MD- recommends an EKG to confirm her rhythm.  She will most likely need to stop propafenone and resume flecainide at 75 mg twice daily. She should also start prednisone as prescribed by pulmonary. I have notified the patient of the above. She is agreeable with coming in for an ekg at 3:00 pm today. I have advised her not to make any med changes until her EKG is done.

## 2017-04-20 NOTE — Patient Instructions (Addendum)
1.) Reason for visit: EKG  2.) Name of MD requesting visit: Dr. Caryl Comes  3.) H&P: Pt with hx of paroxysmal atrial fibrillation. She is taking propafenone   4.) ROS related to problem: Flecainide discontinued and propafenone BID added during May 18 OV with Dr. Rockey Situ. She takes PRN cardizem. Pt reports she has been in afib since Saturday. She has been taking PRN cardizem with last dose last evening. Pt is symptomatic, reporting to be SOB and can feel increased HR.  5.) Assessment and plan per MD: EKG reviewed and signed by Dr. Saunders Revel. Scanned into pt chart for Dr. Olin Pia review.   Dr. Caryl Comes and Dr. Rockey Situ reviewed.   Per Dr. Rockey Situ, have patient stop taking propafenone and start taking flecainide  75mg  BID. Schedule cardioversion for next week. Medication list updated.    Reviewed with patient and spouse in the room. Both verbalized understanding with  no questions at this time.

## 2017-04-20 NOTE — Telephone Encounter (Signed)
Notes recorded by Deboraha Sprang, MD on 04/19/2017 at 9:48 PM EDT H can you call her and see if still is in afib  Thank s ------  Notes recorded by Richmond Campbell, LPN on 9/53/2023 at 3:43 PM EDT PT AWARE OF LAB RESULTS WHILE GIVING VALUES  PT STATES HAS BEEN IN AFIB SINCE LATE SAT HEART RATE IS 120 AFTER WAKING UP FROM [facility]  REAL TIRED WILL LET DR Caryl Comes KNOW ./CY ------    I called the patient to follow up with her regarding her a-fib. She states she is still in a-fib- this started Saturday about 4 pm.  Her rates have been variable- this morning she is 43-104 bpm just speaking with me on the phone.  She is reporting breathlessness, but this has been going on for her for some time. She states she did not go back on flecainide, but is still on propafenone.  She has been taking PRN diltiazem 30 mg 2-3 x/ day since Saturday.  I advised her that I saw in her chart that Dr. Chase Caller had recommended a prednisone taper for her and antibiotic on 04/11/17. She states she never started the prednisone as she did not see the reason for this- she just started the antibiotic today as her WBC count was slightly elevated on labs we drew on her.  She denies a fever.  I advised I will review with Dr. Caryl Comes and call her back.  She is agreeable.

## 2017-04-24 ENCOUNTER — Inpatient Hospital Stay
Admission: EM | Admit: 2017-04-24 | Discharge: 2017-04-26 | DRG: 177 | Disposition: A | Discharge status: Home Health | Payer: Medicare Other | Attending: Internal Medicine | Admitting: Internal Medicine

## 2017-04-24 ENCOUNTER — Emergency Department: Payer: Medicare Other

## 2017-04-24 DIAGNOSIS — Z881 Allergy status to other antibiotic agents status: Secondary | ICD-10-CM | POA: Diagnosis not present

## 2017-04-24 DIAGNOSIS — Z7952 Long term (current) use of systemic steroids: Secondary | ICD-10-CM | POA: Diagnosis not present

## 2017-04-24 DIAGNOSIS — I4891 Unspecified atrial fibrillation: Secondary | ICD-10-CM | POA: Diagnosis not present

## 2017-04-24 DIAGNOSIS — J158 Pneumonia due to other specified bacteria: Secondary | ICD-10-CM | POA: Diagnosis not present

## 2017-04-24 DIAGNOSIS — J470 Bronchiectasis with acute lower respiratory infection: Secondary | ICD-10-CM | POA: Diagnosis present

## 2017-04-24 DIAGNOSIS — J471 Bronchiectasis with (acute) exacerbation: Secondary | ICD-10-CM | POA: Diagnosis not present

## 2017-04-24 DIAGNOSIS — J9621 Acute and chronic respiratory failure with hypoxia: Secondary | ICD-10-CM | POA: Diagnosis present

## 2017-04-24 DIAGNOSIS — J9601 Acute respiratory failure with hypoxia: Secondary | ICD-10-CM | POA: Diagnosis not present

## 2017-04-24 DIAGNOSIS — Y95 Nosocomial condition: Secondary | ICD-10-CM | POA: Diagnosis present

## 2017-04-24 DIAGNOSIS — Z79899 Other long term (current) drug therapy: Secondary | ICD-10-CM | POA: Diagnosis not present

## 2017-04-24 DIAGNOSIS — R0602 Shortness of breath: Secondary | ICD-10-CM | POA: Diagnosis not present

## 2017-04-24 DIAGNOSIS — R0600 Dyspnea, unspecified: Secondary | ICD-10-CM | POA: Diagnosis not present

## 2017-04-24 DIAGNOSIS — M810 Age-related osteoporosis without current pathological fracture: Secondary | ICD-10-CM | POA: Diagnosis present

## 2017-04-24 DIAGNOSIS — J441 Chronic obstructive pulmonary disease with (acute) exacerbation: Secondary | ICD-10-CM | POA: Diagnosis not present

## 2017-04-24 DIAGNOSIS — J440 Chronic obstructive pulmonary disease with acute lower respiratory infection: Secondary | ICD-10-CM | POA: Diagnosis present

## 2017-04-24 DIAGNOSIS — J189 Pneumonia, unspecified organism: Secondary | ICD-10-CM | POA: Diagnosis not present

## 2017-04-24 DIAGNOSIS — I272 Pulmonary hypertension, unspecified: Secondary | ICD-10-CM | POA: Diagnosis present

## 2017-04-24 DIAGNOSIS — I482 Chronic atrial fibrillation: Secondary | ICD-10-CM | POA: Diagnosis present

## 2017-04-24 DIAGNOSIS — Z9981 Dependence on supplemental oxygen: Secondary | ICD-10-CM | POA: Diagnosis not present

## 2017-04-24 DIAGNOSIS — E039 Hypothyroidism, unspecified: Secondary | ICD-10-CM | POA: Diagnosis present

## 2017-04-24 DIAGNOSIS — A310 Pulmonary mycobacterial infection: Secondary | ICD-10-CM | POA: Diagnosis present

## 2017-04-24 DIAGNOSIS — Z7951 Long term (current) use of inhaled steroids: Secondary | ICD-10-CM | POA: Diagnosis not present

## 2017-04-24 DIAGNOSIS — Z888 Allergy status to other drugs, medicaments and biological substances status: Secondary | ICD-10-CM | POA: Diagnosis not present

## 2017-04-24 DIAGNOSIS — J209 Acute bronchitis, unspecified: Secondary | ICD-10-CM | POA: Diagnosis present

## 2017-04-24 LAB — COMPREHENSIVE METABOLIC PANEL
ALK PHOS: 84 U/L (ref 38–126)
ALT: 27 U/L (ref 14–54)
ANION GAP: 6 (ref 5–15)
AST: 26 U/L (ref 15–41)
Albumin: 3.5 g/dL (ref 3.5–5.0)
BILIRUBIN TOTAL: 0.9 mg/dL (ref 0.3–1.2)
BUN: 17 mg/dL (ref 6–20)
CALCIUM: 8.7 mg/dL — AB (ref 8.9–10.3)
CO2: 30 mmol/L (ref 22–32)
Chloride: 101 mmol/L (ref 101–111)
Creatinine, Ser: 0.72 mg/dL (ref 0.44–1.00)
GFR calc non Af Amer: >60 mL/min (ref >60)
Glucose, Bld: 90 mg/dL (ref 65–99)
Potassium: 3.8 mmol/L (ref 3.5–5.1)
Sodium: 137 mmol/L (ref 135–145)
TOTAL PROTEIN: 7.3 g/dL (ref 6.5–8.1)

## 2017-04-24 LAB — EXPECTORATED SPUTUM ASSESSMENT W REFEX TO RESP CULTURE: SPECIAL REQUESTS: NORMAL

## 2017-04-24 LAB — EXPECTORATED SPUTUM ASSESSMENT W GRAM STAIN, RFLX TO RESP C

## 2017-04-24 LAB — CBC
HCT: 40 % (ref 35.0–47.0)
HEMOGLOBIN: 13.5 g/dL (ref 12.0–16.0)
MCH: 31.9 pg (ref 26.0–34.0)
MCHC: 33.8 g/dL (ref 32.0–36.0)
MCV: 94.4 fL (ref 80.0–100.0)
Platelets: 306 10*3/uL (ref 150–440)
RBC: 4.24 MIL/uL (ref 3.80–5.20)
RDW: 14.2 % (ref 11.5–14.5)
WBC: 12.4 10*3/uL — ABNORMAL HIGH (ref 3.6–11.0)

## 2017-04-24 LAB — LACTIC ACID, PLASMA: Lactic Acid, Venous: 0.9 mmol/L (ref 0.5–1.9)

## 2017-04-24 LAB — TROPONIN I

## 2017-04-24 LAB — MRSA PCR SCREENING: MRSA BY PCR: NEGATIVE

## 2017-04-24 MED ORDER — LEVOTHYROXINE SODIUM 50 MCG PO TABS: 75.0000 ug | ORAL_TABLET | Freq: Every day | ORAL | Status: DC

## 2017-04-24 MED ORDER — PIPERACILLIN-TAZOBACTAM 3.375 G IVPB: 3.3750 g | Freq: Three times a day (TID) | INTRAVENOUS | Status: DC

## 2017-04-24 MED ORDER — PIPERACILLIN-TAZOBACTAM 3.375 G IVPB
3.3750 g | Freq: Three times a day (TID) | INTRAVENOUS | Status: DC
  Administered 2017-04-24 – 2017-04-26 (×6): 3.375 g via INTRAVENOUS
  Filled 2017-04-24 (×5): qty 50

## 2017-04-24 MED ORDER — METHYLPREDNISOLONE SODIUM SUCC 125 MG IJ SOLR
60.0000 mg | Freq: Four times a day (QID) | INTRAMUSCULAR | Status: DC
  Administered 2017-04-24 – 2017-04-25 (×3): 60 mg via INTRAVENOUS
  Filled 2017-04-24 (×3): qty 2

## 2017-04-24 MED ORDER — BUDESONIDE 0.5 MG/2ML IN SUSP
0.5000 mg | Freq: Two times a day (BID) | RESPIRATORY_TRACT | Status: DC
  Administered 2017-04-24 – 2017-04-26 (×4): 0.5 mg via RESPIRATORY_TRACT
  Filled 2017-04-24 (×5): qty 2

## 2017-04-24 MED ORDER — ARFORMOTEROL TARTRATE 15 MCG/2ML IN NEBU
15.0000 ug | INHALATION_SOLUTION | Freq: Two times a day (BID) | RESPIRATORY_TRACT | Status: DC
Start: 2017-04-24 — End: 2017-04-26
  Administered 2017-04-24 – 2017-04-26 (×4): 15 ug via RESPIRATORY_TRACT
  Filled 2017-04-24 (×6): qty 2

## 2017-04-24 MED ORDER — IPRATROPIUM-ALBUTEROL 0.5-2.5 (3) MG/3ML IN SOLN
3.0000 mL | Freq: Once | RESPIRATORY_TRACT | Status: AC
  Administered 2017-04-24: 3 mL via RESPIRATORY_TRACT
  Filled 2017-04-24: qty 3

## 2017-04-24 MED ORDER — APIXABAN 2.5 MG PO TABS
2.5000 mg | ORAL_TABLET | Freq: Two times a day (BID) | ORAL | Status: DC
  Administered 2017-04-24 – 2017-04-26 (×4): 2.5 mg via ORAL
  Filled 2017-04-24 (×4): qty 1

## 2017-04-24 MED ORDER — SILDENAFIL CITRATE 20 MG PO TABS
20.0000 mg | ORAL_TABLET | Freq: Three times a day (TID) | ORAL | Status: DC
  Administered 2017-04-24 – 2017-04-26 (×6): 20 mg via ORAL
  Filled 2017-04-24 (×11): qty 1

## 2017-04-24 MED ORDER — FUROSEMIDE 20 MG PO TABS: 20.0000 mg | ORAL_TABLET | Freq: Every day | ORAL | Status: DC | PRN

## 2017-04-24 MED ORDER — DILTIAZEM HCL 30 MG PO TABS: 30.0000 mg | ORAL_TABLET | Freq: Three times a day (TID) | ORAL | Status: DC | PRN

## 2017-04-24 MED ORDER — VANCOMYCIN HCL IN DEXTROSE 1-5 GM/200ML-% IV SOLN
1000.0000 mg | Freq: Once | INTRAVENOUS | Status: AC
  Administered 2017-04-24: 1000 mg via INTRAVENOUS
  Filled 2017-04-24: qty 200

## 2017-04-24 MED ORDER — LEVOTHYROXINE SODIUM 50 MCG PO TABS
75.0000 ug | ORAL_TABLET | ORAL | Status: DC
  Administered 2017-04-25 – 2017-04-26 (×2): 75 ug via ORAL
  Filled 2017-04-24 (×3): qty 1

## 2017-04-24 MED ORDER — VANCOMYCIN HCL IN DEXTROSE 750-5 MG/150ML-% IV SOLN
750.0000 mg | INTRAVENOUS | Status: DC
  Administered 2017-04-25: 750 mg via INTRAVENOUS
  Filled 2017-04-24: qty 150

## 2017-04-24 MED ORDER — ALBUTEROL SULFATE (2.5 MG/3ML) 0.083% IN NEBU
5.0000 mg | INHALATION_SOLUTION | Freq: Once | RESPIRATORY_TRACT | Status: AC
  Administered 2017-04-24: 5 mg via RESPIRATORY_TRACT
  Filled 2017-04-24: qty 6

## 2017-04-24 MED ORDER — IPRATROPIUM-ALBUTEROL 0.5-2.5 (3) MG/3ML IN SOLN
3.0000 mL | Freq: Four times a day (QID) | RESPIRATORY_TRACT | Status: DC
  Administered 2017-04-24 – 2017-04-26 (×7): 3 mL via RESPIRATORY_TRACT
  Filled 2017-04-24 (×8): qty 3

## 2017-04-24 MED ORDER — ALBUTEROL SULFATE (2.5 MG/3ML) 0.083% IN NEBU: 2.5000 mg | INHALATION_SOLUTION | RESPIRATORY_TRACT | Status: DC | PRN

## 2017-04-24 MED ORDER — POTASSIUM CHLORIDE ER 10 MEQ PO TBCR
10.0000 meq | EXTENDED_RELEASE_TABLET | Freq: Every day | ORAL | Status: DC | PRN
  Filled 2017-04-24: qty 1

## 2017-04-24 MED ORDER — ORAL CARE MOUTH RINSE
15.0000 mL | Freq: Two times a day (BID) | OROMUCOSAL | Status: DC
  Administered 2017-04-24 – 2017-04-26 (×2): 15 mL via OROMUCOSAL

## 2017-04-24 MED ORDER — FLECAINIDE ACETATE 50 MG PO TABS
75.0000 mg | ORAL_TABLET | Freq: Two times a day (BID) | ORAL | Status: DC
  Administered 2017-04-24 – 2017-04-26 (×4): 75 mg via ORAL
  Filled 2017-04-24 (×5): qty 2

## 2017-04-24 MED ORDER — METHYLPREDNISOLONE SODIUM SUCC 125 MG IJ SOLR
125.0000 mg | INTRAMUSCULAR | Status: AC
  Administered 2017-04-24: 125 mg via INTRAVENOUS
  Filled 2017-04-24: qty 2

## 2017-04-24 MED ORDER — AZITHROMYCIN 500 MG PO TABS
500.0000 mg | ORAL_TABLET | Freq: Once | ORAL | Status: AC
  Administered 2017-04-24: 500 mg via ORAL
  Filled 2017-04-24: qty 1

## 2017-04-24 NOTE — Progress Notes (Signed)
Family Meeting Note  Advance Directive:yes  Today a meeting took place with the Patient.     The following clinical team members were present during this meeting:MD  The following were discussed:Patient's diagnosis , treatment plan of care discussed in detail with the patient. She verbalized understanding of the plan Patient's progosis: Unable to determine and Goals for treatment: Full Code, husband is the healthcare power of attorney  Additional follow-up to be provided: MD , pulmonology  Time spent during discussion:16 MIN  Hannah Mango, MD

## 2017-04-24 NOTE — ED Triage Notes (Signed)
Per EMS pt comes from [facility].  Pt states she had a hard time breathing as of last night.  Pt presents anxious with labored breathing and bilateral wheezing. Pt is normally on 3L O2 at home.  Pt states she is in Afib and is to have a cardioversion in the next week.

## 2017-04-24 NOTE — ED Provider Notes (Signed)
[facility] Emergency Department Provider Note   ____________________________________________   First MD Initiated Contact with Patient 04/24/17 [phone]     (approximate)  I have reviewed the triage vital signs and the nursing notes.   HISTORY  Chief Complaint Shortness of Breath  HPI Hannah Kline is a 83 y.o. female history of bronchiectasis and paroxysmal A. fib which is anticoagulated  Patient reports for about a week now she is been having symptoms where she feels like she is going in and out of atrial fibrillation with heart rates ranging from 60-100. In addition she's had an increasing cough and shortness of breathand increased sputum production recently. She started taking cephalexin a few days ago for increased cough and sputum production but reports her symptoms continue to worsen  She felt increased shortness of breath last evening, and is persisted through today. Ports worsening  Does occasionally use oxygen at home, only as needed but has had to use a quite bit more in the last couple of days according to patient and her husband   Past Medical History:  Diagnosis Date  . Atrial fibrillation ([place])   . Bacterial infection due to Pseudomonas   . Bronchiectasis   . COPD (chronic obstructive pulmonary disease) ([place])    . Cough    from bronchiectasis neb txs  . Dysrhythmia    A Fib  . Hepatitis    h/o INH(isoniazide)  . History of Mycobacterium avium complex infection    lung disease  . Hypothyroidism   . Pleurisy 12/08/11  . Shortness of breath dyspnea     Patient Active Problem List   Diagnosis Date Noted  . Shortness of breath 03/25/2017  . Pulmonary hypertension ([place]) 02/28/2017  . Paroxysmal A-fib ([place]) 06/14/2016  . Weakness generalized 05/27/2016  . Excessive cerumen in both ear canals 02/11/2016  . Malaise and fatigue 11/24/2014  . Glucose intolerance (pre-diabetes) 11/24/2014  . Acquired hypothyroidism 09/02/2014  . Anorexia  07/14/2014  . Protein-calorie malnutrition, mild ([place]) 07/14/2014  . Abnormal mammogram 02/19/2014  . Osteoporosis 02/14/2014  . Visit for preventive health examination 01/08/2014  . Colitis, Clostridium difficile 11/23/2013  . Bronchiectasis with acute exacerbation ([employer]) 10/24/2013  . Rhinitis, nonallergic 10/10/2012  . Orthostatic hypotension 09/22/2012  . Hypoxemia requiring supplemental oxygen 02/21/2012  . Chronic sinusitis 01/03/2012  . Pulmonary nodule 01/03/2012  . Screening for colon cancer 10/05/2011  . Screening for breast cancer 10/05/2011  . Bacterial infection due to Pseudomonas   . Atrial fibrillation ([place]) 09/15/2010  . BRONCHIECTASIS 09/15/2010  . Chronic bronchitis ([place]) 09/15/2010    Past Surgical History:  Procedure Laterality Date  . CATARACT EXTRACTION W/PHACO Left 01/26/2016   Procedure: CATARACT EXTRACTION PHACO AND INTRAOCULAR LENS PLACEMENT (IOC);  Surgeon: Ronnell Freshwater, MD;  Location: [place];  Service: Ophthalmology;  Laterality: Left;  . COLONOSCOPY    . SHOULDER ARTHROSCOPY     right    Prior to Admission medications   Medication Sig Start Date End Date Taking? Authorizing Provider  apixaban (ELIQUIS) 2.5 MG TABS tablet Take 1 tablet (2.5 mg total) by mouth 2 (two) times daily. 01/03/17   Minna Merritts, MD  arformoterol (BROVANA) 15 MCG/2ML NEBU Take 2 mLs (15 mcg total) by nebulization 2 (two) times daily. 04/14/17   Juanito Doom, MD  budesonide (PULMICORT) 0.5 MG/2ML nebulizer solution Take 2 mLs (0.5 mg total) by nebulization 2 (two) times daily. 04/14/17   Juanito Doom, MD  Cephalexin 500 MG tablet  Take 1 tablet (500 mg total) by mouth 3 (three) times daily. Patient not taking: Reported on 04/20/2017 04/11/17   Brand Males, MD  diltiazem (CARDIZEM) 30 MG tablet Take 1 tablet (30 mg total) by mouth 3 (three) times daily as needed (atrial fibrillation). 01/03/17   Minna Merritts, MD  flecainide (TAMBOCOR) 50  MG tablet Take 1.5 tablets (75 mg total) by mouth 2 (two) times daily. 04/20/17   Deboraha Sprang, MD  furosemide (LASIX) 20 MG tablet Take 1 tablet (20 mg total) by mouth as needed for fluid (shortness of breath). 02/28/17 05/29/17  Minna Merritts, MD  levothyroxine (SYNTHROID, LEVOTHROID) 75 MCG tablet Take 1 tablet (75 mcg total) by mouth daily. 02/19/17   Crecencio Mc, MD  levothyroxine (SYNTHROID, LEVOTHROID) 75 MCG tablet TAKE 1 TABLET (75 MCG TOTAL) BY MOUTH DAILY. 04/20/17   Crecencio Mc, MD  OXYGEN Inhale 2 L into the lungs. [place] - when exercising, walking around, and at night    [provider]  potassium chloride (K-DUR) 10 MEQ tablet Take 1 tablet (10 mEq total) by mouth as needed (take with Lasix). 02/28/17 05/29/17  Minna Merritts, MD  predniSONE (DELTASONE) 10 MG tablet 4 x 1 day, 3 x 1 day, 2 x 1, 1 x 1, 0.5 x 1 day then stop 04/11/17   Brand Males, MD  Respiratory Therapy Supplies (FLUTTER) DEVI Use as directed 07/21/15   Tanda Rockers, MD  sildenafil (REVATIO) 20 MG tablet Take 1 tablet (20 mg total) by mouth 3 (three) times daily. 02/28/17   Minna Merritts, MD    Allergies Ciprofloxacin; Isoniazid; and Moxifloxacin  Family History  Problem Relation Age of Onset  . Angina Mother   . Other Mother        intestional blockage  . Diabetes Maternal Grandfather     Social History Social History  Substance Use Topics  . Smoking status: Never Smoker  . Smokeless tobacco: Never Used  . Alcohol use 4.2 - 4.8 oz/week    7 Glasses of wine per week    Review of Systems Constitutional: No fever/chills Eyes: No visual changes. ENT: No sore throat. Cardiovascular: Denies chest pain. Respiratory: See history of present illness Gastrointestinal: No abdominal pain.  No nausea, no vomiting.  No diarrhea.  No constipation. Genitourinary: Negative for dysuria. Musculoskeletal: Negative for back pain. Skin: Negative for rash. Neurological: Negative for headaches,  focal weakness or numbness.    ____________________________________________   PHYSICAL EXAM:  VITAL SIGNS: ED Triage Vitals [04/24/17 0858]  Enc Vitals Group     BP      Pulse      Resp      Temp      Temp src      SpO2      Weight 110 lb (49.9 kg)     Height 5' 8"  (1.727 m)     Head Circumference      Peak Flow      Pain Score      Pain Loc      Pain Edu?      Excl. in [place]?     Constitutional: Alert and oriented. Moderately ill-appearing, sitting upright in the bed. Appears dyspneic Eyes: Conjunctivae are normal. Head: Atraumatic. Nose: No congestion/rhinnorhea. Mouth/Throat: Mucous membranes are moist. Neck: No stridor.   Cardiovascular: Normal rate, regular rhythm. Grossly normal heart sounds.  Good peripheral circulation. Respiratory: Moderate increased rate of breathing with accessory muscle use excision short phrases.  She is on oxygen. She has somewhat coarse lung sounds with rhonchi throughout, perhaps more prominent in the left lower regions. No obvious wheezing, but expiratory phase does seem somewhat prolonged Gastrointestinal: Soft and nontender. No distention. Musculoskeletal: No lower extremity tenderness nor edema. Neurologic:  Normal speech and language. No gross focal neurologic deficits are appreciated.  Skin:  Skin is warm, dry and intact. No rash noted. Psychiatric: Mood and affect are normal. Speech and behavior are normal.  ____________________________________________   LABS (all labs ordered are listed, but only abnormal results are displayed)  Labs Reviewed  COMPREHENSIVE METABOLIC PANEL - Abnormal; Notable for the following:       Result Value   Calcium 8.7 (*)    All other components within normal limits  CBC - Abnormal; Notable for the following:    WBC 12.4 (*)    All other components within normal limits  CULTURE, EXPECTORATED SPUTUM-ASSESSMENT  CULTURE, BLOOD (ROUTINE X 2)  CULTURE, BLOOD (ROUTINE X 2)  CULTURE, RESPIRATORY  (NON-EXPECTORATED)  TROPONIN I  LACTIC ACID, PLASMA  LACTIC ACID, PLASMA   ____________________________________________  EKG  Reviewed and interpreted by me at 9:05 AM Heart rate 85 Triage 110 QTc 480 Normal sinus rhythm, left ventricular hypertrophy, minimal likely J-point elevation noted inferiorly, but does not appear consistent with an acutely ischemic morphology. I do not see evidence of STEMI ____________________________________________  RADIOLOGY  Dg Chest Port 1 View  Result Date: 04/24/2017 CLINICAL DATA:  83 year old female with known chronic lung infection and difficulty breathing EXAM: PORTABLE CHEST 1 VIEW COMPARISON:  CT 02/17/2017, plain film 06/14/2016 FINDINGS: Cardiomediastinal silhouette unchanged in size and contour. Heart borders partially obscured by overlying lung and pleural disease. Hyperinflation of the lungs with flattened hemidiaphragms on the AP view. Mixed interstitial and airspace opacities in a similar distribution to the comparison chest x-ray and CT study, including focal opacities of the bilateral apices, right suprahilar and upper lung, projecting over the left heart border, and the bilateral bases. Background reticular opacity throughout the lungs. No new confluent airspace disease identified. No large pleural effusion. No pneumothorax. IMPRESSION: Similar appearance of chronic lung changes, architectural distortion, and nodular opacities in this patient with known chronic lung infection/MAC. Superimposed acute pneumonia difficult to exclude and correlation with lab values may be useful. Electronically Signed   By: Corrie Mckusick D.O.   On: 04/24/2017 09:39    ____________________________________________   PROCEDURES  Procedure(s) performed: None  Procedures  Critical Care performed: No  ____________________________________________   INITIAL IMPRESSION / ASSESSMENT AND PLAN / ED COURSE  Pertinent labs & imaging results that were available  during my care of the patient were reviewed by me and considered in my medical decision making (see chart for details).    Clinical Course as of Apr 24 1041  Sun Apr 24, 2017  0944 [facility] pulmonary on-call paged to review history, labs, cxr and possible need for abx change  [MQ]    Clinical Course User Index [MQ] Delman Kitten, MD   ----------------------------------------- 10:43 AM on 04/24/2017 -----------------------------------------  Patient reports improvement. Still sitting upright, mild ongoing accessory muscle use, speaks only in a few words and short phrases. He does appear slightly improved, but is obviously still somewhat dyspneic despite treatment with steroids, nebs, and I spoke with pulmonary doctor Felicie Morn and he advises starting azithromycin by mouth. At this juncture, I spoke with the patient and given her increased work of breathing and slow response to treatment I suggested and recommended didn't observation  for further workup via the hospitalist service. The patient and her husband are agreeable. She continues to demonstrate mild tachypnea, though her heart rate is much improved.  No chest pain. No pleuritic symptoms. She is oriented to coagulate and I think this makes the risk of pulmonary wasn't very low, I do not believe she warrants CT imaging at this time  ____________________________________________   FINAL CLINICAL IMPRESSION(S) / ED DIAGNOSES  Final diagnoses:  Bronchiectasis with acute exacerbation (HCC)  COPD exacerbation ([place])  Shortness of breath      NEW MEDICATIONS STARTED DURING THIS VISIT:  New Prescriptions   No medications on file     Note:  This document was prepared using Dragon voice recognition software and may include unintentional dictation errors.     Delman Kitten, MD 04/24/17 1044

## 2017-04-24 NOTE — H&P (Signed)
[facility] [place] NAME: Naidelin Gugliotta    MR#:  [id number]  DATE OF BIRTH:  Dec 06, 1931  DATE OF ADMISSION:  04/24/2017  PRIMARY CARE PHYSICIAN: Crecencio Mc, MD   REQUESTING/REFERRING PHYSICIAN:   CHIEF COMPLAINT:  Shortness of breath  HISTORY OF PRESENT ILLNESS:  Misheel Gowans  is a 83 y.o. female with a known history of COPD, chronic bronchiectasis, chronic Mycobacterium avium complex infection, chronic atrial fibrillation, hypogonadism and multiple other medical problems is presenting to the ED with a chief complaint of worsening of shortness of breath and productive cough. Patient follows with pulmonology Dr. Lake Bells as an outpatient. Patient was seen by primary care physician who started her on Keflex with no improvement. Patient is requiring continuous 2 L of oxygen at home, she usually uses oxygen at bedtime only. Chest x-ray revealed superimposed pneumonia difficult to rule out  PAST MEDICAL HISTORY:   Past Medical History:  Diagnosis Date  . Atrial fibrillation ([place])   . Bacterial infection due to Pseudomonas   . Bronchiectasis   . COPD (chronic obstructive pulmonary disease) ([place])    . Cough    from bronchiectasis neb txs  . Dysrhythmia    A Fib  . Hepatitis    h/o INH(isoniazide)  . History of Mycobacterium avium complex infection    lung disease  . Hypothyroidism   . Pleurisy 12/08/11  . Shortness of breath dyspnea     PAST SURGICAL HISTOIRY:   Past Surgical History:  Procedure Laterality Date  . CATARACT EXTRACTION W/PHACO Left 01/26/2016   Procedure: CATARACT EXTRACTION PHACO AND INTRAOCULAR LENS PLACEMENT (IOC);  Surgeon: Ronnell Freshwater, MD;  Location: [facility];  Service: Ophthalmology;  Laterality: Left;  . COLONOSCOPY    . SHOULDER ARTHROSCOPY     right    SOCIAL HISTORY:   Social History  Substance Use Topics  . Smoking status: Never Smoker  . Smokeless tobacco: Never Used   . Alcohol use 4.2 - 4.8 oz/week    7 Glasses of wine per week    FAMILY HISTORY:   Family History  Problem Relation Age of Onset  . Angina Mother   . Other Mother        intestional blockage  . Diabetes Maternal Grandfather     DRUG ALLERGIES:   Allergies  Allergen Reactions  . Ciprofloxacin Other (See Comments)    c-diff  . Isoniazid     Hepatitis, weakness, nausea  . Moxifloxacin     Avelox  REACTION: chills, fainting    REVIEW OF SYSTEMS:  CONSTITUTIONAL: No fever,Reporting fatigue or weakness.  EYES: No blurred or double vision.  EARS, NOSE, AND THROAT: No tinnitus or ear pain.  RESPIRATORY: Patient has productive cough, shortness of breath, wheezing ; denies hemoptysis.  CARDIOVASCULAR: no chest pain, orthopnea, edema.  GASTROINTESTINAL: No nausea, vomiting, diarrhea or abdominal pain.  GENITOURINARY: No dysuria, hematuria.  ENDOCRINE: No polyuria, nocturia,  HEMATOLOGY: No anemia, easy bruising or bleeding SKIN: No rash or lesion. MUSCULOSKELETAL: No joint pain or arthritis.   NEUROLOGIC: No tingling, numbness, weakness.  PSYCHIATRY: No anxiety or depression.   MEDICATIONS AT HOME:   Prior to Admission medications   Medication Sig Start Date End Date Taking? Authorizing Provider  apixaban (ELIQUIS) 2.5 MG TABS tablet Take 1 tablet (2.5 mg total) by mouth 2 (two) times daily. 01/03/17   Minna Merritts, MD  arformoterol (BROVANA) 15 MCG/2ML NEBU Take 2 mLs (15 mcg  total) by nebulization 2 (two) times daily. 04/14/17   Juanito Doom, MD  budesonide (PULMICORT) 0.5 MG/2ML nebulizer solution Take 2 mLs (0.5 mg total) by nebulization 2 (two) times daily. 04/14/17   Juanito Doom, MD  Cephalexin 500 MG tablet Take 1 tablet (500 mg total) by mouth 3 (three) times daily. Patient not taking: Reported on 04/20/2017 04/11/17   Brand Males, MD  diltiazem (CARDIZEM) 30 MG tablet Take 1 tablet (30 mg total) by mouth 3 (three) times daily as needed (atrial  fibrillation). 01/03/17   Minna Merritts, MD  flecainide (TAMBOCOR) 50 MG tablet Take 1.5 tablets (75 mg total) by mouth 2 (two) times daily. 04/20/17   Deboraha Sprang, MD  furosemide (LASIX) 20 MG tablet Take 1 tablet (20 mg total) by mouth as needed for fluid (shortness of breath). 02/28/17 05/29/17  Minna Merritts, MD  levothyroxine (SYNTHROID, LEVOTHROID) 75 MCG tablet Take 1 tablet (75 mcg total) by mouth daily. 02/19/17   Crecencio Mc, MD  levothyroxine (SYNTHROID, LEVOTHROID) 75 MCG tablet TAKE 1 TABLET (75 MCG TOTAL) BY MOUTH DAILY. 04/20/17   Crecencio Mc, MD  OXYGEN Inhale 2 L into the lungs. [place] - when exercising, walking around, and at night    [provider]  potassium chloride (K-DUR) 10 MEQ tablet Take 1 tablet (10 mEq total) by mouth as needed (take with Lasix). 02/28/17 05/29/17  Minna Merritts, MD  predniSONE (DELTASONE) 10 MG tablet 4 x 1 day, 3 x 1 day, 2 x 1, 1 x 1, 0.5 x 1 day then stop 04/11/17   Brand Males, MD  Respiratory Therapy Supplies (FLUTTER) DEVI Use as directed 07/21/15   Tanda Rockers, MD  sildenafil (REVATIO) 20 MG tablet Take 1 tablet (20 mg total) by mouth 3 (three) times daily. 02/28/17   Minna Merritts, MD      VITAL SIGNS:  Blood pressure 117/64, pulse 83, resp. rate (!) 29, height _0  (1.727 m), weight 49.9 kg (110 lb), SpO2 95 %.  PHYSICAL EXAMINATION:  GENERAL:  83 y.o.-year-old patient lying in the bed with no acute distress.  EYES: Pupils equal, round, reactive to light and accommodation. No scleral icterus. Extraocular muscles intact.  HEENT: Head atraumatic, normocephalic. Oropharynx and nasopharynx clear.  NECK:  Supple, no jugular venous distention. No thyroid enlargement, no tenderness.  LUNGS: Moderately diminished coarse bronchial breath sounds bilaterally, mild wheezing, no rales,rhonchi ; positive crepitation. No use of accessory muscles of respiration.  CARDIOVASCULAR: S1, S2 normal. No murmurs, rubs, or gallops.   ABDOMEN: Soft, nontender, nondistended. Bowel sounds present. No organomegaly or mass.  EXTREMITIES: No pedal edema, cyanosis, or clubbing.  NEUROLOGIC: Cranial nerves II through XII are intact. Muscle strength 5/5 in all extremities. Sensation intact. Gait not checked.  PSYCHIATRIC: The patient is alert and oriented x 3.  SKIN: No obvious rash, lesion, or ulcer.   LABORATORY PANEL:   CBC  Recent Labs Lab 04/24/17 0912  WBC 12.4*  HGB 13.5  HCT 40.0  PLT 306   ------------------------------------------------------------------------------------------------------------------  Chemistries   Recent Labs Lab 04/24/17 0912  NA 137  K 3.8  CL 101  CO2 30  GLUCOSE 90  BUN 17  CREATININE 0.72  CALCIUM 8.7*  AST 26  ALT 27  ALKPHOS 84  BILITOT 0.9   ------------------------------------------------------------------------------------------------------------------  Cardiac Enzymes  Recent Labs Lab 04/24/17 0912  TROPONINI <0.03   ------------------------------------------------------------------------------------------------------------------  RADIOLOGY:  Dg Chest Port 1 View  Result Date:  04/24/2017 CLINICAL DATA:  83 year old female with known chronic lung infection and difficulty breathing EXAM: PORTABLE CHEST 1 VIEW COMPARISON:  CT 02/17/2017, plain film 06/14/2016 FINDINGS: Cardiomediastinal silhouette unchanged in size and contour. Heart borders partially obscured by overlying lung and pleural disease. Hyperinflation of the lungs with flattened hemidiaphragms on the AP view. Mixed interstitial and airspace opacities in a similar distribution to the comparison chest x-ray and CT study, including focal opacities of the bilateral apices, right suprahilar and upper lung, projecting over the left heart border, and the bilateral bases. Background reticular opacity throughout the lungs. No new confluent airspace disease identified. No large pleural effusion. No pneumothorax.  IMPRESSION: Similar appearance of chronic lung changes, architectural distortion, and nodular opacities in this patient with known chronic lung infection/MAC. Superimposed acute pneumonia difficult to exclude and correlation with lab values may be useful. Electronically Signed   By: Corrie Mckusick D.O.   On: 04/24/2017 09:39    EKG:   Orders placed or performed during the hospital encounter of 04/24/17  . ED EKG  . ED EKG    IMPRESSION AND PLAN:  Ivon Roedel  is a 83 y.o. female with a known history of COPD, chronic bronchiectasis, chronic Mycobacterium avium complex infection, chronic atrial fibrillation, hypogonadism and multiple other medical problems is presenting to the ED with a chief complaint of worsening of shortness of breath and productive cough.  #Acute hypoxic respiratory failure secondary to COPD exacerbation and healthcare associated pneumonia Admit to MedSurg floor Continue oxygen via nasal cannula and wean off as tolerated. Patient lives on 2 L of oxygen daily at bedtime Pulmonology consult  #Acute COPD exacerbation with bronchiectasis and chronic Mycobacterium avium complex Will get sputum culture and sensitivity IV Solu-Medrol Nebulizer treatments Pulmonology consult  #Healthcare associated pneumonia with chronic Mycobacterium avium complex Patient failed outpatient antibiotic therapy with Keflex We'll get sputum culture and sensitivity Zosyn and vancomycin supportive treatments  #Chronic atrial fibrillation currently rate controlled Continue home medication Cardizem ,FLECAINIDE and eliquis  #Hypothyroidism continue Synthroid  DVT prophylaxis with eliquis    All the records are reviewed and case discussed with ED provider. Management plans discussed with the patient, She is in agreement.  CODE STATUS: Full code, husband is the healthcare power of attorney  TOTAL TIME TAKING CARE OF THIS PATIENT: 45 minutes.   Note: This dictation was prepared with  Dragon dictation along with smaller phrase technology. Any transcriptional errors that result from this process are unintentional.  Nicholes Mango M.D on 04/24/2017 at 11:09 AM  Between 7am to 6pm - Pager - [phone]  After 6pm go to [email or web address] - password [email or web address] [facility]  Office  [phone]  CC: Primary care physician; Crecencio Mc, MD

## 2017-04-24 NOTE — Progress Notes (Signed)
Pharmacy Antibiotic Note  Hannah Kline is a 83 y.o. female admitted on 04/24/2017 with  pneumonia/HCAP.  Pharmacy has been consulted for Zosyn and Vancomycin dosing.  Plan: Ke: 0.040   VD: 34.9   T1/2: 17.33    Will give vancomycin 1g x 1, followed by vancomycin 750mg  IV every 24 hours with 14 hour stack dosing. Calculated trough at Css is 16. Trough level prior to 4th dose. Recommend MRSA PCR- if negative, recommend discontinuing vancomycin.   Will order Zosyn 3.375 IV EI every 8 hours.   Height: 5\' 8"  (172.7 cm) Weight: 110 lb (49.9 kg) IBW/kg (Calculated) : 63.9  Temp (24hrs), Avg:98.2 F (36.8 C), Min:98.2 F (36.8 C), Max:98.2 F (36.8 C)   Recent Labs Lab 04/24/17 0912 04/24/17 0925  WBC 12.4*  --   CREATININE 0.72  --   LATICACIDVEN  --  0.9    Estimated Creatinine Clearance: 42.7 mL/min (by C-G formula based on SCr of 0.72 mg/dL).    Allergies  Allergen Reactions  . Ciprofloxacin Other (See Comments)    c-diff  . Isoniazid     Hepatitis, weakness, nausea  . Moxifloxacin     Avelox  REACTION: chills, fainting    Antimicrobials this admission: 6/17 vancomycin >>  6/17 Zosyn  >>   Dose adjustments this admission:  Microbiology results: 6/17 BCx: sent 6/17 Sputum/Resp Cx: pending 6/17  MRSA PCR: pending  Thank you for allowing pharmacy to be a part of this patient's care.  Pernell Dupre, PharmD, BCPS Clinical Pharmacist 04/24/2017 11:59 AM

## 2017-04-25 LAB — COMPREHENSIVE METABOLIC PANEL
ALBUMIN: 3 g/dL — AB (ref 3.5–5.0)
ALK PHOS: 66 U/L (ref 38–126)
ALT: 21 U/L (ref 14–54)
ANION GAP: 5 (ref 5–15)
AST: 19 U/L (ref 15–41)
BILIRUBIN TOTAL: 0.6 mg/dL (ref 0.3–1.2)
BUN: 16 mg/dL (ref 6–20)
CALCIUM: 8.3 mg/dL — AB (ref 8.9–10.3)
CO2: 30 mmol/L (ref 22–32)
CREATININE: 0.7 mg/dL (ref 0.44–1.00)
Chloride: 101 mmol/L (ref 101–111)
GFR calc Af Amer: >60 mL/min (ref >60)
GFR calc non Af Amer: >60 mL/min (ref >60)
GLUCOSE: 134 mg/dL — AB (ref 65–99)
Potassium: 4.1 mmol/L (ref 3.5–5.1)
SODIUM: 136 mmol/L (ref 135–145)
TOTAL PROTEIN: 6.2 g/dL — AB (ref 6.5–8.1)

## 2017-04-25 LAB — CBC
HEMATOCRIT: 34.7 % — AB (ref 35.0–47.0)
HEMOGLOBIN: 11.4 g/dL — AB (ref 12.0–16.0)
MCH: 30.8 pg (ref 26.0–34.0)
MCHC: 32.8 g/dL (ref 32.0–36.0)
MCV: 93.9 fL (ref 80.0–100.0)
Platelets: 276 10*3/uL (ref 150–440)
RBC: 3.69 MIL/uL — AB (ref 3.80–5.20)
RDW: 14.3 % (ref 11.5–14.5)
WBC: 15.6 10*3/uL — ABNORMAL HIGH (ref 3.6–11.0)

## 2017-04-25 MED ORDER — PREDNISONE 50 MG PO TABS
50.0000 mg | ORAL_TABLET | Freq: Every day | ORAL | Status: DC
  Administered 2017-04-26: 50 mg via ORAL
  Filled 2017-04-25: qty 1

## 2017-04-25 MED ORDER — ADULT MULTIVITAMIN W/MINERALS CH
1.0000 | ORAL_TABLET | Freq: Every day | ORAL | Status: DC
  Administered 2017-04-25 – 2017-04-26 (×2): 1 via ORAL
  Filled 2017-04-25 (×2): qty 1

## 2017-04-25 MED ORDER — METHYLPREDNISOLONE SODIUM SUCC 40 MG IJ SOLR: 40.0000 mg | Freq: Four times a day (QID) | INTRAMUSCULAR | Status: DC

## 2017-04-25 MED ORDER — ENSURE ENLIVE PO LIQD
237.0000 mL | Freq: Two times a day (BID) | ORAL | Status: DC
  Administered 2017-04-25 – 2017-04-26 (×2): 237 mL via ORAL

## 2017-04-25 NOTE — Progress Notes (Addendum)
`   Initial Nutrition Assessment  DOCUMENTATION CODES:   Severe malnutrition in context of chronic illness  INTERVENTION:  1. Ensure Enlive po BID, each supplement provides 350 kcal and 20 grams of protein 2. MVI w/ Minerals  NUTRITION DIAGNOSIS:   Malnutrition related to chronic illness as evidenced by severe depletion of muscle mass, severe depletion of body fat, energy intake < 75% for > or equal to 1 month.  GOAL:   Patient will meet greater than or equal to 90% of their needs  MONITOR:   PO intake, I & O's, Supplement acceptance, Labs, Weight trends  REASON FOR ASSESSMENT:   Other (Comment) (Low BMI)    ASSESSMENT:   83 year old female with a history of chronic bronchiectasis and chronic MAC infection who presents with shortness of breath.  Spoke with Hannah Kline and her husband at bedside. She reports poor appetite over the past year, likely related to chronic mac infection. Patient has very little appetite, husband will cook for her 3 times per day but she only eats bites throughout the day. Drinks an ensure from time to time, but nothing consistent. Reports a 20#/16% insignificant wt loss over 1 year. Has been eating well here in the hospital, states she likes our food, and appetite is improving.  Nutrition-Focused physical exam completed. Findings are sever fat depletion, severe muscle depletion, and no edema.  Underlying infection and COPD likely caused of her chronic poor appetite. Documented PO 100% thus far Labs and medications reviewed: Prednisone  Diet Order:  Diet Heart Room service appropriate? Yes; Fluid consistency: Thin  Skin:  Reviewed, no issues  Last BM:  04/24/2017  Height:   Ht Readings from Last 1 Encounters:  04/24/17 _0  (1.727 m)    Weight:   Wt Readings from Last 1 Encounters:  04/24/17 112 lb 4.8 oz (50.9 kg)    Ideal Body Weight:  63.63 kg  BMI:  Body mass index is 17.08 kg/m.  Estimated Nutritional Needs:   Kcal:   1200-1550 calories  Protein:  51-61 gm  Fluid:  >/= 1.2L  EDUCATION NEEDS:   No education needs identified at this time  Hannah Kline. Hannah Livesay, MS, RD LDN Inpatient Clinical Dietitian Pager [phone]

## 2017-04-25 NOTE — Progress Notes (Signed)
Pharmacy Antibiotic Note  Zeenat Jeanbaptiste is a 83 y.o. female admitted on 04/24/2017 with  pneumonia/HCAP.  Pharmacy has been consulted for Zosyn and Vancomycin dosing.  Plan: Will continue Zosyn 3.375 IV EI every 8 hours.   Height: 5\' 8"  (172.7 cm) Weight: 112 lb 4.8 oz (50.9 kg) IBW/kg (Calculated) : 63.9  Temp (24hrs), Avg:97.8 F (36.6 C), Min:97.6 F (36.4 C), Max:98 F (36.7 C)   Recent Labs Lab 04/24/17 0912 04/24/17 0925 04/25/17 0514  WBC 12.4*  --  15.6*  CREATININE 0.72  --  0.70  LATICACIDVEN  --  0.9  --     Estimated Creatinine Clearance: 43.6 mL/min (by C-G formula based on SCr of 0.7 mg/dL).    Allergies  Allergen Reactions  . Ciprofloxacin Other (See Comments)    c-diff  . Isoniazid     Hepatitis, weakness, nausea  . Moxifloxacin     Avelox  REACTION: chills, fainting    Antimicrobials this admission: 6/17 vancomycin >> 6/18 6/17 Zosyn  >>  azithro 6/17 x1 dose  Dose adjustments this admission:  Microbiology results: 6/17 sputum cx reincubated MRSA PCR neg BCx x2 NGTD   Thank you for allowing pharmacy to be a part of this patient's care.  Rocky Morel, PharmD, BCPS Clinical Pharmacist 04/25/2017 12:57 PM

## 2017-04-25 NOTE — Progress Notes (Addendum)
[place] [facility] [place] NAME: Hannah Kline    MR#:  [id number]  DATE OF BIRTH:  [birth year]  SUBJECTIVE:   Patient feeling better this morning. Has generalized weakness. Shortness of breath is improved  REVIEW OF SYSTEMS:    Review of Systems  Constitutional: Negative for fever, chills weight loss Positive for generalized weakness HENT: Negative for ear pain, nosebleeds, congestion, facial swelling, rhinorrhea, neck pain, neck stiffness and ear discharge.   Respiratory: Positive for cough and shortness of breath which is improved  Cardiovascular: Negative for chest pain, palpitations and leg swelling.  Gastrointestinal: Negative for heartburn, abdominal pain, vomiting, diarrhea or consitpation Genitourinary: Negative for dysuria, urgency, frequency, hematuria Musculoskeletal: Negative for back pain or joint pain Neurological: Negative for dizziness, seizures, syncope, focal weakness,  numbness and headaches.  Hematological: Does not bruise/bleed easily.  Psychiatric/Behavioral: Negative for hallucinations, confusion, dysphoric mood    Tolerating Diet: yes      DRUG ALLERGIES:   Allergies  Allergen Reactions  . Ciprofloxacin Other (See Comments)    c-diff  . Isoniazid     Hepatitis, weakness, nausea  . Moxifloxacin     Avelox  REACTION: chills, fainting    VITALS:  Blood pressure (!) 135/52, pulse 75, temperature 97.6 F (36.4 C), temperature source Oral, resp. rate 18, height _0  (1.727 m), weight 50.9 kg (112 lb 4.8 oz), SpO2 95 %.  PHYSICAL EXAMINATION:  Constitutional: Appears well-developed and well-nourished. No distress. HENT: Normocephalic. Marland Kitchen Oropharynx is clear and moist.  Eyes: Conjunctivae and EOM are normal. PERRLA, no scleral icterus.  Neck: Normal ROM. Neck supple. No JVD. No tracheal deviation. CVS: RRR, S1/S2 +, no murmurs, no gallops, no carotid bruit.  Pulmonary: Effort and breath sounds normal, no stridor,  rhonchi, wheezes, rales.  Abdominal: Soft. BS +,  no distension, tenderness, rebound or guarding.  Musculoskeletal: Normal range of motion. No edema and no tenderness.  Neuro: Alert. CN 2-12 grossly intact. No focal deficits. Skin: Skin is warm and dry. No rash noted. Psychiatric: Normal mood and affect.      LABORATORY PANEL:   CBC  Recent Labs Lab 04/25/17 0514  WBC 15.6*  HGB 11.4*  HCT 34.7*  PLT 276   ------------------------------------------------------------------------------------------------------------------  Chemistries   Recent Labs Lab 04/25/17 0514  NA 136  K 4.1  CL 101  CO2 30  GLUCOSE 134*  BUN 16  CREATININE 0.70  CALCIUM 8.3*  AST 19  ALT 21  ALKPHOS 66  BILITOT 0.6   ------------------------------------------------------------------------------------------------------------------  Cardiac Enzymes  Recent Labs Lab 04/24/17 0912  TROPONINI <0.03   ------------------------------------------------------------------------------------------------------------------  RADIOLOGY:  Dg Chest Port 1 View  Result Date: 04/24/2017 CLINICAL DATA:  83 year old female with known chronic lung infection and difficulty breathing EXAM: PORTABLE CHEST 1 VIEW COMPARISON:  CT 02/17/2017, plain film 06/14/2016 FINDINGS: Cardiomediastinal silhouette unchanged in size and contour. Heart borders partially obscured by overlying lung and pleural disease. Hyperinflation of the lungs with flattened hemidiaphragms on the AP view. Mixed interstitial and airspace opacities in a similar distribution to the comparison chest x-ray and CT study, including focal opacities of the bilateral apices, right suprahilar and upper lung, projecting over the left heart border, and the bilateral bases. Background reticular opacity throughout the lungs. No new confluent airspace disease identified. No large pleural effusion. No pneumothorax. IMPRESSION: Similar appearance of chronic lung  changes, architectural distortion, and nodular opacities in this patient with known chronic lung infection/MAC. Superimposed acute pneumonia difficult to  exclude and correlation with lab values may be useful. Electronically Signed   By: Corrie Mckusick D.O.   On: 04/24/2017 09:39     ASSESSMENT AND PLAN:   83 year old female with a history of chronic bronchiectasis and chronic MAC infection who presents with shortness of breath.   1. Acute hypoxic respiratory failure with chronic hypoxic respiratory failure on 2 L of oxygen at bedtime due to CAP with mild AECOPD Wean oxygen as tolerated  2. CAP with chronic MAC, the patient therapy with Keflex: Continue Zosyn  Follow-up on sputum culture  3. Acute exacerbation of COPD with chronic bronchiectasis, mild: Change IV steroids to oral steroids Continue nebulizer treatments Elevated wbc from steroids.  4. Chronic atrial fibrillation now in NSR: Continue Cardizem ,FLECAINIDE and eliquis  5. Hypothyroid: Continue Synthroid  Physical therapy consultation for disposition  Management plans discussed with the patient and she is in agreement.  CODE STATUS: full  TOTAL TIME TAKING CARE OF THIS PATIENT: 30 minutes.     POSSIBLE D/C tomorrow, DEPENDING ON CLINICAL CONDITION.   Reese Stockman M.D on 04/25/2017 at 10:48 AM  Between 7am to 6pm - Pager - [phone] After 6pm go to [email or web address] - password [email or web address] [facility]  Office  [phone]  CC: Primary care physician; Crecencio Mc, MD  Note: This dictation was prepared with Dragon dictation along with smaller phrase technology. Any transcriptional errors that result from this process are unintentional.

## 2017-04-25 NOTE — Evaluation (Signed)
Physical Therapy Evaluation Patient Details Name: Hannah Kline MRN: [id number] DOB: [birth year] Today's Date: 04/25/2017   History of Present Illness  Pt is an 83 y.o.femalewith a known history of COPD, chronic bronchiectasis, chronic Mycobacterium avium complex infection, chronic atrial fibrillation, hypogonadism and multiple other medical problems is presenting to the ED with a chief complaint of worsening of shortness of breath and productive cough. Patient follows with pulmonology Dr. Lake Bells as an outpatient. Patient was seen by primary care physician who started her on Keflex with no improvement.Patient is requiring continuous 2 L of oxygen at home,she usually uses oxygen at bedtime only.Assessment includes: Acute hypoxic respiratory failure secondary to COPD exacerbation and healthcare associated pneumonia, Acute COPD exacerbation with bronchiectasis and chronic Mycobacterium avium complex, healthcare associated pneumonia with chronic Mycobacterium avium complex, and chronic A-fib.    Clinical Impression  Pt presents with mild deficits in strength, transfers, gait, and balance, and moderate deficits in activity tolerance.  Pt Ind with all bed mobility tasks and Mod Ind with transfers with good effort and stability upon standing from EOB.  Pt able to amb max of 100' with RW and SBA.  Slow cadence with gait with pt fatigued after amb 100'.  SpO2 decreased from baseline of 97% to 93% on 2LO2/min with HR increasing from 93 to 98 bpm.  Pt will benefit from HHPT services upon discharge to address above deficits for decreased caregiver assistance and return to PLOF.       Follow Up Recommendations Home health PT    Equipment Recommendations  Rolling walker with 5" wheels;Other (comment) (Pt may progress to [facility] level prior to disharge)    Recommendations for Other Services       Precautions / Restrictions Precautions Precautions: Fall Restrictions Weight Bearing Restrictions: No       Mobility  Bed Mobility Overal bed mobility: Independent             General bed mobility comments: Pt able to perform all bed mobility tasks with no difficulties   Transfers Overall transfer level: Modified independent Equipment used: Rolling walker (2 wheeled)             General transfer comment: Pt able to stand from EOB at lowest position with good effort and good stability upon initial stand.  Ambulation/Gait Ambulation/Gait assistance: Supervision Ambulation Distance (Feet): 100 Feet Assistive device: Rolling walker (2 wheeled) Gait Pattern/deviations: Decreased step length - right;Decreased step length - left;Trunk flexed   Gait velocity interpretation: Below normal speed for age/gender General Gait Details: Slow cadence with gait with pt fatigued after amb 100'.  SpO2 decreased from baseline of 97% to 93% on 2LO2/min with HR increasing from mid to upper 90s.  Stairs            Wheelchair Mobility    Modified Rankin (Stroke Patients Only)       Balance Overall balance assessment: Needs assistance Sitting-balance support: Feet supported;No upper extremity supported Sitting balance-Leahy Scale: Normal     Standing balance support: Bilateral upper extremity supported Standing balance-Leahy Scale: Good                               Pertinent Vitals/Pain Pain Assessment: No/denies pain    Home Living Family/patient expects to be discharged to:: Private residence ([place] at [facility]) Living Arrangements: Spouse/significant other Available Help at Discharge: Family;Available 24 hours/day Type of Home: House Home Access: Level entry     Home  Layout: One level Home Equipment: Cane - single point      Prior Function Level of Independence: Independent with assistive device(s)         Comments: Pt Ind with Amb community distances most often without AD but uses SPC on occasion, no fall history, Ind with ADLs     Hand Dominance    Dominant Hand: Right    Extremity/Trunk Assessment        Lower Extremity Assessment Lower Extremity Assessment: Generalized weakness       Communication   Communication: No difficulties  Cognition Arousal/Alertness: Awake/alert Behavior During Therapy: WFL for tasks assessed/performed Overall Cognitive Status: Within Functional Limits for tasks assessed                                        General Comments      Exercises Total Joint Exercises Ankle Circles/Pumps: AROM;Both;5 reps;10 reps Quad Sets: Strengthening;Both;10 reps Gluteal Sets: Strengthening;Both;10 reps Heel Slides: AROM;Both;5 reps Long Arc Quad: AROM;Both;10 reps Knee Flexion: AROM;Both;10 reps Marching in Standing: AROM;Both;10 reps (In sitting) Other Exercises Other Exercises: HEP education/review for BLE APs, GS, and QS x 10 each 5-6x/day   Assessment/Plan    PT Assessment Patient needs continued PT services  PT Problem List Decreased strength;Decreased activity tolerance;Decreased balance       PT Treatment Interventions DME instruction;Gait training;Neuromuscular re-education;Balance training;Therapeutic exercise;Therapeutic activities;Patient/family education    PT Goals (Current goals can be found in the Care Plan section)  Acute Rehab PT Goals Patient Stated Goal: To be able to walk farther without feeling tired. PT Goal Formulation: With patient Time For Goal Achievement: 05/08/17 Potential to Achieve Goals: Good    Frequency Min 2X/week   Barriers to discharge        Co-evaluation               AM-PAC PT "6 Clicks" Daily Activity  Outcome Measure Difficulty turning over in bed (including adjusting bedclothes, sheets and blankets)?: None Difficulty moving from lying on back to sitting on the side of the bed? : None Difficulty sitting down on and standing up from a chair with arms (e.g., wheelchair, bedside commode, etc,.)?: None Help needed moving to  and from a bed to chair (including a wheelchair)?: None Help needed walking in hospital room?: A Little Help needed climbing 3-5 steps with a railing? : A Little 6 Click Score: 22    End of Session Equipment Utilized During Treatment: Gait belt;Oxygen Activity Tolerance: Patient limited by fatigue Patient left: in bed;with bed alarm set;with call bell/phone within reach Nurse Communication: Mobility status PT Visit Diagnosis: Muscle weakness (generalized) (M62.81);Difficulty in walking, not elsewhere classified (R26.2)    Time: 7741-2878 PT Time Calculation (min) (ACUTE ONLY): 35 min   Charges:   PT Evaluation $PT Eval Low Complexity: 1 Procedure PT Treatments $Therapeutic Exercise: 8-22 mins   PT G Codes:        DRoyetta Asal PT, DPT 04/25/17, 3:43 PM

## 2017-04-26 ENCOUNTER — Telehealth: Payer: Self-pay | Admitting: *Deleted

## 2017-04-26 LAB — BASIC METABOLIC PANEL
Anion gap: 6 (ref 5–15)
BUN: 21 mg/dL — AB (ref 6–20)
CO2: 31 mmol/L (ref 22–32)
CREATININE: 0.75 mg/dL (ref 0.44–1.00)
Calcium: 8.6 mg/dL — ABNORMAL LOW (ref 8.9–10.3)
Chloride: 100 mmol/L — ABNORMAL LOW (ref 101–111)
GFR calc Af Amer: >60 mL/min (ref >60)
Glucose, Bld: 86 mg/dL (ref 65–99)
POTASSIUM: 4 mmol/L (ref 3.5–5.1)
SODIUM: 137 mmol/L (ref 135–145)

## 2017-04-26 LAB — CBC
HCT: 37 % (ref 35.0–47.0)
Hemoglobin: 11.9 g/dL — ABNORMAL LOW (ref 12.0–16.0)
MCH: 30.7 pg (ref 26.0–34.0)
MCHC: 32.3 g/dL (ref 32.0–36.0)
MCV: 95.1 fL (ref 80.0–100.0)
PLATELETS: 313 10*3/uL (ref 150–440)
RBC: 3.89 MIL/uL (ref 3.80–5.20)
RDW: 14.4 % (ref 11.5–14.5)
WBC: 23 10*3/uL — AB (ref 3.6–11.0)

## 2017-04-26 LAB — C DIFFICILE QUICK SCREEN W PCR REFLEX
C DIFFICILE (CDIFF) INTERP: NOT DETECTED
C DIFFICILE (CDIFF) TOXIN: NEGATIVE
C DIFFICLE (CDIFF) ANTIGEN: NEGATIVE

## 2017-04-26 MED ORDER — LEVOFLOXACIN 750 MG PO TABS: 750.0000 mg | ORAL_TABLET | ORAL | 0 refills | Status: DC

## 2017-04-26 MED ORDER — PREDNISONE 50 MG PO TABS: ORAL_TABLET | ORAL | 0 refills | Status: DC

## 2017-04-26 MED ORDER — LEVOFLOXACIN 750 MG PO TABS: 750.0000 mg | ORAL_TABLET | ORAL | 0 refills | Status: AC

## 2017-04-26 MED ORDER — SILDENAFIL CITRATE 20 MG PO TABS: 20.0000 mg | ORAL_TABLET | ORAL | Status: DC

## 2017-04-26 MED ORDER — ENSURE ENLIVE PO LIQD: 237.0000 mL | Freq: Two times a day (BID) | ORAL | 12 refills | Status: DC

## 2017-04-26 MED ORDER — LEVOFLOXACIN 750 MG PO TABS: 750.0000 mg | ORAL_TABLET | Freq: Every day | ORAL | 0 refills | Status: DC

## 2017-04-26 MED ORDER — LEVOFLOXACIN 500 MG PO TABS
500.0000 mg | ORAL_TABLET | Freq: Every day | ORAL | Status: DC
  Administered 2017-04-26: 500 mg via ORAL
  Filled 2017-04-26: qty 1

## 2017-04-26 NOTE — Progress Notes (Signed)
Physical Therapy Treatment Patient Details Name: Hannah Kline MRN: [id number] DOB: 1931/11/12 Today's Date: 04/26/2017    History of Present Illness Pt is an 83 y.o.femalewith a known history of COPD, chronic bronchiectasis, chronic Mycobacterium avium complex infection, chronic atrial fibrillation, hypogonadism and multiple other medical problems is presenting to the ED with a chief complaint of worsening of shortness of breath and productive cough. Patient follows with pulmonology Dr. Lake Bells as an outpatient. Patient was seen by primary care physician who started her on Keflex with no improvement.Patient is requiring continuous 2 L of oxygen at home,she usually uses oxygen at bedtime only.Assessment includes: Acute hypoxic respiratory failure secondary to COPD exacerbation and healthcare associated pneumonia, Acute COPD exacerbation with bronchiectasis and chronic Mycobacterium avium complex, healthcare associated pneumonia with chronic Mycobacterium avium complex, and chronic A-fib.    PT Comments    Pt with no difficulties with bed mobility or transfers.  She was able to ambulate x 2 around nursing unit with RW and assist for O2 tank.  She stated she does not use a walker at home and uses a cane.  She stated she did not want to try to walk with a cane this am stating she felt safer with a walker.  "I'll be fine when I get home"  She stated she is not interested in a walker for home at this time.     Follow Up Recommendations  Home health PT     Equipment Recommendations   Rolling walker - which she is declining at this time.    Recommendations for Other Services       Precautions / Restrictions Precautions Precautions: Fall Restrictions Weight Bearing Restrictions: No    Mobility  Bed Mobility Overal bed mobility: Independent             General bed mobility comments: Pt able to perform all bed mobility tasks with no difficulties   Transfers Overall transfer level:  Modified independent Equipment used: Rolling walker (2 wheeled)                Ambulation/Gait Ambulation/Gait assistance: Supervision Ambulation Distance (Feet): 320 Feet Assistive device: Rolling walker (2 wheeled) Gait Pattern/deviations: Step-through pattern   Gait velocity interpretation: Below normal speed for age/gender General Gait Details: Generally steady with good safety and confidence with walker   Stairs            Wheelchair Mobility    Modified Rankin (Stroke Patients Only)       Balance Overall balance assessment: Needs assistance Sitting-balance support: Feet supported;No upper extremity supported       Standing balance support: Bilateral upper extremity supported Standing balance-Leahy Scale: Good                              Cognition Arousal/Alertness: Awake/alert Behavior During Therapy: WFL for tasks assessed/performed Overall Cognitive Status: Within Functional Limits for tasks assessed                                        Exercises      General Comments        Pertinent Vitals/Pain Pain Assessment: No/denies pain    Home Living                      Prior Function  PT Goals (current goals can now be found in the care plan section) Progress towards PT goals: Progressing toward goals    Frequency    Min 2X/week      PT Plan Current plan remains appropriate    Co-evaluation              AM-PAC PT "6 Clicks" Daily Activity  Outcome Measure  Difficulty turning over in bed (including adjusting bedclothes, sheets and blankets)?: None   Difficulty sitting down on and standing up from a chair with arms (e.g., wheelchair, bedside commode, etc,.)?: None Help needed moving to and from a bed to chair (including a wheelchair)?: None Help needed walking in hospital room?: A Little Help needed climbing 3-5 steps with a railing? : A Little 6 Click Score: 18    End  of Session Equipment Utilized During Treatment: Gait belt;Oxygen Activity Tolerance: Patient limited by fatigue Patient left: in bed;with call bell/phone within reach         Time: 9485-4627 PT Time Calculation (min) (ACUTE ONLY): 15 min  Charges:  $Gait Training: 8-22 mins                    G Codes:      Chesley Noon, PTA 04/26/17, 9:41 AM

## 2017-04-26 NOTE — Telephone Encounter (Signed)
Pt called and stated that she was discharged a few hours ago and needs to make a HFU with Dr. Derrel Nip in a couple of days. Pt states that she was in for pneumonia. Please advise, thank you!  Call pt @ [phone]

## 2017-04-26 NOTE — Telephone Encounter (Signed)
Patient will discharge from [facility] on 06/19. Patient will need a appt time and date  Pt contact [phone]

## 2017-04-26 NOTE — Progress Notes (Signed)
Patient is discharge home in a stable condition, summary and f/u care given, verbalized understanding . 

## 2017-04-26 NOTE — Discharge Summary (Signed)
[facility] [place] NAME: Hannah Kline    MR#:  [id number]  DATE OF BIRTH:  1932/06/16  DATE OF ADMISSION:  04/24/2017 ADMITTING PHYSICIAN: Nicholes Mango, MD  DATE OF DISCHARGE: 04/26/2017  PRIMARY CARE PHYSICIAN: Crecencio Mc, MD    ADMISSION DIAGNOSIS:  Bronchiectasis with acute exacerbation (HCC) [J47.1] Shortness of breath [R06.02] SOB (shortness of breath) [R06.02] COPD exacerbation (HCC) [J44.1]  DISCHARGE DIAGNOSIS:  Active Problems:   Acute bronchitis with chronic obstructive pulmonary disease (COPD) ([place])   SECONDARY DIAGNOSIS:   Past Medical History:  Diagnosis Date  . Atrial fibrillation ([place])   . Bacterial infection due to Pseudomonas   . Bronchiectasis   . COPD (chronic obstructive pulmonary disease) ([place])    . Cough    from bronchiectasis neb txs  . Dysrhythmia    A Fib  . Hepatitis    h/o INH(isoniazide)  . History of Mycobacterium avium complex infection    lung disease  . Hypothyroidism   . Pleurisy 12/08/11  . Shortness of breath dyspnea     HOSPITAL COURSE:  83 year old female with a history of chronic bronchiectasis and chronic MAC infection who presents with shortness of breath.   1. Acute hypoxic respiratory failure with chronic hypoxic respiratory failure on 2 L of oxygen at bedtime due to CAP with mild AECOPD Patient has been weaned to baseline oxygen   2. CAP with chronic MAC: Patient will be discharged with Levaquin. Ciprofloxacin is listed as an allergy because patient had C. difficile with this. Patient needs repeat CBC in 2 days. Increase white blood cell count is partially thought to be due to IV steroids that were given.   3. Acute exacerbation of COPD with chronic bronchiectasis, mild: She'll be discharged on prednisone 50 mg for 3 days then stop. She has no wheezing on examination.  4. Chronic atrial fibrillation now in NSR: Continue Cardizem ,FLECAINIDE and eliquis  5.  Hypothyroid: Continue Synthroid    DISCHARGE CONDITIONS AND DIET:   Patient stable for discharge on heart healthy diet  CONSULTS OBTAINED:    DRUG ALLERGIES:   Allergies  Allergen Reactions  . Ciprofloxacin Other (See Comments)    c-diff  . Isoniazid     Hepatitis, weakness, nausea  . Moxifloxacin     Avelox  REACTION: chills, fainting    DISCHARGE MEDICATIONS:   Current Discharge Medication List    START taking these medications   Details  feeding supplement, ENSURE ENLIVE, (ENSURE ENLIVE) LIQD Take 237 mLs by mouth 2 (two) times daily between meals. Qty: 237 mL, Refills: 12    levofloxacin (LEVAQUIN) 750 MG tablet Take 1 tablet (750 mg total) by mouth daily. Qty: 5 tablet, Refills: 0    predniSONE (DELTASONE) 50 MG tablet Take 1 tablet for 3 days and stop Qty: 3 tablet, Refills: 0      CONTINUE these medications which have CHANGED   Details  sildenafil (REVATIO) 20 MG tablet Take 1 tablet (20 mg total) by mouth See admin instructions. 3 times a week. Takes to help her heart pump more blood to her lungs      CONTINUE these medications which have NOT CHANGED   Details  apixaban (ELIQUIS) 2.5 MG TABS tablet Take 1 tablet (2.5 mg total) by mouth 2 (two) times daily. Qty: 180 tablet, Refills: 3    arformoterol (BROVANA) 15 MCG/2ML NEBU Take 2 mLs (15 mcg total) by nebulization 2 (two) times daily. Qty: 120 mL, Refills:  11    budesonide (PULMICORT) 0.5 MG/2ML nebulizer solution Take 2 mLs (0.5 mg total) by nebulization 2 (two) times daily. Qty: 120 mL, Refills: 11    diltiazem (CARDIZEM) 30 MG tablet Take 1 tablet (30 mg total) by mouth 3 (three) times daily as needed (atrial fibrillation). Qty: 270 tablet, Refills: 3    dorzolamide (TRUSOPT) 2 % ophthalmic solution Place 1 drop into the left eye 2 (two) times daily. Refills: 7    flecainide (TAMBOCOR) 50 MG tablet Take 1.5 tablets (75 mg total) by mouth 2 (two) times daily.    furosemide (LASIX) 20 MG  tablet Take 1 tablet (20 mg total) by mouth as needed for fluid (shortness of breath). Qty: 30 tablet, Refills: 6    levothyroxine (SYNTHROID, LEVOTHROID) 75 MCG tablet TAKE 1 TABLET (75 MCG TOTAL) BY MOUTH DAILY. Qty: 30 tablet, Refills: 0   Associated Diagnoses: Acquired hypothyroidism    potassium chloride (K-DUR) 10 MEQ tablet Take 1 tablet (10 mEq total) by mouth as needed (take with Lasix). Qty: 30 tablet, Refills: 6    OXYGEN Inhale 2 L into the lungs. [place] - when exercising, walking around, and at night    Respiratory Therapy Supplies (FLUTTER) DEVI Use as directed Qty: 1 each, Refills: 0          Today   CHIEF COMPLAINT:   Patient had diarrhea this morning. Shortness of breath is improved.   VITAL SIGNS:  Blood pressure (!) 128/53, pulse 67, temperature 98.3 F (36.8 C), temperature source Oral, resp. rate 17, height _0  (1.727 m), weight 50.9 kg (112 lb 4.8 oz), SpO2 100 %.   REVIEW OF SYSTEMS:  Review of Systems  Constitutional: Negative.  Negative for chills, fever and malaise/fatigue.  HENT: Negative.  Negative for ear discharge, ear pain, hearing loss, nosebleeds and sore throat.   Eyes: Negative.  Negative for blurred vision and pain.  Respiratory: Negative.  Negative for cough, hemoptysis, shortness of breath and wheezing.   Cardiovascular: Negative.  Negative for chest pain, palpitations and leg swelling.  Gastrointestinal: Positive for diarrhea. Negative for abdominal pain, blood in stool, nausea and vomiting.  Genitourinary: Negative.  Negative for dysuria.  Musculoskeletal: Negative.  Negative for back pain.  Skin: Negative.   Neurological: Negative for dizziness, tremors, speech change, focal weakness, seizures and headaches.  Endo/Heme/Allergies: Negative.  Does not bruise/bleed easily.  Psychiatric/Behavioral: Negative.  Negative for depression, hallucinations and suicidal ideas.     PHYSICAL EXAMINATION:  GENERAL:  83 y.o.-year-old patient  lying in the bed with no acute distress.  NECK:  Supple, no jugular venous distention. No thyroid enlargement, no tenderness.  LUNGS: Normal breath sounds bilaterally, no wheezing, rales,rhonchi  No use of accessory muscles of respiration.  CARDIOVASCULAR: S1, S2 normal. No murmurs, rubs, or gallops.  ABDOMEN: Soft, non-tender, non-distended. Bowel sounds present. No organomegaly or mass.  EXTREMITIES: No pedal edema, cyanosis, or clubbing.  PSYCHIATRIC: The patient is alert and oriented x 3.  SKIN: No obvious rash, lesion, or ulcer.   DATA REVIEW:   CBC  Recent Labs Lab 04/26/17 0624  WBC 23.0*  HGB 11.9*  HCT 37.0  PLT 313    Chemistries   Recent Labs Lab 04/25/17 0514 04/26/17 0624  NA 136 137  K 4.1 4.0  CL 101 100*  CO2 30 31  GLUCOSE 134* 86  BUN 16 21*  CREATININE 0.70 0.75  CALCIUM 8.3* 8.6*  AST 19  --   ALT 21  --  ALKPHOS 66  --   BILITOT 0.6  --     Cardiac Enzymes  Recent Labs Lab 04/24/17 0912  TROPONINI <0.03    Microbiology Results  _0 @  RADIOLOGY:  No results found.    Current Discharge Medication List    START taking these medications   Details  feeding supplement, ENSURE ENLIVE, (ENSURE ENLIVE) LIQD Take 237 mLs by mouth 2 (two) times daily between meals. Qty: 237 mL, Refills: 12    levofloxacin (LEVAQUIN) 750 MG tablet Take 1 tablet (750 mg total) by mouth daily. Qty: 5 tablet, Refills: 0    predniSONE (DELTASONE) 50 MG tablet Take 1 tablet for 3 days and stop Qty: 3 tablet, Refills: 0      CONTINUE these medications which have CHANGED   Details  sildenafil (REVATIO) 20 MG tablet Take 1 tablet (20 mg total) by mouth See admin instructions. 3 times a week. Takes to help her heart pump more blood to her lungs      CONTINUE these medications which have NOT CHANGED   Details  apixaban (ELIQUIS) 2.5 MG TABS tablet Take 1 tablet (2.5 mg total) by mouth 2 (two) times daily. Qty: 180 tablet, Refills: 3     arformoterol (BROVANA) 15 MCG/2ML NEBU Take 2 mLs (15 mcg total) by nebulization 2 (two) times daily. Qty: 120 mL, Refills: 11    budesonide (PULMICORT) 0.5 MG/2ML nebulizer solution Take 2 mLs (0.5 mg total) by nebulization 2 (two) times daily. Qty: 120 mL, Refills: 11    diltiazem (CARDIZEM) 30 MG tablet Take 1 tablet (30 mg total) by mouth 3 (three) times daily as needed (atrial fibrillation). Qty: 270 tablet, Refills: 3    dorzolamide (TRUSOPT) 2 % ophthalmic solution Place 1 drop into the left eye 2 (two) times daily. Refills: 7    flecainide (TAMBOCOR) 50 MG tablet Take 1.5 tablets (75 mg total) by mouth 2 (two) times daily.    furosemide (LASIX) 20 MG tablet Take 1 tablet (20 mg total) by mouth as needed for fluid (shortness of breath). Qty: 30 tablet, Refills: 6    levothyroxine (SYNTHROID, LEVOTHROID) 75 MCG tablet TAKE 1 TABLET (75 MCG TOTAL) BY MOUTH DAILY. Qty: 30 tablet, Refills: 0   Associated Diagnoses: Acquired hypothyroidism    potassium chloride (K-DUR) 10 MEQ tablet Take 1 tablet (10 mEq total) by mouth as needed (take with Lasix). Qty: 30 tablet, Refills: 6    OXYGEN Inhale 2 L into the lungs.  - when exercising, walking around, and at night    Respiratory Therapy Supplies (FLUTTER) DEVI Use as directed Qty: 1 each, Refills: 0          Management plans discussed with the patient and she is in agreement. Stable for discharge home  Patient should follow up with pcp  CODE STATUS:     Code Status Orders        Start     Ordered   04/24/17 1151  Full code  Continuous     04/24/17 1150    Code Status History    Date Active Date Inactive Code Status Order ID Comments User Context   This patient has a current code status but no historical code status.    Advance Directive Documentation     Most Recent Value  Type of Advance Directive  Healthcare Power of Attorney  Pre-existing out of facility DNR order (yellow form or pink MOST form)  -  "MOST"  Form in Place?  -  TOTAL TIME TAKING CARE OF THIS PATIENT: 37 minutes.    Note: This dictation was prepared with Dragon dictation along with smaller phrase technology. Any transcriptional errors that result from this process are unintentional.  Aaliah Jorgenson M.D on 04/26/2017 at 10:06 AM  Between 7am to 6pm - Pager - [phone] After 6pm go to [email or web address] - password [email or web address] [facility]  Office  [phone]  CC: Primary care physician; Crecencio Mc, MD

## 2017-04-26 NOTE — Clinical Social Work Note (Signed)
CSW received referral for SNF.  Case discussed with case manager and plan is to discharge home with home health.  CSW to sign off please re-consult if social work needs arise.  Kenshawn Maciolek R. Torii Royse, MSW, LCSWA [phone]  

## 2017-04-26 NOTE — Care Management Note (Signed)
Case Management Note  Patient Details  Name: Hannah Kline MRN: [id number] Date of Birth: 1932-08-20   Patient admitted from [facility] independent with Acute hypoxic respiratory failure.  Patient lives at home with husband.  [facility].  PCP [place].  PT has assessed patient and recommended home health PT and RW.  Home health orders placed for RN and PT.   Patient in agreement to services.  Home health agency preference provided.  [facility].  Corene Cornea with [facility] notified of referral.  Patient states that she has a cane in the home for ambulation, and declines RW at discharge.  RNCM signing off   Subjective/Objective:                    Action/Plan:   Expected Discharge Date:  04/26/17               Expected Discharge Plan:  [place]  In-House Referral:     Discharge planning Services  CM Consult  Post Acute Care Choice:  Home Health Choice offered to:  Patient  DME Arranged:    DME Agency:     HH Arranged:  RN, PT [place] Agency:  [place]  Status of Service:  Completed, signed off  If discussed at [place] of Stay Meetings, dates discussed:    Additional Comments:  Beverly Sessions, RN 04/26/2017, 12:17 PM

## 2017-04-27 ENCOUNTER — Ambulatory Visit (INDEPENDENT_AMBULATORY_CARE_PROVIDER_SITE_OTHER): Payer: Medicare Other | Admitting: Pulmonary Disease

## 2017-04-27 ENCOUNTER — Encounter: Payer: Self-pay | Admitting: Pulmonary Disease

## 2017-04-27 VITALS — BP 124/64 | HR 80 | Ht 68.0 in | Wt 115.6 lb

## 2017-04-27 DIAGNOSIS — J411 Mucopurulent chronic bronchitis: Secondary | ICD-10-CM

## 2017-04-27 DIAGNOSIS — J471 Bronchiectasis with (acute) exacerbation: Secondary | ICD-10-CM | POA: Diagnosis not present

## 2017-04-27 LAB — CULTURE, RESPIRATORY W GRAM STAIN
Culture: NORMAL
Special Requests: NORMAL

## 2017-04-27 NOTE — Telephone Encounter (Signed)
Transition Care Management Follow-up Telephone Call  How have you been since you were released from the hospital? Patient stated she feels pretty good but just tired and worn out.  Do you understand why you were in the hospital? Yes because I had pneumonia,.   Do you understand the discharge instrcutions? Yes  Items Reviewed:  Medications reviewed: Yes  Allergies reviewed: yes  Dietary changes reviewed: yes  Referrals reviewed: yes   Functional Questionnaire:   Activities of Daily Living (ADLs):   She states they are independent in the following: Patient independent in ADLs. States they require assistance with the following:no assist needed at this time   Any transportation issues/concerns?:no   Any patient concerns? No   Confirmed importance and date/time of follow-up visits scheduled: yes   Confirmed with patient if condition begins to worsen call PCP or go to the ER.  Patient was given the Call-a-Nurse line [phone]: Yes.

## 2017-04-27 NOTE — Progress Notes (Signed)
Subjective:    Patient ID: Hannah Kline, female    DOB: 1932/01/22, 83 y.o.   MRN: [id number] Synopsis: Hannah Kline established care with the [facility] pulmonary office in February 2013 for bronchiectasis due to [place]. She was diagnosed at age 57 in [place]. She was treated with rifampin ethambutol and clarithromycin for 2 years. Since then she does not believe she was ever grown out MAI.  She had c.diff colitis in 2015 which was refractory to treatment.    HPI  Chief Complaint  Patient presents with  . Follow-up    recently hospitalized for pna.  states she has minimal prod cough, some fatigue.   Hannah Kline says that she was just released for the hospital.  She had been in afib for 9 days.   She woke up on Sunday morning and she couldn't get dressed and she could barely breathe.   She says that she was in Afib during this time.  She had no fever, or chills.  She said that there was no change in her baseline mucus production.  She did cough up a lot of mucus however in the emergency room. She continued to produce a lot of mucus over the next few days.  She feels a little better now.   Her heart rate is under better control now.    Past Medical History:  Diagnosis Date  . Atrial fibrillation ([place])   . Bacterial infection due to Pseudomonas   . Bronchiectasis   . COPD (chronic obstructive pulmonary disease) ([place])    . Cough    from bronchiectasis neb txs  . Dysrhythmia    A Fib  . Hepatitis    h/o INH(isoniazide)  . History of Mycobacterium avium complex infection    lung disease  . Hypothyroidism   . Pleurisy 12/08/11  . Shortness of breath dyspnea      Review of Systems  Constitutional: Negative for appetite change, chills, fatigue, fever and unexpected weight change.  HENT: Negative for congestion, ear pain and nosebleeds.   Respiratory: Negative for cough, choking and shortness of breath.   Cardiovascular: Negative for chest pain and leg swelling.        Objective:   Physical Exam Vitals:   04/27/17 1555  BP: 124/64  Pulse: 80  SpO2: 94%  Weight: 115 lb 9.6 oz (52.4 kg)  Height: 5' 8"  (1.727 m)   2L   Gen: thin, chronically ill appearing HENT: OP clear,  neck supple PULM: Crackles bases, no wheezing B, normal percussion CV: RRR, no mgr, trace edema GI: BS+, soft, nontender Derm: thin skin, scattered bruising Psyche: normal mood and affect   Discharge summary from [facility] reviewed dated yesterday. She was hospitalized for acute hypoxemic respiratory failure secondary to an exacerbation of her bronchiectasis and community-acquired pneumonia. She was treated with IV Levaquin and Solu-Medrol.     Micro Review of 2012 sputum microbiology: Pan sensitive pseudomonas  10/11/2012 Sputum: PSEUDOMONAS AERUGINOSA       Antibiotic  Sensitivity  Microscan  Status    CEFEPIME  Sensitive  2  Final    CEFTAZIDIME  Sensitive  <=1  Final    CIPROFLOXACIN  Intermediate  2  Final    GENTAMICIN  Sensitive  <=1  Final    IMIPENEM  Sensitive  1  Final    LEVOFLOXACIN  Intermediate  4  Final    PIP/TAZO  Sensitive  <=4  Final    TOBRAMYCIN  Sensitive  <=  1  Final    January 2014 Sputum AFB > neg x3   2015 Sputum> Pseudomonas , Cipro/Levofloxacin Intermediate suscept; Cefepime,Ceftaz, Imi, Pip-Tazo, Gent all susceptible  September 2015 sputum culture> Klebsiella resistant to amoxicillin but susceptible to Augmentin and multiple other agents  January - February 2016 Sputum AFB negative x 3  12/2015 AFB sputum > MAI 03/2016 AFB sputum > negative  PFT 09/2010 Full PFT: Ratio 63%, FEV1 1.49L (67% pred) December 2013 simple spirometry FEV1 0.8 L January 2014 simple spirometry >> not reproducible by ATS standards 11/2013 FEV1 0.93 L April 2018 simple spirometry ratio 61%, FEV1 0.67 L 30% predicted, FVC 1.0 936% predicted  Imaging 10/2011 CT Chest: impressive upper lobe cystic bronchiectasis, scattered  lower lobe tree-in-bud abnormalities and scattered nodules; One RML solid nodule measures 7.89m  January 2013 CT chest [facility]: Stable upper lobe cystic bronchiectasis also present in the right middle lobe. Scattered tree in bud abnormalities and nodules throughout all lung fields roughly unchanged from prior. Right middle lobe nodule is no longer visible but there is an 810mnodule in the right upper lobe.  June 2018 high-resolution CT scan of the chest images independently reviewed showing tree in bud abnormalities and cylindrical bronchiectasis in the upper lobes with some nodules noted, findings worse in the right middle lobe and the lingula with subsegmental atelectasis in the right upper lobe. There is a nodule in the right base which is not changed. Overall impression is consistent with bronchiectasis and findings worrisome for an atypical lung infection like Mycobacterium avium intercellular.     Assessment & Plan:  Bronchiectasis with acute exacerbation History of MAI A. fib Chronic respiratory failure with hypoxemia  Discussion: Gr[place] an exacerbation of her bronchiectasis last week and was hospitalized. She says that this occurred with 9 days of A. Fib preceding the hospitalization. It is unclear to me if her A. fib is a barometer for her underlying lung disease or if it's happening independent. Given the severity of her chronic lung disease I favor the former rather than the latter.  The images from her high-resolution CT scan of the chest did not reveal any new abnormalities. We have perform surveillance culture testing regularly without consistent evidence that she's had recurrent MAI but I'm beginning to worry about this a bit more considering the weight loss and other nonspecific symptoms she's had in the last year such as fatigue and shortness of breath.  Moving forward I will sample her mucus to see if there is evidence of recurrent MAI and then treat as appropriate.  I want her to  complete the treatment regimen for bronchiectasis as [facility] She will follow-up with cardiology, at this time I don't see indication that they need to try to aggressively change her A. fib management as I believe her heart is responding to her sick lungs.  In regards to her pulmonary hypertension, I worry about the addition of sildenafil as this effectively shunts blood to sick lung. I've asked her to stop this medication. I have asked her to use her oxygen continuously as this is the most potent pulmonary vasodilator available.  Plan: For your bronchiectasis exacerbation: Complete Levaquin Take a probiotic Finish the steroid as prescribed Deep taking Brovana and Pulmicort twice a day Keep using the flutter valve twice a day Perform mucociliary clearance measures twice a day (it least flutter valve, exercise counts as well) Provide [place] a sample of your mucus 3 separate times  For your chronic respiratory failure with hypoxemia: Sleep with oxygen every night, not as needed Use oxygen every time you exert yourself  For your pulmonary hypertension: Stop sildenafil Continue using diuretics as directed by Dr. Rockey Situ  We will see you back in 6-8 weeks or sooner if needed   Updated Medication List Outpatient Encounter Prescriptions as of 04/27/2017  Medication Sig  . apixaban (ELIQUIS) 2.5 MG TABS tablet Take 1 tablet (2.5 mg total) by mouth 2 (two) times daily.  Marland Kitchen arformoterol (BROVANA) 15 MCG/2ML NEBU Take 2 mLs (15 mcg total) by nebulization 2 (two) times daily.  . budesonide (PULMICORT) 0.5 MG/2ML nebulizer solution Take 2 mLs (0.5 mg total) by nebulization 2 (two) times daily.  Marland Kitchen diltiazem (CARDIZEM) 30 MG tablet Take 1 tablet (30 mg total) by mouth 3 (three) times daily as needed (atrial fibrillation).  . dorzolamide (TRUSOPT) 2 % ophthalmic solution Place 1 drop into the left eye 2 (two) times daily.  . feeding supplement, ENSURE ENLIVE,  (ENSURE ENLIVE) LIQD Take 237 mLs by mouth 2 (two) times daily between meals.  . flecainide (TAMBOCOR) 50 MG tablet Take 1.5 tablets (75 mg total) by mouth 2 (two) times daily.  . furosemide (LASIX) 20 MG tablet Take 1 tablet (20 mg total) by mouth as needed for fluid (shortness of breath).  Marland Kitchen levofloxacin (LEVAQUIN) 750 MG tablet Take 1 tablet (750 mg total) by mouth every other day.  . levothyroxine (SYNTHROID, LEVOTHROID) 75 MCG tablet TAKE 1 TABLET (75 MCG TOTAL) BY MOUTH DAILY.  Marland Kitchen OXYGEN Inhale 2 L into the lungs. [place] - when exercising, walking around, and at night  . potassium chloride (K-DUR) 10 MEQ tablet Take 1 tablet (10 mEq total) by mouth as needed (take with Lasix).  . predniSONE (DELTASONE) 50 MG tablet Take 1 tablet for 3 days and stop  . Respiratory Therapy Supplies (FLUTTER) DEVI Use as directed  . sildenafil (REVATIO) 20 MG tablet Take 1 tablet (20 mg total) by mouth See admin instructions. 3 times a week. Takes to help her heart pump more blood to her lungs   No facility-administered encounter medications on file as of 04/27/2017.

## 2017-04-27 NOTE — Telephone Encounter (Signed)
Patient was advised by nurse per PCP protocol especially with patient with HX of C-Diff to take a probiotic with the ABX and for 2 weeks after completion of ABX

## 2017-04-27 NOTE — Patient Instructions (Addendum)
For your bronchiectasis exacerbation: Complete Levaquin Take a probiotic Finish the steroid as prescribed Deep taking Brovana and Pulmicort twice a day Keep using the flutter valve twice a day Perform mucociliary clearance measures twice a day (it least flutter valve, exercise counts as well) Provide [place] with a sample of your mucus 3 separate times  For your chronic respiratory failure with hypoxemia: Sleep with oxygen every night, not as needed Use oxygen every time you exert yourself  For your pulmonary hypertension: Stop sildenafil Continue using diuretics as directed by Dr. Rockey Situ  We will see you back in 6-8 weeks or sooner if needed

## 2017-04-29 LAB — CULTURE, BLOOD (ROUTINE X 2)
CULTURE: NO GROWTH
Culture: NO GROWTH
Special Requests: ADEQUATE
Special Requests: ADEQUATE

## 2017-05-02 ENCOUNTER — Ambulatory Visit (INDEPENDENT_AMBULATORY_CARE_PROVIDER_SITE_OTHER): Payer: Medicare Other | Admitting: Internal Medicine

## 2017-05-02 ENCOUNTER — Encounter: Payer: Self-pay | Admitting: Internal Medicine

## 2017-05-02 VITALS — BP 118/56 | HR 97 | Temp 98.4°F | Resp 17 | Ht 68.0 in | Wt 115.6 lb

## 2017-05-02 DIAGNOSIS — Z9981 Dependence on supplemental oxygen: Secondary | ICD-10-CM | POA: Diagnosis not present

## 2017-05-02 DIAGNOSIS — D72823 Leukemoid reaction: Secondary | ICD-10-CM

## 2017-05-02 DIAGNOSIS — K1379 Other lesions of oral mucosa: Secondary | ICD-10-CM

## 2017-05-02 DIAGNOSIS — R630 Anorexia: Secondary | ICD-10-CM | POA: Diagnosis not present

## 2017-05-02 DIAGNOSIS — Z09 Encounter for follow-up examination after completed treatment for conditions other than malignant neoplasm: Secondary | ICD-10-CM

## 2017-05-02 DIAGNOSIS — R0902 Hypoxemia: Secondary | ICD-10-CM | POA: Diagnosis not present

## 2017-05-02 DIAGNOSIS — D72829 Elevated white blood cell count, unspecified: Secondary | ICD-10-CM

## 2017-05-02 MED ORDER — MIRTAZAPINE 15 MG PO TABS: 15.0000 mg | ORAL_TABLET | Freq: Every day | ORAL | 2 refills | Status: DC

## 2017-05-02 NOTE — Progress Notes (Signed)
[facility]  Patient Status: not taken under care, patient refused [place] services. Hannah Kline, CM & Hannah Kline, CM both made aware.     Hannah Kline 05/02/2017, 9:22 AM

## 2017-05-02 NOTE — Patient Instructions (Addendum)
   I want you to gain weight too!  You can Increase your protein intake by using the premier protein vanilla flavored protein drink instead of milk when you  have cereal.    I am starting you on a Trial of Remeron (mirtazipine) at bedtime to help improve your appetite .  Start with 1/2 tablet daily at bedtime.  You can increase to a full tablet after a week.    Your "mucous membranes" (mouth, vagina) may be sore from anearly yeast infection,  If you develop vaginal discharge/itching,  A beefy red tongue or a  white coating on your tongue, let me know and I will send in a medication to take for two days called  fluconazole.

## 2017-05-02 NOTE — Progress Notes (Signed)
Subjective:  Patient ID: Hannah Kline, female    DOB: 12/24/1931  Age: 83 y.o. MRN: [id number]  CC: The primary encounter diagnosis was Leukocytosis, unspecified type. Diagnoses of Hypocalcemia, Anorexia, Hypoxemia requiring supplemental oxygen, Leukemoid reaction, Mouth pain, and Hospital discharge follow-up were also pertinent to this visit.  HPI   Hannah Kline presents for hospital follow up . Admitted June 17 to [facility] with acute bronchitis. Treated with supplemental 02, levaquin, and steroids.  She was weaned off of oxygen prior to discharge and sent home on June 19 on prednisone 50 mg daily x 3.  She has felt poorly since discharge , she reports shortness of breath , minially productive cough and generalized weakness, denies fevers.   She has been Wearing 02 most of the time because she feels better .  Has seen pulmonology , mcquaid last week.  Who stopped the sildenafil  Appetite decent.  c dif negative jun 19  Negative on jun 19th.  Increased her probiotic to tid and stools are firming up as of today. BMS are small   Reports soreness of vagina, tongue and all other mucous membranes without itching .  Weight stable. Wants to gain weight .      Atrial fib: takes flecainide twice daily.  Uses diltiazem as needed for rate control.        Outpatient Medications Prior to Visit  Medication Sig Dispense Refill  . apixaban (ELIQUIS) 2.5 MG TABS tablet Take 1 tablet (2.5 mg total) by mouth 2 (two) times daily. 180 tablet 3  . arformoterol (BROVANA) 15 MCG/2ML NEBU Take 2 mLs (15 mcg total) by nebulization 2 (two) times daily. 120 mL 11  . budesonide (PULMICORT) 0.5 MG/2ML nebulizer solution Take 2 mLs (0.5 mg total) by nebulization 2 (two) times daily. 120 mL 11  . diltiazem (CARDIZEM) 30 MG tablet Take 1 tablet (30 mg total) by mouth 3 (three) times daily as needed (atrial fibrillation). 270 tablet 3  . dorzolamide (TRUSOPT) 2 % ophthalmic solution Place 1 drop into the left eye 2 (two)  times daily.  7  . feeding supplement, ENSURE ENLIVE, (ENSURE ENLIVE) LIQD Take 237 mLs by mouth 2 (two) times daily between meals. 237 mL 12  . flecainide (TAMBOCOR) 50 MG tablet Take 1.5 tablets (75 mg total) by mouth 2 (two) times daily.    . furosemide (LASIX) 20 MG tablet Take 1 tablet (20 mg total) by mouth as needed for fluid (shortness of breath). 30 tablet 6  . levothyroxine (SYNTHROID, LEVOTHROID) 75 MCG tablet TAKE 1 TABLET (75 MCG TOTAL) BY MOUTH DAILY. 30 tablet 0  . OXYGEN Inhale 2 L into the lungs. [place] - when exercising, walking around, and at night    . potassium chloride (K-DUR) 10 MEQ tablet Take 1 tablet (10 mEq total) by mouth as needed (take with Lasix). 30 tablet 6  . Respiratory Therapy Supplies (FLUTTER) DEVI Use as directed 1 each 0  . predniSONE (DELTASONE) 50 MG tablet Take 1 tablet for 3 days and stop (Patient not taking: Reported on 05/02/2017) 3 tablet 0  . sildenafil (REVATIO) 20 MG tablet Take 1 tablet (20 mg total) by mouth See admin instructions. 3 times a week. Takes to help her heart pump more blood to her lungs (Patient not taking: Reported on 05/02/2017)     No facility-administered medications prior to visit.     Review of Systems;  Patient denies headache, fevers, malaise, unintentional weight loss, skin rash, eye pain, sinus congestion and  sinus pain, sore throat, dysphagia,  hemoptysis , cough, dyspnea, wheezing, chest pain, palpitations, orthopnea, edema, abdominal pain, nausea, melena, diarrhea, constipation, flank pain, dysuria, hematuria, urinary  Frequency, nocturia, numbness, tingling, seizures,  Focal weakness, Loss of consciousness,  Tremor, insomnia, depression, anxiety, and suicidal ideation.      Objective:  BP (!) 118/56 (BP Location: Left Arm, Patient Position: Sitting, Cuff Size: Normal)   Pulse 97   Temp 98.4 F (36.9 C) (Oral)   Resp 17   Ht 5' 8"  (1.727 m)   Wt 115 lb 9.6 oz (52.4 kg)   SpO2 92%   BMI 17.58 kg/m   BP Readings  from Last 3 Encounters:  05/02/17 (!) 118/56  04/27/17 124/64  04/26/17 99/88    Wt Readings from Last 3 Encounters:  05/02/17 115 lb 9.6 oz (52.4 kg)  04/27/17 115 lb 9.6 oz (52.4 kg)  04/24/17 112 lb 4.8 oz (50.9 kg)    General appearance: alert, cooperative and appears stated age Ears: normal TM's and external ear canals both ears Throat: lips, mucosa, and tongue normal; teeth and gums normal Neck: no adenopathy, no carotid bruit, supple, symmetrical, trachea midline and thyroid not enlarged, symmetric, no tenderness/mass/nodules Back: symmetric, no curvature. ROM normal. No CVA tenderness. Lungs: clear to auscultation bilaterally Heart: regular rate and rhythm, S1, S2 normal, no murmur, click, rub or gallop Abdomen: soft, non-tender; bowel sounds normal; no masses,  no organomegaly Pulses: 2+ and symmetric Skin: Skin color, texture, turgor normal. No rashes or lesions Lymph nodes: Cervical, supraclavicular, and axillary nodes normal.  Lab Results  Component Value Date   HGBA1C 5.8 (H) 11/22/2014    Lab Results  Component Value Date   CREATININE 0.75 05/02/2017   CREATININE 0.75 04/26/2017   CREATININE 0.70 04/25/2017    Lab Results  Component Value Date   WBC 15.7 (H) 05/02/2017   HGB 12.3 05/02/2017   HCT 37.7 05/02/2017   PLT 299.0 05/02/2017   GLUCOSE 83 05/02/2017   CHOL 177 01/22/2016   TRIG 78.0 01/22/2016   HDL 53.50 01/22/2016   LDLCALC 108 (H) 01/22/2016   ALT 21 04/25/2017   AST 19 04/25/2017   NA 140 05/02/2017   K 4.1 05/02/2017   CL 102 05/02/2017   CREATININE 0.75 05/02/2017   BUN 19 05/02/2017   CO2 33 (H) 05/02/2017   TSH 3.18 02/16/2017   INR 1.22 06/14/2016   HGBA1C 5.8 (H) 11/22/2014    Dg Chest Port 1 View  Result Date: 04/24/2017 CLINICAL DATA:  83 year old female with known chronic lung infection and difficulty breathing EXAM: PORTABLE CHEST 1 VIEW COMPARISON:  CT 02/17/2017, plain film 06/14/2016 FINDINGS: Cardiomediastinal  silhouette unchanged in size and contour. Heart borders partially obscured by overlying lung and pleural disease. Hyperinflation of the lungs with flattened hemidiaphragms on the AP view. Mixed interstitial and airspace opacities in a similar distribution to the comparison chest x-ray and CT study, including focal opacities of the bilateral apices, right suprahilar and upper lung, projecting over the left heart border, and the bilateral bases. Background reticular opacity throughout the lungs. No new confluent airspace disease identified. No large pleural effusion. No pneumothorax. IMPRESSION: Similar appearance of chronic lung changes, architectural distortion, and nodular opacities in this patient with known chronic lung infection/MAC. Superimposed acute pneumonia difficult to exclude and correlation with lab values may be useful. Electronically Signed   By: Corrie Mckusick D.O.   On: 04/24/2017 09:39    Assessment & Plan:   Problem List  Items Addressed This Visit    Mouth pain    Suspect early thrush .  Patient advised to keep an eye on her symptoms and will start fluconazole if she develops progression       Leukocytosis - Primary    Presumed secondary to prednisone,  improved on repeat assessment  today.  Lab Results  Component Value Date   WBC 15.7 (H) 05/02/2017   HGB 12.3 05/02/2017   HCT 37.7 05/02/2017   MCV 96.1 05/02/2017   PLT 299.0 05/02/2017         Relevant Orders   CBC with Differential/Platelet (Completed)   Hypoxemia requiring supplemental oxygen    Her ambulatory sats drop to 87% on 2 L. In the office today.  Advised to increase her supplemental oxygen to 3L with ambulation      Hospital discharge follow-up    Patient is stable post discharge and has no new issues or questions about discharge plans at the visit today for hospital follow up. All labs , imaging studies and progress notes from admission were reviewed with patient today        Anorexia    Trial of  Remeron for appetite        Other Visit Diagnoses    Hypocalcemia       Relevant Orders   Basic metabolic panel (Completed)      I have discontinued Ms. Balogh's sildenafil and predniSONE. I am also having her start on mirtazapine. Additionally, I am having her maintain her FLUTTER, OXYGEN, diltiazem, apixaban, furosemide, potassium chloride, budesonide, arformoterol, levothyroxine, flecainide, dorzolamide, and feeding supplement (ENSURE ENLIVE).  Meds ordered this encounter  Medications  . mirtazapine (REMERON) 15 MG tablet    Sig: Take 1 tablet (15 mg total) by mouth at bedtime.    Dispense:  30 tablet    Refill:  2    Medications Discontinued During This Encounter  Medication Reason  . predniSONE (DELTASONE) 50 MG tablet Therapy completed  . sildenafil (REVATIO) 20 MG tablet Patient has not taken in last 30 days    Follow-up: Return in about 4 weeks (around 05/30/2017).   Crecencio Mc, MD

## 2017-05-03 DIAGNOSIS — Z09 Encounter for follow-up examination after completed treatment for conditions other than malignant neoplasm: Secondary | ICD-10-CM | POA: Insufficient documentation

## 2017-05-03 DIAGNOSIS — D72829 Elevated white blood cell count, unspecified: Secondary | ICD-10-CM | POA: Insufficient documentation

## 2017-05-03 DIAGNOSIS — K1379 Other lesions of oral mucosa: Secondary | ICD-10-CM | POA: Insufficient documentation

## 2017-05-03 LAB — BASIC METABOLIC PANEL
BUN: 19 mg/dL (ref 6–23)
CALCIUM: 8.8 mg/dL (ref 8.4–10.5)
CHLORIDE: 102 meq/L (ref 96–112)
CO2: 33 meq/L — AB (ref 19–32)
CREATININE: 0.75 mg/dL (ref 0.40–1.20)
GFR: 78.47 mL/min (ref >60.00)
GLUCOSE: 83 mg/dL (ref 70–99)
Potassium: 4.1 mEq/L (ref 3.5–5.1)
Sodium: 140 mEq/L (ref 135–145)

## 2017-05-03 LAB — CBC WITH DIFFERENTIAL/PLATELET
BASOS ABS: 0.1 10*3/uL (ref 0.0–0.1)
Basophils Relative: 0.3 % (ref 0.0–3.0)
EOS PCT: 2 % (ref 0.0–5.0)
Eosinophils Absolute: 0.3 10*3/uL (ref 0.0–0.7)
HEMATOCRIT: 37.7 % (ref 36.0–46.0)
HEMOGLOBIN: 12.3 g/dL (ref 12.0–15.0)
LYMPHS ABS: 1.2 10*3/uL (ref 0.7–4.0)
MCHC: 32.7 g/dL (ref 30.0–36.0)
MCV: 96.1 fl (ref 78.0–100.0)
MONOS PCT: 10.1 % (ref 3.0–12.0)
Monocytes Absolute: 1.6 10*3/uL — ABNORMAL HIGH (ref 0.1–1.0)
Neutro Abs: 12.6 10*3/uL — ABNORMAL HIGH (ref 1.4–7.7)
Neutrophils Relative %: 80.1 % — ABNORMAL HIGH (ref 43.0–77.0)
Platelets: 299 10*3/uL (ref 150.0–400.0)
RBC: 3.93 Mil/uL (ref 3.87–5.11)
RDW: 14.2 % (ref 11.5–15.5)
WBC: 15.7 10*3/uL — ABNORMAL HIGH (ref 4.0–10.5)

## 2017-05-03 NOTE — Assessment & Plan Note (Signed)
Suspect early thrush .  Patient advised to keep an eye on her symptoms and will start fluconazole if she develops progression

## 2017-05-03 NOTE — Assessment & Plan Note (Signed)
Presumed secondary to prednisone,  improved on repeat assessment  today.  Lab Results  Component Value Date   WBC 15.7 (H) 05/02/2017   HGB 12.3 05/02/2017   HCT 37.7 05/02/2017   MCV 96.1 05/02/2017   PLT 299.0 05/02/2017

## 2017-05-03 NOTE — Assessment & Plan Note (Signed)
Trial of Remeron for appetite

## 2017-05-03 NOTE — Assessment & Plan Note (Signed)
Her ambulatory sats drop to 87% on 2 L. In the office today.  Advised to increase her supplemental oxygen to 3L with ambulation

## 2017-05-03 NOTE — Assessment & Plan Note (Signed)
Patient is stable post discharge and has no new issues or questions about discharge plans at the visit today for hospital follow up. All labs , imaging studies and progress notes from admission were reviewed with patient today   

## 2017-05-04 ENCOUNTER — Encounter: Payer: Self-pay | Admitting: Internal Medicine

## 2017-05-05 ENCOUNTER — Encounter: Payer: Self-pay | Admitting: Internal Medicine

## 2017-05-05 ENCOUNTER — Ambulatory Visit (INDEPENDENT_AMBULATORY_CARE_PROVIDER_SITE_OTHER): Payer: Medicare Other | Admitting: Internal Medicine

## 2017-05-05 VITALS — BP 110/62 | HR 70 | Ht 68.0 in | Wt 114.0 lb

## 2017-05-05 DIAGNOSIS — R0602 Shortness of breath: Secondary | ICD-10-CM

## 2017-05-05 DIAGNOSIS — I480 Paroxysmal atrial fibrillation: Secondary | ICD-10-CM | POA: Diagnosis not present

## 2017-05-05 DIAGNOSIS — I951 Orthostatic hypotension: Secondary | ICD-10-CM | POA: Diagnosis not present

## 2017-05-05 NOTE — Patient Instructions (Addendum)
Medication Instructions: - Your physician recommends that you continue on your current medications as directed. Please refer to the Current Medication list given to you today.  Labwork: - none ordered  Procedures/Testing: - none ordered  Follow-Up: - Your physician recommends that you schedule a follow-up appointment in: 3-4 months with Dr. Klein.   Any Additional Special Instructions Will Be Listed Below (If Applicable).     If you need a refill on your cardiac medications before your next appointment, please call your pharmacy.   

## 2017-05-05 NOTE — Progress Notes (Signed)
Patient Care Team: Crecencio Mc, MD as PCP - General (Internal Medicine) Minna Merritts, MD as Consulting Physician (Cardiology)   HPI  Hannah Kline is a 83 y.o. female Seen in follow-up for paroxysmal atrial fibrillation in the context of severe lung disease with remote MAI and recurring problems with bronchiectasis.  Antiarrhythmics Date  amiodarone stopped/   sotalol failure    She had been managed with flecainide; a concern however had arisen as to whether he might be causing shortness of breath something with which I do not familiar but have been reported. We undertook a transient discontinuation trial use of propafenone but she is ended up back on flecainide which she is tolerating quite well along with her apixoban.  She was hospitalized last week for an aggravation of her bronchiectasis; she says that she was out of rhythm. I don't find documentation cardiology was not consulted. ECGs from the ER not in chart.  In any case she has felt much better the last couple of days with her breathing back at her baseline  DATE PR interval QRSduration Dose-Flecainide   11/14  214 106 50  6/18 198 110 75  \   Records and Results Reviewed ER records inpatient telemetry records;  inpatient electrocardiograms were not available for review  Past Medical History:  Diagnosis Date  . Atrial fibrillation ([place])   . Bacterial infection due to Pseudomonas   . Bronchiectasis   . COPD (chronic obstructive pulmonary disease) ([place])    . Cough    from bronchiectasis neb txs  . Dysrhythmia    A Fib  . Hepatitis    h/o INH(isoniazide)  . History of Mycobacterium avium complex infection    lung disease  . Hypothyroidism   . Pleurisy 12/08/11  . Shortness of breath dyspnea     Past Surgical History:  Procedure Laterality Date  . CATARACT EXTRACTION W/PHACO Left 01/26/2016   Procedure: CATARACT EXTRACTION PHACO AND INTRAOCULAR LENS PLACEMENT (IOC);  Surgeon: Ronnell Freshwater, MD;  Location: [facility];  Service: Ophthalmology;  Laterality: Left;  . COLONOSCOPY    . SHOULDER ARTHROSCOPY     right    Current Outpatient Prescriptions  Medication Sig Dispense Refill  . apixaban (ELIQUIS) 2.5 MG TABS tablet Take 1 tablet (2.5 mg total) by mouth 2 (two) times daily. 180 tablet 3  . arformoterol (BROVANA) 15 MCG/2ML NEBU Take 2 mLs (15 mcg total) by nebulization 2 (two) times daily. 120 mL 11  . budesonide (PULMICORT) 0.5 MG/2ML nebulizer solution Take 2 mLs (0.5 mg total) by nebulization 2 (two) times daily. 120 mL 11  . diltiazem (CARDIZEM) 30 MG tablet Take 1 tablet (30 mg total) by mouth 3 (three) times daily as needed (atrial fibrillation). 270 tablet 3  . dorzolamide (TRUSOPT) 2 % ophthalmic solution Place 1 drop into the left eye 2 (two) times daily.  7  . feeding supplement, ENSURE ENLIVE, (ENSURE ENLIVE) LIQD Take 237 mLs by mouth 2 (two) times daily between meals. 237 mL 12  . flecainide (TAMBOCOR) 50 MG tablet Take 1.5 tablets (75 mg total) by mouth 2 (two) times daily.    . furosemide (LASIX) 20 MG tablet Take 1 tablet (20 mg total) by mouth as needed for fluid (shortness of breath). 30 tablet 6  . levothyroxine (SYNTHROID, LEVOTHROID) 75 MCG tablet TAKE 1 TABLET (75 MCG TOTAL) BY MOUTH DAILY. 30 tablet 0  . OXYGEN Inhale 2 L into the lungs. [place] -  when exercising, walking around, and at night    . potassium chloride (K-DUR) 10 MEQ tablet Take 1 tablet (10 mEq total) by mouth as needed (take with Lasix). 30 tablet 6  . Respiratory Therapy Supplies (FLUTTER) DEVI Use as directed 1 each 0  . mirtazapine (REMERON) 15 MG tablet Take 1 tablet (15 mg total) by mouth at bedtime. (Patient not taking: Reported on 05/05/2017) 30 tablet 2   No current facility-administered medications for this visit.     Allergies  Allergen Reactions  . Ciprofloxacin Other (See Comments)    c-diff  . Isoniazid     Hepatitis, weakness, nausea  . Moxifloxacin      Avelox  REACTION: chills, fainting      Review of Systems negative except from HPI and PMH  Physical Exam BP 110/62 (BP Location: Left Arm, Patient Position: Sitting, Cuff Size: Normal)   Pulse 70   Ht _0  (1.727 m)   Wt 114 lb (51.7 kg)   BMI 17.33 kg/m  Well developed and nourished in no acute distress wearing oxygen HENT normal Neck supple with JVP-flat Carotids brisk and full without bruits Diffuse crackles Regular rate and rhythm, no murmurs or gallops Abd-soft with active BS without hepatomegaly No Clubbing cyanosis edema Skin-warm and dry A & Oriented  Grossly normal sensory and motor function  ECG demonstrates sinus rhythm at 70 Intervals 20/11/42 Incomplete right bundle branch block and left anterior fascicular block   Assessment and  Plan  Atrial fibrillation-paroxysmal  Bronchiectasis-oxygen dependent COPD  We discussed strategies for her recurrent atrial fibrillation. I suggested that she has not reverted by the second morning she should call and we should undertake semi-urgent cardioversion. She tolerates her atrial fibrillation poorly On Anticoagulation;  No bleeding issues   We spent more than 50% of our >25 min visit in face to face counseling regarding the above      Current medicines are reviewed at length with the patient today .  The patient does not  have concerns regarding medicines.

## 2017-05-06 ENCOUNTER — Telehealth: Payer: Self-pay | Admitting: *Deleted

## 2017-05-06 ENCOUNTER — Telehealth: Payer: Self-pay

## 2017-05-06 ENCOUNTER — Telehealth: Payer: Self-pay | Admitting: Internal Medicine

## 2017-05-06 MED ORDER — MICONAZOLE NITRATE 100 MG VA SUPP: 100.0000 mg | Freq: Every day | VAGINAL | 0 refills | Status: DC

## 2017-05-06 MED ORDER — FLUCONAZOLE 150 MG PO TABS: 150.0000 mg | ORAL_TABLET | Freq: Every day | ORAL | 0 refills | Status: DC

## 2017-05-06 NOTE — Telephone Encounter (Signed)
Pharmacy called just a few minutes ago and stated that fluconazole has a drug interaction with one of her medications prescribed by Dr. Candis Musa called propafenone. Pharmacist stated that it could cause a heart attack in pt. Did not see this medication in pt's med list so wanted to let you before they filled the medication.

## 2017-05-06 NOTE — Telephone Encounter (Signed)
Please advise 

## 2017-05-06 NOTE — Telephone Encounter (Signed)
Pt had OV with Dr. Caryl Comes yesterday. Per his notes: "We discussed strategies for her recurrent atrial fibrillation. I suggested that she has not reverted by the second morning she should call and we should undertake semi-urgent cardioversion. She tolerates her atrial fibrillation poorly On Anticoagulation;  No bleeding issues "  Attempted to contact pt. No answer, no VM. Will call again.

## 2017-05-06 NOTE — Telephone Encounter (Signed)
Patient was advised to call the office if she needed an antibiotic for a yeast infection. Pt requested to have fluconazole called into [facility] [address] Symptoms: vaginal itching and discharge.  Pt contact [phone]

## 2017-05-06 NOTE — Telephone Encounter (Signed)
Notified pharmacy.

## 2017-05-06 NOTE — Telephone Encounter (Signed)
S/w pt who reports she woke in afib this morning but resolved around 4pm.   She takes flecainide BID and she took one PRN diltiazem today. States she feels much better.  She will continue to monitor and call back if afib is sustained.

## 2017-05-06 NOTE — Telephone Encounter (Signed)
FLUCONAZOLE SENT to [facility] dr

## 2017-05-06 NOTE — Telephone Encounter (Signed)
Please call pt at [phone]

## 2017-05-06 NOTE — Telephone Encounter (Signed)
Spoke with pt and informed her that Dr. Derrel Nip sent her in a medication for her yeast infection.

## 2017-05-06 NOTE — Telephone Encounter (Signed)
Pt states she is in afib today, and would like to proceed with the cardioversion

## 2017-05-06 NOTE — Telephone Encounter (Signed)
LMTCB

## 2017-05-06 NOTE — Telephone Encounter (Signed)
Ok,  Tell them not to fill it  She will have to use a vaginal suppository for  Treatment, which I have called in: it's called  miconazole.

## 2017-05-10 ENCOUNTER — Other Ambulatory Visit: Payer: Medicare Other

## 2017-05-10 DIAGNOSIS — J471 Bronchiectasis with (acute) exacerbation: Secondary | ICD-10-CM

## 2017-05-13 ENCOUNTER — Other Ambulatory Visit: Payer: Medicare Other

## 2017-05-13 ENCOUNTER — Telehealth: Payer: Self-pay | Admitting: Radiology

## 2017-05-13 DIAGNOSIS — J471 Bronchiectasis with (acute) exacerbation: Secondary | ICD-10-CM

## 2017-05-13 NOTE — Telephone Encounter (Signed)
PT husband dropped off 2nd sputum sample at [facility]. Don't see order for this lab and lab staff here doesn't know what is suppose to be released. 1st sputum sample that was sent off was released as Respiratory Sputum Culture. Please clarify orders and place orders that are suppose to be sent off.

## 2017-05-14 NOTE — Telephone Encounter (Signed)
Order routine culture, fungus culture, afb culture

## 2017-05-15 LAB — RESPIRATORY CULTURE OR RESPIRATORY AND SPUTUM CULTURE

## 2017-05-16 NOTE — Telephone Encounter (Signed)
Orders have been placed per BQ. Will route message back to [place].

## 2017-05-16 NOTE — Addendum Note (Signed)
Addended by: Desmond Dike C on: 05/16/2017 09:25 AM   Modules accepted: Orders

## 2017-05-16 NOTE — Telephone Encounter (Signed)
Thanks

## 2017-05-17 ENCOUNTER — Other Ambulatory Visit: Payer: Medicare Other

## 2017-05-17 DIAGNOSIS — J471 Bronchiectasis with (acute) exacerbation: Secondary | ICD-10-CM | POA: Diagnosis not present

## 2017-05-18 NOTE — Telephone Encounter (Signed)
Mailed unread message to patient.  

## 2017-05-19 ENCOUNTER — Other Ambulatory Visit: Payer: Self-pay

## 2017-05-19 DIAGNOSIS — E039 Hypothyroidism, unspecified: Secondary | ICD-10-CM

## 2017-05-19 MED ORDER — LEVOTHYROXINE SODIUM 75 MCG PO TABS: 75.0000 ug | ORAL_TABLET | Freq: Every day | ORAL | 0 refills | Status: DC

## 2017-05-21 LAB — RESPIRATORY CULTURE OR RESPIRATORY AND SPUTUM CULTURE

## 2017-05-24 ENCOUNTER — Telehealth: Payer: Self-pay | Admitting: Internal Medicine

## 2017-05-24 DIAGNOSIS — L509 Urticaria, unspecified: Secondary | ICD-10-CM

## 2017-05-24 NOTE — Telephone Encounter (Signed)
Pt called and stated that she went to see a dermatology on 03/10/17 and was diagnosed with hives. Pt wants to know if there is anyway to figure out what is causing them. Please advise, thank you!  Call pt @ [phone]

## 2017-05-24 NOTE — Telephone Encounter (Signed)
Please advise 

## 2017-05-24 NOTE — Telephone Encounter (Signed)
I can send her for allergy testing , but that would be the only thing I can advise without seeing her and taking a careful history

## 2017-05-24 NOTE — Telephone Encounter (Signed)
Pt is scheduled for 7/31. Need orders please.

## 2017-05-25 NOTE — Telephone Encounter (Signed)
LMTCB

## 2017-05-26 DIAGNOSIS — H401492 Capsular glaucoma with pseudoexfoliation of lens, unspecified eye, moderate stage: Secondary | ICD-10-CM | POA: Diagnosis not present

## 2017-05-26 NOTE — Telephone Encounter (Signed)
LMTCB

## 2017-05-26 NOTE — Telephone Encounter (Signed)
Spoke with pt and she stated that she is willing to try the allergy testing.

## 2017-05-26 NOTE — Telephone Encounter (Signed)
  Your referral is in process as requested for allergy testing

## 2017-06-02 ENCOUNTER — Telehealth: Payer: Self-pay | Admitting: Cardiovascular Disease

## 2017-06-02 ENCOUNTER — Ambulatory Visit: Payer: Self-pay | Admitting: Cardiovascular Disease

## 2017-06-02 NOTE — Telephone Encounter (Signed)
Patient came in today for appointment with Dr. Rockey Situ and her appointment was changed because it was too soon. She was due for a 6 month follow up so they moved it out to November. Let her know that they would send her a letter to reschedule closer to that time. She verbalized understanding and stated that her breathing has been giving her some problems. She is on nebulizer treatments and she reported that she did a sputum culture. She was not sure if those results were back. Advised her to call her pulmonary physician about the results and if her breathing continues to be a problem for her. Apologized for the appointment mix up and let her know to please call if she needs anything prior to that appointment. She verbalized understanding of our conversation with no further questions at this time.

## 2017-06-02 NOTE — Telephone Encounter (Signed)
Patient came to office for an appt but its was cancelled (too soon)   Patient wants to see Rockey Situ as she is having sob continued   Pt c/o Shortness Of Breath: STAT if SOB developed within the last 24 hours or pt is noticeably SOB on the phone  1. Are you currently SOB (can you hear that pt is SOB on the phone)?  Yes kind of   2. How long have you been experiencing SOB?  Since last visit   3. Are you SOB when sitting or when up moving around? All the time worse with exertion   4. Are you currently experiencing any other symptoms?  Bad appetite   Patient is not sure if sob condition should be or is going to be improved please call.

## 2017-06-02 NOTE — Telephone Encounter (Signed)
Left voicemail message to call back  

## 2017-06-02 NOTE — Telephone Encounter (Signed)
Pt returning our call °Please call back ° °

## 2017-06-06 ENCOUNTER — Telehealth: Payer: Self-pay | Admitting: Radiology

## 2017-06-06 NOTE — Telephone Encounter (Signed)
None for me

## 2017-06-06 NOTE — Telephone Encounter (Signed)
Pt is coming in tomorrow for labs, are there labs that you need pt to have or is the pt coming for Dr. Anastasia Pall labs? If you need pt to have labs for you, please place future orders. Thank you.

## 2017-06-07 ENCOUNTER — Other Ambulatory Visit: Payer: Medicare Other

## 2017-06-07 NOTE — Telephone Encounter (Signed)
Pt did not want [place] to draw labs since there were no orders. It looks like she should have been scheduled for a 4 wk f/u with you & not the lab appt that [facility] scheduled.

## 2017-06-07 NOTE — Telephone Encounter (Signed)
Pt came in today & states that she was scheduled to come in today for labs. She stated that you mentioned that her WBC was elevated on the last check & she needed to have it rechecked.

## 2017-06-09 ENCOUNTER — Encounter: Payer: Self-pay | Admitting: Pulmonary Disease

## 2017-06-09 ENCOUNTER — Ambulatory Visit (INDEPENDENT_AMBULATORY_CARE_PROVIDER_SITE_OTHER): Payer: Medicare Other | Admitting: Pulmonary Disease

## 2017-06-09 VITALS — BP 130/64 | HR 69 | Ht 68.0 in | Wt 111.0 lb

## 2017-06-09 DIAGNOSIS — I4891 Unspecified atrial fibrillation: Secondary | ICD-10-CM | POA: Diagnosis not present

## 2017-06-09 DIAGNOSIS — R0602 Shortness of breath: Secondary | ICD-10-CM

## 2017-06-09 DIAGNOSIS — J479 Bronchiectasis, uncomplicated: Secondary | ICD-10-CM

## 2017-06-09 DIAGNOSIS — J9611 Chronic respiratory failure with hypoxia: Secondary | ICD-10-CM | POA: Diagnosis not present

## 2017-06-09 NOTE — Patient Instructions (Signed)
For your chronic respiratory failure with hypoxemia: We will check your oxygen level while you are walking today When you are walking use___ liters of oxygen per minute Keep sleeping with oxygen at night  For your bronchiectasis: Keep using a flutter valve or exercise twice a day to help bring up mucus Continue Brovana and Pulmicort Lab me know if you have fever, worsening weight loss or increasing shortness of breath or increasing chest congestion  We will see you back in 6-8 weeks or sooner

## 2017-06-09 NOTE — Progress Notes (Signed)
Subjective:    Patient ID: Hannah Kline, female    DOB: 1932-09-22, 83 y.o.   MRN: [id number] Synopsis: [name] established care with the [facility] pulmonary office in February 2013 for bronchiectasis due to [place]. She was diagnosed at age 24 in [place]. She was treated with rifampin ethambutol and clarithromycin for 2 years. Since then she does not believe she was ever grown out MAI.  She had c.diff colitis in 2015 which was refractory to treatment.    HPI  Chief Complaint  Patient presents with  . Follow-up    pt states she is doing well, no new complaints.     Ahnesti says that she is doing terribly.  She feels crumby most of the time.  She says that she feels extremely breathless.  She feels much worse than normal.  She doesn't feel chest congestion any more than normal.  She coughs normally all day.  She is slowing down in her exercises. She has to stop in the middle of a workout.  She is very sleepy.  No antibiotics since the last visit.  Her weight is down a little.  Her appetite isn't great.  She eats but the weight doesn't stay on.  She is using and benefitting from her oxygen regularly.  She uses it when she is moving around.  She sleeps with it.  She is checking her oxygen saturation, typically around 95% when she is up moving around. Her afib hasn't been any worse.    In general she feels like when she takes antibiotics they wipe her out for a while.    Past Medical History:  Diagnosis Date  . Atrial fibrillation ([place])   . Bacterial infection due to Pseudomonas   . Bronchiectasis   . COPD (chronic obstructive pulmonary disease) ([place])    . Cough    from bronchiectasis neb txs  . Dysrhythmia    A Fib  . Hepatitis    h/o INH(isoniazide)  . History of Mycobacterium avium complex infection    lung disease  . Hypothyroidism   . Pleurisy 12/08/11  . Shortness of breath dyspnea      Review of Systems  Constitutional: Negative for appetite  change, chills, fatigue, fever and unexpected weight change.  HENT: Negative for congestion, ear pain and nosebleeds.   Respiratory: Negative for cough, choking and shortness of breath.   Cardiovascular: Negative for chest pain and leg swelling.       Objective:   Physical Exam Vitals:   06/09/17 0908  BP: 130/64  Pulse: 69  SpO2: 97%  Weight: 111 lb (50.3 kg)  Height: 5' 8"  (1.727 m)   2L  Walked 400 feet on 2L, O2 saturation 94%  Gen: frail, chronically ill appearing HENT: OP clear, TM's clear, neck supple PULM: Crackles throughout B, normal percussion CV: RRR, no mgr, trace edema GI: BS+, soft, nontender Derm: no cyanosis or rash Psyche: normal mood and affect        Micro Review of 2012 sputum microbiology: Pan sensitive pseudomonas  10/11/2012 Sputum: PSEUDOMONAS AERUGINOSA       Antibiotic  Sensitivity  Microscan  Status    CEFEPIME  Sensitive  2  Final    CEFTAZIDIME  Sensitive  <=1  Final    CIPROFLOXACIN  Intermediate  2  Final    GENTAMICIN  Sensitive  <=1  Final    IMIPENEM  Sensitive  1  Final    LEVOFLOXACIN  Intermediate  4  Final    PIP/TAZO  Sensitive  <=4  Final    TOBRAMYCIN  Sensitive  <=1  Final    January 2014 Sputum AFB > neg x3   2015 Sputum> Pseudomonas , Cipro/Levofloxacin Intermediate suscept; Cefepime,Ceftaz, Imi, Pip-Tazo, Gent all susceptible  September 2015 sputum culture> Klebsiella resistant to amoxicillin but susceptible to Augmentin and multiple other agents  January - February 2016 Sputum AFB negative x 3  12/2015 AFB sputum > MAI 03/2016 AFB sputum > negative 05/2017 Sputum culture > Pseudomonas, pan sensitive  PFT 09/2010 Full PFT: Ratio 63%, FEV1 1.49L (67% pred) December 2013 simple spirometry FEV1 0.8 L January 2014 simple spirometry >> not reproducible by ATS standards 11/2013 FEV1 0.93 L April 2018 simple spirometry ratio 61%, FEV1 0.67 L 30% predicted, FVC 1.0 936% predicted  Imaging 10/2011 CT Chest:  impressive upper lobe cystic bronchiectasis, scattered lower lobe tree-in-bud abnormalities and scattered nodules; One RML solid nodule measures 7.48m  January 2013 CT chest [facility]: Stable upper lobe cystic bronchiectasis also present in the right middle lobe. Scattered tree in bud abnormalities and nodules throughout all lung fields roughly unchanged from prior. Right middle lobe nodule is no longer visible but there is an 877mnodule in the right upper lobe.  June 2018 high-resolution CT scan of the chest images independently reviewed showing tree in bud abnormalities and cylindrical bronchiectasis in the upper lobes with some nodules noted, findings worse in the right middle lobe and the lingula with subsegmental atelectasis in the right upper lobe. There is a nodule in the right base which is not changed. Overall impression is consistent with bronchiectasis and findings worrisome for an atypical lung infection like Mycobacterium avium intercellular.     Assessment & Plan:  Chronic respiratory failure with hypoxia (HCC)  Bronchiectasis without complication (HCC)  [place] type [place] Shortness of breath  Discussion: I'm concerned about GrBenitaShe's not had a flareup since the last visit but in general her symptoms are worsening. Sometimes it's hard to know how much the disease is bothering her because of her concomitant depression influencing her description of her symptoms. However, her weight is back down again and in general I believe that she's nearing end-stage chronic respiratory failure secondary to severe bronchiectasis. I've encouraged her to keep her spirits up and to stay active and to try to eat as healthy as possible. I don't think or going to gain much ground from further lung function testing or adding medical therapy. We just need to keep surveilling her for chronic infections (AFB cultures are pending right now) and check her oxygen level with ambulation when  she returns to clinic frequently to make sure that she is adequately oxygenated. We talked about scheduled antibiotics but given all the trouble she's had with C. difficile in the past she's not interested in this.  Plan: For your chronic respiratory failure with hypoxemia: We will check your oxygen level while you are walking today When you are walking use 2 liters of oxygen per minute Keep sleeping with oxygen at night  For your bronchiectasis: Keep using a flutter valve or exercise twice a day to help bring up mucus Continue Brovana and Pulmicort Lab me know if you have fever, worsening weight loss or increasing shortness of breath or increasing chest congestion  We will see you back in 6-8 weeks or sooner    Current Outpatient Prescriptions:  .  apixaban (ELIQUIS) 2.5 MG TABS tablet, Take 1 tablet (2.5 mg total) by  mouth 2 (two) times daily., Disp: 180 tablet, Rfl: 3 .  arformoterol (BROVANA) 15 MCG/2ML NEBU, Take 2 mLs (15 mcg total) by nebulization 2 (two) times daily., Disp: 120 mL, Rfl: 11 .  budesonide (PULMICORT) 0.5 MG/2ML nebulizer solution, Take 2 mLs (0.5 mg total) by nebulization 2 (two) times daily., Disp: 120 mL, Rfl: 11 .  diltiazem (CARDIZEM) 30 MG tablet, Take 1 tablet (30 mg total) by mouth 3 (three) times daily as needed (atrial fibrillation)., Disp: 270 tablet, Rfl: 3 .  dorzolamide (TRUSOPT) 2 % ophthalmic solution, Place 1 drop into the left eye 2 (two) times daily., Disp: , Rfl: 7 .  feeding supplement, ENSURE ENLIVE, (ENSURE ENLIVE) LIQD, Take 237 mLs by mouth 2 (two) times daily between meals., Disp: 237 mL, Rfl: 12 .  flecainide (TAMBOCOR) 50 MG tablet, Take 1.5 tablets (75 mg total) by mouth 2 (two) times daily., Disp: , Rfl:  .  furosemide (LASIX) 20 MG tablet, Take 1 tablet (20 mg total) by mouth as needed for fluid (shortness of breath)., Disp: 30 tablet, Rfl: 6 .  levothyroxine (SYNTHROID, LEVOTHROID) 75 MCG tablet, Take 1 tablet (75 mcg total) by mouth  daily., Disp: 30 tablet, Rfl: 0 .  OXYGEN, Inhale 2 L into the lungs. [place] - when exercising, walking around, and at night, Disp: , Rfl:  .  Respiratory Therapy Supplies (FLUTTER) DEVI, Use as directed, Disp: 1 each, Rfl: 0 .  potassium chloride (K-DUR) 10 MEQ tablet, Take 1 tablet (10 mEq total) by mouth as needed (take with Lasix)., Disp: 30 tablet, Rfl: 6

## 2017-06-10 ENCOUNTER — Ambulatory Visit: Payer: Self-pay | Admitting: Pulmonary Disease

## 2017-06-15 LAB — FUNGUS CULTURE W SMEAR

## 2017-06-20 ENCOUNTER — Other Ambulatory Visit: Payer: Self-pay | Admitting: Internal Medicine

## 2017-06-20 DIAGNOSIS — E039 Hypothyroidism, unspecified: Secondary | ICD-10-CM

## 2017-06-28 LAB — AFB CULTURE WITH SMEAR (NOT AT ARMC)

## 2017-06-30 LAB — AFB CULTURE WITH SMEAR (NOT AT ARMC)

## 2017-07-05 DIAGNOSIS — R05 Cough: Secondary | ICD-10-CM | POA: Diagnosis not present

## 2017-07-05 DIAGNOSIS — L501 Idiopathic urticaria: Secondary | ICD-10-CM | POA: Diagnosis not present

## 2017-07-14 DIAGNOSIS — C44519 Basal cell carcinoma of skin of other part of trunk: Secondary | ICD-10-CM | POA: Diagnosis not present

## 2017-07-14 DIAGNOSIS — L501 Idiopathic urticaria: Secondary | ICD-10-CM | POA: Diagnosis not present

## 2017-07-14 DIAGNOSIS — D485 Neoplasm of uncertain behavior of skin: Secondary | ICD-10-CM | POA: Diagnosis not present

## 2017-07-19 ENCOUNTER — Other Ambulatory Visit: Payer: Self-pay

## 2017-07-19 ENCOUNTER — Telehealth: Payer: Self-pay | Admitting: Emergency Medicine

## 2017-07-19 DIAGNOSIS — E039 Hypothyroidism, unspecified: Secondary | ICD-10-CM

## 2017-07-19 MED ORDER — LEVOTHYROXINE SODIUM 75 MCG PO TABS: 75.0000 ug | ORAL_TABLET | Freq: Every day | ORAL | 0 refills | Status: DC

## 2017-07-19 NOTE — Telephone Encounter (Signed)
Per TP: okay for Doxycycline 100mg  #14, 1 po BID x7 days.  Take with food, eat a yogurt every day.  Use sunscreen and avoid sun exposure.  Please call the office if symptoms do not improve or they worsen for sooner follow up.  Called spoke with patient to discuss TP's recommendations.  Pt stated that she is "really against taking another medication" and feels that she is on too many medications as it is.  Pt stated that she has been taking Zyrtec daily and recently stopped this is able to tell a noticeable difference in the way she feels.  Pt again stated that she does not want to take an additional medication, even though it will only be short-term.  Pt advised to continue to monitor her symptoms and to call the office if symptoms worsen.  Otherwise pt stated she will keep her 9.20.18 appt with TP.  Nothing further needed; will sign off.

## 2017-07-19 NOTE — Telephone Encounter (Signed)
Spoke with patient to change the 9.14.18 appt with TP d/t the pending storm - appt has been rescheduled to 9.20.18, this date d/t pt's transportation.  While speaking with patient she mentioned some increased SOB, prod cough with yellow or white mucus x10days.  Pt denies any wheezing, tightness, hemoptysis, chest pain.  She has been taking her maintenance medications as prescribed.  She questions if the weather attributes to her symptoms and is asking for recommendations to hold her until her next appt.  Allergies  Allergen Reactions  . Ciprofloxacin Other (See Comments)    c-diff  . Isoniazid     Hepatitis, weakness, nausea  . Moxifloxacin     Avelox  REACTION: chills, fainting   [facility] [place] [facility] [address]  BQ is off this afternoon - will forward to DOD TP please advise, thank you.

## 2017-07-22 ENCOUNTER — Ambulatory Visit: Payer: Self-pay | Admitting: Adult Health

## 2017-07-22 ENCOUNTER — Telehealth: Payer: Self-pay | Admitting: Internal Medicine

## 2017-07-22 NOTE — Telephone Encounter (Signed)
PT LVM STATING THAT [facility] TOLD HER THAT SHE CAN NOT RENEW HER LEVOTHYROXINE. SHE STATES THAT SHE NEEDS IT AND WANTS TO KNOW WHAT WE CAN DO TO GET THIS REFILLED FOR HER. PT CB [phone]

## 2017-07-22 NOTE — Telephone Encounter (Signed)
LMTCB.   Called [facility] and they said she picked this prescription up 07/22/2017 at 11:50am.

## 2017-07-27 ENCOUNTER — Encounter: Payer: Self-pay | Admitting: Internal Medicine

## 2017-07-27 ENCOUNTER — Ambulatory Visit (INDEPENDENT_AMBULATORY_CARE_PROVIDER_SITE_OTHER): Payer: Medicare Other | Admitting: Internal Medicine

## 2017-07-27 VITALS — BP 142/74 | HR 80 | Temp 98.1°F | Resp 17 | Ht 68.0 in | Wt 110.6 lb

## 2017-07-27 DIAGNOSIS — R5383 Other fatigue: Secondary | ICD-10-CM | POA: Diagnosis not present

## 2017-07-27 DIAGNOSIS — Z9981 Dependence on supplemental oxygen: Secondary | ICD-10-CM | POA: Diagnosis not present

## 2017-07-27 DIAGNOSIS — L509 Urticaria, unspecified: Secondary | ICD-10-CM | POA: Diagnosis not present

## 2017-07-27 DIAGNOSIS — E039 Hypothyroidism, unspecified: Secondary | ICD-10-CM | POA: Diagnosis not present

## 2017-07-27 DIAGNOSIS — K1379 Other lesions of oral mucosa: Secondary | ICD-10-CM

## 2017-07-27 DIAGNOSIS — R0902 Hypoxemia: Secondary | ICD-10-CM

## 2017-07-27 DIAGNOSIS — R5381 Other malaise: Secondary | ICD-10-CM

## 2017-07-27 MED ORDER — MONTELUKAST SODIUM 10 MG PO TABS: 10.0000 mg | ORAL_TABLET | Freq: Every day | ORAL | 3 refills | Status: DC

## 2017-07-27 MED ORDER — CYANOCOBALAMIN 1000 MCG/ML IJ SOLN
1000.0000 ug | Freq: Once | INTRAMUSCULAR | Status: AC
  Administered 2017-07-27: 1000 ug via INTRAMUSCULAR

## 2017-07-27 MED ORDER — NYSTATIN 100000 UNIT/ML MT SUSP: 5.0000 mL | Freq: Four times a day (QID) | OROMUCOSAL | 0 refills | Status: DC

## 2017-07-27 NOTE — Patient Instructions (Addendum)
You can use the nystatin suspension 4 times daily for one week to treat the thrush trying to form on your tongue   Tell the pharmacist you are not taking propafenone    For the hives:   Try taking claritin  (loratadine ) 10 mg daily  And famotidine  20 mg twice daily (generic Pepcid)  If no results  We can add  Singulair  (leukotriene) 10 mg daily

## 2017-07-27 NOTE — Progress Notes (Signed)
Subjective:  Patient ID: Hannah Kline, female    DOB: 1932-07-01  Age: 83 y.o. MRN: [id number]  CC: The primary encounter diagnosis was Fatigue, unspecified type. Diagnoses of Malaise and fatigue, Mouth pain, Hypoxemia requiring supplemental oxygen, Localized hives, and Acquired hypothyroidism were also pertinent to this visit.  HPI Hannah Kline presents for follow up on multiple  issues:  1) subacute onset of generalized weakness,  Short of breath ( "I feel 100% terriblL.  e")  Has increased her use of supplemental oxygen to 3 Using 3 L/min.  sats were 90% on 3 L after walking down hallway,  After sitting without 02 sats came up to 93%. She feels short of breath just walking through the house,  Has been symptomatic for the past month .  Has a chronic  productive cough,  Which has not changed, No change in sputum.    No fevers or body aches.  No sick contacts   2) Persistent hives .  Occur on shoulders,  Upper back and hairline at nape of neck.  Treated one year ago by dermatologist  with an expensive  compounded cream which did not work,  Sought a second dermatology opinion,  Was  biopsied by [facility] .  Hives diagnosed.  allergy testing was negative .  Was Given mometasone which calms the itch .  Not tolerating trial of zyrtec du eto sedation  Has not tried claritin,  [employer].    3) Tongue is sore.   At last visit in june she was given rx for fluconazole  But C/I by pharmacist because pharmacist  thought she was taking propafenone ( which was stopped over 6 months ago,  Actually taking flecainide) so  she  used a vaginal suppository  For the vaginals symptoms (miconostat) .  Discussed trial of nystatin suspension 4 times daily   4) weight loss:  Poor appetite but forces herself to eat a full meal. Gets a  Protein for each meal.  Has lost 4 lbs.    Outpatient Medications Prior to Visit  Medication Sig Dispense Refill  . apixaban (ELIQUIS) 2.5 MG TABS tablet Take 1 tablet (2.5 mg  total) by mouth 2 (two) times daily. 180 tablet 3  . arformoterol (BROVANA) 15 MCG/2ML NEBU Take 2 mLs (15 mcg total) by nebulization 2 (two) times daily. 120 mL 11  . budesonide (PULMICORT) 0.5 MG/2ML nebulizer solution Take 2 mLs (0.5 mg total) by nebulization 2 (two) times daily. 120 mL 11  . diltiazem (CARDIZEM) 30 MG tablet Take 1 tablet (30 mg total) by mouth 3 (three) times daily as needed (atrial fibrillation). 270 tablet 3  . dorzolamide (TRUSOPT) 2 % ophthalmic solution Place 1 drop into the left eye 2 (two) times daily.  7  . feeding supplement, ENSURE ENLIVE, (ENSURE ENLIVE) LIQD Take 237 mLs by mouth 2 (two) times daily between meals. 237 mL 12  . flecainide (TAMBOCOR) 50 MG tablet Take 1.5 tablets (75 mg total) by mouth 2 (two) times daily.    . furosemide (LASIX) 20 MG tablet Take 1 tablet (20 mg total) by mouth as needed for fluid (shortness of breath). 30 tablet 6  . levothyroxine (SYNTHROID, LEVOTHROID) 75 MCG tablet Take 1 tablet (75 mcg total) by mouth daily. 90 tablet 0  . OXYGEN Inhale 2 L into the lungs. [place] - when exercising, walking around, and at night    . potassium chloride (K-DUR) 10 MEQ tablet Take 1 tablet (10 mEq total) by mouth as  needed (take with Lasix). 30 tablet 6  . Respiratory Therapy Supplies (FLUTTER) DEVI Use as directed 1 each 0   No facility-administered medications prior to visit.     Review of Systems;  Patient denies headache, fevers,  eye pain, sinus congestion and sinus pain, sore throat, dysphagia,  hemoptysis ,  wheezing, chest pain, palpitations, orthopnea, edema, abdominal pain, nausea, melena, diarrhea, constipation, flank pain, dysuria, hematuria, urinary  Frequency, nocturia, numbness, tingling, seizures,  Focal weakness, Loss of consciousness,  Tremor, insomnia, depression, anxiety, and suicidal ideation.      Objective:  BP (!) 142/74 (BP Location: Left Arm, Patient Position: Sitting, Cuff Size: Normal)   Pulse 80   Temp 98.1 F (36.7  C) (Oral)   Resp 17   Ht _0  (1.727 m)   Wt 110 lb 9.6 oz (50.2 kg)   SpO2 93%   BMI 16.82 kg/m   BP Readings from Last 3 Encounters:  07/28/17 126/62  07/27/17 (!) 142/74  06/09/17 130/64    Wt Readings from Last 3 Encounters:  07/28/17 110 lb 6.4 oz (50.1 kg)  07/27/17 110 lb 9.6 oz (50.2 kg)  06/09/17 111 lb (50.3 kg)    General appearance: alert, cooperative and appears stated age Ears: normal TM's and external ear canals both ears Throat: lips, mucosa, and tongue normal; teeth and gums normal Neck: no adenopathy, no carotid bruit, supple, symmetrical, trachea midline and thyroid not enlarged, symmetric, no tenderness/mass/nodules Back: symmetric, no curvature. ROM normal. No CVA tenderness. Lungs: clear to auscultation bilaterally Heart: regular rate and rhythm, S1, S2 normal, no murmur, click, rub or gallop Abdomen: soft, non-tender; bowel sounds normal; no masses,  no organomegaly Pulses: 2+ and symmetric Skin: Skin color, texture, turgor normal. No rashes or lesions Lymph nodes: Cervical, supraclavicular, and axillary nodes normal.  Lab Results  Component Value Date   HGBA1C 5.8 (H) 11/22/2014    Lab Results  Component Value Date   CREATININE 0.82 07/27/2017   CREATININE 0.75 05/02/2017   CREATININE 0.75 04/26/2017    Lab Results  Component Value Date   WBC 14.1 (H) 07/27/2017   HGB 13.5 07/27/2017   HCT 41.3 07/27/2017   PLT 306.0 07/27/2017   GLUCOSE 88 07/27/2017   CHOL 177 01/22/2016   TRIG 78.0 01/22/2016   HDL 53.50 01/22/2016   LDLCALC 108 (H) 01/22/2016   ALT 13 07/27/2017   AST 19 07/27/2017   NA 138 07/27/2017   K 5.1 07/27/2017   CL 100 07/27/2017   CREATININE 0.82 07/27/2017   BUN 22 07/27/2017   CO2 32 07/27/2017   TSH 3.55 07/27/2017   INR 1.22 06/14/2016   HGBA1C 5.8 (H) 11/22/2014    Dg Chest Port 1 View  Result Date: 04/24/2017 CLINICAL DATA:  83 year old female with known chronic lung infection and difficulty breathing  EXAM: PORTABLE CHEST 1 VIEW COMPARISON:  CT 02/17/2017, plain film 06/14/2016 FINDINGS: Cardiomediastinal silhouette unchanged in size and contour. Heart borders partially obscured by overlying lung and pleural disease. Hyperinflation of the lungs with flattened hemidiaphragms on the AP view. Mixed interstitial and airspace opacities in a similar distribution to the comparison chest x-ray and CT study, including focal opacities of the bilateral apices, right suprahilar and upper lung, projecting over the left heart border, and the bilateral bases. Background reticular opacity throughout the lungs. No new confluent airspace disease identified. No large pleural effusion. No pneumothorax. IMPRESSION: Similar appearance of chronic lung changes, architectural distortion, and nodular opacities in this patient with  known chronic lung infection/MAC. Superimposed acute pneumonia difficult to exclude and correlation with lab values may be useful. Electronically Signed   By: Corrie Mckusick D.O.   On: 04/24/2017 09:39    Assessment & Plan:   Problem List Items Addressed This Visit    Acquired hypothyroidism    Thyroid function is WNL on current dose.  No current changes needed.   Lab Results  Component Value Date   TSH 3.55 07/27/2017         Hypoxemia requiring supplemental oxygen    Secondary to bronchiectasis. She has increased her supplemental rate to 3L/min with exertion and sats here in the office did not drop below 90% with ambulation       Localized hives    Chronic, daily recurrent,  Proven by biopsy per patient by [facility].  Advised to stop zyrtec and try claritin or allegra plus H2 blocker bid (cimetidine, famotidine or ranitidine),  Will add singulair if no improvement.       Malaise and fatigue    Multifactorial, primarily secondary to chronic hypoxemia ,  Hives ill controlled with zyrtec, and oral pain.  Normal thyroid function ,  B12,  Cbc  Etc.     Lab Results  Component Value Date     TSH 3.55 07/27/2017   Lab Results  Component Value Date   VITAMINB12 1,138 (H) 07/27/2017   Lab Results  Component Value Date   WBC 14.1 (H) 07/27/2017   HGB 13.5 07/27/2017   HCT 41.3 07/27/2017   MCV 97.0 07/27/2017   PLT 306.0 07/27/2017         Mouth pain    Will treat for early thrush with nystatin suspension        Other Visit Diagnoses    Fatigue, unspecified type    -  Primary   Relevant Medications   cyanocobalamin ((VITAMIN B-12)) injection 1,000 mcg (Completed)   Other Relevant Orders   B12 (Completed)   CBC with Differential/Platelet (Completed)   Folate RBC (Completed)   Comprehensive metabolic panel (Completed)   TSH (Completed)      I have discontinued Ms. Handlin's montelukast. I am also having her start on nystatin. Additionally, I am having her maintain her FLUTTER, OXYGEN, diltiazem, apixaban, furosemide, potassium chloride, budesonide, arformoterol, flecainide, dorzolamide, feeding supplement (ENSURE ENLIVE), and levothyroxine. We administered cyanocobalamin.  Meds ordered this encounter  Medications  . nystatin (MYCOSTATIN) 100000 UNIT/ML suspension    Sig: Take 5 mLs (500,000 Units total) by mouth 4 (four) times daily.    Dispense:  60 mL    Refill:  0  . DISCONTD: montelukast (SINGULAIR) 10 MG tablet    Sig: Take 1 tablet (10 mg total) by mouth at bedtime.    Dispense:  30 tablet    Refill:  3  . cyanocobalamin ((VITAMIN B-12)) injection 1,000 mcg    Medications Discontinued During This Encounter  Medication Reason  . montelukast (SINGULAIR) 10 MG tablet     Follow-up: No Follow-up on file.   Crecencio Mc, MD

## 2017-07-28 ENCOUNTER — Encounter: Payer: Self-pay | Admitting: Internal Medicine

## 2017-07-28 ENCOUNTER — Encounter: Payer: Self-pay | Admitting: Adult Health

## 2017-07-28 ENCOUNTER — Ambulatory Visit (INDEPENDENT_AMBULATORY_CARE_PROVIDER_SITE_OTHER): Payer: Medicare Other | Admitting: Adult Health

## 2017-07-28 DIAGNOSIS — Z9981 Dependence on supplemental oxygen: Secondary | ICD-10-CM

## 2017-07-28 DIAGNOSIS — R0902 Hypoxemia: Secondary | ICD-10-CM

## 2017-07-28 DIAGNOSIS — J471 Bronchiectasis with (acute) exacerbation: Secondary | ICD-10-CM

## 2017-07-28 LAB — CBC WITH DIFFERENTIAL/PLATELET
BASOS PCT: 1.2 % (ref 0.0–3.0)
Basophils Absolute: 0.2 10*3/uL — ABNORMAL HIGH (ref 0.0–0.1)
EOS ABS: 0.7 10*3/uL (ref 0.0–0.7)
EOS PCT: 4.7 % (ref 0.0–5.0)
HCT: 41.3 % (ref 36.0–46.0)
Hemoglobin: 13.5 g/dL (ref 12.0–15.0)
LYMPHS PCT: 10 % — AB (ref 12.0–46.0)
Lymphs Abs: 1.4 10*3/uL (ref 0.7–4.0)
MCHC: 32.6 g/dL (ref 30.0–36.0)
MCV: 97 fl (ref 78.0–100.0)
MONOS PCT: 8.8 % (ref 3.0–12.0)
Monocytes Absolute: 1.2 10*3/uL — ABNORMAL HIGH (ref 0.1–1.0)
NEUTROS ABS: 10.6 10*3/uL — AB (ref 1.4–7.7)
NEUTROS PCT: 75.3 % (ref 43.0–77.0)
Platelets: 306 10*3/uL (ref 150.0–400.0)
RBC: 4.26 Mil/uL (ref 3.87–5.11)
RDW: 14.8 % (ref 11.5–15.5)
WBC: 14.1 10*3/uL — AB (ref 4.0–10.5)

## 2017-07-28 LAB — FOLATE RBC: RBC FOLATE: 933 ng/mL (ref >280)

## 2017-07-28 LAB — COMPREHENSIVE METABOLIC PANEL
ALT: 13 U/L (ref 0–35)
AST: 19 U/L (ref 0–37)
Albumin: 4 g/dL (ref 3.5–5.2)
Alkaline Phosphatase: 82 U/L (ref 39–117)
BILIRUBIN TOTAL: 0.4 mg/dL (ref 0.2–1.2)
BUN: 22 mg/dL (ref 6–23)
CO2: 32 meq/L (ref 19–32)
CREATININE: 0.82 mg/dL (ref 0.40–1.20)
Calcium: 9.5 mg/dL (ref 8.4–10.5)
Chloride: 100 mEq/L (ref 96–112)
GFR: 70.75 mL/min (ref >60.00)
GLUCOSE: 88 mg/dL (ref 70–99)
Potassium: 5.1 mEq/L (ref 3.5–5.1)
Sodium: 138 mEq/L (ref 135–145)
Total Protein: 7.4 g/dL (ref 6.0–8.3)

## 2017-07-28 LAB — VITAMIN B12: Vitamin B-12: 1138 pg/mL — ABNORMAL HIGH (ref 211–911)

## 2017-07-28 LAB — TSH: TSH: 3.55 u[IU]/mL (ref 0.35–4.50)

## 2017-07-28 NOTE — Progress Notes (Signed)
@Patient  ID: Hannah Kline, female    DOB: [birth year], 83 y.o.   MRN: [id number]  Chief Complaint  Patient presents with  . Follow-up    Referring provider: Crecencio Mc, MD  HPI: 83 year old female followed for bronchiectasis due to MAI and chronic respiratory failure on oxygen  TEST    2015 Sputum> Pseudomonas , Cipro/Levofloxacin Intermediate suscept; Cefepime,Ceftaz, Imi, Pip-Tazo, Gent all susceptible  September 2015 sputum culture> Klebsiella resistant to amoxicillin but susceptible to Augmentin and multiple other agents  January - February 2016 Sputum AFB negative x 3  12/2015 AFB sputum > MAI 03/2016 AFB sputum > negative 05/2017 Sputum culture > Pseudomonas, pan sensitive  PFT 09/2010 Full PFT: Ratio 63%, FEV1 1.49L (67% pred) December 2013 simple spirometry FEV1 0.8 L January 2014 simple spirometry >> not reproducible by ATS standards 11/2013 FEV1 0.93 L April 2018 simple spirometry ratio 61%, FEV1 0.67 L 30% predicted, FVC 1.0 936% predicted  Imaging 10/2011 CT Chest: impressive upper lobe cystic bronchiectasis, scattered lower lobe tree-in-bud abnormalities and scattered nodules; One RML solid nodule measures 7.65m  January 2013 CT chest [facility]: Stable upper lobe cystic bronchiectasis also present in the right middle lobe. Scattered tree in bud abnormalities and nodules throughout all lung fields roughly unchanged from prior. Right middle lobe nodule is no longer visible but there is an 870mnodule in the right upper lobe.  June 2018 high-resolution CT scan of the chest images independently reviewed showing tree in bud abnormalities and cylindrical bronchiectasis in the upper lobes with some nodules noted, findings worse in the right middle lobe and the lingula with subsegmental atelectasis in the right upper lobe. There is a nodule in the right base which is not changed. Overall impression is consistent with bronchiectasis and findings worrisome for an atypical  lung infection like Mycobacterium avium intercellular.  07/28/2017 Follow up Bronchiectasis  Patient returns for a 6 week follow-up. Overall says her breathing is doing about the same. She has good and bad days. She tries to stay active. She denies any increased cough or congestion. She's not using her flutter valve on a regular basis. She remains on provide a Pulmicort twice daily. She does use Mucinex, most days. She remains on oxygen 2 L.. Marland Kitchenppetite is fair. Her weight has been stable. Going to the symphony tonight   Allergies  Allergen Reactions  . Ciprofloxacin Other (See Comments)    c-diff  . Isoniazid     Hepatitis, weakness, nausea  . Moxifloxacin     Avelox  REACTION: chills, fainting    Immunization History  Administered Date(s) Administered  . Influenza Split 08/07/2012, 08/14/2013, 09/04/2015  . Influenza Whole 08/08/2010, 07/10/2011, 08/18/2011  . Influenza, High Dose Seasonal PF 09/02/2016  . Influenza-Unspecified 08/29/2014  . Pneumococcal Conjugate-13 01/07/2014  . Pneumococcal Polysaccharide-23 08/08/2010  . Tdap 07/09/2009    Past Medical History:  Diagnosis Date  . [place]  . Bacterial infection due to Pseudomonas   . Bronchiectasis   . COPD (chronic obstructive pulmonary disease) ([facility]   . Cough    from bronchiectasis neb txs  . Dysrhythmia    A Fib  . Hepatitis    h/o INH(isoniazide)  . History of Mycobacterium avium complex infection    lung disease  . Hypothyroidism   . Pleurisy 12/08/11  . Shortness of breath dyspnea     Tobacco History: History  Smoking Status  . Never Smoker  Smokeless Tobacco  . Never Used   Counseling given:  Not Answered   Outpatient Encounter Prescriptions as of 07/28/2017  Medication Sig  . apixaban (ELIQUIS) 2.5 MG TABS tablet Take 1 tablet (2.5 mg total) by mouth 2 (two) times daily.  Marland Kitchen arformoterol (BROVANA) 15 MCG/2ML NEBU Take 2 mLs (15 mcg total) by nebulization 2 (two) times daily.  .  budesonide (PULMICORT) 0.5 MG/2ML nebulizer solution Take 2 mLs (0.5 mg total) by nebulization 2 (two) times daily.  Marland Kitchen diltiazem (CARDIZEM) 30 MG tablet Take 1 tablet (30 mg total) by mouth 3 (three) times daily as needed (atrial fibrillation).  . dorzolamide (TRUSOPT) 2 % ophthalmic solution Place 1 drop into the left eye 2 (two) times daily.  . feeding supplement, ENSURE ENLIVE, (ENSURE ENLIVE) LIQD Take 237 mLs by mouth 2 (two) times daily between meals.  . flecainide (TAMBOCOR) 50 MG tablet Take 1.5 tablets (75 mg total) by mouth 2 (two) times daily.  Marland Kitchen levothyroxine (SYNTHROID, LEVOTHROID) 75 MCG tablet Take 1 tablet (75 mcg total) by mouth daily.  Marland Kitchen nystatin (MYCOSTATIN) 100000 UNIT/ML suspension Take 5 mLs (500,000 Units total) by mouth 4 (four) times daily.  . OXYGEN Inhale 2 L into the lungs. [place] - when exercising, walking around, and at night  . Respiratory Therapy Supplies (FLUTTER) DEVI Use as directed  . furosemide (LASIX) 20 MG tablet Take 1 tablet (20 mg total) by mouth as needed for fluid (shortness of breath).  . potassium chloride (K-DUR) 10 MEQ tablet Take 1 tablet (10 mEq total) by mouth as needed (take with Lasix).   No facility-administered encounter medications on file as of 07/28/2017.      Review of Systems  Constitutional:   No  weight loss, night sweats,  Fevers, chills, +fatigue, or  lassitude.  HEENT:   No headaches,  Difficulty swallowing,  Tooth/dental problems, or  Sore throat,                No sneezing, itching, ear ache, nasal congestion, post nasal drip,   CV:  No chest pain,  Orthopnea, PND, swelling in lower extremities, anasarca, dizziness, palpitations, syncope.   GI  No heartburn, indigestion, abdominal pain, nausea, vomiting, diarrhea, change in bowel habits, loss of appetite, bloody stools.   Resp: No shortness of breath with exertion or at rest.  No excess mucus, no productive cough,  No non-productive cough,  No coughing up of blood.  No change  in color of mucus.  No wheezing.  No chest wall deformity  Skin: no rash or lesions.  GU: no dysuria, change in color of urine, no urgency or frequency.  No flank pain, no hematuria   MS:  No joint pain or swelling.  No decreased range of motion.  No back pain.    Physical Exam   GEN: A/Ox3; pleasant , NAD, thin frail elderly on o2    HEENT:  [place]/AT,  EACs-clear, TMs-wnl, NOSE-clear, THROAT-clear, no lesions, no postnasal drip or exudate noted.   NECK:  Supple w/ fair ROM; no JVD; normal carotid impulses w/o bruits; no thyromegaly or nodules palpated; no lymphadenopathy.    RESP  Few rhonchi , no accessory muscle use, no dullness to percussion  CARD:  RRR, no m/r/g, no peripheral edema, pulses intact, no cyanosis or clubbing.  GI:   Soft & nt; nml bowel sounds; no organomegaly or masses detected.   Musco: Warm bil, no deformities or joint swelling noted.   Neuro: alert, no focal deficits noted.    Skin: Warm, no lesions or rashes  Lab Results:  CBC  BMET  BNP No results found for: BNP  ProBNP No results found for: PROBNP  Imaging: No results found.   Assessment & Plan:   No problem-specific Assessment & Plan notes found for this encounter.     Rexene Edison, NP 07/28/2017

## 2017-07-28 NOTE — Patient Instructions (Signed)
Continue on Brovana and Pulmicort Neb Twice daily   Continue on Mucinex Twice daily   Continue on Oxygen 2l/m . Marland Kitchen  Get flu shot as planned.  Restart Flutter valve Three times a day  .  Follow up with Dr. Lake Bells in 3 months and As needed

## 2017-07-29 ENCOUNTER — Encounter: Payer: Self-pay | Admitting: Internal Medicine

## 2017-07-29 NOTE — Assessment & Plan Note (Signed)
Appears stable w/ no active flare   Plan  Patient Instructions  Continue on Brovana and Pulmicort Neb Twice daily   Continue on Mucinex Twice daily   Continue on Oxygen 2l/m . Marland Kitchen  Get flu shot as planned.  Restart Flutter valve Three times a day  .  Follow up with Dr. Lake Bells in 3 months and As needed

## 2017-07-29 NOTE — Assessment & Plan Note (Signed)
Cont on O2 .  

## 2017-07-30 DIAGNOSIS — L509 Urticaria, unspecified: Secondary | ICD-10-CM | POA: Insufficient documentation

## 2017-07-30 NOTE — Assessment & Plan Note (Signed)
Thyroid function is WNL on current dose.  No current changes needed.   Lab Results  Component Value Date   TSH 3.55 07/27/2017

## 2017-07-30 NOTE — Assessment & Plan Note (Signed)
Secondary to bronchiectasis. She has increased her supplemental rate to 3L/min with exertion and sats here in the office did not drop below 90% with ambulation

## 2017-07-30 NOTE — Assessment & Plan Note (Signed)
Chronic, daily recurrent,  Proven by biopsy per patient by Isens[place]in.  Advised to stop zyrtec and try claritin or allegra plus H2 blocker bid (cimetidine, famotidine or ranitidine),  Will add singulair if no improvement.

## 2017-07-30 NOTE — Assessment & Plan Note (Addendum)
Multifactorial, primarily secondary to chronic hypoxemia ,  Hives ill controlled with zyrtec, and oral pain.  Normal thyroid function ,  B12,  Cbc  Etc.     Lab Results  Component Value Date   TSH 3.55 07/27/2017   Lab Results  Component Value Date   VITAMINB12 1,138 (H) 07/27/2017   Lab Results  Component Value Date   WBC 14.1 (H) 07/27/2017   HGB 13.5 07/27/2017   HCT 41.3 07/27/2017   MCV 97.0 07/27/2017   PLT 306.0 07/27/2017

## 2017-07-30 NOTE — Assessment & Plan Note (Signed)
Will treat for early thrush with nystatin suspension

## 2017-07-31 ENCOUNTER — Telehealth: Payer: Self-pay | Admitting: Internal Medicine

## 2017-07-31 ENCOUNTER — Other Ambulatory Visit: Payer: Self-pay | Admitting: Internal Medicine

## 2017-07-31 DIAGNOSIS — L509 Urticaria, unspecified: Secondary | ICD-10-CM

## 2017-08-01 NOTE — Progress Notes (Signed)
Reviewed, agree 

## 2017-08-03 NOTE — Telephone Encounter (Signed)
See care note  

## 2017-08-04 ENCOUNTER — Ambulatory Visit (INDEPENDENT_AMBULATORY_CARE_PROVIDER_SITE_OTHER): Payer: Medicare Other | Admitting: Internal Medicine

## 2017-08-04 ENCOUNTER — Encounter: Payer: Self-pay | Admitting: Internal Medicine

## 2017-08-04 VITALS — BP 106/60 | HR 69 | Ht 68.0 in | Wt 108.5 lb

## 2017-08-04 DIAGNOSIS — I480 Paroxysmal atrial fibrillation: Secondary | ICD-10-CM | POA: Diagnosis not present

## 2017-08-04 DIAGNOSIS — I272 Pulmonary hypertension, unspecified: Secondary | ICD-10-CM

## 2017-08-04 DIAGNOSIS — R0602 Shortness of breath: Secondary | ICD-10-CM

## 2017-08-04 NOTE — Patient Instructions (Addendum)
Medication Instructions: - Your physician recommends that you continue on your current medications as directed. Please refer to the Current Medication list given to you today.  Labwork: - none ordered  Procedures/Testing: - none ordered  Follow-Up: - You have been referred to : CHF clinic (Dr. Bensimhon/ Dr. Aundra Dubin) for evaluation of Pulmonary Hypertension and further medications management. Their office will call you to set up an appointment.   - Your physician wants you to follow-up in: 6 months with Dr. Caryl Comes. You will receive a reminder letter in the mail two months in advance. If you don't receive a letter, please call our office to schedule the follow-up appointment.   Any Additional Special Instructions Will Be Listed Below (If Applicable).     If you need a refill on your cardiac medications before your next appointment, please call your pharmacy.

## 2017-08-04 NOTE — Progress Notes (Signed)
Patient Care Team: Crecencio Mc, MD as PCP - General (Internal Medicine) Minna Merritts, MD as Consulting Physician (Cardiology)   HPI  Hannah Kline is a 83 y.o. female Seen in follow-up for paroxysmal atrial fibrillation in the context of severe lung disease with remote MAI and recurring problems with bronchiectasis.  Antiarrhythmics Date  amiodarone stopped/   sotalol failure  flecainide         DATE PR interval QRSduration Dose-Flecainide   11/14  214 106 50  6/18 198 110 75  9/18 192 114 75  \ Functional status is gradually deteriorating. She was seen by pulmonary last week. These notes were reviewed. Continuation of current therapy was recommended.  She notes that she started taking sildenafil a few days ago previously prescribed. (Not on MAR she thinks she may feel a little bit better since she initiated at)    There has been no interval atrial fibrillation.  Echocardiogram from 418 was reviewed. LV function was normal PA pressures were elevated in the 60 range.  She is quite melancholy. She understands that her condition is progressive and it seems like she is struggling to wrap her mind around the reality of her deteriorating status. She admits to depression. She is however active in a variety of things including quilting.  Records and Results Reviewed ER records inpatient telemetry records;  inpatient electrocardiograms were not available for review  Past Medical History:  Diagnosis Date  . Atrial fibrillation ([place])   . Bacterial infection due to Pseudomonas   . Bronchiectasis   . COPD (chronic obstructive pulmonary disease) ([place])    . Cough    from bronchiectasis neb txs  . Dysrhythmia    A Fib  . Hepatitis    h/o INH(isoniazide)  . History of Mycobacterium avium complex infection    lung disease  . Hypothyroidism   . Pleurisy 12/08/11  . Shortness of breath dyspnea     Past Surgical History:  Procedure Laterality Date  . CATARACT  EXTRACTION W/PHACO Left 01/26/2016   Procedure: CATARACT EXTRACTION PHACO AND INTRAOCULAR LENS PLACEMENT (IOC);  Surgeon: Ronnell Freshwater, MD;  Location: [facility];  Service: Ophthalmology;  Laterality: Left;  . COLONOSCOPY    . SHOULDER ARTHROSCOPY     right    Current Outpatient Prescriptions  Medication Sig Dispense Refill  . apixaban (ELIQUIS) 2.5 MG TABS tablet Take 1 tablet (2.5 mg total) by mouth 2 (two) times daily. 180 tablet 3  . arformoterol (BROVANA) 15 MCG/2ML NEBU Take 2 mLs (15 mcg total) by nebulization 2 (two) times daily. 120 mL 11  . budesonide (PULMICORT) 0.5 MG/2ML nebulizer solution Take 2 mLs (0.5 mg total) by nebulization 2 (two) times daily. 120 mL 11  . diltiazem (CARDIZEM) 30 MG tablet Take 1 tablet (30 mg total) by mouth 3 (three) times daily as needed (atrial fibrillation). 270 tablet 3  . dorzolamide (TRUSOPT) 2 % ophthalmic solution Place 1 drop into the left eye 2 (two) times daily.  7  . feeding supplement, ENSURE ENLIVE, (ENSURE ENLIVE) LIQD Take 237 mLs by mouth 2 (two) times daily between meals. 237 mL 12  . flecainide (TAMBOCOR) 50 MG tablet Take 1.5 tablets (75 mg total) by mouth 2 (two) times daily.    . furosemide (LASIX) 20 MG tablet Take 1 tablet (20 mg total) by mouth as needed for fluid (shortness of breath). 30 tablet 6  . levothyroxine (SYNTHROID, LEVOTHROID) 75 MCG tablet Take 1 tablet (  75 mcg total) by mouth daily. 90 tablet 0  . nystatin (MYCOSTATIN) 100000 UNIT/ML suspension Take 5 mLs (500,000 Units total) by mouth 4 (four) times daily. 60 mL 0  . OXYGEN Inhale 2 L into the lungs. Levan - when exercising, walking around, and at night    . potassium chloride (K-DUR) 10 MEQ tablet Take 1 tablet (10 mEq total) by mouth as needed (take with Lasix). 30 tablet 6  . Respiratory Therapy Supplies (FLUTTER) DEVI Use as directed 1 each 0   No current facility-administered medications for this visit.     Allergies  Allergen Reactions    . Ciprofloxacin Other (See Comments)    c-diff  . Isoniazid     Hepatitis, weakness, nausea  . Moxifloxacin     Avelox  REACTION: chills, fainting      Review of Systems negative except from HPI and PMH  Physical Exam BP 106/60 (BP Location: Left Arm, Patient Position: Sitting, Cuff Size: Normal)   Pulse 69   Ht _0  (1.727 m)   Wt 108 lb 8 oz (49.2 kg)   SpO2 96%   BMI 16.50 kg/m  Well developed and cachectic in no acute distress HENT normal Neck supple with JVP-flat Carotids brisk and full without bruits Diffuse crackles Regular rate and rhythm, no murmurs or gallops Abd-soft with active BS without hepatomegaly No Clubbing cyanosis edema Skin-warm and dry A & Oriented  Grossly normal sensory and motor function   ECG demonstrates sinus rhythm at 69 19/11/43 Incomplete right bundle branch block and left anterior fascicular block   Assessment and  Plan  Atrial fibrillation-paroxysmal  Bronchiectasis-oxygen dependent COPD  Pulm HTN  End of LIfe discussion     We will continue her current strategy that is atrial fibrillation on the second day would prompt cardioversion.  With her history of pulmonary hypertension and the intermediate benefits to sildenafil, I have suggested that she might be better served by talking to somebody about her pulmonary hypertension with expertise in that area  We spoke about depression. This point she does not feel that this depressive would be of value. She is active with her [employer] friends.  We spent more than 50% of our >40 min visit in face to face counseling regarding the above      Current medicines are reviewed at length with the patient today .  The patient does not  have concerns regarding medicines.

## 2017-08-05 ENCOUNTER — Telehealth (HOSPITAL_COMMUNITY): Payer: Self-pay | Admitting: Vascular Surgery

## 2017-08-08 ENCOUNTER — Telehealth: Payer: Self-pay | Admitting: Internal Medicine

## 2017-08-08 NOTE — Telephone Encounter (Signed)
Ok have her keep appt

## 2017-08-08 NOTE — Telephone Encounter (Signed)
Spoke with Hannah Kline and she stated that she is getting worried about the rash that she has had for a little over a year. She stated that it is starting to spread down her arms, neck, and getting into her eyes. The Hannah Kline stated that she has seen a dermatologist here in [place] but would either like to come in a talk with you about it or get a referral to someone at [facility]. Scheduled the Hannah Kline an appt to see you on Thursday 08/11/2017 @ 8:30 because that was the only appt you had available for this week and wasn't sure if you would want to see her or not.

## 2017-08-08 NOTE — Telephone Encounter (Signed)
Does not need to be seen by me,  We dicussed her diagnosis of hives at her  recent visit and I recommended adding a once daily antihistamine  Plus adding  singulair if no improvement.  Has she tried daily claritin or allegra yet?

## 2017-08-08 NOTE — Telephone Encounter (Signed)
Pt called and stated that her hives are still spreading, She states that they are all over her arms and that it is starting to go into her bad eye. Please advise, thank you!  Call pt @ [phone] (leave msg)

## 2017-08-08 NOTE — Telephone Encounter (Signed)
LMTCB

## 2017-08-08 NOTE — Telephone Encounter (Signed)
Spoke with pt and she stated that she is taking the claritin and the singulair. Pt stated that she has been taking the claritin daily for a long time and she has been taking the singulair for about 4 days now.

## 2017-08-09 NOTE — Telephone Encounter (Signed)
Spoke with pt and she is aware that she needs to keep her appt this week. Pt is aware of appt date and time.

## 2017-08-11 ENCOUNTER — Ambulatory Visit (INDEPENDENT_AMBULATORY_CARE_PROVIDER_SITE_OTHER): Payer: Medicare Other | Admitting: Internal Medicine

## 2017-08-11 ENCOUNTER — Encounter: Payer: Self-pay | Admitting: Internal Medicine

## 2017-08-11 VITALS — BP 148/72 | HR 78 | Temp 97.5°F | Resp 17 | Ht 68.0 in | Wt 111.2 lb

## 2017-08-11 DIAGNOSIS — Z23 Encounter for immunization: Secondary | ICD-10-CM | POA: Diagnosis not present

## 2017-08-11 DIAGNOSIS — D72829 Elevated white blood cell count, unspecified: Secondary | ICD-10-CM

## 2017-08-11 DIAGNOSIS — L509 Urticaria, unspecified: Secondary | ICD-10-CM

## 2017-08-11 LAB — CBC WITH DIFFERENTIAL/PLATELET
BASOS ABS: 0.1 10*3/uL (ref 0.0–0.1)
Basophils Relative: 0.8 % (ref 0.0–3.0)
EOS ABS: 1.1 10*3/uL — AB (ref 0.0–0.7)
Eosinophils Relative: 9.9 % — ABNORMAL HIGH (ref 0.0–5.0)
HCT: 39 % (ref 36.0–46.0)
HEMOGLOBIN: 12.7 g/dL (ref 12.0–15.0)
Lymphocytes Relative: 10.5 % — ABNORMAL LOW (ref 12.0–46.0)
Lymphs Abs: 1.2 10*3/uL (ref 0.7–4.0)
MCHC: 32.5 g/dL (ref 30.0–36.0)
MCV: 96.2 fl (ref 78.0–100.0)
MONO ABS: 0.9 10*3/uL (ref 0.1–1.0)
Monocytes Relative: 8 % (ref 3.0–12.0)
NEUTROS PCT: 70.8 % (ref 43.0–77.0)
Neutro Abs: 8 10*3/uL — ABNORMAL HIGH (ref 1.4–7.7)
Platelets: 262 10*3/uL (ref 150.0–400.0)
RBC: 4.05 Mil/uL (ref 3.87–5.11)
RDW: 14.3 % (ref 11.5–15.5)
WBC: 11.4 10*3/uL — AB (ref 4.0–10.5)

## 2017-08-11 MED ORDER — PREDNISONE 10 MG PO TABS: ORAL_TABLET | ORAL | 0 refills | Status: DC

## 2017-08-11 MED ORDER — HYDROXYZINE HCL 25 MG PO TABS: 25.0000 mg | ORAL_TABLET | Freq: Three times a day (TID) | ORAL | 1 refills | Status: DC | PRN

## 2017-08-11 MED ORDER — MONTELUKAST SODIUM 10 MG PO TABS: ORAL_TABLET | ORAL | 3 refills | Status: DC

## 2017-08-11 NOTE — Patient Instructions (Addendum)
I am prescribing a medication called hydroxyzine  For the itching.  You should start it immediately and you can take it up to  three times daily  If no improvement in 3 days,  You can start the prednisone taper that I called in to [facility] on Sunday    Call Dr Donivan Scull office and ask them if they will prescribe an alternative to the Eliquis since you feel that itching has been worse since starting this medication

## 2017-08-11 NOTE — Progress Notes (Signed)
Subjective:  Patient ID: Hannah Kline, female    DOB: 1932-09-12  Age: 83 y.o. MRN: [id number]  CC: The primary encounter diagnosis was Leukocytosis, unspecified type. Diagnoses of Encounter for immunization and Urticarial dermatitis were also pertinent to this visit.  HPI:  Hannah Kline presents for evaluation of rash which by patient report is spreading from nape of neck and hairline to face ,  Scalp and arms.  Has been going on for 1.5 years.   Has taken flecainide for years  ,  elliquis was started in the past year ,  After the itching started but recalls that the itching became worse after Eliquis was started.   Diagnosed  with hives by biopsy done by [facility] on Sept 6  .  Using mometasone prescribed by allergist .  Not sure what she was tested for .    Worried that it is a reaction to flecainide.  Denies angioedema, flushing, diarrhea and syncope  Taking singulair and zyrtec daily with no improvement. .       Outpatient Medications Prior to Visit  Medication Sig Dispense Refill  . apixaban (ELIQUIS) 2.5 MG TABS tablet Take 1 tablet (2.5 mg total) by mouth 2 (two) times daily. 180 tablet 3  . arformoterol (BROVANA) 15 MCG/2ML NEBU Take 2 mLs (15 mcg total) by nebulization 2 (two) times daily. 120 mL 11  . budesonide (PULMICORT) 0.5 MG/2ML nebulizer solution Take 2 mLs (0.5 mg total) by nebulization 2 (two) times daily. 120 mL 11  . diltiazem (CARDIZEM) 30 MG tablet Take 1 tablet (30 mg total) by mouth 3 (three) times daily as needed (atrial fibrillation). 270 tablet 3  . dorzolamide (TRUSOPT) 2 % ophthalmic solution Place 1 drop into the left eye 2 (two) times daily.  7  . feeding supplement, ENSURE ENLIVE, (ENSURE ENLIVE) LIQD Take 237 mLs by mouth 2 (two) times daily between meals. 237 mL 12  . flecainide (TAMBOCOR) 50 MG tablet Take 1.5 tablets (75 mg total) by mouth 2 (two) times daily.    Marland Kitchen levothyroxine (SYNTHROID, LEVOTHROID) 75 MCG tablet Take 1 tablet (75 mcg total) by mouth  daily. 90 tablet 0  . nystatin (MYCOSTATIN) 100000 UNIT/ML suspension Take 5 mLs (500,000 Units total) by mouth 4 (four) times daily. 60 mL 0  . OXYGEN Inhale 2 L into the lungs. [place] - when exercising, walking around, and at night    . Respiratory Therapy Supplies (FLUTTER) DEVI Use as directed 1 each 0  . furosemide (LASIX) 20 MG tablet Take 1 tablet (20 mg total) by mouth as needed for fluid (shortness of breath). 30 tablet 6  . potassium chloride (K-DUR) 10 MEQ tablet Take 1 tablet (10 mEq total) by mouth as needed (take with Lasix). 30 tablet 6   No facility-administered medications prior to visit.     Review of Systems;  Patient denies headache, fevers, malaise, unintentional weight loss, skin rash, eye pain, sinus congestion and sinus pain, sore throat, dysphagia,  hemoptysis , cough, dyspnea, wheezing, chest pain, palpitations, orthopnea, edema, abdominal pain, nausea, melena, diarrhea, constipation, flank pain, dysuria, hematuria, urinary  Frequency, nocturia, numbness, tingling, seizures,  Focal weakness, Loss of consciousness,  Tremor, insomnia, depression, anxiety, and suicidal ideation.      Objective:  BP (!) 148/72 (BP Location: Left Arm, Patient Position: Sitting, Cuff Size: Normal)   Pulse 78   Temp (!) 97.5 F (36.4 C) (Oral)   Resp 17   Ht _0  (1.727 m)   Wt 111  lb 3.2 oz (50.4 kg)   SpO2 91% Comment: 2L O2  BMI 16.91 kg/m   BP Readings from Last 3 Encounters:  08/11/17 (!) 148/72  08/04/17 106/60  07/28/17 126/62    Wt Readings from Last 3 Encounters:  08/11/17 111 lb 3.2 oz (50.4 kg)  08/04/17 108 lb 8 oz (49.2 kg)  07/28/17 110 lb 6.4 oz (50.1 kg)    General appearance: alert, cooperative and appears stated age Neck: no adenopathy, no carotid bruit, supple, symmetrical, trachea midline and thyroid not enlarged, symmetric, no tenderness/mass/nodules Back: symmetric, no curvature. ROM normal. No CVA tenderness. Lungs: clear to auscultation  bilaterally Heart: regular rate and rhythm, S1, S2 normal, no murmur, click, rub or gallop Abdomen: soft, non-tender; bowel sounds normal; no masses,  no organomegaly Pulses: 2+ and symmetric Skin: multiple discrete lichenified papules starting at occipital hairline,  Covering back and arms s Lymph nodes: Cervical, supraclavicular, and axillary nodes normal.  Lab Results  Component Value Date   HGBA1C 5.8 (H) 11/22/2014    Lab Results  Component Value Date   CREATININE 0.82 07/27/2017   CREATININE 0.75 05/02/2017   CREATININE 0.75 04/26/2017    Lab Results  Component Value Date   WBC 11.4 (H) 08/11/2017   HGB 12.7 08/11/2017   HCT 39.0 08/11/2017   PLT 262.0 08/11/2017   GLUCOSE 88 07/27/2017   CHOL 177 01/22/2016   TRIG 78.0 01/22/2016   HDL 53.50 01/22/2016   LDLCALC 108 (H) 01/22/2016   ALT 13 07/27/2017   AST 19 07/27/2017   NA 138 07/27/2017   K 5.1 07/27/2017   CL 100 07/27/2017   CREATININE 0.82 07/27/2017   BUN 22 07/27/2017   CO2 32 07/27/2017   TSH 3.55 07/27/2017   INR 1.22 06/14/2016   HGBA1C 5.8 (H) 11/22/2014    Dg Chest Port 1 View  Result Date: 04/24/2017 CLINICAL DATA:  83 year old female with known chronic lung infection and difficulty breathing EXAM: PORTABLE CHEST 1 VIEW COMPARISON:  CT 02/17/2017, plain film 06/14/2016 FINDINGS: Cardiomediastinal silhouette unchanged in size and contour. Heart borders partially obscured by overlying lung and pleural disease. Hyperinflation of the lungs with flattened hemidiaphragms on the AP view. Mixed interstitial and airspace opacities in a similar distribution to the comparison chest x-ray and CT study, including focal opacities of the bilateral apices, right suprahilar and upper lung, projecting over the left heart border, and the bilateral bases. Background reticular opacity throughout the lungs. No new confluent airspace disease identified. No large pleural effusion. No pneumothorax. IMPRESSION: Similar  appearance of chronic lung changes, architectural distortion, and nodular opacities in this patient with known chronic lung infection/MAC. Superimposed acute pneumonia difficult to exclude and correlation with lab values may be useful. Electronically Signed   By: Corrie Mckusick D.O.   On: 04/24/2017 09:39    Assessment & Plan:   Problem List Items Addressed This Visit    Leukocytosis - Primary   Relevant Orders   CBC with Differential/Platelet (Completed)   Urticarial dermatitis    Unclear if her condition is chronic spontaneous urticaria  Or drug reaction.  ANA ordered but not done. No relief with 2nd generation antihistamine and leukotriene inhibitor.  Adding hydroxyzine for itching,  If no improvemement Will recommend she return to dermatology for consideration of xolair trial.       Relevant Orders   ANA   Sedimentation rate   C-reactive protein    Other Visit Diagnoses    Encounter for immunization  Relevant Orders   Flu vaccine HIGH DOSE PF (Completed)      I am having Ms. Calles start on hydrOXYzine and predniSONE. I am also having her maintain her FLUTTER, OXYGEN, diltiazem, apixaban, furosemide, potassium chloride, budesonide, arformoterol, flecainide, dorzolamide, feeding supplement (ENSURE ENLIVE), levothyroxine, nystatin, and montelukast.  Meds ordered this encounter  Medications  . DISCONTD: montelukast (SINGULAIR) 10 MG tablet    Sig: TAKE 1 TABLET BY MOUTH EVERYDAY AT BEDTIME    Refill:  3  . hydrOXYzine (ATARAX/VISTARIL) 25 MG tablet    Sig: Take 1 tablet (25 mg total) by mouth 3 (three) times daily as needed.    Dispense:  90 tablet    Refill:  1  . predniSONE (DELTASONE) 10 MG tablet    Sig: 6 tablets on Days 1 2, and 3, , then reduce by 1 tablet daily until gone    Dispense:  33 tablet    Refill:  0  . montelukast (SINGULAIR) 10 MG tablet    Sig: TAKE 1 TABLET BY MOUTH EVERYDAY AT BEDTIME    Dispense:  90 tablet    Refill:  3    Medications  Discontinued During This Encounter  Medication Reason  . montelukast (SINGULAIR) 10 MG tablet Reorder    Follow-up: No Follow-up on file.   Crecencio Mc, MD

## 2017-08-12 NOTE — Telephone Encounter (Signed)
Mailed unread message to patient.  

## 2017-08-13 NOTE — Assessment & Plan Note (Addendum)
Unclear if her condition is chronic spontaneous urticaria  Or drug reaction.  ANA ordered but not done. No relief with 2nd generation antihistamine and leukotriene inhibitor.  Adding hydroxyzine for itching,  If no improvemement Will recommend she return to dermatology for consideration of xolair trial.

## 2017-08-14 ENCOUNTER — Encounter: Payer: Self-pay | Admitting: Internal Medicine

## 2017-08-19 ENCOUNTER — Other Ambulatory Visit (INDEPENDENT_AMBULATORY_CARE_PROVIDER_SITE_OTHER): Payer: Medicare Other

## 2017-08-19 DIAGNOSIS — L509 Urticaria, unspecified: Secondary | ICD-10-CM

## 2017-08-19 NOTE — Progress Notes (Signed)
Called notified patient and she will be in this afternoon to repeat lab.

## 2017-08-19 NOTE — Addendum Note (Signed)
Addended by: Leeanne Rio on: 08/19/2017 03:26 PM   Modules accepted: Orders

## 2017-08-22 ENCOUNTER — Telehealth: Payer: Self-pay | Admitting: Internal Medicine

## 2017-08-22 NOTE — Telephone Encounter (Signed)
Patient wants to know if she can be temporarily taken of flecainide and switched to something else so that she can r/o this med as an allergy .  She has been having hives for over a year .    Please call to discuss

## 2017-08-23 ENCOUNTER — Telehealth: Payer: Self-pay | Admitting: Internal Medicine

## 2017-08-23 LAB — C-REACTIVE PROTEIN: CRP: 9.5 mg/L — ABNORMAL HIGH (ref <8.0)

## 2017-08-23 LAB — SEDIMENTATION RATE: SED RATE: 41 mm/h — AB (ref 0–30)

## 2017-08-23 LAB — ANA: Anti Nuclear Antibody(ANA): NEGATIVE

## 2017-08-23 MED ORDER — PROPAFENONE HCL 150 MG PO TABS: 150.0000 mg | ORAL_TABLET | Freq: Two times a day (BID) | ORAL | 6 refills | Status: DC

## 2017-08-23 NOTE — Telephone Encounter (Signed)
-----   Message from Minna Merritts, MD sent at 08/21/2017  1:47 PM EDT -----  Back in 03/2017 we switched from flecainide to propafenone for sx of SOB She switch back 2 weeks later reporting that it did not help her breathing We could try again for the hives, try propafenone 150 BID (she may have some at home) She failed other meds such as sotolol and amio  In terms of eliquis, We could try xarelto  Even pradaxa (as last resort, different mechanism)  Pam, can you call her and she if she is interested in any of these changes? Will cc Dr. Olin Pia thx TG  ----- Message ----- From: Crecencio Mc, MD Sent: 08/21/2017  11:53 AM To: Minna Merritts, MD  Hannah Kline has been having an awful time with hives (started 1.5 years ago per her) and she is concerned that the flecainide/Eliquis  may be causative.  Is there any way you would consider an alternative form of therpay for her so we can see if they resolve? se had a biopsy  ,  Hives was the diagnosis ([facility]).  Thanks,  Helene Kelp

## 2017-08-23 NOTE — Telephone Encounter (Signed)
Spoke with patient at length reviewing Dr. Donivan Scull recommendaitons. Instructed her to stop taking the flecainide and start on the Propafenone 150 mg twice a day to see if her hives go away or improve. Advised her to please call back if no change within a month and then we can look at other options for medication changes. She verbalized understanding of instructions, agreement with plan, and had no further questions at this time.

## 2017-08-23 NOTE — Telephone Encounter (Signed)
New message   Pt verbalized that she is calling to see if her new prescription for Propranolol was changed and called in

## 2017-08-23 NOTE — Telephone Encounter (Signed)
Spoke with patient and reviewed medication with her again and spelled it out for her to write down. Propafenone 150 mg twice a day. She verbalized understanding, was appreciative for the call, and had no further questions or concerns at this time.

## 2017-08-24 ENCOUNTER — Ambulatory Visit (INDEPENDENT_AMBULATORY_CARE_PROVIDER_SITE_OTHER): Payer: Medicare Other | Admitting: *Deleted

## 2017-08-24 DIAGNOSIS — E538 Deficiency of other specified B group vitamins: Secondary | ICD-10-CM | POA: Diagnosis not present

## 2017-08-24 MED ORDER — CYANOCOBALAMIN 1000 MCG/ML IJ SOLN
1000.0000 ug | Freq: Once | INTRAMUSCULAR | Status: AC
  Administered 2017-08-24: 1000 ug via INTRAMUSCULAR

## 2017-08-24 NOTE — Progress Notes (Signed)
Patient presented for B 12 injection to left deltoid, patient voiced no concerns nor showed any signs of distress during injection. 

## 2017-08-26 DIAGNOSIS — C44519 Basal cell carcinoma of skin of other part of trunk: Secondary | ICD-10-CM | POA: Diagnosis not present

## 2017-08-26 DIAGNOSIS — L905 Scar conditions and fibrosis of skin: Secondary | ICD-10-CM | POA: Diagnosis not present

## 2017-08-29 NOTE — Telephone Encounter (Signed)
mailed unread message to patient, thanks

## 2017-08-30 ENCOUNTER — Encounter: Payer: Self-pay | Admitting: Pulmonary Disease

## 2017-08-30 ENCOUNTER — Ambulatory Visit (INDEPENDENT_AMBULATORY_CARE_PROVIDER_SITE_OTHER)
Admission: RE | Admit: 2017-08-30 | Discharge: 2017-08-30 | Disposition: A | Discharge status: Home / Self Care | Payer: Medicare Other | Source: Ambulatory Visit | Attending: Pulmonary Disease | Admitting: Pulmonary Disease

## 2017-08-30 ENCOUNTER — Ambulatory Visit (INDEPENDENT_AMBULATORY_CARE_PROVIDER_SITE_OTHER): Payer: Medicare Other | Admitting: Pulmonary Disease

## 2017-08-30 ENCOUNTER — Other Ambulatory Visit (INDEPENDENT_AMBULATORY_CARE_PROVIDER_SITE_OTHER): Payer: Medicare Other

## 2017-08-30 VITALS — BP 118/64 | HR 78 | Temp 97.4°F | Ht 68.0 in | Wt 113.0 lb

## 2017-08-30 DIAGNOSIS — R059 Cough, unspecified: Secondary | ICD-10-CM

## 2017-08-30 DIAGNOSIS — J9611 Chronic respiratory failure with hypoxia: Secondary | ICD-10-CM

## 2017-08-30 DIAGNOSIS — R0902 Hypoxemia: Secondary | ICD-10-CM

## 2017-08-30 DIAGNOSIS — Z9981 Dependence on supplemental oxygen: Secondary | ICD-10-CM | POA: Diagnosis not present

## 2017-08-30 DIAGNOSIS — J411 Mucopurulent chronic bronchitis: Secondary | ICD-10-CM | POA: Diagnosis not present

## 2017-08-30 DIAGNOSIS — R05 Cough: Secondary | ICD-10-CM

## 2017-08-30 DIAGNOSIS — R0602 Shortness of breath: Secondary | ICD-10-CM

## 2017-08-30 LAB — CBC WITH DIFFERENTIAL/PLATELET
BASOS ABS: 0 10*3/uL (ref 0.0–0.1)
Basophils Relative: 0.2 % (ref 0.0–3.0)
EOS PCT: 4.1 % (ref 0.0–5.0)
Eosinophils Absolute: 0.6 10*3/uL (ref 0.0–0.7)
HCT: 40.4 % (ref 36.0–46.0)
Hemoglobin: 13 g/dL (ref 12.0–15.0)
LYMPHS ABS: 1.2 10*3/uL (ref 0.7–4.0)
Lymphocytes Relative: 8.3 % — ABNORMAL LOW (ref 12.0–46.0)
MCHC: 32.3 g/dL (ref 30.0–36.0)
MCV: 97.2 fl (ref 78.0–100.0)
MONO ABS: 1.3 10*3/uL — AB (ref 0.1–1.0)
Monocytes Relative: 8.7 % (ref 3.0–12.0)
NEUTROS ABS: 11.7 10*3/uL — AB (ref 1.4–7.7)
NEUTROS PCT: 78.7 % — AB (ref 43.0–77.0)
PLATELETS: 364 10*3/uL (ref 150.0–400.0)
RBC: 4.15 Mil/uL (ref 3.87–5.11)
RDW: 14.5 % (ref 11.5–15.5)
WBC: 14.9 10*3/uL — ABNORMAL HIGH (ref 4.0–10.5)

## 2017-08-30 LAB — COMPREHENSIVE METABOLIC PANEL
ALT: 27 U/L (ref 0–35)
AST: 29 U/L (ref 0–37)
Albumin: 3.7 g/dL (ref 3.5–5.2)
Alkaline Phosphatase: 79 U/L (ref 39–117)
BUN: 19 mg/dL (ref 6–23)
CO2: 34 meq/L — AB (ref 19–32)
Calcium: 9.1 mg/dL (ref 8.4–10.5)
Chloride: 98 mEq/L (ref 96–112)
Creatinine, Ser: 0.79 mg/dL (ref 0.40–1.20)
GFR: 73.84 mL/min (ref >60.00)
GLUCOSE: 127 mg/dL — AB (ref 70–99)
POTASSIUM: 4.3 meq/L (ref 3.5–5.1)
SODIUM: 138 meq/L (ref 135–145)
TOTAL PROTEIN: 7.2 g/dL (ref 6.0–8.3)
Total Bilirubin: 0.3 mg/dL (ref 0.2–1.2)

## 2017-08-30 MED ORDER — PREDNISONE 10 MG PO TABS: ORAL_TABLET | ORAL | 0 refills | Status: DC

## 2017-08-30 MED ORDER — AMOXICILLIN-POT CLAVULANATE 875-125 MG PO TABS: 1.0000 | ORAL_TABLET | Freq: Two times a day (BID) | ORAL | 0 refills | Status: DC

## 2017-08-30 NOTE — Telephone Encounter (Signed)
Encounter open in error 

## 2017-08-30 NOTE — Progress Notes (Signed)
Subjective:    Patient ID: Hannah Kline, female    DOB: 04-Aug-1932, 83 y.o.   MRN: [id number] Synopsis: [name] established care with the [facility] pulmonary office in February 2013 for bronchiectasis due to [place]. She was diagnosed at age 80 in [place]. She was treated with rifampin ethambutol and clarithromycin for 2 years. Since then she does not believe she was ever grown out MAI.  She had c.diff colitis in 2015 which was refractory to treatment.    HPI  Chief Complaint  Patient presents with  . Acute Visit    c/o increased sob with any exertion, prod cough with gray/yellow mucus Xfew days.     [name] says this is the worse she has ever felt.  She feels "totally weak and nauseated".   She has been sick for the last two days.  She denies feeling like she has caught a cold.  She says that she has had hives and she was treated with prednisone.  She says that this helped with the hives and her itching.   For the last two days she is weak, out of breath and unable to come off of her oxygen.  She has a lousy appetite, she feels shaky, nearly fell today.  She hasn't felt that she is in afib at all.  No fever or chills.  No recent diarrhea.  She has nausea, but no vomiting.  No chest pain.  No headache, no body aches.  Some sinus problems, nothing new.  She is coughing, but not more than normal.    Past Medical History:  Diagnosis Date  . Atrial fibrillation ([place])   . Bacterial infection due to Pseudomonas   . Bronchiectasis   . COPD (chronic obstructive pulmonary disease) ([place])    . Cough    from bronchiectasis neb txs  . Dysrhythmia    A Fib  . Hepatitis    h/o INH(isoniazide)  . History of Mycobacterium avium complex infection    lung disease  . Hypothyroidism   . Pleurisy 12/08/11  . Shortness of breath dyspnea      Review of Systems  Constitutional: Negative for appetite change, chills, fatigue, fever and unexpected weight change.  HENT:  Negative for congestion, ear pain and nosebleeds.   Respiratory: Negative for cough, choking and shortness of breath.   Cardiovascular: Negative for chest pain and leg swelling.       Objective:   Physical Exam Vitals:   08/30/17 1422  BP: 118/64  Pulse: 78  Temp: (!) 97.4 F (36.3 C)  TempSrc: Oral  SpO2: 91%  Weight: 113 lb (51.3 kg)  Height: _0  (1.727 m)   2L   Gen: well appearing HENT: OP clear, TM's clear, neck supple PULM: crackles left upper lobe, no wheezing, normal percussion CV: RRR, no mgr, trace edema GI: BS+, soft, nontender Derm: no cyanosis or rash Psyche: anxious   Records reviewed from her last visit with [place] in September 2018 where she was continued on Brovana, Pulmicort and oxygen.    Micro Review of 2012 sputum microbiology: Pan sensitive pseudomonas  10/11/2012 Sputum: PSEUDOMONAS AERUGINOSA       Antibiotic  Sensitivity  Microscan  Status    CEFEPIME  Sensitive  2  Final    CEFTAZIDIME  Sensitive  <=1  Final    CIPROFLOXACIN  Intermediate  2  Final    GENTAMICIN  Sensitive  <=1  Final    IMIPENEM  Sensitive  1  Final    LEVOFLOXACIN  Intermediate  4  Final    PIP/TAZO  Sensitive  <=4  Final    TOBRAMYCIN  Sensitive  <=1  Final    January 2014 Sputum AFB > neg x3   2015 Sputum> Pseudomonas , Cipro/Levofloxacin Intermediate suscept; Cefepime,Ceftaz, Imi, Pip-Tazo, Gent all susceptible  September 2015 sputum culture> Klebsiella resistant to amoxicillin but susceptible to Augmentin and multiple other agents  January - February 2016 Sputum AFB negative x 3  12/2015 AFB sputum > MAI 03/2016 AFB sputum > negative 05/2017 Sputum culture > Pseudomonas, pan sensitive  PFT 09/2010 Full PFT: Ratio 63%, FEV1 1.49L (67% pred) December 2013 simple spirometry FEV1 0.8 L January 2014 simple spirometry >> not reproducible by ATS standards 11/2013 FEV1 0.93 L April 2018 simple spirometry ratio 61%, FEV1 0.67 L 30% predicted, FVC 1.0 936%  predicted  Imaging 10/2011 CT Chest: impressive upper lobe cystic bronchiectasis, scattered lower lobe tree-in-bud abnormalities and scattered nodules; One RML solid nodule measures 7.57m  January 2013 CT chest [facility]: Stable upper lobe cystic bronchiectasis also present in the right middle lobe. Scattered tree in bud abnormalities and nodules throughout all lung fields roughly unchanged from prior. Right middle lobe nodule is no longer visible but there is an 832mnodule in the right upper lobe.  June 2018 high-resolution CT scan of the chest images independently reviewed showing tree in bud abnormalities and cylindrical bronchiectasis in the upper lobes with some nodules noted, findings worse in the right middle lobe and the lingula with subsegmental atelectasis in the right upper lobe. There is a nodule in the right base which is not changed. Overall impression is consistent with bronchiectasis and findings worrisome for an atypical lung infection like Mycobacterium avium intercellular.     Assessment & Plan:  SOB (shortness of breath) - Plan: CBC w/Diff, Comp Met (CMET), DG Chest 2 View  Cough - Plan: CBC w/Diff, Comp Met (CMET), DG Chest 2 View  Hypoxemia requiring supplemental oxygen  Chronic respiratory failure with hypoxia (HCC)  Mucopurulent chronic bronchitis (HCC)  Discussion: GrGeniaresents today with a litany of nonspecific complaints, most significantly that she says she feels run down more short of breath than normal.  Objectively she does have crackles in the left upper lobe which is a bit unusual for her, that the rest of her vital signs are within normal limits.  His differential diagnosis here is broad but given the severity of her chronic lung disease she could very well have pneumonia or a bronchiectasis exacerbation so I will treat her for the same.  However, I need to get blood work to look for evidence of anemia or renal failure.  I will also get a chest x-ray to make sure  there is no evidence of pneumonia.  I explained to her and her husband today that if she feels that she is getting worse with increasing shortness of breath or worsening cough she will need to go to the hospital.  Were somewhat limited because I cannot use Levaquin considering her cardiac medications.  There is an interaction which may lead to QT prolongation if I use Levaquin.  So we will treat with Augmentin which will hopefully controlled her known Pseudomonas.  However if she worsens she will need to go into the hospital to get IV treatment for her Pseudomonas.  Plan: For bronchiectasis exacerbation: Take augmentin 875 mg twice a day for 7 days Prednisone taper Keep taking pulmicort and brovana as you are doing  Using the flutter valve twice a day Chest x-ray today to make sure there is no evidence of pneumonia  Acute on chronic respiratory failure with hypoxemia: Keep using oxygen 2 L at rest, 3 with exertion  We will have you come back and see our nurse practitioner in a week to make sure you are getting better    Current Outpatient Prescriptions:  .  apixaban (ELIQUIS) 2.5 MG TABS tablet, Take 1 tablet (2.5 mg total) by mouth 2 (two) times daily., Disp: 180 tablet, Rfl: 3 .  arformoterol (BROVANA) 15 MCG/2ML NEBU, Take 2 mLs (15 mcg total) by nebulization 2 (two) times daily., Disp: 120 mL, Rfl: 11 .  budesonide (PULMICORT) 0.5 MG/2ML nebulizer solution, Take 2 mLs (0.5 mg total) by nebulization 2 (two) times daily., Disp: 120 mL, Rfl: 11 .  diltiazem (CARDIZEM) 30 MG tablet, Take 1 tablet (30 mg total) by mouth 3 (three) times daily as needed (atrial fibrillation)., Disp: 270 tablet, Rfl: 3 .  dorzolamide (TRUSOPT) 2 % ophthalmic solution, Place 1 drop into the left eye 2 (two) times daily., Disp: , Rfl: 7 .  hydrOXYzine (ATARAX/VISTARIL) 25 MG tablet, Take 1 tablet (25 mg total) by mouth 3 (three) times daily as needed., Disp: 90 tablet, Rfl: 1 .  levothyroxine (SYNTHROID,  LEVOTHROID) 75 MCG tablet, Take 1 tablet (75 mcg total) by mouth daily., Disp: 90 tablet, Rfl: 0 .  montelukast (SINGULAIR) 10 MG tablet, TAKE 1 TABLET BY MOUTH EVERYDAY AT BEDTIME, Disp: 90 tablet, Rfl: 3 .  OXYGEN, Inhale 2 L into the lungs. [place] - when exercising, walking around, and at night, Disp: , Rfl:  .  propafenone (RYTHMOL) 150 MG tablet, Take 1 tablet (150 mg total) by mouth 2 (two) times daily., Disp: 60 tablet, Rfl: 6 .  Respiratory Therapy Supplies (FLUTTER) DEVI, Use as directed, Disp: 1 each, Rfl: 0 .  furosemide (LASIX) 20 MG tablet, Take 1 tablet (20 mg total) by mouth as needed for fluid (shortness of breath)., Disp: 30 tablet, Rfl: 6 .  potassium chloride (K-DUR) 10 MEQ tablet, Take 1 tablet (10 mEq total) by mouth as needed (take with Lasix)., Disp: 30 tablet, Rfl: 6

## 2017-08-30 NOTE — Patient Instructions (Addendum)
For bronchiectasis exacerbation: Take augmentin 875 mg twice a day for 7 days Prednisone taper Keep taking pulmicort and brovana as you are doing Using the flutter valve twice a day Chest x-ray today to make sure there is no evidence of pneumonia  Acute on chronic respiratory failure with hypoxemia: Keep using oxygen 2 L at rest, 3 with exertion  We will have you come back and see our nurse practitioner in a week to make sure you are getting better

## 2017-08-31 ENCOUNTER — Telehealth: Payer: Self-pay | Admitting: Pulmonary Disease

## 2017-08-31 NOTE — Telephone Encounter (Signed)
Lab and cxr results were relayed yesterday afternoon to pt. Spoke with pt, states she and her spouse could not recall the results-these were re-relayed to pt.  Nothing further needed.

## 2017-09-01 ENCOUNTER — Ambulatory Visit (INDEPENDENT_AMBULATORY_CARE_PROVIDER_SITE_OTHER): Payer: Medicare Other | Admitting: *Deleted

## 2017-09-01 DIAGNOSIS — E538 Deficiency of other specified B group vitamins: Secondary | ICD-10-CM

## 2017-09-01 MED ORDER — CYANOCOBALAMIN 1000 MCG/ML IJ SOLN
1000.0000 ug | Freq: Once | INTRAMUSCULAR | Status: AC
  Administered 2017-09-01: 1000 ug via INTRAMUSCULAR

## 2017-09-01 NOTE — Progress Notes (Signed)
Patient presented for B 12 injection to right deltoid, patient voiced no concerns nor showed any signs of distress during injection. 

## 2017-09-05 ENCOUNTER — Encounter: Payer: Self-pay | Admitting: Adult Health

## 2017-09-05 ENCOUNTER — Ambulatory Visit (INDEPENDENT_AMBULATORY_CARE_PROVIDER_SITE_OTHER): Payer: Medicare Other | Admitting: Adult Health

## 2017-09-05 DIAGNOSIS — J471 Bronchiectasis with (acute) exacerbation: Secondary | ICD-10-CM | POA: Diagnosis not present

## 2017-09-05 DIAGNOSIS — Z9981 Dependence on supplemental oxygen: Secondary | ICD-10-CM

## 2017-09-05 DIAGNOSIS — R0902 Hypoxemia: Secondary | ICD-10-CM

## 2017-09-05 MED ORDER — AMOXICILLIN-POT CLAVULANATE 875-125 MG PO TABS: 1.0000 | ORAL_TABLET | Freq: Two times a day (BID) | ORAL | 0 refills | Status: AC

## 2017-09-05 NOTE — Patient Instructions (Addendum)
Extend Augmentin for additional 3 days .  Finish Prednisone taper as directed.  Continue on Brovana and Pulmicort Neb Twice daily   Continue on Mucinex Twice daily   Continue on Oxygen 2l/m . Marland Kitchen  Restart Flutter valve Three times a day  .  Follow up with Dr. [place] Bells in 2  months and As needed   Please contact office for sooner follow up if symptoms do not improve or worsen or seek emergency care

## 2017-09-05 NOTE — Assessment & Plan Note (Signed)
Cont on O2 .  

## 2017-09-05 NOTE — Progress Notes (Signed)
_0  ID: [name], female    DOB: 03/29/1932, 83 y.o.   MRN: [id number]  No chief complaint on file.   Referring provider: Crecencio Mc, MD  HPI: 83 year old female followed for bronchiectasis due to MAI care with the [facility] pulmonary office in February 2013 for bronchiectasis due to [place]. She was diagnosed at age 72 in [place]. She was treated with rifampin ethambutol and clarithromycin for 2 years. Since then she does not believe she was ever grown out MAI.  She had c.diff colitis in 2015 which was refractory to treatment.   Known Psuedomonas in sputum cx -pan sensitive    TEST  PFT 09/2010 Full PFT: Ratio 63%, FEV1 1.49L (67% pred) 08 October 2012 simple spirometry FEV1 0.8 L January 2014 simple spirometry >> not reproducible by ATS standards 11/2013 FEV1 0.93 L April 2018 simple spirometry ratio 61%, FEV1 0.67 L 30% predicted, FVC 1.0 936% predicted  Imaging 10/2011 CT Chest: impressive upper lobe cystic bronchiectasis, scattered lower lobe tree-in-bud abnormalities and scattered nodules; One RML solid nodule measures 7.25m  January 2013 CT chest [facility]: Stable upper lobe cystic bronchiectasis also present in the right middle lobe. Scattered tree in bud abnormalities and nodules throughout all lung fields roughly unchanged from prior. Right middle lobe nodule is no longer visible but there is an 883mnodule in the right upper lobe.  June 2018 high-resolution CT scan of the chest images independently reviewed showing tree in bud abnormalities and cylindrical bronchiectasis in the upper lobes with some nodules noted, findings worse in the right middle lobe and the lingula with subsegmental atelectasis in the right upper lobe. There is a nodule in the right base which is not changed. Overall impression is consistent with bronchiectasis and findings worrisome for an atypical lung infection like Mycobacterium avium  intercellular.   09/05/2017 Follow up : Bronchiectasis  Patient returns for one-week follow-up.  Patient was having increased cough congestion and shortness of breath last visit.  Felt to have a bronchiectatic exacerbation.  She was started on Augmentin and a prednisone taper.  Chest x-ray did not show an acute process.  Patient says she is feeling some better but has remained very weak.  She says she has a good appetite and weight is actually went up a couple pounds.  She denies any nausea vomiting or diarrhea.  She denies any hemoptysis fever or chest pain.  Patient is supposed to be using flutter valve but says that she has not been able to find hers for a good while. She remains on Brovana and Pulmicort nebulizers.  She is ta very muchking a probiotic    Allergies  Allergen Reactions  . Ciprofloxacin Other (See Comments)    c-diff  . Isoniazid     Hepatitis, weakness, nausea  . Moxifloxacin     Avelox  REACTION: chills, fainting    Immunization History  Administered Date(s) Administered  . Influenza Split 08/07/2012, 08/14/2013, 09/04/2015  . Influenza Whole 08/08/2010, 07/10/2011, 08/18/2011  . Influenza, High Dose Seasonal PF 09/02/2016, 08/11/2017  . Influenza-Unspecified 08/29/2014  . Pneumococcal Conjugate-13 01/07/2014  . Pneumococcal Polysaccharide-23 08/08/2010  . Tdap 07/09/2009    Past Medical History:  Diagnosis Date  . Atrial fibrillation ([place]  . Bacterial infection due to Pseudomonas   . Bronchiectasis   . COPD (chronic obstructive pulmonary disease) ([facility]   . Cough    from bronchiectasis neb txs  . Dysrhythmia    A Fib  .  Hepatitis    h/o INH(isoniazide)  . History of Mycobacterium avium complex infection    lung disease  . Hypothyroidism   . Pleurisy 12/08/11  . Shortness of breath dyspnea     Tobacco History: History  Smoking Status  . Never Smoker  Smokeless Tobacco  . Never Used   Counseling given: Not Answered   Outpatient Encounter  Prescriptions as of 09/05/2017  Medication Sig  . amoxicillin-clavulanate (AUGMENTIN) 875-125 MG tablet Take 1 tablet by mouth 2 (two) times daily.  Marland Kitchen apixaban (ELIQUIS) 2.5 MG TABS tablet Take 1 tablet (2.5 mg total) by mouth 2 (two) times daily.  Marland Kitchen arformoterol (BROVANA) 15 MCG/2ML NEBU Take 2 mLs (15 mcg total) by nebulization 2 (two) times daily.  . budesonide (PULMICORT) 0.5 MG/2ML nebulizer solution Take 2 mLs (0.5 mg total) by nebulization 2 (two) times daily.  Marland Kitchen diltiazem (CARDIZEM) 30 MG tablet Take 1 tablet (30 mg total) by mouth 3 (three) times daily as needed (atrial fibrillation).  . dorzolamide (TRUSOPT) 2 % ophthalmic solution Place 1 drop into the left eye 2 (two) times daily.  . hydrOXYzine (ATARAX/VISTARIL) 25 MG tablet Take 1 tablet (25 mg total) by mouth 3 (three) times daily as needed.  Marland Kitchen levothyroxine (SYNTHROID, LEVOTHROID) 75 MCG tablet Take 1 tablet (75 mcg total) by mouth daily.  . montelukast (SINGULAIR) 10 MG tablet TAKE 1 TABLET BY MOUTH EVERYDAY AT BEDTIME  . OXYGEN Inhale 2 L into the lungs. [place] - when exercising, walking around, and at night  . predniSONE (DELTASONE) 10 MG tablet 52mX3 days, 353mX3 days, 2053m2 days, 29m44mdays, then stop.  . propafenone (RYTHMOL) 150 MG tablet Take 1 tablet (150 mg total) by mouth 2 (two) times daily.  . ReMarland Kitchenpiratory Therapy Supplies (FLUTTER) DEVI Use as directed  . amoxicillin-clavulanate (AUGMENTIN) 875-125 MG tablet Take 1 tablet by mouth 2 (two) times daily.  . furosemide (LASIX) 20 MG tablet Take 1 tablet (20 mg total) by mouth as needed for fluid (shortness of breath).  . potassium chloride (K-DUR) 10 MEQ tablet Take 1 tablet (10 mEq total) by mouth as needed (take with Lasix).   No facility-administered encounter medications on file as of 09/05/2017.      Review of Systems  Constitutional:   No  weight loss, night sweats,  Fevers, chills, fatigue, or  lassitude.  HEENT:   No headaches,  Difficulty swallowing,   Tooth/dental problems, or  Sore throat,                No sneezing, itching, ear ache,  +nasal congestion, post nasal drip,   CV:  No chest pain,  Orthopnea, PND, swelling in lower extremities, anasarca, dizziness, palpitations, syncope.   GI  No heartburn, indigestion, abdominal pain, nausea, vomiting, diarrhea, change in bowel habits, loss of appetite, bloody stools.   Resp:    No chest wall deformity  Skin: no rash or lesions.  GU: no dysuria, change in color of urine, no urgency or frequency.  No flank pain, no hematuria   MS:  No joint pain or swelling.  No decreased range of motion.  No back pain.    Physical Exam  BP 126/74 (BP Location: Left Arm, Cuff Size: Normal)   Pulse 70   Ht _0  (1.727 m)   Wt 114 lb 3.2 oz (51.8 kg)   SpO2 95%   BMI 17.36 kg/m   GEN: A/Ox3; pleasant , NAD, thin and frail   HEENT:  [place]/AT,  EACs-clear,  TMs-wnl, NOSE-clear, THROAT-clear, no lesions, no postnasal drip or exudate noted.   NECK:  Supple w/ fair ROM; no JVD; normal carotid impulses w/o bruits; no thyromegaly or nodules palpated; no lymphadenopathy.    RESP  Diminished BS in bases , . no accessory muscle use, no dullness to percussion  CARD:  RRR, no m/r/g, no peripheral edema, pulses intact, no cyanosis or clubbing.  GI:   Soft & nt; nml bowel sounds; no organomegaly or masses detected.   Musco: Warm bil, no deformities or joint swelling noted.   Neuro: alert, no focal deficits noted.    Skin: Warm, no lesions or rashes    Lab Results:  CBC  BMET Dictation #1 [id number]  [id number]   BNP No results found for: BNP  ProBNP No results found for: PROBNP  Imaging: Dg Chest 2 View  Result Date: 08/30/2017 CLINICAL DATA:  Severe shortness of breath for 4 days, history of bronchiectasis and mycobacterium avium EXAM: CHEST  2 VIEW COMPARISON:  Portable chest x-ray of 04/24/2017, and CT chest of 02/17/2017 FINDINGS: Chronic changes again noted are throughout  the lungs consistent scarring and bronchiectatic change in this patient with history of mycobacterium avium complex. A rounded opacity overlying the left upper heart border may well represent the scarring and bronchiectatic change in the lingula on prior CT of the chest. No pleural effusion is seen. Mediastinal and hilar contours are unremarkable. The heart is mildly enlarged and stable. No acute bony abnormality is seen. IMPRESSION: Stable chronic pleuroparenchymal changes from prior mycobacterium avium complex. No definite active process. Electronically Signed   By: Ivar Drape M.D.   On: 08/30/2017 16:23     Assessment & Plan:   Bronchiectasis with acute exacerbation Slowly resolving flare   Plan  Patient Instructions  Extend Augmentin for additional 3 days .  Finish Prednisone taper as directed.  Continue on Brovana and Pulmicort Neb Twice daily   Continue on Mucinex Twice daily   Continue on Oxygen 2l/m . Marland Kitchen  Restart Flutter valve Three times a day  .  Follow up with Dr. [place] Bells in 2  months and As needed   Please contact office for sooner follow up if symptoms do not improve or worsen or seek emergency care       Hypoxemia requiring supplemental oxygen Cont on O2 .      Rexene Edison, NP 09/05/2017

## 2017-09-05 NOTE — Assessment & Plan Note (Signed)
Slowly resolving flare   Plan  Patient Instructions  Extend Augmentin for additional 3 days .  Finish Prednisone taper as directed.  Continue on Brovana and Pulmicort Neb Twice daily   Continue on Mucinex Twice daily   Continue on Oxygen 2l/m . Marland Kitchen  Restart Flutter valve Three times a day  .  Follow up with Dr. [place] Bells in 2  months and As needed   Please contact office for sooner follow up if symptoms do not improve or worsen or seek emergency care

## 2017-09-06 MED ORDER — FLUTTER DEVI: 0 refills | Status: DC

## 2017-09-06 NOTE — Addendum Note (Signed)
Addended by: Parke Poisson E on: 09/06/2017 12:26 PM   Modules accepted: Orders

## 2017-09-06 NOTE — Progress Notes (Signed)
Reviewed, agree 

## 2017-09-08 ENCOUNTER — Ambulatory Visit (INDEPENDENT_AMBULATORY_CARE_PROVIDER_SITE_OTHER): Payer: Medicare Other | Admitting: *Deleted

## 2017-09-08 DIAGNOSIS — E538 Deficiency of other specified B group vitamins: Secondary | ICD-10-CM

## 2017-09-08 MED ORDER — CYANOCOBALAMIN 1000 MCG/ML IJ SOLN
1000.0000 ug | Freq: Once | INTRAMUSCULAR | Status: AC
  Administered 2017-09-08: 1000 ug via INTRAMUSCULAR

## 2017-09-08 NOTE — Progress Notes (Signed)
Patient presented for B 12 injection to left deltoid, patient voiced no concerns nor showed any signs of distress during injection. 

## 2017-09-14 ENCOUNTER — Other Ambulatory Visit: Payer: Self-pay

## 2017-09-14 ENCOUNTER — Ambulatory Visit (HOSPITAL_COMMUNITY)
Admission: RE | Admit: 2017-09-14 | Discharge: 2017-09-14 | Disposition: A | Discharge status: Home / Self Care | Payer: Medicare Other | Source: Ambulatory Visit | Attending: Internal Medicine | Admitting: Internal Medicine

## 2017-09-14 DIAGNOSIS — Z881 Allergy status to other antibiotic agents status: Secondary | ICD-10-CM | POA: Diagnosis not present

## 2017-09-14 DIAGNOSIS — Z9981 Dependence on supplemental oxygen: Secondary | ICD-10-CM | POA: Diagnosis not present

## 2017-09-14 DIAGNOSIS — I480 Paroxysmal atrial fibrillation: Secondary | ICD-10-CM | POA: Diagnosis not present

## 2017-09-14 DIAGNOSIS — Z8619 Personal history of other infectious and parasitic diseases: Secondary | ICD-10-CM | POA: Diagnosis not present

## 2017-09-14 DIAGNOSIS — Z8379 Family history of other diseases of the digestive system: Secondary | ICD-10-CM | POA: Insufficient documentation

## 2017-09-14 DIAGNOSIS — I272 Pulmonary hypertension, unspecified: Secondary | ICD-10-CM | POA: Diagnosis not present

## 2017-09-14 DIAGNOSIS — Z833 Family history of diabetes mellitus: Secondary | ICD-10-CM | POA: Insufficient documentation

## 2017-09-14 DIAGNOSIS — Z8249 Family history of ischemic heart disease and other diseases of the circulatory system: Secondary | ICD-10-CM | POA: Diagnosis not present

## 2017-09-14 DIAGNOSIS — E039 Hypothyroidism, unspecified: Secondary | ICD-10-CM | POA: Diagnosis not present

## 2017-09-14 DIAGNOSIS — Z79899 Other long term (current) drug therapy: Secondary | ICD-10-CM | POA: Insufficient documentation

## 2017-09-14 DIAGNOSIS — J449 Chronic obstructive pulmonary disease, unspecified: Secondary | ICD-10-CM | POA: Diagnosis not present

## 2017-09-14 DIAGNOSIS — Z888 Allergy status to other drugs, medicaments and biological substances status: Secondary | ICD-10-CM | POA: Insufficient documentation

## 2017-09-14 DIAGNOSIS — Z7902 Long term (current) use of antithrombotics/antiplatelets: Secondary | ICD-10-CM | POA: Diagnosis not present

## 2017-09-14 NOTE — Progress Notes (Signed)
Pulmonary Hypertension Clinic Note   Referring Physician: Dr. Caryl Comes Primary Care: Dr. Derrel Nip Primary Cardiologist: Dr. Rockey Situ EP: Dr. Caryl Comes Pulmonary: Dr. [place] Bells HF/PAH: New (Dr. Haroldine Laws)  HPI:  Hannah Kline is a 83 y.o. female with PMH of bronchiectasis due to MAI (Mycobacterium avium-intracellulare) followed by Dr. [place] Bells, severe obstructive COPD, Paroxysmal A-fib on Eliquis referred to [facility] clinic by Dr Caryl Comes.   Last seen by Dr. Caryl Comes 08/04/17. She expressed concerns over her gradually worsening functional status and seemed depressed.   Last seen by Dr. [place] Bells 08/30/17. She was diagnosed with MAI at age 54 at [place] when she lived in [place]. Treated with rifampin ethambutol and clarithromycin. F/u cultures have all been negative.   Felt to be recovering from A/C bronchiectasis and treated with Augmentin and prednisone taper. CXR at that time showed stable chronic pleuroparenchymal disease with no definite active process.   She is here today with her husband. Reports being very weak. Gets winded with ADLs and feels it is getting worse. Wears O2 continuously. Denies CP, orthopnea, PND or edema. Appetite poor. + mild cough.    Echo 02/25/2017  - LVEF 53-61%, Normal diastolic function Normal RV, Mod TR, PA peak pressure 61 mm Hg.No septal flattening Personally reviewed   PFTs 02/15/2017 with severe obstruction and low vital capacity FVC 1.09 (39%) FEV1 0.67 (30%) FEV1/FVC 61% No DLCO performed  Hi-res chest CT 4/18: High-resolution images again demonstrate widespread cylindrical and varicose bronchiectasis with occasional areas with some cystic bronchiectasis as well. In the areas of greatest involvement there is extensive thickening of the peribronchovascular interstitium with regional areas of chronic volume loss and architectural distortion. These findings are most severe in the right middle lobe, inferior right upper lobe, inferior segment of the lingula, and in the apices  of the upper lobes of the lungs bilaterally. Widespread peribronchovascular micro and macronodularity is again noted. Overall, the findings are very similar to remote prior study from 2013, and remain most compatible with chronic indolent atypical infectious process such as MAI (mycobacterium avium intracellulare). Inspiratory and expiratory imaging does demonstrate some mild air trapping.   Review of Systems: [y] = yes, [ ]  = no   General: Weight gain [ ] ; Weight loss Blue.Reese ]; Anorexia [ y]; Fatigue [ y]; Fever [ ] ; Chills [ ] ; Weakness Blue.Reese ]  Cardiac: Chest pain/pressure [ ] ; Resting SOB Blue.Reese ]; Exertional SOB [ y]; Orthopnea [ ] ; Pedal Edema [ ] ; Palpitations [ ] ; Syncope [ ] ; Presyncope [ ] ; Paroxysmal nocturnal dyspnea[ ]   Pulmonary: Cough [ ] ; Wheezing[ ] ; Hemoptysis[ ] ; Sputum [ ] ; Snoring [ ]   GI: Vomiting[ ] ; Dysphagia[ ] ; Melena[ ] ; Hematochezia [ ] ; Heartburn[ ] ; Abdominal pain [ ] ; Constipation [ ] ; Diarrhea [ ] ; BRBPR [ ]   GU: Hematuria[ ] ; Dysuria [ ] ; Nocturia[ ]   Vascular: Pain in legs with walking [ ] ; Pain in feet with lying flat [ ] ; Non-healing sores [ ] ; Stroke [ ] ; TIA [ ] ; Slurred speech [ ] ;  Neuro: Headaches[ ] ; Vertigo[ ] ; Seizures[ ] ; Paresthesias[ ] ;Blurred vision [ ] ; Diplopia [ ] ; Vision changes [ ]   Ortho/Skin: Arthritis Blue.Reese ]; Joint pain Blue.Reese ]; Muscle pain [ ] ; Joint swelling [ ] ; Back Pain [ ] ; Rash [ ]   Psych: Depression[y ]; Anxiety[ ]   Heme: Bleeding problems [ ] ; Clotting disorders [ ] ; Anemia [ ]   Endocrine: Diabetes [ ] ; Thyroid dysfunction[ ]    Past Medical History:  Diagnosis Date  . Atrial fibrillation ([place])   .  Bacterial infection due to Pseudomonas   . Bronchiectasis   . COPD (chronic obstructive pulmonary disease) ([place])    . Cough    from bronchiectasis neb txs  . Dysrhythmia    A Fib  . Hepatitis    h/o INH(isoniazide)  . History of Mycobacterium avium complex infection    lung disease  . Hypothyroidism   . Pleurisy 12/08/11  . Shortness of  breath dyspnea     Current Outpatient Medications  Medication Sig Dispense Refill  . apixaban (ELIQUIS) 2.5 MG TABS tablet Take 1 tablet (2.5 mg total) by mouth 2 (two) times daily. 180 tablet 3  . arformoterol (BROVANA) 15 MCG/2ML NEBU Take 2 mLs (15 mcg total) by nebulization 2 (two) times daily. 120 mL 11  . budesonide (PULMICORT) 0.5 MG/2ML nebulizer solution Take 2 mLs (0.5 mg total) by nebulization 2 (two) times daily. 120 mL 11  . diltiazem (CARDIZEM) 30 MG tablet Take 1 tablet (30 mg total) by mouth 3 (three) times daily as needed (atrial fibrillation). 270 tablet 3  . dorzolamide (TRUSOPT) 2 % ophthalmic solution Place 1 drop into the left eye 2 (two) times daily.  7  . hydrOXYzine (ATARAX/VISTARIL) 25 MG tablet Take 1 tablet (25 mg total) by mouth 3 (three) times daily as needed. 90 tablet 1  . levothyroxine (SYNTHROID, LEVOTHROID) 75 MCG tablet Take 1 tablet (75 mcg total) by mouth daily. 90 tablet 0  . OXYGEN Inhale 2 L into the lungs. [place] - when exercising, walking around, and at night    . propafenone (RYTHMOL) 150 MG tablet Take 1 tablet (150 mg total) by mouth 2 (two) times daily. 60 tablet 6  . Respiratory Therapy Supplies (FLUTTER) DEVI Use as directed (Patient not taking: Reported on 09/14/2017) 1 each 0   No current facility-administered medications for this encounter.     Allergies  Allergen Reactions  . Ciprofloxacin Other (See Comments)    c-diff  . Isoniazid     Hepatitis, weakness, nausea  . Moxifloxacin     Avelox  REACTION: chills, fainting      Social History   Socioeconomic History  . Marital status: Married    Spouse name: Not on file  . Number of children: 5  . Years of education: Not on file  . Highest education level: Not on file  Social Needs  . Financial resource strain: Not on file  . Food insecurity - worry: Not on file  . Food insecurity - inability: Not on file  . Transportation needs - medical: Not on file  . Transportation needs -  non-medical: Not on file  Occupational History  . Occupation: Chiropractor: retired  Tobacco Use  . Smoking status: Never Smoker  . Smokeless tobacco: Never Used  Substance and Sexual Activity  . Alcohol use: Yes    Alcohol/week: 4.2 - 4.8 oz    Types: 7 Glasses of wine per week  . Drug use: No  . Sexual activity: Not Currently  Other Topics Concern  . Not on file  Social History Narrative  . Not on file      Family History  Problem Relation Age of Onset  . Angina Mother   . Other Mother        intestional blockage  . Diabetes Maternal Grandfather     118/62  HR 70 Weight 113lbs   PHYSICAL EXAM: General:  Elderly and frail. On O2 via .  HEENT: Normal Neck: Supple. no JVD.  Carotids 2+ bilat; no bruits. No lymphadenopathy or thyromegaly appreciated. Cor: PMI nondisplaced. Regular rate & rhythm. No rubs, gallops or murmurs. Lungs: clear Abdomen: soft, nontender, nondistended. No hepatosplenomegaly. No bruits or masses. Good bowel sounds. Extremities: no cyanosis, clubbing, rash, edema Neuro: alert & oriented x 3, cranial nerves grossly intact. moves all 4 extremities w/o difficulty. Affect pleasant.   ASSESSMENT & PLAN:  1. Pulmonary Hypertension - WHO group 3 with severe obstructive lung disease and complicated disease history with MAI.  2. Bronchiactesis due to [place] with Dr. [place] Bells.  - Recently treated for flare with ABX and prednisone taper.  3. Severe COPD - Follows with Dr. Lake Bells.  4. Paroxysmal Afib - In NSR. Continue Eliquis.  - No bleeding  Records reviewed personally. Echo and CT images viewed personally. She has moderate pulmonary HTN in the setting of end-stage lung disease related to MAI. This is WHO Group 3 and she would not qualify for selective pulmonary vasodilators. On echo there is no significant RV strain. I do not think performing RHC would add anything to her care. We discussed the fact that she does appear  to be getting more frail and debilitated and may be nearing the end of her life. Suggest possible Lake discussion with her PCP.   I am happy to see back as needed.   Glori Bickers, MD  11:24 PM

## 2017-09-14 NOTE — Patient Instructions (Signed)
Follow up as needed

## 2017-09-15 ENCOUNTER — Ambulatory Visit (INDEPENDENT_AMBULATORY_CARE_PROVIDER_SITE_OTHER): Payer: Medicare Other | Admitting: *Deleted

## 2017-09-15 DIAGNOSIS — E538 Deficiency of other specified B group vitamins: Secondary | ICD-10-CM

## 2017-09-15 MED ORDER — CYANOCOBALAMIN 1000 MCG/ML IJ SOLN
1000.0000 ug | Freq: Once | INTRAMUSCULAR | Status: AC
  Administered 2017-09-15: 1000 ug via INTRAMUSCULAR

## 2017-09-15 NOTE — Progress Notes (Signed)
Patient presented for B 12 injection to right deltoid, patient voiced no concerns nor showed any signs of distress during injection. 

## 2017-09-20 ENCOUNTER — Telehealth: Payer: Self-pay | Admitting: Pulmonary Disease

## 2017-09-20 DIAGNOSIS — J471 Bronchiectasis with (acute) exacerbation: Secondary | ICD-10-CM

## 2017-09-20 NOTE — Telephone Encounter (Signed)
Spoke with Hannah Kline, states that Dr. Haroldine Laws has suggested she go back to pulm rehab- Hannah Kline has been through pulm rehab once before.  Hannah Kline wants to see if BQ agrees with this plan, and if he does is requesting that he place an order for pulm rehab through [facility] where Hannah Kline resides.  BQ please advise.  Thanks!

## 2017-09-21 NOTE — Telephone Encounter (Signed)
Called pt and advised message from the provider. Pt understood and verbalized understanding. Nothing further is needed.   Referral placed.

## 2017-09-21 NOTE — Telephone Encounter (Signed)
OK to order for pulm rehab at [facility]

## 2017-09-22 ENCOUNTER — Ambulatory Visit (INDEPENDENT_AMBULATORY_CARE_PROVIDER_SITE_OTHER): Payer: Medicare Other

## 2017-09-22 ENCOUNTER — Ambulatory Visit: Payer: Self-pay

## 2017-09-22 DIAGNOSIS — E538 Deficiency of other specified B group vitamins: Secondary | ICD-10-CM | POA: Diagnosis not present

## 2017-09-22 MED ORDER — CYANOCOBALAMIN 1000 MCG/ML IJ SOLN
1000.0000 ug | Freq: Once | INTRAMUSCULAR | Status: AC
  Administered 2017-09-22: 1000 ug via INTRAMUSCULAR

## 2017-09-22 NOTE — Progress Notes (Signed)
Patient presented for B 12 injection to left deltoid, patient voiced no concerns nor showed any signs of distress during injection. 

## 2017-09-26 NOTE — Progress Notes (Signed)
  I have reviewed the above information and agree with above.   Jenne Sellinger, MD 

## 2017-09-27 ENCOUNTER — Telehealth: Payer: Self-pay | Admitting: Pulmonary Disease

## 2017-09-27 DIAGNOSIS — H401492 Capsular glaucoma with pseudoexfoliation of lens, unspecified eye, moderate stage: Secondary | ICD-10-CM | POA: Diagnosis not present

## 2017-09-27 DIAGNOSIS — H401422 Capsular glaucoma with pseudoexfoliation of lens, left eye, moderate stage: Secondary | ICD-10-CM | POA: Diagnosis not present

## 2017-09-27 NOTE — Telephone Encounter (Signed)
Attempted to contact pt. No answer, no option to leave a message. Will try back.  

## 2017-09-27 NOTE — Telephone Encounter (Signed)
atc pt, line rang for 2 minutes with no answer, no vm.  Wcb.  

## 2017-09-28 NOTE — Telephone Encounter (Signed)
Ok to do it at [facility]

## 2017-09-28 NOTE — Telephone Encounter (Signed)
Spoke with a patient-states its too hard for her to get to Pulmonary Rehab at [facility] prefers to have this at [facility] where she lives.    Pt aware that BQ is back in the office on Monday 10/03/17.  Please advise on rehab order. Thanks.

## 2017-09-30 NOTE — Telephone Encounter (Signed)
PCC's can we send the order to [facility] for Pulmonary rehab. Order already placed.

## 2017-09-30 NOTE — Telephone Encounter (Signed)
I called the Rehab Dept at [facility] and had to leave a message for someone to call me back about this message

## 2017-09-30 NOTE — Telephone Encounter (Signed)
Estill Bamberg with [facility] called me back and she stated that when they worked with the patient in 2017 she was referred for PT for generalized weekness. She wants to clarify what Pulmonary Rehad we wan the patient to do. If it is just to help her increase her endurance they can help but if she requires actual Pulm Rehab like at the hospital they don't do that at [facility].  Amanda's # [phone]

## 2017-10-01 ENCOUNTER — Other Ambulatory Visit: Payer: Self-pay

## 2017-10-01 ENCOUNTER — Emergency Department: Payer: Medicare Other

## 2017-10-01 ENCOUNTER — Emergency Department
Admission: EM | Admit: 2017-10-01 | Discharge: 2017-10-01 | Disposition: A | Discharge status: Home / Self Care | Payer: Medicare Other | Attending: Emergency Medicine | Admitting: Emergency Medicine

## 2017-10-01 ENCOUNTER — Encounter: Payer: Self-pay | Admitting: Emergency Medicine

## 2017-10-01 DIAGNOSIS — Z7901 Long term (current) use of anticoagulants: Secondary | ICD-10-CM | POA: Insufficient documentation

## 2017-10-01 DIAGNOSIS — Z79899 Other long term (current) drug therapy: Secondary | ICD-10-CM | POA: Insufficient documentation

## 2017-10-01 DIAGNOSIS — E039 Hypothyroidism, unspecified: Secondary | ICD-10-CM | POA: Insufficient documentation

## 2017-10-01 DIAGNOSIS — J441 Chronic obstructive pulmonary disease with (acute) exacerbation: Secondary | ICD-10-CM | POA: Diagnosis not present

## 2017-10-01 DIAGNOSIS — R0602 Shortness of breath: Secondary | ICD-10-CM | POA: Diagnosis not present

## 2017-10-01 LAB — CBC
HEMATOCRIT: 41.2 % (ref 35.0–47.0)
HEMOGLOBIN: 13.9 g/dL (ref 12.0–16.0)
MCH: 31.7 pg (ref 26.0–34.0)
MCHC: 33.9 g/dL (ref 32.0–36.0)
MCV: 93.4 fL (ref 80.0–100.0)
Platelets: 342 10*3/uL (ref 150–440)
RBC: 4.41 MIL/uL (ref 3.80–5.20)
RDW: 14 % (ref 11.5–14.5)
WBC: 19.6 10*3/uL — AB (ref 3.6–11.0)

## 2017-10-01 LAB — COMPREHENSIVE METABOLIC PANEL
ALBUMIN: 3.6 g/dL (ref 3.5–5.0)
ALK PHOS: 91 U/L (ref 38–126)
ALT: 14 U/L (ref 14–54)
AST: 28 U/L (ref 15–41)
Anion gap: 9 (ref 5–15)
BILIRUBIN TOTAL: 0.9 mg/dL (ref 0.3–1.2)
BUN: 12 mg/dL (ref 6–20)
CALCIUM: 8.9 mg/dL (ref 8.9–10.3)
CO2: 29 mmol/L (ref 22–32)
Chloride: 94 mmol/L — ABNORMAL LOW (ref 101–111)
Creatinine, Ser: 0.77 mg/dL (ref 0.44–1.00)
GFR calc Af Amer: >60 mL/min (ref >60)
GFR calc non Af Amer: >60 mL/min (ref >60)
GLUCOSE: 79 mg/dL (ref 65–99)
Potassium: 4.1 mmol/L (ref 3.5–5.1)
SODIUM: 132 mmol/L — AB (ref 135–145)
TOTAL PROTEIN: 8.1 g/dL (ref 6.5–8.1)

## 2017-10-01 LAB — TROPONIN I: Troponin I: <0.03 ng/mL (ref <0.03)

## 2017-10-01 MED ORDER — PREDNISONE 10 MG PO TABS: 50.0000 mg | ORAL_TABLET | Freq: Every day | ORAL | 0 refills | Status: AC

## 2017-10-01 MED ORDER — SODIUM CHLORIDE 0.9 % IV BOLUS (SEPSIS)
500.0000 mL | Freq: Once | INTRAVENOUS | Status: AC
  Administered 2017-10-01: 500 mL via INTRAVENOUS

## 2017-10-01 MED ORDER — IPRATROPIUM-ALBUTEROL 0.5-2.5 (3) MG/3ML IN SOLN
3.0000 mL | Freq: Once | RESPIRATORY_TRACT | Status: AC
  Administered 2017-10-01: 3 mL via RESPIRATORY_TRACT
  Filled 2017-10-01: qty 3

## 2017-10-01 MED ORDER — IPRATROPIUM-ALBUTEROL 0.5-2.5 (3) MG/3ML IN SOLN
3.0000 mL | Freq: Once | RESPIRATORY_TRACT | Status: AC
  Administered 2017-10-01: 3 mL via RESPIRATORY_TRACT

## 2017-10-01 MED ORDER — AZITHROMYCIN 250 MG PO TABS: ORAL_TABLET | ORAL | 0 refills | Status: DC

## 2017-10-01 MED ORDER — METHYLPREDNISOLONE SODIUM SUCC 125 MG IJ SOLR
60.0000 mg | Freq: Once | INTRAMUSCULAR | Status: AC
  Administered 2017-10-01: 60 mg via INTRAVENOUS
  Filled 2017-10-01: qty 2

## 2017-10-01 MED ORDER — IPRATROPIUM-ALBUTEROL 0.5-2.5 (3) MG/3ML IN SOLN
RESPIRATORY_TRACT | Status: AC
  Administered 2017-10-01: 3 mL via RESPIRATORY_TRACT
  Filled 2017-10-01: qty 6

## 2017-10-01 NOTE — ED Provider Notes (Signed)
[facility] Emergency Department Provider Note  ____________________________________________   First MD Initiated Contact with Patient 10/01/17 1457     (approximate)  I have reviewed the triage vital signs and the nursing notes.   HISTORY  Chief Complaint Shortness of Breath    HPI Hannah Kline is a 83 y.o. female who comes to the emergency department with roughly 24 hours of worsening shortness of breath.  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  She normally uses 2 L of oxygen at home.  She has had slightly increased cough recently that is more productive than usual.  No fevers or chills.  Her symptoms are improved with albuterol and worse with exertion.  She also has aching discomfort in her left lateral chest.  This is worse with deep inspiration improved with albuterol as well.  Her symptoms began insidiously and were slowly progressive.  Past Medical History:  Diagnosis Date  . Atrial fibrillation ([place])   . Bacterial infection due to Pseudomonas   . Bronchiectasis   . COPD (chronic obstructive pulmonary disease) ([place])    . Cough    from bronchiectasis neb txs  . Dysrhythmia    A Fib  . Hepatitis    h/o INH(isoniazide)  . History of Mycobacterium avium complex infection    lung disease  . Hypothyroidism   . Pleurisy 12/08/11  . Shortness of breath dyspnea     Patient Active Problem List   Diagnosis Date Noted  . 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 Leukocytosis 05/03/2017  . Mouth pain 05/03/2017  . Hospital discharge follow-up 05/03/2017  . Acute bronchitis with chronic obstructive pulmonary disease (COPD) ([place]) 04/24/2017  . Shortness of breath 03/25/2017  . Pulmonary hypertension ([place]) 02/28/2017  . Paroxysmal A-fib ([place]) 06/14/2016  . Weakness generalized 05/27/2016  . Excessive cerumen in both ear canals 02/11/2016  . Malaise and fatigue  11/24/2014  . Glucose intolerance (pre-diabetes) 11/24/2014  . Acquired hypothyroidism 09/02/2014  . Anorexia 07/14/2014  . Protein-calorie malnutrition, mild ([place]) 07/14/2014  . Abnormal mammogram 02/19/2014  . Osteoporosis 02/14/2014  . Visit for preventive health examination 01/08/2014  . Colitis, Clostridium difficile 11/23/2013  . Bronchiectasis with acute exacerbation ([place]) 10/24/2013  . Rhinitis, nonallergic 10/10/2012  . Orthostatic hypotension 09/22/2012  . Hypoxemia requiring supplemental oxygen 02/21/2012  . Chronic sinusitis 01/03/2012  . Pulmonary nodule 01/03/2012  . Screening for colon cancer 10/05/2011  . Screening for breast cancer 10/05/2011  . Bacterial infection due to Pseudomonas   . Atrial fibrillation ([place]) 09/15/2010  . BRONCHIECTASIS 09/15/2010  . Chronic bronchitis ([place]) 09/15/2010    Past Surgical History:  Procedure Laterality Date  . CATARACT EXTRACTION W/PHACO Left 01/26/2016   Procedure: CATARACT EXTRACTION PHACO AND INTRAOCULAR LENS PLACEMENT (IOC);  Surgeon: Ronnell Freshwater, MD;  Location: [facility];  Service: Ophthalmology;  Laterality: Left;  . COLONOSCOPY    . SHOULDER ARTHROSCOPY     right    Prior to Admission medications   Medication Sig Start Date End Date Taking? Authorizing Provider  apixaban (ELIQUIS) 2.5 MG TABS tablet Take 1 tablet (2.5 mg total) by mouth 2 (two) times daily. 01/03/17   Minna Merritts, MD  arformoterol (BROVANA) 15 MCG/2ML NEBU Take 2 mLs (15 mcg total) by nebulization 2 (two) times daily. 04/14/17   Juanito Doom, MD  azithromycin (ZITHROMAX Z-PAK) 250 MG tablet Take 2 tablets (500 mg) on  Day 1,  followed by 1 tablet (250 mg) once daily on Days 2 through 5. 10/01/17   Darel Hong, MD  budesonide (PULMICORT) 0.5 MG/2ML nebulizer solution Take 2 mLs (0.5 mg total) by nebulization 2 (two) times daily. 04/14/17   Juanito Doom, MD  diltiazem (CARDIZEM) 30 MG tablet Take 1 tablet (30 mg  total) by mouth 3 (three) times daily as needed (atrial fibrillation). 01/03/17   Minna Merritts, MD  dorzolamide (TRUSOPT) 2 % ophthalmic solution Place 1 drop into the left eye 2 (two) times daily. 04/20/17   [provider]  hydrOXYzine (ATARAX/VISTARIL) 25 MG tablet Take 1 tablet (25 mg total) by mouth 3 (three) times daily as needed. 08/11/17   Crecencio Mc, MD  levothyroxine (SYNTHROID, LEVOTHROID) 75 MCG tablet Take 1 tablet (75 mcg total) by mouth daily. 07/19/17   Crecencio Mc, MD  OXYGEN Inhale 2 L into the lungs. [place] - when exercising, walking around, and at night    [provider]  predniSONE (DELTASONE) 10 MG tablet Take 5 tablets (50 mg total) by mouth daily for 4 days. 10/01/17 10/05/17  Darel Hong, MD  propafenone (RYTHMOL) 150 MG tablet Take 1 tablet (150 mg total) by mouth 2 (two) times daily. 08/23/17   Minna Merritts, MD  Respiratory Therapy Supplies (FLUTTER) DEVI Use as directed Patient not taking: Reported on 09/14/2017 09/06/17   Parrett, Fonnie Mu, NP    Allergies Ciprofloxacin; Isoniazid; and Moxifloxacin  Family History  Problem Relation Age of Onset  . Angina Mother   . Other Mother        intestional blockage  . Diabetes Maternal Grandfather     Social History Social History   Tobacco Use  . Smoking status: Never Smoker  . Smokeless tobacco: Never Used  Substance Use Topics  . Alcohol use: Yes    Alcohol/week: 4.2 - 4.8 oz    Types: 7 Glasses of wine per week  . Drug use: No    Review of Systems Constitutional: No fever/chills Eyes: No visual changes. ENT: No sore throat. Cardiovascular: Positive for chest pain. Respiratory: Positive for shortness of breath. Gastrointestinal: No abdominal pain.  No nausea, no vomiting.  No diarrhea.  No constipation. Genitourinary: Negative for dysuria. Musculoskeletal: Negative for back pain. Skin: Negative for rash. Neurological: Negative for headaches, focal weakness or  numbness.   ____________________________________________   PHYSICAL EXAM:  VITAL SIGNS: ED Triage Vitals  Enc Vitals Group     BP 10/01/17 1408 (!) 145/55     Pulse Rate 10/01/17 1408 86     Resp 10/01/17 1408 20     Temp 10/01/17 1408 (!) 97.4 F (36.3 C)     Temp Source 10/01/17 1408 Oral     SpO2 10/01/17 1408 94 %     Weight 10/01/17 1409 110 lb (49.9 kg)     Height 10/01/17 1409 _0  (1.727 m)     Head Circumference --      Peak Flow --      Pain Score 10/01/17 1407 4     Pain Loc --      Pain Edu? --      Excl. in [place]? --     Constitutional: Alert and oriented x4 pleasant cooperative speaks in full clear sentences no diaphoresis Eyes: PERRL EOMI. Head: Atraumatic. Nose: No congestion/rhinnorhea. Mouth/Throat: No trismus Neck: No stridor.   Cardiovascular: Normal rate, regular rhythm. Grossly normal heart sounds.  Good peripheral circulation. Respiratory: Slightly increased respiratory effort  with mild wheezes throughout although moving good air Gastrointestinal: Soft nontender Musculoskeletal: No lower extremity edema   Neurologic:  Normal speech and language. No gross focal neurologic deficits are appreciated. Skin:  Skin is warm, dry and intact. No rash noted. Psychiatric: Mood and affect are normal. Speech and behavior are normal.    ____________________________________________   DIFFERENTIAL includes but not limited to  COPD exacerbation, pneumonia, pneumothorax, pulmonary embolism ____________________________________________   LABS (all labs ordered are listed, but only abnormal results are displayed)  Labs Reviewed  CBC - Abnormal; Notable for the following components:      Result Value   WBC 19.6 (*)    All other components within normal limits  COMPREHENSIVE METABOLIC PANEL - Abnormal; Notable for the following components:   Sodium 132 (*)    Chloride 94 (*)    All other components within normal limits  TROPONIN I    Blood work reviewed  by me with elevated white count which is nonspecific and could be secondary to pain rather than true bacterial infection __________________________________________  EKG    ____________________________________________  RADIOLOGY  Chest x-ray reviewed by me with chronic changes but no acute disease ____________________________________________   PROCEDURES  Procedure(s) performed: no  Procedures  Critical Care performed: no  Observation: no ____________________________________________   INITIAL IMPRESSION / ASSESSMENT AND PLAN / ED COURSE  Pertinent labs & imaging results that were available during my care of the patient were reviewed by me and considered in my medical decision making (see chart for details).  By the time I saw the patient she had already had a breathing treatment and her symptoms were significantly improved.  She is saturating well on 2 L.  I had a lengthy discussion with the patient and her husband at bedside regarding antibiotics and steroids for her acute exacerbation.  Her pain is improved after breathing treatment.  Strict return precautions have been given and patient verbalized understanding and agreement the plan.     ----------------------------------------- 5:00 PM on 10/01/2017 -----------------------------------------  I was called by the pharmacist that azithromycin interacts negatively with 1 of the patient's cardiac medications.  Instead I switched her to doxycycline 100 mg by mouth twice a day for 5 days total of 10 tabs. ____________________________________________   FINAL CLINICAL IMPRESSION(S) / ED DIAGNOSES  Final diagnoses:  COPD exacerbation ([place])      NEW MEDICATIONS STARTED DURING THIS VISIT:  This SmartLink is deprecated. Use AVSMEDLIST instead to display the medication list for a patient.   Note:  This document was prepared using Dragon voice recognition software and may include unintentional dictation errors.      Darel Hong, MD 10/01/17 1546    Darel Hong, MD 10/01/17 1700    Darel Hong, MD 10/10/17 [phone]

## 2017-10-01 NOTE — ED Triage Notes (Signed)
Increased SOB today. States normally on 02 2l, now on 02 4l to maintain SAT 94.  Denies fevers

## 2017-10-01 NOTE — Discharge Instructions (Signed)
Please take 4 more days of steroids and complete all of your antibiotics as prescribed.  Make sure you follow-up with your primary care physician this coming Monday for reevaluation and return to the department sooner for any concerns whatsoever.  It was a pleasure to take care of you today, and thank you for coming to our emergency department.  If you have any questions or concerns before leaving please ask the nurse to grab me and I'm more than happy to go through your aftercare instructions again.  If you were prescribed any opioid pain medication today such as Norco, Vicodin, Percocet, morphine, hydrocodone, or oxycodone please make sure you do not drive when you are taking this medication as it can alter your ability to drive safely.  If you have any concerns once you are home that you are not improving or are in fact getting worse before you can make it to your follow-up appointment, please do not hesitate to call 911 and come back for further evaluation.  Darel Hong, MD  Results for orders placed or performed during the hospital encounter of 10/01/17  CBC  Result Value Ref Range   WBC 19.6 (H) 3.6 - 11.0 K/uL   RBC 4.41 3.80 - 5.20 MIL/uL   Hemoglobin 13.9 12.0 - 16.0 g/dL   HCT 41.2 35.0 - 47.0 %   MCV 93.4 80.0 - 100.0 fL   MCH 31.7 26.0 - 34.0 pg   MCHC 33.9 32.0 - 36.0 g/dL   RDW 14.0 11.5 - 14.5 %   Platelets 342 150 - 440 K/uL  Troponin I  Result Value Ref Range   Troponin I <0.03 <0.03 ng/mL  Comprehensive metabolic panel  Result Value Ref Range   Sodium 132 (L) 135 - 145 mmol/L   Potassium 4.1 3.5 - 5.1 mmol/L   Chloride 94 (L) 101 - 111 mmol/L   CO2 29 22 - 32 mmol/L   Glucose, Bld 79 65 - 99 mg/dL   BUN 12 6 - 20 mg/dL   Creatinine, Ser 0.77 0.44 - 1.00 mg/dL   Calcium 8.9 8.9 - 10.3 mg/dL   Total Protein 8.1 6.5 - 8.1 g/dL   Albumin 3.6 3.5 - 5.0 g/dL   AST 28 15 - 41 U/L   ALT 14 14 - 54 U/L   Alkaline Phosphatase 91 38 - 126 U/L   Total Bilirubin 0.9  0.3 - 1.2 mg/dL   GFR calc non Af Amer >60 >60 mL/min   GFR calc Af Amer >60 >60 mL/min   Anion gap 9 5 - 15   Dg Chest 2 View  Result Date: 10/01/2017 CLINICAL DATA:  Shortness of breath. EXAM: CHEST  2 VIEW COMPARISON:  Radiographs of August 30, 2017. FINDINGS: The heart size and mediastinal contours are within normal limits. No pneumothorax or pleural effusion is noted. Stable reticular and nodular densities are noted throughout both lungs consistent with prior mycobacterium infection. No new opacities are noted. The visualized skeletal structures are unremarkable. IMPRESSION: Stable bilateral lung opacities are noted most consistent with history of prior mycobacterium infection. No significant changes noted compared to prior exam. Electronically Signed   By: Marijo Conception, M.D.   On: 10/01/2017 14:53

## 2017-10-03 NOTE — Telephone Encounter (Signed)
Left message for patient to call back  

## 2017-10-03 NOTE — Telephone Encounter (Signed)
Spoke with patient and advised her that [facility] does not offer pulmonary rehab and she would need to go to [facility] for rehab. She stated that she has already gone to pulmonary rehab and felt it did not help her. She stated that she is not driving now and simply getting there and back would be a major task. Offered to help patient to get setup with transportation (if [facility] offers this, or other services within [facility]) but she declined.   Patient also wanted to let BQ know that she was in the hospital this past week.   She also wanted to know if being on a daily maintence dose of 10mg  prednisone would benefit her.   BQ, please advise. Thanks!

## 2017-10-03 NOTE — Telephone Encounter (Signed)
This is what I suspected.  They don't offer pulmonary rehab there.  This is a very specific program offered as hospitals.  In her case I recommend she try pulmonary rehab at [facility].

## 2017-10-03 NOTE — Telephone Encounter (Signed)
Please advise BQ.

## 2017-10-03 NOTE — Telephone Encounter (Signed)
Unlikely that prednisone would help Sounds like she needs an office visit

## 2017-10-04 ENCOUNTER — Telehealth: Payer: Self-pay | Admitting: Pulmonary Disease

## 2017-10-04 ENCOUNTER — Other Ambulatory Visit: Payer: Self-pay

## 2017-10-04 MED ORDER — APIXABAN 2.5 MG PO TABS: 2.5000 mg | ORAL_TABLET | Freq: Two times a day (BID) | ORAL | 3 refills | Status: DC

## 2017-10-04 MED ORDER — ALBUTEROL SULFATE HFA 108 (90 BASE) MCG/ACT IN AERS: 2.0000 | INHALATION_SPRAY | RESPIRATORY_TRACT | 2 refills | Status: DC | PRN

## 2017-10-04 NOTE — Telephone Encounter (Signed)
Pt has been scheduled for OV with BQ on 10/06/17 @ 2:30. Pt also states that BQ recommended pulm rehab. Pt does not think she could attend pulm rehab at [facility] at this time. Pt wanted to know if BQ could order for PT at [facility] to build her strength up to eventually attend pulm rehab.   BQ please advise. Thanks.

## 2017-10-04 NOTE — Telephone Encounter (Signed)
Patient calling back - states that she has spoken with the nurse and forgot to mention an important thing. She would like a call back at [phone] -pr

## 2017-10-04 NOTE — Telephone Encounter (Signed)
Pt states she is having some improvement with prednisone and zpak.  Rx for ventolin has been sent to preferred pharmacy. Nothing further needed.

## 2017-10-04 NOTE — Telephone Encounter (Signed)
Called and spoke with pt. Pt reports of increased sob, mild wheezing & prod cough with pinkish to brown mucus x4d. Pt currently on 3L O2. Pt states spO2 is maintaining around 93% on 3L.  Pt had ED visit on 10/01/17 for COPDE. Pt states she was given albuterol neb while at ED, and had improvement in breathing. Pt is requesting albuterol HFA to be sent to [facility] in [place].   RB please advise, as BQ is unavailable. Thanks.     Allergies  Allergen Reactions  . Ciprofloxacin Other (See Comments)    c-diff  . Isoniazid     Hepatitis, weakness, nausea  . Moxifloxacin     Avelox  REACTION: chills, fainting

## 2017-10-04 NOTE — Telephone Encounter (Signed)
Reviewed the ED note.  Apparently she received prednisone and antibiotics, is clinically improving.  It is okay to prescribe albuterol HFA, 2 puffs up to every 4 hours if needed for shortness of breath, wheezing, chest tightness.

## 2017-10-05 NOTE — Telephone Encounter (Signed)
Order sent to [facility]. Fax number [phone]

## 2017-10-05 NOTE — Telephone Encounter (Signed)
OK to send order for PT

## 2017-10-06 ENCOUNTER — Ambulatory Visit (INDEPENDENT_AMBULATORY_CARE_PROVIDER_SITE_OTHER): Payer: Medicare Other | Admitting: Pulmonary Disease

## 2017-10-06 VITALS — BP 132/64 | HR 91

## 2017-10-06 DIAGNOSIS — J9611 Chronic respiratory failure with hypoxia: Secondary | ICD-10-CM | POA: Diagnosis not present

## 2017-10-06 DIAGNOSIS — R0902 Hypoxemia: Secondary | ICD-10-CM

## 2017-10-06 DIAGNOSIS — Z9981 Dependence on supplemental oxygen: Secondary | ICD-10-CM | POA: Diagnosis not present

## 2017-10-06 DIAGNOSIS — J411 Mucopurulent chronic bronchitis: Secondary | ICD-10-CM | POA: Diagnosis not present

## 2017-10-06 DIAGNOSIS — J479 Bronchiectasis, uncomplicated: Secondary | ICD-10-CM | POA: Diagnosis not present

## 2017-10-06 MED ORDER — IPRATROPIUM-ALBUTEROL 0.5-2.5 (3) MG/3ML IN SOLN: 3.0000 mL | RESPIRATORY_TRACT | 5 refills | Status: DC | PRN

## 2017-10-06 NOTE — Patient Instructions (Signed)
Bronchiectasis causing chronic respiratory failure with hypoxemia: Continue Pulmicort Continue Brovana Use DuoNeb every 4 hours as needed for shortness of breath Use 2-3 L of oxygen at rest to maintain your O2 saturation greater than 90% We will refer you to hospice of [facility], I will be your hospice doctor  We will schedule an appointment for 6 weeks from now though if you are not feeling up to it feel free to cancel the visit.

## 2017-10-06 NOTE — Progress Notes (Signed)
Subjective:    Patient ID: Hannah Kline, female    DOB: 1932-01-27, 83 y.o.   MRN: [id number] Synopsis: Hannah Kline established care with the [facility] pulmonary office in February 2013 for bronchiectasis due to [place]. She was diagnosed at age 25 in [place]. She was treated with rifampin ethambutol and clarithromycin for 2 years. Since then she does not believe she was ever grown out MAI.  She had c.diff colitis in 2015 which was refractory to treatment.    HPI  Chief Complaint  Patient presents with  . Acute Visit    pt c/o worsening sob with any exertion and at rest, prod cough with yellow mucus often tinged with blood.    Hannah Kline has been struggling recently.  Specifically she has been having worsening shortness of breath.  She recently went to the emergency room where she had to be treated with a breathing treatment and antibiotics.  She says she went home and she felt a bit better after using the DuoNeb however within a day or 2 she started to feel more short of breath and cannot exercise anymore.  She says that this morning she started coughing up some blood and she is been producing more mucus today.  Of note, she was recently seen by cardiology who recommended that she stop taking her sildenafil as she has severe chronic respiratory failure.  She tells me today that she is tired and she is not willing to undergo another hospitalization.  Past Medical History:  Diagnosis Date  . Atrial fibrillation ([place])   . Bacterial infection due to Pseudomonas   . Bronchiectasis   . COPD (chronic obstructive pulmonary disease) ([place])    . Cough    from bronchiectasis neb txs  . Dysrhythmia    A Fib  . Hepatitis    h/o INH(isoniazide)  . History of Mycobacterium avium complex infection    lung disease  . Hypothyroidism   . Pleurisy 12/08/11  . Shortness of breath dyspnea      Review of Systems  Constitutional: Negative for appetite change, chills, fatigue,  fever and unexpected weight change.  HENT: Negative for congestion, ear pain and nosebleeds.   Respiratory: Negative for cough, choking and shortness of breath.   Cardiovascular: Negative for chest pain and leg swelling.       Objective:   Physical Exam Vitals:   10/06/17 1427  BP: 132/64  Pulse: 91  SpO2: 91%   2L   Gen: chronically ill appearing HENT: OP clear, TM's clear, neck supple PULM: Crackles bases B, normal percussion CV: RRR, no mgr, trace edema GI: BS+, soft, nontender Derm: no cyanosis or rash Psyche: normal mood and affect    Records from her visit with cardiology, advanced heart failure clinic reviewed where she was seen for pulmonary hypertension.  It is advised that she talk to me about goals of care.   Micro Review of 2012 sputum microbiology: Pan sensitive pseudomonas  10/11/2012 Sputum: PSEUDOMONAS AERUGINOSA       Antibiotic  Sensitivity  Microscan  Status    CEFEPIME  Sensitive  2  Final    CEFTAZIDIME  Sensitive  <=1  Final    CIPROFLOXACIN  Intermediate  2  Final    GENTAMICIN  Sensitive  <=1  Final    IMIPENEM  Sensitive  1  Final    LEVOFLOXACIN  Intermediate  4  Final    PIP/TAZO  Sensitive  <=4  Final    TOBRAMYCIN  Sensitive  <=1  Final    January 2014 Sputum AFB > neg x3   2015 Sputum> Pseudomonas , Cipro/Levofloxacin Intermediate suscept; Cefepime,Ceftaz, Imi, Pip-Tazo, Gent all susceptible  September 2015 sputum culture> Klebsiella resistant to amoxicillin but susceptible to Augmentin and multiple other agents  January - February 2016 Sputum AFB negative x 3  12/2015 AFB sputum > MAI 03/2016 AFB sputum > negative 05/2017 Sputum culture > Pseudomonas, pan sensitive  PFT 09/2010 Full PFT: Ratio 63%, FEV1 1.49L (67% pred) December 2013 simple spirometry FEV1 0.8 L January 2014 simple spirometry >> not reproducible by ATS standards 11/2013 FEV1 0.93 L April 2018 simple spirometry ratio 61%, FEV1 0.67 L 30% predicted, FVC 1.0 936%  predicted  Imaging 10/2011 CT Chest: impressive upper lobe cystic bronchiectasis, scattered lower lobe tree-in-bud abnormalities and scattered nodules; One RML solid nodule measures 7.13m  January 2013 CT chest [facility]: Stable upper lobe cystic bronchiectasis also present in the right middle lobe. Scattered tree in bud abnormalities and nodules throughout all lung fields roughly unchanged from prior. Right middle lobe nodule is no longer visible but there is an 857mnodule in the right upper lobe.  June 2018 high-resolution CT scan of the chest images independently reviewed showing tree in bud abnormalities and cylindrical bronchiectasis in the upper lobes with some nodules noted, findings worse in the right middle lobe and the lingula with subsegmental atelectasis in the right upper lobe. There is a nodule in the right base which is not changed. Overall impression is consistent with bronchiectasis and findings worrisome for an atypical lung infection like Mycobacterium avium intercellular.     Assessment & Plan:  BRONCHIECTASIS - Plan: Ambulatory referral to Hospice  Hypoxemia requiring supplemental oxygen  Chronic respiratory failure with hypoxia (HCC)  Mucopurulent chronic bronchitis (HCC)  Discussion: Unfortunately GrDails having another exacerbation of her bronchiectasis.  She has severe chronic respiratory failure with hypoxemia which is now impacted her with likely hypercarbia as she describes tremulous muscles and severe fatigue.  It also caused significant pulmonary hypertension.  Though she has very little reserve I did offer a hospitalization with IV antibiotics and implementation of BiPAP.  However, after discussion with her and her husband at length about this today she said that she would prefer to take a more conservative approach and is interested in hospice.  We discussed this at length today.  I told her that I thought that opiate medications would help relieve her sensation of  shortness of breath.  Plan: Bronchiectasis causing chronic respiratory failure with hypoxemia: Continue Pulmicort Continue Brovana Use DuoNeb every 4 hours as needed for shortness of breath Use 2-3 L of oxygen at rest to maintain your O2 saturation greater than 90% [facility]  We will schedule an appointment for 6 weeks from now though if you are not feeling up to it feel free to cancel the visit.  > 50% of this 44 minute visit spent face to face     Current Outpatient Medications:  .  albuterol (PROVENTIL HFA;VENTOLIN HFA) 108 (90 Base) MCG/ACT inhaler, Inhale 2 puffs into the lungs every 4 (four) hours as needed for wheezing or shortness of breath., Disp: 1 Inhaler, Rfl: 2 .  apixaban (ELIQUIS) 2.5 MG TABS tablet, Take 1 tablet (2.5 mg total) by mouth 2 (two) times daily., Disp: 180 tablet, Rfl: 3 .  arformoterol (BROVANA) 15 MCG/2ML NEBU, Take 2 mLs (15 mcg total) by  nebulization 2 (two) times daily., Disp: 120 mL, Rfl: 11 .  budesonide (PULMICORT) 0.5 MG/2ML nebulizer solution, Take 2 mLs (0.5 mg total) by nebulization 2 (two) times daily., Disp: 120 mL, Rfl: 11 .  diltiazem (CARDIZEM) 30 MG tablet, Take 1 tablet (30 mg total) by mouth 3 (three) times daily as needed (atrial fibrillation)., Disp: 270 tablet, Rfl: 3 .  dorzolamide (TRUSOPT) 2 % ophthalmic solution, Place 1 drop into the left eye 2 (two) times daily., Disp: , Rfl: 7 .  levothyroxine (SYNTHROID, LEVOTHROID) 75 MCG tablet, Take 1 tablet (75 mcg total) by mouth daily., Disp: 90 tablet, Rfl: 0 .  OXYGEN, Inhale 2 L into the lungs. [place] - when exercising, walking around, and at night, Disp: , Rfl:  .  propafenone (RYTHMOL) 150 MG tablet, Take 1 tablet (150 mg total) by mouth 2 (two) times daily., Disp: 60 tablet, Rfl: 6 .  Respiratory Therapy Supplies (FLUTTER) DEVI, Use as directed, Disp: 1 each, Rfl: 0

## 2017-10-07 DIAGNOSIS — E039 Hypothyroidism, unspecified: Secondary | ICD-10-CM | POA: Diagnosis not present

## 2017-10-07 DIAGNOSIS — J9621 Acute and chronic respiratory failure with hypoxia: Secondary | ICD-10-CM | POA: Diagnosis not present

## 2017-10-07 DIAGNOSIS — I4891 Unspecified atrial fibrillation: Secondary | ICD-10-CM | POA: Diagnosis not present

## 2017-10-07 DIAGNOSIS — Z8619 Personal history of other infectious and parasitic diseases: Secondary | ICD-10-CM | POA: Diagnosis not present

## 2017-10-07 DIAGNOSIS — Z9981 Dependence on supplemental oxygen: Secondary | ICD-10-CM | POA: Diagnosis not present

## 2017-10-07 DIAGNOSIS — I272 Pulmonary hypertension, unspecified: Secondary | ICD-10-CM | POA: Diagnosis not present

## 2017-10-07 DIAGNOSIS — J449 Chronic obstructive pulmonary disease, unspecified: Secondary | ICD-10-CM | POA: Diagnosis not present

## 2017-10-07 DIAGNOSIS — J479 Bronchiectasis, uncomplicated: Secondary | ICD-10-CM | POA: Diagnosis not present

## 2017-10-08 DIAGNOSIS — J479 Bronchiectasis, uncomplicated: Secondary | ICD-10-CM | POA: Diagnosis not present

## 2017-10-08 DIAGNOSIS — Z9981 Dependence on supplemental oxygen: Secondary | ICD-10-CM | POA: Diagnosis not present

## 2017-10-08 DIAGNOSIS — J449 Chronic obstructive pulmonary disease, unspecified: Secondary | ICD-10-CM | POA: Diagnosis not present

## 2017-10-08 DIAGNOSIS — E039 Hypothyroidism, unspecified: Secondary | ICD-10-CM | POA: Diagnosis not present

## 2017-10-08 DIAGNOSIS — Z8619 Personal history of other infectious and parasitic diseases: Secondary | ICD-10-CM | POA: Diagnosis not present

## 2017-10-08 DIAGNOSIS — I4891 Unspecified atrial fibrillation: Secondary | ICD-10-CM | POA: Diagnosis not present

## 2017-10-08 DIAGNOSIS — J9621 Acute and chronic respiratory failure with hypoxia: Secondary | ICD-10-CM | POA: Diagnosis not present

## 2017-10-08 DIAGNOSIS — I272 Pulmonary hypertension, unspecified: Secondary | ICD-10-CM | POA: Diagnosis not present

## 2017-10-10 DIAGNOSIS — Z9981 Dependence on supplemental oxygen: Secondary | ICD-10-CM | POA: Diagnosis not present

## 2017-10-10 DIAGNOSIS — J449 Chronic obstructive pulmonary disease, unspecified: Secondary | ICD-10-CM | POA: Diagnosis not present

## 2017-10-10 DIAGNOSIS — J9621 Acute and chronic respiratory failure with hypoxia: Secondary | ICD-10-CM | POA: Diagnosis not present

## 2017-10-10 DIAGNOSIS — I272 Pulmonary hypertension, unspecified: Secondary | ICD-10-CM | POA: Diagnosis not present

## 2017-10-10 DIAGNOSIS — J479 Bronchiectasis, uncomplicated: Secondary | ICD-10-CM | POA: Diagnosis not present

## 2017-10-10 DIAGNOSIS — I4891 Unspecified atrial fibrillation: Secondary | ICD-10-CM | POA: Diagnosis not present

## 2017-10-11 ENCOUNTER — Telehealth: Payer: Self-pay | Admitting: Pulmonary Disease

## 2017-10-11 DIAGNOSIS — I272 Pulmonary hypertension, unspecified: Secondary | ICD-10-CM | POA: Diagnosis not present

## 2017-10-11 DIAGNOSIS — Z9981 Dependence on supplemental oxygen: Secondary | ICD-10-CM | POA: Diagnosis not present

## 2017-10-11 DIAGNOSIS — J9621 Acute and chronic respiratory failure with hypoxia: Secondary | ICD-10-CM | POA: Diagnosis not present

## 2017-10-11 DIAGNOSIS — J479 Bronchiectasis, uncomplicated: Secondary | ICD-10-CM | POA: Diagnosis not present

## 2017-10-11 DIAGNOSIS — J449 Chronic obstructive pulmonary disease, unspecified: Secondary | ICD-10-CM | POA: Diagnosis not present

## 2017-10-11 DIAGNOSIS — I4891 Unspecified atrial fibrillation: Secondary | ICD-10-CM | POA: Diagnosis not present

## 2017-10-11 NOTE — Telephone Encounter (Signed)
This paperwork is in BQ cubby.

## 2017-10-12 NOTE — Telephone Encounter (Signed)
Will complete 

## 2017-10-12 NOTE — Telephone Encounter (Signed)
I will forward to [facility] box.

## 2017-10-13 ENCOUNTER — Encounter: Payer: Self-pay | Admitting: Internal Medicine

## 2017-10-13 ENCOUNTER — Telehealth: Payer: Self-pay

## 2017-10-13 ENCOUNTER — Ambulatory Visit (INDEPENDENT_AMBULATORY_CARE_PROVIDER_SITE_OTHER): Admitting: Internal Medicine

## 2017-10-13 DIAGNOSIS — J9611 Chronic respiratory failure with hypoxia: Secondary | ICD-10-CM | POA: Diagnosis not present

## 2017-10-13 DIAGNOSIS — L509 Urticaria, unspecified: Secondary | ICD-10-CM

## 2017-10-13 DIAGNOSIS — J479 Bronchiectasis, uncomplicated: Secondary | ICD-10-CM | POA: Diagnosis not present

## 2017-10-13 DIAGNOSIS — J9621 Acute and chronic respiratory failure with hypoxia: Secondary | ICD-10-CM | POA: Diagnosis not present

## 2017-10-13 DIAGNOSIS — Z9981 Dependence on supplemental oxygen: Secondary | ICD-10-CM | POA: Diagnosis not present

## 2017-10-13 DIAGNOSIS — J449 Chronic obstructive pulmonary disease, unspecified: Secondary | ICD-10-CM | POA: Diagnosis not present

## 2017-10-13 DIAGNOSIS — I272 Pulmonary hypertension, unspecified: Secondary | ICD-10-CM | POA: Diagnosis not present

## 2017-10-13 DIAGNOSIS — I4891 Unspecified atrial fibrillation: Secondary | ICD-10-CM | POA: Diagnosis not present

## 2017-10-13 MED ORDER — MONTELUKAST SODIUM 10 MG PO TABS: 10.0000 mg | ORAL_TABLET | Freq: Every day | ORAL | 3 refills | Status: DC

## 2017-10-13 MED ORDER — PREDNISONE 20 MG PO TABS: 20.0000 mg | ORAL_TABLET | Freq: Every day | ORAL | Status: DC

## 2017-10-13 NOTE — Patient Instructions (Addendum)
  You might want to try a premixed protein drink called Premier Protein shake in the morning.  It is less $$$ and very low sugar.    160 cal  30 g protein  1 g sugar 50% calcium needs   Try the vanilla ,  Chocolate and caramel flavors    Try ora gel and Biotene for your tongue pain and dry mouth   Try daily singulair to prevent hives.    Keep the prednisone 5 day treatment for failure to resolve

## 2017-10-13 NOTE — Telephone Encounter (Signed)
Copied from [place] [id number]. Topic: Inquiry >> Oct 13, 2017 12:24 PM Aurelio Brash B wrote: CRM for notification. See Telephone encounter for:   PT wants to know if Dr Derrel Nip to sign a DNR for her 10/13/17.

## 2017-10-13 NOTE — Progress Notes (Signed)
Subjective:  Patient ID: Hannah Kline, female    DOB: 06-01-1932  Age: 83 y.o. MRN: [id number]  CC: Diagnoses of Chronic respiratory failure with hypoxia (HCC) and Urticarial dermatitis were pertinent to this visit.  HPI. Hannah Kline presents for FOLLOW UP ON RESPIRATORY FAILURE,  CHRONIC, WITH RECENT ADMISSION TO HOSPICE. She is accompanied by her husband today.      She is Wearing her oxygen  But has become increasingly dyspneic and fatigued with exertion and has not been able to participate in any exercise programs,  Which has left her very pessimistic.   Discussed appetite,  Goals of care. She has requested  That I update her status to DNR status.   Still having hives. Was seen by Dr Kellie Moor during a flare and the hives were confirmed.  She  Was prescribed mometasone prn,  Did not feel that zyrtec helped at all.  No prior trial of singular,  Has deferred use of prednisone .    Outpatient Medications Prior to Visit  Medication Sig Dispense Refill  . albuterol (PROVENTIL HFA;VENTOLIN HFA) 108 (90 Base) MCG/ACT inhaler Inhale 2 puffs into the lungs every 4 (four) hours as needed for wheezing or shortness of breath. 1 Inhaler 2  . apixaban (ELIQUIS) 2.5 MG TABS tablet Take 1 tablet (2.5 mg total) by mouth 2 (two) times daily. 180 tablet 3  . arformoterol (BROVANA) 15 MCG/2ML NEBU Take 2 mLs (15 mcg total) by nebulization 2 (two) times daily. 120 mL 11  . budesonide (PULMICORT) 0.5 MG/2ML nebulizer solution Take 2 mLs (0.5 mg total) by nebulization 2 (two) times daily. 120 mL 11  . diltiazem (CARDIZEM) 30 MG tablet Take 1 tablet (30 mg total) by mouth 3 (three) times daily as needed (atrial fibrillation). 270 tablet 3  . dorzolamide (TRUSOPT) 2 % ophthalmic solution Place 1 drop into the left eye 2 (two) times daily.  7  . levothyroxine (SYNTHROID, LEVOTHROID) 75 MCG tablet Take 1 tablet (75 mcg total) by mouth daily. 90 tablet 0  . OXYGEN Inhale 2 L into the lungs. [place] - when exercising,  walking around, and at night    . propafenone (RYTHMOL) 150 MG tablet Take 1 tablet (150 mg total) by mouth 2 (two) times daily. 60 tablet 6  . Respiratory Therapy Supplies (FLUTTER) DEVI Use as directed 1 each 0  . ipratropium-albuterol (DUONEB) 0.5-2.5 (3) MG/3ML SOLN Take 3 mLs by nebulization every 4 (four) hours as needed. (Patient not taking: Reported on 10/13/2017) 360 mL 5   No facility-administered medications prior to visit.     Review of Systems;  Patient denies headache, fevers, malaise, unintentional weight loss, skin rash, eye pain, sinus congestion and sinus pain, sore throat, dysphagia,  hemoptysis , cough, dyspnea, wheezing, chest pain, palpitations, orthopnea, edema, abdominal pain, nausea, melena, diarrhea, constipation, flank pain, dysuria, hematuria, urinary  Frequency, nocturia, numbness, tingling, seizures,  Focal weakness, Loss of consciousness,  Tremor, insomnia, depression, anxiety, and suicidal ideation.      Objective:  BP 112/66 (BP Location: Left Arm, Patient Position: Sitting, Cuff Size: Normal)   Pulse 75   Temp 97.6 F (36.4 C) (Oral)   Resp 16   Ht 5\' 8"  (1.727 m)   Wt 107 lb (48.5 kg)   SpO2 97%   BMI 16.27 kg/m   BP Readings from Last 3 Encounters:  10/13/17 112/66  10/06/17 132/64  10/01/17 125/67    Wt Readings from Last 3 Encounters:  10/13/17 107 lb (48.5 kg)  10/01/17 110 lb (49.9 kg)  09/05/17 114 lb 3.2 oz (51.8 kg)    General appearance: alert, cooperative and appears stated age Ears: normal TM's and external ear canals both ears Throat: lips, mucosa, and tongue normal; teeth and gums normal Neck: no adenopathy, no carotid bruit, supple, symmetrical, trachea midline and thyroid not enlarged, symmetric, no tenderness/mass/nodules Back: symmetric, no curvature. ROM normal. No CVA tenderness. Lungs: clear to auscultation bilaterally Heart: regular rate and rhythm, S1, S2 normal, no murmur, click, rub or gallop Abdomen: soft,  non-tender; bowel sounds normal; no masses,  no organomegaly Pulses: 2+ and symmetric Skin: Skin color, texture, turgor normal. No rashes or lesions Lymph nodes: Cervical, supraclavicular, and axillary nodes normal.  Lab Results  Component Value Date   HGBA1C 5.8 (H) 11/22/2014    Lab Results  Component Value Date   CREATININE 0.77 10/01/2017   CREATININE 0.79 08/30/2017   CREATININE 0.82 07/27/2017    Lab Results  Component Value Date   WBC 19.6 (H) 10/01/2017   HGB 13.9 10/01/2017   HCT 41.2 10/01/2017   PLT 342 10/01/2017   GLUCOSE 79 10/01/2017   CHOL 177 01/22/2016   TRIG 78.0 01/22/2016   HDL 53.50 01/22/2016   LDLCALC 108 (H) 01/22/2016   ALT 14 10/01/2017   AST 28 10/01/2017   NA 132 (L) 10/01/2017   K 4.1 10/01/2017   CL 94 (L) 10/01/2017   CREATININE 0.77 10/01/2017   BUN 12 10/01/2017   CO2 29 10/01/2017   TSH 3.55 07/27/2017   INR 1.22 06/14/2016   HGBA1C 5.8 (H) 11/22/2014    Dg Chest 2 View  Result Date: 10/01/2017 CLINICAL DATA:  Shortness of breath. EXAM: CHEST  2 VIEW COMPARISON:  Radiographs of August 30, 2017. FINDINGS: The heart size and mediastinal contours are within normal limits. No pneumothorax or pleural effusion is noted. Stable reticular and nodular densities are noted throughout both lungs consistent with prior mycobacterium infection. No new opacities are noted. The visualized skeletal structures are unremarkable. IMPRESSION: Stable bilateral lung opacities are noted most consistent with history of prior mycobacterium infection. No significant changes noted compared to prior exam. Electronically Signed   By: Marijo Conception, M.D.   On: 10/01/2017 14:53    Assessment & Plan:   Problem List Items Addressed This Visit    Respiratory failure, chronic ([place])    Secondary to bronchiectasis and pulmonary hypertension .supplemental oxygen requiring .   DNR status requested by patient; chart updated.  Has Hospice per patient request       Urticarial dermatitis    With recurrent hives,  no improvement with zyrtec, and s/e of dry mouth were too disturbing. Medications reviewed,  The hives started prior to use of Eliquis and propafenone.   continue mometasone,  Trial of singulair.         A total of 25 minutes of face to face time was spent with patient more than half of which was spent in counselling about the above mentioned conditions  and coordination of care  I have discontinued Kyndal Reamer's ipratropium-albuterol. I am also having her start on montelukast and predniSONE. Additionally, I am having her maintain her OXYGEN, diltiazem, budesonide, arformoterol, dorzolamide, levothyroxine, propafenone, FLUTTER, apixaban, and albuterol.  Meds ordered this encounter  Medications  . montelukast (SINGULAIR) 10 MG tablet    Sig: Take 1 tablet (10 mg total) by mouth at bedtime.    Dispense:  30 tablet    Refill:  3  .  predniSONE (DELTASONE) 20 MG tablet    Sig: Take 1 tablet (20 mg total) by mouth daily with breakfast. As needed for hives    Dispense:  5 tablet    Refill:  02    Medications Discontinued During This Encounter  Medication Reason  . ipratropium-albuterol (DUONEB) 0.5-2.5 (3) MG/3ML SOLN Change in therapy    Follow-up: Return in about 3 months (around 01/11/2018).   Crecencio Mc, MD

## 2017-10-13 NOTE — Telephone Encounter (Deleted)
Copied from [place] [id number]. Topic: Inquiry >> Oct 13, 2017 12:24 PM Aurelio Brash B wrote: CRM for notification. See Telephone encounter for:   PT wants to know if Dr Derrel Nip to sign a DNR for her 10/13/17.

## 2017-10-14 DIAGNOSIS — I272 Pulmonary hypertension, unspecified: Secondary | ICD-10-CM | POA: Diagnosis not present

## 2017-10-14 DIAGNOSIS — J449 Chronic obstructive pulmonary disease, unspecified: Secondary | ICD-10-CM | POA: Diagnosis not present

## 2017-10-14 DIAGNOSIS — J9621 Acute and chronic respiratory failure with hypoxia: Secondary | ICD-10-CM | POA: Diagnosis not present

## 2017-10-14 DIAGNOSIS — J479 Bronchiectasis, uncomplicated: Secondary | ICD-10-CM | POA: Diagnosis not present

## 2017-10-14 DIAGNOSIS — I4891 Unspecified atrial fibrillation: Secondary | ICD-10-CM | POA: Diagnosis not present

## 2017-10-14 DIAGNOSIS — Z9981 Dependence on supplemental oxygen: Secondary | ICD-10-CM | POA: Diagnosis not present

## 2017-10-14 NOTE — Telephone Encounter (Signed)
Called patient and notified her PCP not in office, patient advised they had not discussed at appointment but that  I would make PCP aware that patient is requesting DNR and would see if PCP still required another appointment to discuss.

## 2017-10-14 NOTE — Telephone Encounter (Signed)
Yes, I will sign  No appt needed . plesase  put in red folder.

## 2017-10-14 NOTE — Telephone Encounter (Signed)
In red folder. 

## 2017-10-15 DIAGNOSIS — J9610 Chronic respiratory failure, unspecified whether with hypoxia or hypercapnia: Secondary | ICD-10-CM | POA: Insufficient documentation

## 2017-10-15 NOTE — Assessment & Plan Note (Addendum)
Secondary to bronchiectasis and pulmonary hypertension .supplemental oxygen requiring .   DNR status requested by patient; chart updated.  Has Hospice per patient request

## 2017-10-15 NOTE — Assessment & Plan Note (Addendum)
With recurrent hives,  no improvement with zyrtec, and s/e of dry mouth were too disturbing. Medications reviewed,  The hives started prior to use of Eliquis and propafenone.   continue mometasone,  Trial of singulair.

## 2017-10-19 ENCOUNTER — Telehealth: Payer: Self-pay | Admitting: Pulmonary Disease

## 2017-10-19 DIAGNOSIS — I272 Pulmonary hypertension, unspecified: Secondary | ICD-10-CM | POA: Diagnosis not present

## 2017-10-19 DIAGNOSIS — J9621 Acute and chronic respiratory failure with hypoxia: Secondary | ICD-10-CM | POA: Diagnosis not present

## 2017-10-19 DIAGNOSIS — Z9981 Dependence on supplemental oxygen: Secondary | ICD-10-CM | POA: Diagnosis not present

## 2017-10-19 DIAGNOSIS — J449 Chronic obstructive pulmonary disease, unspecified: Secondary | ICD-10-CM | POA: Diagnosis not present

## 2017-10-19 DIAGNOSIS — J479 Bronchiectasis, uncomplicated: Secondary | ICD-10-CM | POA: Diagnosis not present

## 2017-10-19 DIAGNOSIS — I4891 Unspecified atrial fibrillation: Secondary | ICD-10-CM | POA: Diagnosis not present

## 2017-10-19 NOTE — Telephone Encounter (Signed)
Called spoke with Tammy w/ Hospice Read BQ's response to her as stated below Tammy voiced her understanding  Went to update the medication list, but there is no mention of Aspirin dosage Called Tammy again w/ Hospice >> she stated that she had asked that BQ provide the dosage Dr [place] Bells please advise, thank you  **NOTE: Tammy stated that tomorrow is okay for call back as patient has already picked up her refill on the Eliquis**

## 2017-10-19 NOTE — Telephone Encounter (Signed)
Yes to aspirin instead of Eliquis Would prefer to keep her inhaled regimen as prescribed currently

## 2017-10-19 NOTE — Telephone Encounter (Signed)
Spoke with Tammy.   She stated that the pharmacy at [facility] will often times look at a patient's medication list to see if affordable medications can be substituted for current medications.   She wants to know if the patient can be switched over to DuoNebs instead of the Pulmicort. She also wants to know if the patient can stop taking Eliquis and just take aspirin instead.   BQ, please advise. Thanks!

## 2017-10-19 NOTE — Telephone Encounter (Signed)
Patient calling back [phone], Hospice already has one signed

## 2017-10-20 NOTE — Telephone Encounter (Signed)
81mg  daily

## 2017-10-20 NOTE — Telephone Encounter (Signed)
lmtcb for Tammy at [facility].

## 2017-10-20 NOTE — Telephone Encounter (Signed)
Spoke with Tammy. She is aware of the 81mg  aspirin. Nothing else needed at time of call.

## 2017-10-20 NOTE — Telephone Encounter (Signed)
Tammy from Hospice returned call said please leave message about dosage.  She is going to be attending a class.

## 2017-10-20 NOTE — Telephone Encounter (Signed)
BQ have these forms been completed?

## 2017-10-25 ENCOUNTER — Ambulatory Visit: Payer: Self-pay | Admitting: Pulmonary Disease

## 2017-10-26 DIAGNOSIS — J449 Chronic obstructive pulmonary disease, unspecified: Secondary | ICD-10-CM | POA: Diagnosis not present

## 2017-10-26 DIAGNOSIS — Z9981 Dependence on supplemental oxygen: Secondary | ICD-10-CM | POA: Diagnosis not present

## 2017-10-26 DIAGNOSIS — J479 Bronchiectasis, uncomplicated: Secondary | ICD-10-CM | POA: Diagnosis not present

## 2017-10-26 DIAGNOSIS — I4891 Unspecified atrial fibrillation: Secondary | ICD-10-CM | POA: Diagnosis not present

## 2017-10-26 DIAGNOSIS — I272 Pulmonary hypertension, unspecified: Secondary | ICD-10-CM | POA: Diagnosis not present

## 2017-10-26 DIAGNOSIS — J9621 Acute and chronic respiratory failure with hypoxia: Secondary | ICD-10-CM | POA: Diagnosis not present

## 2017-10-26 NOTE — Telephone Encounter (Signed)
BQ - please advise. Thanks. 

## 2017-10-27 NOTE — Telephone Encounter (Signed)
Form has been faxed in. Nothing further was needed.

## 2017-10-27 NOTE — Telephone Encounter (Signed)
signed

## 2017-11-02 DIAGNOSIS — I4891 Unspecified atrial fibrillation: Secondary | ICD-10-CM | POA: Diagnosis not present

## 2017-11-02 DIAGNOSIS — J9621 Acute and chronic respiratory failure with hypoxia: Secondary | ICD-10-CM | POA: Diagnosis not present

## 2017-11-02 DIAGNOSIS — J479 Bronchiectasis, uncomplicated: Secondary | ICD-10-CM | POA: Diagnosis not present

## 2017-11-02 DIAGNOSIS — Z9981 Dependence on supplemental oxygen: Secondary | ICD-10-CM | POA: Diagnosis not present

## 2017-11-02 DIAGNOSIS — I272 Pulmonary hypertension, unspecified: Secondary | ICD-10-CM | POA: Diagnosis not present

## 2017-11-02 DIAGNOSIS — J449 Chronic obstructive pulmonary disease, unspecified: Secondary | ICD-10-CM | POA: Diagnosis not present

## 2017-11-03 DIAGNOSIS — J449 Chronic obstructive pulmonary disease, unspecified: Secondary | ICD-10-CM | POA: Diagnosis not present

## 2017-11-03 DIAGNOSIS — I4891 Unspecified atrial fibrillation: Secondary | ICD-10-CM | POA: Diagnosis not present

## 2017-11-03 DIAGNOSIS — Z9981 Dependence on supplemental oxygen: Secondary | ICD-10-CM | POA: Diagnosis not present

## 2017-11-03 DIAGNOSIS — J479 Bronchiectasis, uncomplicated: Secondary | ICD-10-CM | POA: Diagnosis not present

## 2017-11-03 DIAGNOSIS — J9621 Acute and chronic respiratory failure with hypoxia: Secondary | ICD-10-CM | POA: Diagnosis not present

## 2017-11-03 DIAGNOSIS — I272 Pulmonary hypertension, unspecified: Secondary | ICD-10-CM | POA: Diagnosis not present

## 2017-11-08 DIAGNOSIS — Z9981 Dependence on supplemental oxygen: Secondary | ICD-10-CM | POA: Diagnosis not present

## 2017-11-08 DIAGNOSIS — J479 Bronchiectasis, uncomplicated: Secondary | ICD-10-CM | POA: Diagnosis not present

## 2017-11-08 DIAGNOSIS — J449 Chronic obstructive pulmonary disease, unspecified: Secondary | ICD-10-CM | POA: Diagnosis not present

## 2017-11-08 DIAGNOSIS — J9621 Acute and chronic respiratory failure with hypoxia: Secondary | ICD-10-CM | POA: Diagnosis not present

## 2017-11-08 DIAGNOSIS — E039 Hypothyroidism, unspecified: Secondary | ICD-10-CM | POA: Diagnosis not present

## 2017-11-08 DIAGNOSIS — I272 Pulmonary hypertension, unspecified: Secondary | ICD-10-CM | POA: Diagnosis not present

## 2017-11-08 DIAGNOSIS — I4891 Unspecified atrial fibrillation: Secondary | ICD-10-CM | POA: Diagnosis not present

## 2017-11-08 DIAGNOSIS — Z8619 Personal history of other infectious and parasitic diseases: Secondary | ICD-10-CM | POA: Diagnosis not present

## 2017-11-09 DIAGNOSIS — J449 Chronic obstructive pulmonary disease, unspecified: Secondary | ICD-10-CM | POA: Diagnosis not present

## 2017-11-09 DIAGNOSIS — I272 Pulmonary hypertension, unspecified: Secondary | ICD-10-CM | POA: Diagnosis not present

## 2017-11-09 DIAGNOSIS — J9621 Acute and chronic respiratory failure with hypoxia: Secondary | ICD-10-CM | POA: Diagnosis not present

## 2017-11-09 DIAGNOSIS — Z9981 Dependence on supplemental oxygen: Secondary | ICD-10-CM | POA: Diagnosis not present

## 2017-11-09 DIAGNOSIS — J479 Bronchiectasis, uncomplicated: Secondary | ICD-10-CM | POA: Diagnosis not present

## 2017-11-09 DIAGNOSIS — I4891 Unspecified atrial fibrillation: Secondary | ICD-10-CM | POA: Diagnosis not present

## 2017-11-16 DIAGNOSIS — J479 Bronchiectasis, uncomplicated: Secondary | ICD-10-CM | POA: Diagnosis not present

## 2017-11-16 DIAGNOSIS — I4891 Unspecified atrial fibrillation: Secondary | ICD-10-CM | POA: Diagnosis not present

## 2017-11-16 DIAGNOSIS — J449 Chronic obstructive pulmonary disease, unspecified: Secondary | ICD-10-CM | POA: Diagnosis not present

## 2017-11-16 DIAGNOSIS — Z9981 Dependence on supplemental oxygen: Secondary | ICD-10-CM | POA: Diagnosis not present

## 2017-11-16 DIAGNOSIS — I272 Pulmonary hypertension, unspecified: Secondary | ICD-10-CM | POA: Diagnosis not present

## 2017-11-16 DIAGNOSIS — J9621 Acute and chronic respiratory failure with hypoxia: Secondary | ICD-10-CM | POA: Diagnosis not present

## 2017-11-18 ENCOUNTER — Telehealth: Payer: Self-pay | Admitting: Pulmonary Disease

## 2017-11-18 NOTE — Telephone Encounter (Signed)
Per last AVS:   Bronchiectasis causing chronic respiratory failure with hypoxemia: Continue Pulmicort Continue Brovana Use DuoNeb every 4 hours as needed for shortness of breath Use 2-3 L of oxygen at rest to maintain your O2 saturation greater than 90% We will refer you to hospice of [facility], I will be your hospice doctor  We will schedule an appointment for 6 weeks from now though if you are not feeling up to it feel free to cancel the visit.   Spoke with patient. Advised her that per her last AVS, this follow up appt is up to her. She stated that she has been feeling fine and does not need this appt. Appt has been cancelled.   Nothing else needed at time of call.

## 2017-11-22 ENCOUNTER — Ambulatory Visit: Payer: Self-pay | Admitting: Pulmonary Disease

## 2017-11-23 DIAGNOSIS — J449 Chronic obstructive pulmonary disease, unspecified: Secondary | ICD-10-CM | POA: Diagnosis not present

## 2017-11-23 DIAGNOSIS — J479 Bronchiectasis, uncomplicated: Secondary | ICD-10-CM | POA: Diagnosis not present

## 2017-11-23 DIAGNOSIS — I272 Pulmonary hypertension, unspecified: Secondary | ICD-10-CM | POA: Diagnosis not present

## 2017-11-23 DIAGNOSIS — Z9981 Dependence on supplemental oxygen: Secondary | ICD-10-CM | POA: Diagnosis not present

## 2017-11-23 DIAGNOSIS — I4891 Unspecified atrial fibrillation: Secondary | ICD-10-CM | POA: Diagnosis not present

## 2017-11-23 DIAGNOSIS — J9621 Acute and chronic respiratory failure with hypoxia: Secondary | ICD-10-CM | POA: Diagnosis not present

## 2017-11-30 DIAGNOSIS — J9621 Acute and chronic respiratory failure with hypoxia: Secondary | ICD-10-CM | POA: Diagnosis not present

## 2017-11-30 DIAGNOSIS — Z9981 Dependence on supplemental oxygen: Secondary | ICD-10-CM | POA: Diagnosis not present

## 2017-11-30 DIAGNOSIS — I4891 Unspecified atrial fibrillation: Secondary | ICD-10-CM | POA: Diagnosis not present

## 2017-11-30 DIAGNOSIS — I272 Pulmonary hypertension, unspecified: Secondary | ICD-10-CM | POA: Diagnosis not present

## 2017-11-30 DIAGNOSIS — J479 Bronchiectasis, uncomplicated: Secondary | ICD-10-CM | POA: Diagnosis not present

## 2017-11-30 DIAGNOSIS — J449 Chronic obstructive pulmonary disease, unspecified: Secondary | ICD-10-CM | POA: Diagnosis not present

## 2017-12-01 DIAGNOSIS — J449 Chronic obstructive pulmonary disease, unspecified: Secondary | ICD-10-CM | POA: Diagnosis not present

## 2017-12-01 DIAGNOSIS — I272 Pulmonary hypertension, unspecified: Secondary | ICD-10-CM | POA: Diagnosis not present

## 2017-12-01 DIAGNOSIS — J479 Bronchiectasis, uncomplicated: Secondary | ICD-10-CM | POA: Diagnosis not present

## 2017-12-01 DIAGNOSIS — I4891 Unspecified atrial fibrillation: Secondary | ICD-10-CM | POA: Diagnosis not present

## 2017-12-01 DIAGNOSIS — J9621 Acute and chronic respiratory failure with hypoxia: Secondary | ICD-10-CM | POA: Diagnosis not present

## 2017-12-01 DIAGNOSIS — Z9981 Dependence on supplemental oxygen: Secondary | ICD-10-CM | POA: Diagnosis not present

## 2017-12-02 ENCOUNTER — Telehealth: Payer: Self-pay | Admitting: Pulmonary Disease

## 2017-12-02 NOTE — Telephone Encounter (Signed)
Spoke with Tammy. She stated that the patient has been complaining of having hives all over. Tammy visited the patient this morning and did not see any. Patient was not in any distress nor seemed to be having an allergic reaction. Tammy believes it may be the patient's nerves that are causing the itching sensation.   She wants to see if BQ would be willing to prescribe her gabapentin to help with her nerves.   Wishes to use [facility] [address] in [place].   BQ, please advise. Thanks!

## 2017-12-03 NOTE — Telephone Encounter (Signed)
I've never used gabapentin for hives.  May need OV with PCP if still a problem.

## 2017-12-05 DIAGNOSIS — H401492 Capsular glaucoma with pseudoexfoliation of lens, unspecified eye, moderate stage: Secondary | ICD-10-CM | POA: Diagnosis not present

## 2017-12-05 NOTE — Telephone Encounter (Signed)
Called Hannah Kline from hospice letting her know that pt needs to go see her PCP to see if they can figure out why pt is having the hives. Told Hannah Kline that I was going to call pt to let her know this info.  Called pt letting her know the recs per BQ to call her PCP to get an appt. Pt expressed understanding. Nothing further needed at this current time.

## 2017-12-07 DIAGNOSIS — J479 Bronchiectasis, uncomplicated: Secondary | ICD-10-CM | POA: Diagnosis not present

## 2017-12-07 DIAGNOSIS — J449 Chronic obstructive pulmonary disease, unspecified: Secondary | ICD-10-CM | POA: Diagnosis not present

## 2017-12-07 DIAGNOSIS — J9621 Acute and chronic respiratory failure with hypoxia: Secondary | ICD-10-CM | POA: Diagnosis not present

## 2017-12-07 DIAGNOSIS — I272 Pulmonary hypertension, unspecified: Secondary | ICD-10-CM | POA: Diagnosis not present

## 2017-12-07 DIAGNOSIS — I4891 Unspecified atrial fibrillation: Secondary | ICD-10-CM | POA: Diagnosis not present

## 2017-12-07 DIAGNOSIS — Z9981 Dependence on supplemental oxygen: Secondary | ICD-10-CM | POA: Diagnosis not present

## 2017-12-09 DIAGNOSIS — Z681 Body mass index (BMI) 19 or less, adult: Secondary | ICD-10-CM | POA: Diagnosis not present

## 2017-12-09 DIAGNOSIS — A312 Disseminated mycobacterium avium-intracellulare complex (DMAC): Secondary | ICD-10-CM | POA: Diagnosis not present

## 2017-12-09 DIAGNOSIS — J449 Chronic obstructive pulmonary disease, unspecified: Secondary | ICD-10-CM | POA: Diagnosis not present

## 2017-12-09 DIAGNOSIS — E039 Hypothyroidism, unspecified: Secondary | ICD-10-CM | POA: Diagnosis not present

## 2017-12-09 DIAGNOSIS — R634 Abnormal weight loss: Secondary | ICD-10-CM | POA: Diagnosis not present

## 2017-12-09 DIAGNOSIS — I4891 Unspecified atrial fibrillation: Secondary | ICD-10-CM | POA: Diagnosis not present

## 2017-12-09 DIAGNOSIS — J479 Bronchiectasis, uncomplicated: Secondary | ICD-10-CM | POA: Diagnosis not present

## 2017-12-09 DIAGNOSIS — J9621 Acute and chronic respiratory failure with hypoxia: Secondary | ICD-10-CM | POA: Diagnosis not present

## 2017-12-09 DIAGNOSIS — Z9981 Dependence on supplemental oxygen: Secondary | ICD-10-CM | POA: Diagnosis not present

## 2017-12-09 DIAGNOSIS — I272 Pulmonary hypertension, unspecified: Secondary | ICD-10-CM | POA: Diagnosis not present

## 2017-12-12 ENCOUNTER — Ambulatory Visit (INDEPENDENT_AMBULATORY_CARE_PROVIDER_SITE_OTHER): Admitting: Internal Medicine

## 2017-12-12 ENCOUNTER — Encounter: Payer: Self-pay | Admitting: Internal Medicine

## 2017-12-12 DIAGNOSIS — E441 Mild protein-calorie malnutrition: Secondary | ICD-10-CM

## 2017-12-12 DIAGNOSIS — A312 Disseminated mycobacterium avium-intracellulare complex (DMAC): Secondary | ICD-10-CM | POA: Diagnosis not present

## 2017-12-12 DIAGNOSIS — L509 Urticaria, unspecified: Secondary | ICD-10-CM | POA: Diagnosis not present

## 2017-12-12 DIAGNOSIS — I272 Pulmonary hypertension, unspecified: Secondary | ICD-10-CM | POA: Diagnosis not present

## 2017-12-12 DIAGNOSIS — J479 Bronchiectasis, uncomplicated: Secondary | ICD-10-CM | POA: Diagnosis not present

## 2017-12-12 DIAGNOSIS — J449 Chronic obstructive pulmonary disease, unspecified: Secondary | ICD-10-CM | POA: Diagnosis not present

## 2017-12-12 DIAGNOSIS — J9621 Acute and chronic respiratory failure with hypoxia: Secondary | ICD-10-CM | POA: Diagnosis not present

## 2017-12-12 DIAGNOSIS — I4891 Unspecified atrial fibrillation: Secondary | ICD-10-CM | POA: Diagnosis not present

## 2017-12-12 MED ORDER — FEXOFENADINE HCL 180 MG PO TABS: 180.0000 mg | ORAL_TABLET | Freq: Four times a day (QID) | ORAL | 2 refills | Status: DC

## 2017-12-12 MED ORDER — GABAPENTIN 100 MG PO CAPS: 100.0000 mg | ORAL_CAPSULE | Freq: Three times a day (TID) | ORAL | 3 refills | Status: DC

## 2017-12-12 MED ORDER — FAMOTIDINE 20 MG PO TABS: 20.0000 mg | ORAL_TABLET | Freq: Two times a day (BID) | ORAL | 2 refills | Status: DC

## 2017-12-12 NOTE — Patient Instructions (Addendum)
For your itching, we are creating a total histamine blockade   Start taking allegra (fexofenadine) and gradually increase the dose to 180 mg four times daily  ( increase dose every 3 days until you are up to 4 times daily )   continue famotidine 20 mg two times daily  You need to use moisturizing soap  Or body wash   On your skin    Try Aveeno moisturizer with oatmeal for your skin   I will prescribe the gabapentin 100mg  three times daily

## 2017-12-12 NOTE — Progress Notes (Signed)
Subjective:  Patient ID: Hannah Kline, female    DOB: February 05, 1932  Age: 84 y.o. MRN: [id number]  CC: Diagnoses of Urticarial dermatitis, Pulmonary hypertension ([place]), and Protein-calorie malnutrition, mild ([place]) were pertinent to this visit.  HPI Hannah Kline presents for follow up on multiple issues including  chronic respiratory failure secondary to bronchiectasis with pulmonary hypertension and chronic hypoxia. Patient is 02 dependent and has entered into Hospice care at her own choosing due to progressive decline .   Wt loss of 10 s noted since October.  Appetite is fair to good, patient's diet reviewed. She prefers sweets ,  Does not like any of the protein drinks I have recommended.  Eats peanut butter,  Avocodos,  Eggs, almost daily.  Does not eat between meals.   "I'm getting tired more easily>' (even with the work of breathing.)"  Appears very anxious.  Concerned about bruising.  Has atrial fibrillation but has stopped Eliquis, still taking aspirin.  Does not want to stop aspirin because " I don't want to have a stroke."   She appears generally miserable. Her most intolerable symptoms is urticaria.  She has Persistent complaints of hives reported by patient  That occur on her shoulders and upper back,  But the itching will migrate to arms,  Legs,  And now her eyes.  She has had no signs of hives on exam by Hospice RN  But had dermatologic evaluation several months ago with a biopsy that suggested urticarial dermatitis  But her symptoms have not  responded to antihistamine,  H2 blockers or Singulair.  She has not increased dose up to 4 times the usual dose or returned to dermatology to consider trial of Zolair.     Using sarna  Lotion  On skin.  Scrubs her skin daily in the shower,  Which she states helps for an hour or two.  Outpatient Medications Prior to Visit  Medication Sig Dispense Refill  . albuterol (PROVENTIL HFA;VENTOLIN HFA) 108 (90 Base) MCG/ACT inhaler Inhale 2 puffs into the  lungs every 4 (four) hours as needed for wheezing or shortness of breath. 1 Inhaler 2  . arformoterol (BROVANA) 15 MCG/2ML NEBU Take 2 mLs (15 mcg total) by nebulization 2 (two) times daily. 120 mL 11  . budesonide (PULMICORT) 0.5 MG/2ML nebulizer solution Take 2 mLs (0.5 mg total) by nebulization 2 (two) times daily. 120 mL 11  . diltiazem (CARDIZEM) 30 MG tablet Take 1 tablet (30 mg total) by mouth 3 (three) times daily as needed (atrial fibrillation). 270 tablet 3  . dorzolamide (TRUSOPT) 2 % ophthalmic solution Place 1 drop into the left eye 2 (two) times daily.  7  . levothyroxine (SYNTHROID, LEVOTHROID) 75 MCG tablet Take 1 tablet (75 mcg total) by mouth daily. 90 tablet 0  . OXYGEN Inhale 2 L into the lungs. [place] - when exercising, walking around, and at night    . predniSONE (DELTASONE) 20 MG tablet Take 1 tablet (20 mg total) by mouth daily with breakfast. As needed for hives 5 tablet 02  . propafenone (RYTHMOL) 150 MG tablet Take 1 tablet (150 mg total) by mouth 2 (two) times daily. 60 tablet 6  . Respiratory Therapy Supplies (FLUTTER) DEVI Use as directed 1 each 0  . apixaban (ELIQUIS) 2.5 MG TABS tablet Take 1 tablet (2.5 mg total) by mouth 2 (two) times daily. (Patient not taking: Reported on 12/12/2017) 180 tablet 3  . montelukast (SINGULAIR) 10 MG tablet Take 1 tablet (10 mg total) by  mouth at bedtime. (Patient not taking: Reported on 12/12/2017) 30 tablet 3   No facility-administered medications prior to visit.     Review of Systems;  Patient denies headache, fevers,eye pain, sinus congestion and sinus pain, sore throat, dysphagia,  hemoptysis , cough,  wheezing, chest pain, palpitations, orthopnea, edema, abdominal pain, nausea, melena, diarrhea, constipation, flank pain, dysuria, hematuria, urinary  Frequency, nocturia, numbness, tingling, seizures,  Focal weakness, Loss of consciousness,  Tremor, insomnia, depression,and suicidal ideation.      Objective:  BP 116/62 (BP Location:  Left Arm, Patient Position: Sitting, Cuff Size: Normal)   Pulse 74   Temp (!) 97.5 F (36.4 C) (Oral)   Resp 16   Ht 5\' 8"  (1.727 m)   Wt 104 lb 6.4 oz (47.4 kg)   SpO2 95% Comment: 2L of O2  BMI 15.87 kg/m   BP Readings from Last 3 Encounters:  12/12/17 116/62  10/13/17 112/66  10/06/17 132/64    Wt Readings from Last 3 Encounters:  12/12/17 104 lb 6.4 oz (47.4 kg)  10/13/17 107 lb (48.5 kg)  10/01/17 110 lb (49.9 kg)    General appearance: chronically ill appearing, anxious, cooperative and appears stated age Back: kyphotic, symmetric, . ROM normal. No CVA tenderness. Lungs: clear to auscultation bilaterally Heart: regular rate and rhythm, S1, S2 normal, no murmur, click, rub or gallop Abdomen: soft, non-tender; bowel sounds normal; no masses,  no organomegaly Pulses: 2+ and symmetric Skin: dry,  Multiple ecchymoses,  No hives , No rashes or lesions Lymph nodes: Cervical, supraclavicular, and axillary nodes normal.  Lab Results  Component Value Date   HGBA1C 5.8 (H) 11/22/2014    Lab Results  Component Value Date   CREATININE 0.77 10/01/2017   CREATININE 0.79 08/30/2017   CREATININE 0.82 07/27/2017    Lab Results  Component Value Date   WBC 19.6 (H) 10/01/2017   HGB 13.9 10/01/2017   HCT 41.2 10/01/2017   PLT 342 10/01/2017   GLUCOSE 79 10/01/2017   CHOL 177 01/22/2016   TRIG 78.0 01/22/2016   HDL 53.50 01/22/2016   LDLCALC 108 (H) 01/22/2016   ALT 14 10/01/2017   AST 28 10/01/2017   NA 132 (L) 10/01/2017   K 4.1 10/01/2017   CL 94 (L) 10/01/2017   CREATININE 0.77 10/01/2017   BUN 12 10/01/2017   CO2 29 10/01/2017   TSH 3.55 07/27/2017   INR 1.22 06/14/2016   HGBA1C 5.8 (H) 11/22/2014    Dg Chest 2 View  Result Date: 10/01/2017 CLINICAL DATA:  Shortness of breath. EXAM: CHEST  2 VIEW COMPARISON:  Radiographs of August 30, 2017. FINDINGS: The heart size and mediastinal contours are within normal limits. No pneumothorax or pleural effusion is  noted. Stable reticular and nodular densities are noted throughout both lungs consistent with prior mycobacterium infection. No new opacities are noted. The visualized skeletal structures are unremarkable. IMPRESSION: Stable bilateral lung opacities are noted most consistent with history of prior mycobacterium infection. No significant changes noted compared to prior exam. Electronically Signed   By: Marijo Conception, M.D.   On: 10/01/2017 14:53    Assessment & Plan:   Problem List Items Addressed This Visit    Urticarial dermatitis    Advised to resume claritin or allegra and increase the dose gradually to 4 x the maximal dose per ACP guidelines .  Also resume H2 blocker bid.  Adding gabapentin per Hospice RN suggestion.  Follow up one month . If no improvement return to  dermatology for Xolair.       Pulmonary hypertension ([place])    secondary to bronchiectasis and COPD.  She is quite symptomatic with minimal exertion, including talking.  Continue hospice care,  Supplemental oxygen       Protein-calorie malnutrition, mild ([place])     I have reviewed her diet and recommended that she increase her protein and fat intake while monitoring her carbohydrates.        A total of 25 minutes of face to face time was spent with patient more than half of which was spent in counselling about the above mentioned conditions  and coordination of care   I am having Shirlee Limerick Lusby start on famotidine, fexofenadine, and gabapentin. I am also having her maintain her OXYGEN, diltiazem, budesonide, arformoterol, dorzolamide, levothyroxine, propafenone, FLUTTER, apixaban, albuterol, montelukast, and predniSONE.  Meds ordered this encounter  Medications  . famotidine (PEPCID) 20 MG tablet    Sig: Take 1 tablet (20 mg total) by mouth 2 (two) times daily.    Dispense:  180 tablet    Refill:  2    Dose for chronic urticaria  . fexofenadine (ALLEGRA) 180 MG tablet    Sig: Take 1 tablet (180 mg total) by mouth 4 (four)  times daily. Gradual increase    Dispense:  120 tablet    Refill:  2    For chronic urticaria  . gabapentin (NEURONTIN) 100 MG capsule    Sig: Take 1 capsule (100 mg total) by mouth 3 (three) times daily.    Dispense:  90 capsule    Refill:  3    There are no discontinued medications.  Follow-up: Return in about 4 weeks (around 01/09/2018) for chronic urticaria .   Crecencio Mc, MD

## 2017-12-13 NOTE — Assessment & Plan Note (Signed)
I have reviewed her diet and recommended that she increase her protein and fat intake while monitoring her carbohydrates.  

## 2017-12-13 NOTE — Assessment & Plan Note (Signed)
Advised to resume claritin or allegra and increase the dose gradually to 4 x the maximal dose per ACP guidelines .  Also resume H2 blocker bid.  Adding gabapentin per Hospice RN suggestion.  Follow up one month . If no improvement return to dermatology for Xolair.

## 2017-12-13 NOTE — Assessment & Plan Note (Signed)
secondary to bronchiectasis and COPD.  She is quite symptomatic with minimal exertion, including talking.  Continue hospice care,  Supplemental oxygen

## 2017-12-14 DIAGNOSIS — J479 Bronchiectasis, uncomplicated: Secondary | ICD-10-CM | POA: Diagnosis not present

## 2017-12-14 DIAGNOSIS — I272 Pulmonary hypertension, unspecified: Secondary | ICD-10-CM | POA: Diagnosis not present

## 2017-12-14 DIAGNOSIS — J9621 Acute and chronic respiratory failure with hypoxia: Secondary | ICD-10-CM | POA: Diagnosis not present

## 2017-12-14 DIAGNOSIS — I4891 Unspecified atrial fibrillation: Secondary | ICD-10-CM | POA: Diagnosis not present

## 2017-12-14 DIAGNOSIS — J449 Chronic obstructive pulmonary disease, unspecified: Secondary | ICD-10-CM | POA: Diagnosis not present

## 2017-12-14 DIAGNOSIS — A312 Disseminated mycobacterium avium-intracellulare complex (DMAC): Secondary | ICD-10-CM | POA: Diagnosis not present

## 2017-12-15 DIAGNOSIS — L821 Other seborrheic keratosis: Secondary | ICD-10-CM | POA: Diagnosis not present

## 2017-12-15 DIAGNOSIS — L308 Other specified dermatitis: Secondary | ICD-10-CM | POA: Diagnosis not present

## 2017-12-15 DIAGNOSIS — D692 Other nonthrombocytopenic purpura: Secondary | ICD-10-CM | POA: Diagnosis not present

## 2017-12-16 DIAGNOSIS — I4891 Unspecified atrial fibrillation: Secondary | ICD-10-CM | POA: Diagnosis not present

## 2017-12-16 DIAGNOSIS — A312 Disseminated mycobacterium avium-intracellulare complex (DMAC): Secondary | ICD-10-CM | POA: Diagnosis not present

## 2017-12-16 DIAGNOSIS — J449 Chronic obstructive pulmonary disease, unspecified: Secondary | ICD-10-CM | POA: Diagnosis not present

## 2017-12-16 DIAGNOSIS — J9621 Acute and chronic respiratory failure with hypoxia: Secondary | ICD-10-CM | POA: Diagnosis not present

## 2017-12-16 DIAGNOSIS — I272 Pulmonary hypertension, unspecified: Secondary | ICD-10-CM | POA: Diagnosis not present

## 2017-12-16 DIAGNOSIS — J479 Bronchiectasis, uncomplicated: Secondary | ICD-10-CM | POA: Diagnosis not present

## 2017-12-21 DIAGNOSIS — I4891 Unspecified atrial fibrillation: Secondary | ICD-10-CM | POA: Diagnosis not present

## 2017-12-21 DIAGNOSIS — A312 Disseminated mycobacterium avium-intracellulare complex (DMAC): Secondary | ICD-10-CM | POA: Diagnosis not present

## 2017-12-21 DIAGNOSIS — I272 Pulmonary hypertension, unspecified: Secondary | ICD-10-CM | POA: Diagnosis not present

## 2017-12-21 DIAGNOSIS — J479 Bronchiectasis, uncomplicated: Secondary | ICD-10-CM | POA: Diagnosis not present

## 2017-12-21 DIAGNOSIS — J9621 Acute and chronic respiratory failure with hypoxia: Secondary | ICD-10-CM | POA: Diagnosis not present

## 2017-12-21 DIAGNOSIS — J449 Chronic obstructive pulmonary disease, unspecified: Secondary | ICD-10-CM | POA: Diagnosis not present

## 2017-12-28 DIAGNOSIS — A312 Disseminated mycobacterium avium-intracellulare complex (DMAC): Secondary | ICD-10-CM | POA: Diagnosis not present

## 2017-12-28 DIAGNOSIS — I4891 Unspecified atrial fibrillation: Secondary | ICD-10-CM | POA: Diagnosis not present

## 2017-12-28 DIAGNOSIS — J479 Bronchiectasis, uncomplicated: Secondary | ICD-10-CM | POA: Diagnosis not present

## 2017-12-28 DIAGNOSIS — I272 Pulmonary hypertension, unspecified: Secondary | ICD-10-CM | POA: Diagnosis not present

## 2017-12-28 DIAGNOSIS — J9621 Acute and chronic respiratory failure with hypoxia: Secondary | ICD-10-CM | POA: Diagnosis not present

## 2017-12-28 DIAGNOSIS — J449 Chronic obstructive pulmonary disease, unspecified: Secondary | ICD-10-CM | POA: Diagnosis not present

## 2018-01-04 DIAGNOSIS — J479 Bronchiectasis, uncomplicated: Secondary | ICD-10-CM | POA: Diagnosis not present

## 2018-01-04 DIAGNOSIS — I4891 Unspecified atrial fibrillation: Secondary | ICD-10-CM | POA: Diagnosis not present

## 2018-01-04 DIAGNOSIS — A312 Disseminated mycobacterium avium-intracellulare complex (DMAC): Secondary | ICD-10-CM | POA: Diagnosis not present

## 2018-01-04 DIAGNOSIS — I272 Pulmonary hypertension, unspecified: Secondary | ICD-10-CM | POA: Diagnosis not present

## 2018-01-04 DIAGNOSIS — J9621 Acute and chronic respiratory failure with hypoxia: Secondary | ICD-10-CM | POA: Diagnosis not present

## 2018-01-04 DIAGNOSIS — J449 Chronic obstructive pulmonary disease, unspecified: Secondary | ICD-10-CM | POA: Diagnosis not present

## 2018-01-05 DIAGNOSIS — R05 Cough: Secondary | ICD-10-CM | POA: Diagnosis not present

## 2018-01-05 DIAGNOSIS — L501 Idiopathic urticaria: Secondary | ICD-10-CM | POA: Diagnosis not present

## 2018-01-06 DIAGNOSIS — E039 Hypothyroidism, unspecified: Secondary | ICD-10-CM | POA: Diagnosis not present

## 2018-01-06 DIAGNOSIS — J479 Bronchiectasis, uncomplicated: Secondary | ICD-10-CM | POA: Diagnosis not present

## 2018-01-06 DIAGNOSIS — I4891 Unspecified atrial fibrillation: Secondary | ICD-10-CM | POA: Diagnosis not present

## 2018-01-06 DIAGNOSIS — J449 Chronic obstructive pulmonary disease, unspecified: Secondary | ICD-10-CM | POA: Diagnosis not present

## 2018-01-06 DIAGNOSIS — Z9981 Dependence on supplemental oxygen: Secondary | ICD-10-CM | POA: Diagnosis not present

## 2018-01-06 DIAGNOSIS — A312 Disseminated mycobacterium avium-intracellulare complex (DMAC): Secondary | ICD-10-CM | POA: Diagnosis not present

## 2018-01-06 DIAGNOSIS — Z681 Body mass index (BMI) 19 or less, adult: Secondary | ICD-10-CM | POA: Diagnosis not present

## 2018-01-06 DIAGNOSIS — I272 Pulmonary hypertension, unspecified: Secondary | ICD-10-CM | POA: Diagnosis not present

## 2018-01-06 DIAGNOSIS — R634 Abnormal weight loss: Secondary | ICD-10-CM | POA: Diagnosis not present

## 2018-01-06 DIAGNOSIS — J9621 Acute and chronic respiratory failure with hypoxia: Secondary | ICD-10-CM | POA: Diagnosis not present

## 2018-01-11 ENCOUNTER — Ambulatory Visit (INDEPENDENT_AMBULATORY_CARE_PROVIDER_SITE_OTHER): Admitting: Internal Medicine

## 2018-01-11 ENCOUNTER — Encounter: Payer: Self-pay | Admitting: Internal Medicine

## 2018-01-11 DIAGNOSIS — I272 Pulmonary hypertension, unspecified: Secondary | ICD-10-CM | POA: Diagnosis not present

## 2018-01-11 DIAGNOSIS — L509 Urticaria, unspecified: Secondary | ICD-10-CM | POA: Diagnosis not present

## 2018-01-11 DIAGNOSIS — J449 Chronic obstructive pulmonary disease, unspecified: Secondary | ICD-10-CM | POA: Diagnosis not present

## 2018-01-11 DIAGNOSIS — J9621 Acute and chronic respiratory failure with hypoxia: Secondary | ICD-10-CM | POA: Diagnosis not present

## 2018-01-11 DIAGNOSIS — I4891 Unspecified atrial fibrillation: Secondary | ICD-10-CM | POA: Diagnosis not present

## 2018-01-11 DIAGNOSIS — J9611 Chronic respiratory failure with hypoxia: Secondary | ICD-10-CM

## 2018-01-11 DIAGNOSIS — J479 Bronchiectasis, uncomplicated: Secondary | ICD-10-CM | POA: Diagnosis not present

## 2018-01-11 DIAGNOSIS — A312 Disseminated mycobacterium avium-intracellulare complex (DMAC): Secondary | ICD-10-CM | POA: Diagnosis not present

## 2018-01-11 NOTE — Patient Instructions (Addendum)
The treatment for "chronic urticaria" is as follows:   Fexofenadine is generic for allegra.  Start with 180 mg dose daily ,  Increase your dose to 2 daily after a few days  ,  And continue to increase every few days if needed for itching until you are taking   4  Tablets  daily  (all at once)   Also Start famotidine (generic for Pepcid) or ranitidine (generic for zantac) 150 mg twice daily; these are H2 blockers  So they also block histamine release in a different way to help with itching   You need to start using a good moisturizer like Eucerin,  Cetaphil   Or Aveeno  Once daily after you shower    Reduce the gabapentin to once daily for 4 days,  Then stop taking it

## 2018-01-11 NOTE — Progress Notes (Signed)
Subjective:  Patient ID: Hannah Kline, female    DOB: 06/02/1932  Age: 84 y.o. MRN: [id number]  CC: Diagnoses of Urticarial dermatitis and Chronic respiratory failure with hypoxia ([place]) were pertinent to this visit.  HPI Hannah Kline presents for follow up on urticaria and chronic respiratory failure. Marland Kitchen  She was Seen one month ago  For persistent symptoms or urticaria and was advised to increase her  dose of allegra gradually to a maximal dose of 180 mg 4 times daily  And coninue using H2 blocker.  She did not try the medication changes but instead Gabapentin dose was also increased by request of Hospice,  She notes no significant improvement.  The only regimen that helps temporarily is scrubbing her body with a washcloth. Not using a moisturier wither.   She is more morose today but ties to interject humor. Making observations that reflect she is looking for signs that she is approaching death.  Still receiving guests,  No bucket list,  Everything makes her short of breath.  Wearing 02 continually    Outpatient Medications Prior to Visit  Medication Sig Dispense Refill  . albuterol (PROVENTIL HFA;VENTOLIN HFA) 108 (90 Base) MCG/ACT inhaler Inhale 2 puffs into the lungs every 4 (four) hours as needed for wheezing or shortness of breath. 1 Inhaler 2  . arformoterol (BROVANA) 15 MCG/2ML NEBU Take 2 mLs (15 mcg total) by nebulization 2 (two) times daily. 120 mL 11  . aspirin EC 81 MG tablet Take 81 mg by mouth daily.    . budesonide (PULMICORT) 0.5 MG/2ML nebulizer solution Take 2 mLs (0.5 mg total) by nebulization 2 (two) times daily. 120 mL 11  . diltiazem (CARDIZEM) 30 MG tablet Take 1 tablet (30 mg total) by mouth 3 (three) times daily as needed (atrial fibrillation). 270 tablet 3  . dorzolamide (TRUSOPT) 2 % ophthalmic solution Place 1 drop into the left eye 2 (two) times daily.  7  . gabapentin (NEURONTIN) 100 MG capsule Take 1 capsule (100 mg total) by mouth 3 (three) times daily. 90 capsule  3  . levothyroxine (SYNTHROID, LEVOTHROID) 75 MCG tablet Take 1 tablet (75 mcg total) by mouth daily. 90 tablet 0  . OXYGEN Inhale 2 L into the lungs. [place] - when exercising, walking around, and at night    . propafenone (RYTHMOL) 150 MG tablet Take 1 tablet (150 mg total) by mouth 2 (two) times daily. 60 tablet 6  . Respiratory Therapy Supplies (FLUTTER) DEVI Use as directed 1 each 0  . apixaban (ELIQUIS) 2.5 MG TABS tablet Take 1 tablet (2.5 mg total) by mouth 2 (two) times daily. (Patient not taking: Reported on 12/12/2017) 180 tablet 3  . famotidine (PEPCID) 20 MG tablet Take 1 tablet (20 mg total) by mouth 2 (two) times daily. (Patient not taking: Reported on 01/11/2018) 180 tablet 2  . fexofenadine (ALLEGRA) 180 MG tablet Take 1 tablet (180 mg total) by mouth 4 (four) times daily. Gradual increase (Patient not taking: Reported on 01/11/2018) 120 tablet 2  . montelukast (SINGULAIR) 10 MG tablet Take 1 tablet (10 mg total) by mouth at bedtime. (Patient not taking: Reported on 12/12/2017) 30 tablet 3  . predniSONE (DELTASONE) 20 MG tablet Take 1 tablet (20 mg total) by mouth daily with breakfast. As needed for hives (Patient not taking: Reported on 01/11/2018) 5 tablet 02   No facility-administered medications prior to visit.     Review of Systems;  Patient denies headache, fevers, malaise, unintentional weight loss, skin  rash, eye pain, sinus congestion and sinus pain, sore throat, dysphagia,  hemoptysis , cough, dyspnea, wheezing, chest pain, palpitations, orthopnea, edema, abdominal pain, nausea, melena, diarrhea, constipation, flank pain, dysuria, hematuria, urinary  Frequency, nocturia, numbness, tingling, seizures,  Focal weakness, Loss of consciousness,  Tremor, insomnia, depression, anxiety, and suicidal ideation.      Objective:  BP 130/78 (BP Location: Left Arm, Patient Position: Sitting, Cuff Size: Normal)   Pulse 71   Temp 97.6 F (36.4 C) (Oral)   Resp 15   Ht 5\' 8"  (1.727 m)   Wt 103  lb 6.4 oz (46.9 kg)   SpO2 93%   BMI 15.72 kg/m   BP Readings from Last 3 Encounters:  01/11/18 130/78  12/12/17 116/62  10/13/17 112/66    Wt Readings from Last 3 Encounters:  01/11/18 103 lb 6.4 oz (46.9 kg)  12/12/17 104 lb 6.4 oz (47.4 kg)  10/13/17 107 lb (48.5 kg)    General appearance: alert, cooperative and appears stated age Ears: normal TM's and external ear canals both ears Throat: lips, mucosa, and tongue normal; teeth and gums normal Neck: no adenopathy, no carotid bruit, supple, symmetrical, trachea midline and thyroid not enlarged, symmetric, no tenderness/mass/nodules Back: symmetric, no curvature. ROM normal. No CVA tenderness. Lungs: clear to auscultation bilaterally Heart: regular rate and rhythm, S1, S2 normal, no murmur, click, rub or gallop Abdomen: soft, non-tender; bowel sounds normal; no masses,  no organomegaly Pulses: 2+ and symmetric Skin: Skin color, texture, turgor normal. No rashes or lesions Lymph nodes: Cervical, supraclavicular, and axillary nodes normal.  Lab Results  Component Value Date   HGBA1C 5.8 (H) 11/22/2014    Lab Results  Component Value Date   CREATININE 0.77 10/01/2017   CREATININE 0.79 08/30/2017   CREATININE 0.82 07/27/2017    Lab Results  Component Value Date   WBC 19.6 (H) 10/01/2017   HGB 13.9 10/01/2017   HCT 41.2 10/01/2017   PLT 342 10/01/2017   GLUCOSE 79 10/01/2017   CHOL 177 01/22/2016   TRIG 78.0 01/22/2016   HDL 53.50 01/22/2016   LDLCALC 108 (H) 01/22/2016   ALT 14 10/01/2017   AST 28 10/01/2017   NA 132 (L) 10/01/2017   K 4.1 10/01/2017   CL 94 (L) 10/01/2017   CREATININE 0.77 10/01/2017   BUN 12 10/01/2017   CO2 29 10/01/2017   TSH 3.55 07/27/2017   INR 1.22 06/14/2016   HGBA1C 5.8 (H) 11/22/2014    Dg Chest 2 View  Result Date: 10/01/2017 CLINICAL DATA:  Shortness of breath. EXAM: CHEST  2 VIEW COMPARISON:  Radiographs of August 30, 2017. FINDINGS: The heart size and mediastinal  contours are within normal limits. No pneumothorax or pleural effusion is noted. Stable reticular and nodular densities are noted throughout both lungs consistent with prior mycobacterium infection. No new opacities are noted. The visualized skeletal structures are unremarkable. IMPRESSION: Stable bilateral lung opacities are noted most consistent with history of prior mycobacterium infection. No significant changes noted compared to prior exam. Electronically Signed   By: Marijo Conception, M.D.   On: 10/01/2017 14:53    Assessment & Plan:   Problem List Items Addressed This Visit    Respiratory failure, chronic ([place])    Secondary to bronchiectasis from MAI.  On chronic 02,  On Hospice.  A total of 25 minutes was spent with patient more than half of which was spent in discussion with patient on the above mentioned issues , and coordination of care.  Urticarial dermatitis    Encouraged to try the regimen for chronic urticaria with allegra up to 4 times th maximal dose, tiwce daily PPI and singulair. moistturizer also needed          I am having [name] maintain her OXYGEN, diltiazem, budesonide, arformoterol, dorzolamide, levothyroxine, propafenone, FLUTTER, apixaban, albuterol, montelukast, predniSONE, famotidine, fexofenadine, gabapentin, and aspirin EC.  No orders of the defined types were placed in this encounter.   There are no discontinued medications.  Follow-up: Return for followup urticaria .   Crecencio Mc, MD

## 2018-01-12 DIAGNOSIS — I4891 Unspecified atrial fibrillation: Secondary | ICD-10-CM | POA: Diagnosis not present

## 2018-01-12 DIAGNOSIS — J479 Bronchiectasis, uncomplicated: Secondary | ICD-10-CM | POA: Diagnosis not present

## 2018-01-12 DIAGNOSIS — I272 Pulmonary hypertension, unspecified: Secondary | ICD-10-CM | POA: Diagnosis not present

## 2018-01-12 DIAGNOSIS — J449 Chronic obstructive pulmonary disease, unspecified: Secondary | ICD-10-CM | POA: Diagnosis not present

## 2018-01-12 DIAGNOSIS — A312 Disseminated mycobacterium avium-intracellulare complex (DMAC): Secondary | ICD-10-CM | POA: Diagnosis not present

## 2018-01-12 DIAGNOSIS — J9621 Acute and chronic respiratory failure with hypoxia: Secondary | ICD-10-CM | POA: Diagnosis not present

## 2018-01-14 NOTE — Assessment & Plan Note (Signed)
Encouraged to try the regimen for chronic urticaria with allegra up to 4 times th maximal dose, tiwce daily PPI and singulair. moistturizer also needed

## 2018-01-14 NOTE — Assessment & Plan Note (Signed)
Secondary to bronchiectasis from MAI.  On chronic 02,  On Hospice.  A total of 25 minutes was spent with patient more than half of which was spent in discussion with patient on the above mentioned issues , and coordination of care.

## 2018-01-18 DIAGNOSIS — I4891 Unspecified atrial fibrillation: Secondary | ICD-10-CM | POA: Diagnosis not present

## 2018-01-18 DIAGNOSIS — A312 Disseminated mycobacterium avium-intracellulare complex (DMAC): Secondary | ICD-10-CM | POA: Diagnosis not present

## 2018-01-18 DIAGNOSIS — J449 Chronic obstructive pulmonary disease, unspecified: Secondary | ICD-10-CM | POA: Diagnosis not present

## 2018-01-18 DIAGNOSIS — J479 Bronchiectasis, uncomplicated: Secondary | ICD-10-CM | POA: Diagnosis not present

## 2018-01-18 DIAGNOSIS — J9621 Acute and chronic respiratory failure with hypoxia: Secondary | ICD-10-CM | POA: Diagnosis not present

## 2018-01-18 DIAGNOSIS — I272 Pulmonary hypertension, unspecified: Secondary | ICD-10-CM | POA: Diagnosis not present

## 2018-01-20 ENCOUNTER — Ambulatory Visit (INDEPENDENT_AMBULATORY_CARE_PROVIDER_SITE_OTHER): Payer: No Typology Code available for payment source | Admitting: Internal Medicine

## 2018-01-20 ENCOUNTER — Encounter: Payer: Self-pay | Admitting: Internal Medicine

## 2018-01-20 VITALS — BP 120/58 | HR 72 | Temp 97.5°F | Resp 16 | Ht 68.0 in | Wt 100.0 lb

## 2018-01-20 DIAGNOSIS — L509 Urticaria, unspecified: Secondary | ICD-10-CM | POA: Diagnosis not present

## 2018-01-20 DIAGNOSIS — J209 Acute bronchitis, unspecified: Secondary | ICD-10-CM | POA: Diagnosis not present

## 2018-01-20 DIAGNOSIS — J440 Chronic obstructive pulmonary disease with acute lower respiratory infection: Secondary | ICD-10-CM

## 2018-01-20 DIAGNOSIS — I4891 Unspecified atrial fibrillation: Secondary | ICD-10-CM | POA: Diagnosis not present

## 2018-01-20 DIAGNOSIS — J479 Bronchiectasis, uncomplicated: Secondary | ICD-10-CM | POA: Diagnosis not present

## 2018-01-20 DIAGNOSIS — R42 Dizziness and giddiness: Secondary | ICD-10-CM

## 2018-01-20 DIAGNOSIS — I272 Pulmonary hypertension, unspecified: Secondary | ICD-10-CM | POA: Diagnosis not present

## 2018-01-20 DIAGNOSIS — R110 Nausea: Secondary | ICD-10-CM

## 2018-01-20 DIAGNOSIS — J9621 Acute and chronic respiratory failure with hypoxia: Secondary | ICD-10-CM | POA: Diagnosis not present

## 2018-01-20 DIAGNOSIS — A312 Disseminated mycobacterium avium-intracellulare complex (DMAC): Secondary | ICD-10-CM | POA: Diagnosis not present

## 2018-01-20 DIAGNOSIS — J449 Chronic obstructive pulmonary disease, unspecified: Secondary | ICD-10-CM | POA: Diagnosis not present

## 2018-01-20 LAB — CBC WITH DIFFERENTIAL/PLATELET
BASOS PCT: 0.8 % (ref 0.0–3.0)
Basophils Absolute: 0.1 10*3/uL (ref 0.0–0.1)
EOS PCT: 5.5 % — AB (ref 0.0–5.0)
Eosinophils Absolute: 0.7 10*3/uL (ref 0.0–0.7)
HCT: 38.7 % (ref 36.0–46.0)
Hemoglobin: 12.7 g/dL (ref 12.0–15.0)
LYMPHS ABS: 1.1 10*3/uL (ref 0.7–4.0)
Lymphocytes Relative: 8 % — ABNORMAL LOW (ref 12.0–46.0)
MCHC: 32.9 g/dL (ref 30.0–36.0)
MCV: 95.2 fl (ref 78.0–100.0)
Monocytes Absolute: 1.3 10*3/uL — ABNORMAL HIGH (ref 0.1–1.0)
Monocytes Relative: 9.4 % (ref 3.0–12.0)
NEUTROS PCT: 76.3 % (ref 43.0–77.0)
Neutro Abs: 10.3 10*3/uL — ABNORMAL HIGH (ref 1.4–7.7)
Platelets: 278 10*3/uL (ref 150.0–400.0)
RBC: 4.06 Mil/uL (ref 3.87–5.11)
RDW: 14.7 % (ref 11.5–15.5)
WBC: 13.5 10*3/uL — ABNORMAL HIGH (ref 4.0–10.5)

## 2018-01-20 LAB — COMPREHENSIVE METABOLIC PANEL
ALK PHOS: 85 U/L (ref 39–117)
ALT: 14 U/L (ref 0–35)
AST: 16 U/L (ref 0–37)
Albumin: 3.5 g/dL (ref 3.5–5.2)
BILIRUBIN TOTAL: 0.4 mg/dL (ref 0.2–1.2)
BUN: 14 mg/dL (ref 6–23)
CO2: 36 meq/L — AB (ref 19–32)
Calcium: 9.1 mg/dL (ref 8.4–10.5)
Chloride: 98 mEq/L (ref 96–112)
Creatinine, Ser: 0.79 mg/dL (ref 0.40–1.20)
GFR: 73.77 mL/min (ref >60.00)
GLUCOSE: 108 mg/dL — AB (ref 70–99)
POTASSIUM: 4.1 meq/L (ref 3.5–5.1)
SODIUM: 138 meq/L (ref 135–145)
TOTAL PROTEIN: 7.8 g/dL (ref 6.0–8.3)

## 2018-01-20 MED ORDER — PREDNISONE 10 MG PO TABS
ORAL_TABLET | ORAL | 0 refills | Status: DC
Start: 2018-01-20 — End: 2018-02-13

## 2018-01-20 NOTE — Progress Notes (Signed)
Subjective:  Patient ID: Hannah Kline, female    DOB: 11-22-1931  Age: 84 y.o. MRN: [id number]  CC: The primary encounter diagnosis was Nausea. Diagnoses of Light headed, Acute bronchitis with chronic obstructive pulmonary disease (COPD) ([place]), and Urticarial dermatitis were also pertinent to this visit.  HPI Hannah Kline presents for follow up on urticarial dermatitis. Last seen one week ago,  Had not tried the regimen advised in February and symptoms had not changed. Trial of gabapentin had not helped either , so last week she was advised to reduce the gabapentin to once daily x 4, then stop and advised to start pepcid and allegra    Today she reports that with 360 mg of allegra daily,  Her itching has improved significantly . She is sleepgn beter,  She is not using the new moisturizer yet.   Her respiratory issus are unchanged  She is using 2 L 02 with quiet sedentary activities and 3L with movement   Outpatient Medications Prior to Visit  Medication Sig Dispense Refill  . albuterol (PROVENTIL HFA;VENTOLIN HFA) 108 (90 Base) MCG/ACT inhaler Inhale 2 puffs into the lungs every 4 (four) hours as needed for wheezing or shortness of breath. 1 Inhaler 2  . arformoterol (BROVANA) 15 MCG/2ML NEBU Take 2 mLs (15 mcg total) by nebulization 2 (two) times daily. 120 mL 11  . aspirin EC 81 MG tablet Take 81 mg by mouth daily.    . budesonide (PULMICORT) 0.5 MG/2ML nebulizer solution Take 2 mLs (0.5 mg total) by nebulization 2 (two) times daily. 120 mL 11  . [facility] SENNA PLUS 8.6-50 MG tablet TAKE 1-2 TABS BY MOUTH TWICE DAILY  3  . diltiazem (CARDIZEM) 30 MG tablet Take 1 tablet (30 mg total) by mouth 3 (three) times daily as needed (atrial fibrillation). 270 tablet 3  . dorzolamide (TRUSOPT) 2 % ophthalmic solution Place 1 drop into the left eye 2 (two) times daily.  7  . famotidine (PEPCID) 20 MG tablet Take 1 tablet (20 mg total) by mouth 2 (two) times daily. 180 tablet 2  . fexofenadine (ALLEGRA) 180  MG tablet Take 1 tablet (180 mg total) by mouth 4 (four) times daily. Gradual increase 120 tablet 2  . gabapentin (NEURONTIN) 100 MG capsule Take 1 capsule (100 mg total) by mouth 3 (three) times daily. 90 capsule 3  . levothyroxine (SYNTHROID, LEVOTHROID) 75 MCG tablet Take 1 tablet (75 mcg total) by mouth daily. 90 tablet 0  . montelukast (SINGULAIR) 10 MG tablet Take 1 tablet (10 mg total) by mouth at bedtime. 30 tablet 3  . OXYGEN Inhale 2 L into the lungs. [place] - when exercising, walking around, and at night    . propafenone (RYTHMOL) 150 MG tablet Take 1 tablet (150 mg total) by mouth 2 (two) times daily. 60 tablet 6  . Respiratory Therapy Supplies (FLUTTER) DEVI Use as directed 1 each 0  . predniSONE (DELTASONE) 20 MG tablet Take 1 tablet (20 mg total) by mouth daily with breakfast. As needed for hives 5 tablet 02  . apixaban (ELIQUIS) 2.5 MG TABS tablet Take 1 tablet (2.5 mg total) by mouth 2 (two) times daily. (Patient not taking: Reported on 12/12/2017) 180 tablet 3   No facility-administered medications prior to visit.     Review of Systems;  Patient denies headache, fevers, malaise, unintentional weight loss, skin rash, eye pain, sinus congestion and sinus pain, sore throat, dysphagia,  hemoptysis , cough, dyspnea, wheezing, chest pain, palpitations, orthopnea, edema, abdominal  pain, nausea, melena, diarrhea, constipation, flank pain, dysuria, hematuria, urinary  Frequency, nocturia, numbness, tingling, seizures,  Focal weakness, Loss of consciousness,  Tremor, insomnia, depression, anxiety, and suicidal ideation.      Objective:  BP (!) 120/58 (BP Location: Left Arm, Patient Position: Sitting, Cuff Size: Normal)   Pulse 72   Temp (!) 97.5 F (36.4 C) (Oral)   Resp 16   Ht 5\' 8"  (1.727 m)   Wt 100 lb (45.4 kg)   SpO2 96%   BMI 15.20 kg/m   BP Readings from Last 3 Encounters:  01/20/18 (!) 120/58  01/11/18 130/78  12/12/17 116/62    Wt Readings from Last 3 Encounters:    01/20/18 100 lb (45.4 kg)  01/11/18 103 lb 6.4 oz (46.9 kg)  12/12/17 104 lb 6.4 oz (47.4 kg)    General appearance: alert, cooperative and appears stated age Ears: normal TM's and external ear canals both ears Throat: lips, mucosa, and tongue normal; teeth and gums normal Neck: no adenopathy, no carotid bruit, supple, symmetrical, trachea midline and thyroid not enlarged, symmetric, no tenderness/mass/nodules Back: symmetric, no curvature. ROM normal. No CVA tenderness. Lungs: decreased BS bilaterally with end inspiratory squeaks and bilateral wheezing  Heart: regular rate and rhythm, S1, S2 normal, no murmur, click, rub or gallop Abdomen: soft, non-tender; bowel sounds normal; no masses,  no organomegaly Pulses: 2+ and symmetric Skin: Skin color, texture, turgor normal. No rashes or lesions Lymph nodes: Cervical, supraclavicular, and axillary nodes normal.  Lab Results  Component Value Date   HGBA1C 5.8 (H) 11/22/2014    Lab Results  Component Value Date   CREATININE 0.79 01/20/2018   CREATININE 0.77 10/01/2017   CREATININE 0.79 08/30/2017    Lab Results  Component Value Date   WBC 13.5 (H) 01/20/2018   HGB 12.7 01/20/2018   HCT 38.7 01/20/2018   PLT 278.0 01/20/2018   GLUCOSE 108 (H) 01/20/2018   CHOL 177 01/22/2016   TRIG 78.0 01/22/2016   HDL 53.50 01/22/2016   LDLCALC 108 (H) 01/22/2016   ALT 14 01/20/2018   AST 16 01/20/2018   NA 138 01/20/2018   K 4.1 01/20/2018   CL 98 01/20/2018   CREATININE 0.79 01/20/2018   BUN 14 01/20/2018   CO2 36 (H) 01/20/2018   TSH 3.55 07/27/2017   INR 1.22 06/14/2016   HGBA1C 5.8 (H) 11/22/2014     Assessment & Plan:   Problem List Items Addressed This Visit    Acute bronchitis with chronic obstructive pulmonary disease (COPD) ([place])    Starting a prednisone taper for management of mild wheezing which was noted  2 days ago by patient       Relevant Medications   predniSONE (DELTASONE) 10 MG tablet   Urticarial  dermatitis    Improved with increased dose of allegra (360 mg daily currently) and twice daily H2 blocker .  Reminded to moisturize skin as well        Other Visit Diagnoses    Nausea    -  Primary   Relevant Orders   Comprehensive metabolic panel (Completed)   Light headed       Relevant Orders   CBC with Differential/Platelet (Completed)     A total of 25 minutes of face to face time was spent with patient more than half of which was spent in counselling about the above mentioned conditions  and coordination of care  I have discontinued Melannie Bebee's apixaban and predniSONE. I am also having her  start on predniSONE. Additionally, I am having her maintain her OXYGEN, diltiazem, budesonide, arformoterol, dorzolamide, levothyroxine, propafenone, FLUTTER, albuterol, montelukast, famotidine, fexofenadine, gabapentin, aspirin EC, and [facility] SENNA PLUS.  Meds ordered this encounter  Medications  . predniSONE (DELTASONE) 10 MG tablet    Sig: 6 tablets on Day 1 , then reduce by 1 tablet daily until gone    Dispense:  21 tablet    Refill:  0    Medications Discontinued During This Encounter  Medication Reason  . apixaban (ELIQUIS) 2.5 MG TABS tablet Patient has not taken in last 30 days  . predniSONE (DELTASONE) 20 MG tablet     Follow-up: Return if symptoms worsen or fail to improve.   Crecencio Mc, MD

## 2018-01-20 NOTE — Patient Instructions (Signed)
I'm glad the allegra /famotidine combination is working!!!  Continue both the allegra and the famotidine  Indefinitely  , on your current schedules  You can increase the allegra to 3 tablets daily if you need to for the itching   Start a  prednisone taper for the wheezing:   .60 mg  all at once on Day 1,  Then 50 mg  on Day 2,  Continue to decrease by 10 mg  daily until gone

## 2018-01-22 NOTE — Assessment & Plan Note (Signed)
Starting a prednisone taper for management of mild wheezing which was noted  2 days ago by patient

## 2018-01-22 NOTE — Assessment & Plan Note (Signed)
Improved with increased dose of allegra (360 mg daily currently) and twice daily H2 blocker .  Reminded to moisturize skin as well

## 2018-01-25 DIAGNOSIS — J449 Chronic obstructive pulmonary disease, unspecified: Secondary | ICD-10-CM | POA: Diagnosis not present

## 2018-01-25 DIAGNOSIS — I4891 Unspecified atrial fibrillation: Secondary | ICD-10-CM | POA: Diagnosis not present

## 2018-01-25 DIAGNOSIS — I272 Pulmonary hypertension, unspecified: Secondary | ICD-10-CM | POA: Diagnosis not present

## 2018-01-25 DIAGNOSIS — A312 Disseminated mycobacterium avium-intracellulare complex (DMAC): Secondary | ICD-10-CM | POA: Diagnosis not present

## 2018-01-25 DIAGNOSIS — J9621 Acute and chronic respiratory failure with hypoxia: Secondary | ICD-10-CM | POA: Diagnosis not present

## 2018-01-25 DIAGNOSIS — J479 Bronchiectasis, uncomplicated: Secondary | ICD-10-CM | POA: Diagnosis not present

## 2018-01-31 ENCOUNTER — Ambulatory Visit (INDEPENDENT_AMBULATORY_CARE_PROVIDER_SITE_OTHER): Payer: Medicare Other

## 2018-01-31 VITALS — BP 110/70 | HR 72 | Temp 97.8°F | Resp 16 | Ht 68.0 in | Wt 103.1 lb

## 2018-01-31 DIAGNOSIS — Z0000 Encounter for general adult medical examination without abnormal findings: Secondary | ICD-10-CM | POA: Diagnosis not present

## 2018-01-31 NOTE — Progress Notes (Signed)
Subjective:   Hannah Kline is a 84 y.o. female who presents for Medicare Annual (Subsequent) preventive examination.  Review of Systems:  No ROS.  Medicare Wellness Visit. Additional risk factors are reflected in the social history.  Cardiac Risk Factors include: advanced age (>56mn, >>17women)     Objective:     Vitals: BP 110/70 (BP Location: Left Arm, Patient Position: Sitting, Cuff Size: Normal)   Pulse 72   Temp 97.8 F (36.6 C) (Oral)   Resp 16   Ht _0  (1.727 m)   Wt 103 lb 1.9 oz (46.8 kg)   SpO2 93%   BMI 15.68 kg/m   Body mass index is 15.68 kg/m.  Advanced Directives 01/31/2018 10/01/2017 04/24/2017 04/24/2017 01/28/2017 06/14/2016 01/26/2016  Does Patient Have a Medical Advance Directive? Yes No _1   Type of Advance Directive Living will;Healthcare [place] of [place]  Does patient want to make changes to medical advance directive? No - Patient declined - No - Patient declined - No - Patient declined - No - Patient declined  Copy of [place] Chart? No - copy requested - No - copy requested - No - copy requested - No - copy requested  Would patient like information on creating a medical advance directive? - No - Patient declined - - - No - patient declined information -    Tobacco Social History   Tobacco Use  Smoking Status Never Smoker  Smokeless Tobacco Never Used     Counseling given: Not Answered   Clinical Intake:  Pre-visit preparation completed: Yes  Pain : No/denies pain     Nutritional Status: BMI <19  Underweight Diabetes: No  How often do you need to have someone help you when you read instructions, pamphlets, or other written materials from your doctor or pharmacy?: 1 - Never  Interpreter Needed?: No     Past Medical History:  Diagnosis Date  . Atrial fibrillation ([place]   . Bacterial  infection due to Pseudomonas   . Bronchiectasis   . COPD (chronic obstructive pulmonary disease) ([place]    . Cough    from bronchiectasis neb txs  . Dysrhythmia    A Fib  . Hepatitis    h/o INH(isoniazide)  . History of Mycobacterium avium complex infection    lung disease  . Hypothyroidism   . Pleurisy 12/08/11  . Shortness of breath dyspnea    Past Surgical History:  Procedure Laterality Date  . CATARACT EXTRACTION W/PHACO Left 01/26/2016   Procedure: CATARACT EXTRACTION PHACO AND INTRAOCULAR LENS PLACEMENT (IOC);  Surgeon: ARonnell Freshwater MD;  [facility]  Service: Ophthalmology;  Laterality: Left;  . COLONOSCOPY    . SHOULDER ARTHROSCOPY     right   Family History  Problem Relation Age of Onset  . Angina Mother   . Other Mother        intestional blockage  . Diabetes Maternal Grandfather    Social History   Socioeconomic History  . Marital status: Married    Spouse name: Not on file  . Number of children: 5  . Years of education: Not on file  . Highest education level: Not on file  Occupational History  . Occupation: RChiropractor retired  SScientific laboratory technician . Financial resource strain: Not hard at all  . Food insecurity:  Worry: Never true    Inability: Never true  . Transportation needs:    Medical: No    Non-medical: No  Tobacco Use  . Smoking status: Never Smoker  . Smokeless tobacco: Never Used  Substance and Sexual Activity  . Alcohol use: Yes    Alcohol/week: 4.2 - 4.8 oz    Types: 7 Glasses of wine per week  . Drug use: No  . Sexual activity: Not Currently  Lifestyle  . Physical activity:    Days per week: Not on file    Minutes per session: Not on file  . Stress: Not on file  Relationships  . Social connections:    Talks on phone: Not on file    Gets together: Not on file    Attends religious service: Not on file    Active member of club or organization: Not on file    Attends meetings of  clubs or organizations: Not on file    Relationship status: Not on file  Other Topics Concern  . Not on file  Social History Narrative  . Not on file    Outpatient Encounter Medications as of 01/31/2018  Medication Sig  . albuterol (PROVENTIL HFA;VENTOLIN HFA) 108 (90 Base) MCG/ACT inhaler Inhale 2 puffs into the lungs every 4 (four) hours as needed for wheezing or shortness of breath.  Marland Kitchen arformoterol (BROVANA) 15 MCG/2ML NEBU Take 2 mLs (15 mcg total) by nebulization 2 (two) times daily.  Marland Kitchen aspirin EC 81 MG tablet Take 81 mg by mouth daily.  . budesonide (PULMICORT) 0.5 MG/2ML nebulizer solution Take 2 mLs (0.5 mg total) by nebulization 2 (two) times daily.  . [facility] SENNA PLUS 8.6-50 MG tablet TAKE 1-2 TABS BY MOUTH TWICE DAILY  . diltiazem (CARDIZEM) 30 MG tablet Take 1 tablet (30 mg total) by mouth 3 (three) times daily as needed (atrial fibrillation).  . dorzolamide (TRUSOPT) 2 % ophthalmic solution Place 1 drop into the left eye 2 (two) times daily.  . famotidine (PEPCID) 20 MG tablet Take 1 tablet (20 mg total) by mouth 2 (two) times daily.  . fexofenadine (ALLEGRA) 180 MG tablet Take 1 tablet (180 mg total) by mouth 4 (four) times daily. Gradual increase  . gabapentin (NEURONTIN) 100 MG capsule Take 1 capsule (100 mg total) by mouth 3 (three) times daily.  Marland Kitchen levothyroxine (SYNTHROID, LEVOTHROID) 75 MCG tablet Take 1 tablet (75 mcg total) by mouth daily.  . OXYGEN Inhale 2 L into the lungs. [place] - when exercising, walking around, and at night  . propafenone (RYTHMOL) 150 MG tablet Take 1 tablet (150 mg total) by mouth 2 (two) times daily.  Marland Kitchen Respiratory Therapy Supplies (FLUTTER) DEVI Use as directed  . [DISCONTINUED] montelukast (SINGULAIR) 10 MG tablet Take 1 tablet (10 mg total) by mouth at bedtime.  . predniSONE (DELTASONE) 10 MG tablet 6 tablets on Day 1 , then reduce by 1 tablet daily until gone (Patient not taking: Reported on 01/31/2018)   No facility-administered encounter  medications on file as of 01/31/2018.     Activities of Daily Living In your present state of health, do you have any difficulty performing the following activities: 01/31/2018 04/24/2017  Hearing? N N  Vision? Y N  Comment L eye  -  Difficulty concentrating or making decisions? N N  Walking or climbing stairs? Y N  Comment Unsteady gait -  Dressing or bathing? Y N  Comment Husband assists -  Doing errands, shopping? N N  Conservation officer, nature and  eating ? N -  Using the Toilet? N -  In the past six months, have you accidently leaked urine? N -  Do you have problems with loss of bowel control? N -  Managing your Medications? N -  Managing your Finances? N -  Housekeeping or managing your Housekeeping? Y -  Some recent data might be hidden    Patient Care Team: Crecencio Mc, MD as PCP - General (Internal Medicine) Minna Merritts, MD as Consulting Physician (Cardiology)    Assessment:   This is a routine wellness examination for [place].  The goal of the wellness visit is to assist the patient how to close the gaps in care and create a preventative care plan for the patient.   The roster of all physicians providing medical care to patient is listed in the Snapshot section of the chart.  Osteoporosis reviewed.    Safety issues reviewed; Smoke and carbon monoxide detectors in the home. No firearms  a safe within the home. Wears seatbelts when driving or riding with others. No violence in the home.  They do not have excessive sun exposure.  Discussed the need for sun protection: hats, long sleeves and the use of sunscreen if there is significant sun exposure.   Patient is alert, normal appearance, oriented to person/place/and time.  Correctly identified the president of the [place] and recalls of 3/3 words. Performs simple calculations and can read correct time from watch face. Displays appropriate judgement.  No new identified risk were noted.  No failures at ADL's or IADL's.   Ambulates with cane.   BMI- discussed the importance of a healthy diet, water intake and the benefits of aerobic exercise. Educational material provided.   24 hour diet recall: Regular diet, small meals.    Dental- every 6 months.  Eye- Visual acuity not assessed per patient preference since they have regular follow up with the ophthalmologist.  Wears corrective lenses.  Sleep patterns- Sleeps 8 hours at night.  Wakes feeling rested. Oxygen set at 2L in use.  Naps during the day  Health maintenance gaps- closed.  Patient Concerns: None at this time. Follow up with PCP as needed.  Exercise Activities and Dietary recommendations Current Exercise Habits: The patient does not participate in regular exercise at present  Goals    None      Fall Risk Fall Risk  01/31/2018 01/28/2017 01/14/2016 01/07/2014  Falls in the past year? No No No No   Depression Screen PHQ 2/9 Scores 01/31/2018 01/28/2017 01/14/2016 01/07/2014  PHQ - 2 Score 0 0 0 0     Cognitive Function MMSE - Mini Mental State Exam 01/28/2017  Orientation to time 5  Orientation to Place 5  Registration 3  Attention/ Calculation 5  Recall 3  Language- name 2 objects 2  Language- repeat 1  Language- follow 3 step command 3  Language- read & follow direction 1  Write a sentence 1  Copy design 1  Total score 30     6CIT Screen 01/31/2018  What Year? 0 points  What month? 0 points  What time? 0 points  Count back from 20 0 points  Months in reverse 0 points  Repeat phrase 0 points  Total Score 0    Immunization History  Administered Date(s) Administered  . Influenza Split 08/07/2012, 08/14/2013, 09/04/2015  . Influenza Whole 08/08/2010, 07/10/2011, 08/18/2011  . Influenza, High Dose Seasonal PF 09/02/2016, 08/11/2017  . Influenza-Unspecified 08/29/2014  . Pneumococcal Conjugate-13 01/07/2014  .  Pneumococcal Polysaccharide-23 08/08/2010  . Tdap 07/09/2009    Screening Tests Health Maintenance  Topic Date  Due  . TETANUS/TDAP  07/10/2019  . INFLUENZA VACCINE  Completed  . DEXA SCAN  Completed  . PNA vac Low Risk Adult  Completed       Plan:    End of life planning; Advance aging; Advanced directives discussed. Copy of current HCPOA/Living Will requested.    I have personally reviewed and noted the following in the patient's chart:   . Medical and social history . Use of alcohol, tobacco or illicit drugs  . Current medications and supplements . Functional ability and status . Nutritional status . Physical activity . Advanced directives . List of other physicians . Hospitalizations, surgeries, and ER visits in previous 12 months . Vitals . Screenings to include cognitive, depression, and falls . Referrals and appointments  In addition, I have reviewed and discussed with patient certain preventive protocols, quality metrics, and best practice recommendations. A written personalized care plan for preventive services as well as general preventive health recommendations were provided to patient.     Varney Biles, LPN  [id number]/56/[id number]   Reviewed above information.  Agree with assessment and plan.    Dr Nicki Reaper

## 2018-01-31 NOTE — Patient Instructions (Addendum)
  Hannah Kline , Thank you for taking time to come for your Medicare Wellness Visit. I appreciate your ongoing commitment to your health goals. Please review the following plan we discussed and let me know if I can assist you in the future.   Follow up with Dr. Derrel Nip as needed.    Bring a copy of your [place] and/or Living Will to be scanned into chart.  Have a great day!  These are the goals we discussed: Goals    None      This is a list of the screening recommended for you and due dates:  Health Maintenance  Topic Date Due  . Tetanus Vaccine  07/10/2019  . Flu Shot  Completed  . DEXA scan (bone density measurement)  Completed  . Pneumonia vaccines  Completed

## 2018-02-01 DIAGNOSIS — A312 Disseminated mycobacterium avium-intracellulare complex (DMAC): Secondary | ICD-10-CM | POA: Diagnosis not present

## 2018-02-01 DIAGNOSIS — I272 Pulmonary hypertension, unspecified: Secondary | ICD-10-CM | POA: Diagnosis not present

## 2018-02-01 DIAGNOSIS — I4891 Unspecified atrial fibrillation: Secondary | ICD-10-CM | POA: Diagnosis not present

## 2018-02-01 DIAGNOSIS — J479 Bronchiectasis, uncomplicated: Secondary | ICD-10-CM | POA: Diagnosis not present

## 2018-02-01 DIAGNOSIS — J449 Chronic obstructive pulmonary disease, unspecified: Secondary | ICD-10-CM | POA: Diagnosis not present

## 2018-02-01 DIAGNOSIS — J9621 Acute and chronic respiratory failure with hypoxia: Secondary | ICD-10-CM | POA: Diagnosis not present

## 2018-02-03 ENCOUNTER — Telehealth: Payer: Self-pay | Admitting: Pulmonary Disease

## 2018-02-03 NOTE — Telephone Encounter (Signed)
Left message for patient to call back  

## 2018-02-06 DIAGNOSIS — Z9981 Dependence on supplemental oxygen: Secondary | ICD-10-CM | POA: Diagnosis not present

## 2018-02-06 DIAGNOSIS — I272 Pulmonary hypertension, unspecified: Secondary | ICD-10-CM | POA: Diagnosis not present

## 2018-02-06 DIAGNOSIS — J479 Bronchiectasis, uncomplicated: Secondary | ICD-10-CM | POA: Diagnosis not present

## 2018-02-06 DIAGNOSIS — R634 Abnormal weight loss: Secondary | ICD-10-CM | POA: Diagnosis not present

## 2018-02-06 DIAGNOSIS — A312 Disseminated mycobacterium avium-intracellulare complex (DMAC): Secondary | ICD-10-CM | POA: Diagnosis not present

## 2018-02-06 DIAGNOSIS — J9621 Acute and chronic respiratory failure with hypoxia: Secondary | ICD-10-CM | POA: Diagnosis not present

## 2018-02-06 DIAGNOSIS — J449 Chronic obstructive pulmonary disease, unspecified: Secondary | ICD-10-CM | POA: Diagnosis not present

## 2018-02-06 DIAGNOSIS — I4891 Unspecified atrial fibrillation: Secondary | ICD-10-CM | POA: Diagnosis not present

## 2018-02-06 DIAGNOSIS — Z681 Body mass index (BMI) 19 or less, adult: Secondary | ICD-10-CM | POA: Diagnosis not present

## 2018-02-06 DIAGNOSIS — E039 Hypothyroidism, unspecified: Secondary | ICD-10-CM | POA: Diagnosis not present

## 2018-02-06 NOTE — Telephone Encounter (Signed)
Judson Roch called me back from [employer].  She is going to call home office & verify pt received vest from them & that it is paid in full.  She will call me back.

## 2018-02-06 NOTE — Telephone Encounter (Signed)
Judson Roch called me back.  She is going to reach out to pt about how she will be able to get the vest from her.  Nothing further needed from our end.

## 2018-02-06 NOTE — Telephone Encounter (Signed)
Called and spoke to patient. Patient stated she wants to donate her vest because it is too much for her to use anymore and shared that she has end stage lung disease and doesn't feel like wearing the vest is worth it.   Patient would like to donate the vest so that anyone else may use it. Patient reported that it's a [employer] Enc[employer] vest.   Will route to PCCs to see if a donation can be made.

## 2018-02-06 NOTE — Telephone Encounter (Signed)
Called Sarah with [employer] and had to leave vm.  Called Marquitta with [employer] & had to leave her vm as well.

## 2018-02-08 DIAGNOSIS — I272 Pulmonary hypertension, unspecified: Secondary | ICD-10-CM | POA: Diagnosis not present

## 2018-02-08 DIAGNOSIS — J9621 Acute and chronic respiratory failure with hypoxia: Secondary | ICD-10-CM | POA: Diagnosis not present

## 2018-02-08 DIAGNOSIS — A312 Disseminated mycobacterium avium-intracellulare complex (DMAC): Secondary | ICD-10-CM | POA: Diagnosis not present

## 2018-02-08 DIAGNOSIS — J479 Bronchiectasis, uncomplicated: Secondary | ICD-10-CM | POA: Diagnosis not present

## 2018-02-08 DIAGNOSIS — I4891 Unspecified atrial fibrillation: Secondary | ICD-10-CM | POA: Diagnosis not present

## 2018-02-08 DIAGNOSIS — J449 Chronic obstructive pulmonary disease, unspecified: Secondary | ICD-10-CM | POA: Diagnosis not present

## 2018-02-10 DIAGNOSIS — I272 Pulmonary hypertension, unspecified: Secondary | ICD-10-CM | POA: Diagnosis not present

## 2018-02-10 DIAGNOSIS — I4891 Unspecified atrial fibrillation: Secondary | ICD-10-CM | POA: Diagnosis not present

## 2018-02-10 DIAGNOSIS — J9621 Acute and chronic respiratory failure with hypoxia: Secondary | ICD-10-CM | POA: Diagnosis not present

## 2018-02-10 DIAGNOSIS — J449 Chronic obstructive pulmonary disease, unspecified: Secondary | ICD-10-CM | POA: Diagnosis not present

## 2018-02-10 DIAGNOSIS — A312 Disseminated mycobacterium avium-intracellulare complex (DMAC): Secondary | ICD-10-CM | POA: Diagnosis not present

## 2018-02-10 DIAGNOSIS — J479 Bronchiectasis, uncomplicated: Secondary | ICD-10-CM | POA: Diagnosis not present

## 2018-02-11 DIAGNOSIS — I4891 Unspecified atrial fibrillation: Secondary | ICD-10-CM | POA: Diagnosis not present

## 2018-02-11 DIAGNOSIS — J479 Bronchiectasis, uncomplicated: Secondary | ICD-10-CM | POA: Diagnosis not present

## 2018-02-11 DIAGNOSIS — A312 Disseminated mycobacterium avium-intracellulare complex (DMAC): Secondary | ICD-10-CM | POA: Diagnosis not present

## 2018-02-11 DIAGNOSIS — J9621 Acute and chronic respiratory failure with hypoxia: Secondary | ICD-10-CM | POA: Diagnosis not present

## 2018-02-11 DIAGNOSIS — J449 Chronic obstructive pulmonary disease, unspecified: Secondary | ICD-10-CM | POA: Diagnosis not present

## 2018-02-11 DIAGNOSIS — I272 Pulmonary hypertension, unspecified: Secondary | ICD-10-CM | POA: Diagnosis not present

## 2018-02-13 ENCOUNTER — Encounter: Payer: Self-pay | Admitting: Internal Medicine

## 2018-02-13 ENCOUNTER — Ambulatory Visit (INDEPENDENT_AMBULATORY_CARE_PROVIDER_SITE_OTHER): Admitting: Internal Medicine

## 2018-02-13 VITALS — BP 98/56 | HR 109 | Temp 97.6°F | Resp 17 | Ht 68.0 in | Wt 107.0 lb

## 2018-02-13 DIAGNOSIS — J479 Bronchiectasis, uncomplicated: Secondary | ICD-10-CM | POA: Diagnosis not present

## 2018-02-13 DIAGNOSIS — I4891 Unspecified atrial fibrillation: Secondary | ICD-10-CM | POA: Diagnosis not present

## 2018-02-13 DIAGNOSIS — A312 Disseminated mycobacterium avium-intracellulare complex (DMAC): Secondary | ICD-10-CM | POA: Diagnosis not present

## 2018-02-13 DIAGNOSIS — J449 Chronic obstructive pulmonary disease, unspecified: Secondary | ICD-10-CM | POA: Diagnosis not present

## 2018-02-13 DIAGNOSIS — D72823 Leukemoid reaction: Secondary | ICD-10-CM | POA: Diagnosis not present

## 2018-02-13 DIAGNOSIS — I272 Pulmonary hypertension, unspecified: Secondary | ICD-10-CM | POA: Diagnosis not present

## 2018-02-13 DIAGNOSIS — I480 Paroxysmal atrial fibrillation: Secondary | ICD-10-CM

## 2018-02-13 DIAGNOSIS — R000 Tachycardia, unspecified: Secondary | ICD-10-CM

## 2018-02-13 DIAGNOSIS — J9621 Acute and chronic respiratory failure with hypoxia: Secondary | ICD-10-CM | POA: Diagnosis not present

## 2018-02-13 DIAGNOSIS — R5381 Other malaise: Secondary | ICD-10-CM

## 2018-02-13 DIAGNOSIS — L509 Urticaria, unspecified: Secondary | ICD-10-CM | POA: Diagnosis not present

## 2018-02-13 DIAGNOSIS — R5383 Other fatigue: Secondary | ICD-10-CM | POA: Diagnosis not present

## 2018-02-13 MED ORDER — PREDNISONE 10 MG PO TABS: 10.0000 mg | ORAL_TABLET | Freq: Every day | ORAL | 1 refills | Status: DC

## 2018-02-13 NOTE — Patient Instructions (Addendum)
Use diltiazem as needed (every 8 hourts ) for heart rate of 110 or higher    AFTER 2 WEEKS OF 10 MG ,  TRY REDUCING YOUR DOSE OF PREDNISONE TO 5 MG

## 2018-02-13 NOTE — Progress Notes (Signed)
Subjective:  Patient ID: Hannah Kline, female    DOB: 05/09/1932  Age: 84 y.o. MRN: [id number]  CC: The primary encounter diagnosis was Tachycardia. Diagnoses of Paroxysmal A-fib ([place]), Urticarial dermatitis, Malaise and fatigue, and Leukemoid reaction were also pertinent to this visit.  HPI Hannah Kline presents for follow up on nausea, itching and progressive dyspnea  Seen for one week follow up  on March 15 and urticaria had  improved with initiation of  high doses of allegra (360 Mg) based on guidelines for treating chronic urticaria.  She continues to enjoy the relief from pruritus.   At last visit she was Started on a prednisone taper for wheezing noted on exam.  She continued taking 20 mg daily after the taper , but recently reduced her dose to 10 mg daily about 3 days ago..    She has been noting Heart rate variability even at rest  But has been asymptomatic.  She has a history of PAF and has a large supply of short acting cardizem so she has been using it prn with no adverse effects.   Denies LE swelling,  Acute dyspnea,  And chestpain .    Lab Results  Component Value Date   TSH 3.55 07/27/2017     Outpatient Medications Prior to Visit  Medication Sig Dispense Refill  . albuterol (PROVENTIL HFA;VENTOLIN HFA) 108 (90 Base) MCG/ACT inhaler Inhale 2 puffs into the lungs every 4 (four) hours as needed for wheezing or shortness of breath. 1 Inhaler 2  . arformoterol (BROVANA) 15 MCG/2ML NEBU Take 2 mLs (15 mcg total) by nebulization 2 (two) times daily. 120 mL 11  . aspirin EC 81 MG tablet Take 81 mg by mouth daily.    . budesonide (PULMICORT) 0.5 MG/2ML nebulizer solution Take 2 mLs (0.5 mg total) by nebulization 2 (two) times daily. 120 mL 11  . CVS SENNA PLUS 8.6-50 MG tablet TAKE 1-2 TABS BY MOUTH TWICE DAILY  3  . diltiazem (CARDIZEM) 30 MG tablet Take 1 tablet (30 mg total) by mouth 3 (three) times daily as needed (atrial fibrillation). 270 tablet 3  . dorzolamide (TRUSOPT) 2  % ophthalmic solution Place 1 drop into the left eye 2 (two) times daily.  7  . famotidine (PEPCID) 20 MG tablet Take 1 tablet (20 mg total) by mouth 2 (two) times daily. 180 tablet 2  . fexofenadine (ALLEGRA) 180 MG tablet Take 1 tablet (180 mg total) by mouth 4 (four) times daily. Gradual increase 120 tablet 2  . gabapentin (NEURONTIN) 100 MG capsule Take 1 capsule (100 mg total) by mouth 3 (three) times daily. 90 capsule 3  . levothyroxine (SYNTHROID, LEVOTHROID) 75 MCG tablet Take 1 tablet (75 mcg total) by mouth daily. 90 tablet 0  . OXYGEN Inhale 2 L into the lungs. [place] - when exercising, walking around, and at night    . propafenone (RYTHMOL) 150 MG tablet Take 1 tablet (150 mg total) by mouth 2 (two) times daily. 60 tablet 6  . Respiratory Therapy Supplies (FLUTTER) DEVI Use as directed 1 each 0  . predniSONE (DELTASONE) 10 MG tablet 6 tablets on Day 1 , then reduce by 1 tablet daily until gone 21 tablet 0   No facility-administered medications prior to visit.     Review of Systems;  Patient denies headache, fevers, malaise, unintentional weight loss, skin rash, eye pain, sinus congestion and sinus pain, sore throat, dysphagia,  hemoptysis , cough, dyspnea, wheezing, chest pain, palpitations, orthopnea, edema, abdominal  pain, nausea, melena, diarrhea, constipation, flank pain, dysuria, hematuria, urinary  Frequency, nocturia, numbness, tingling, seizures,  Focal weakness, Loss of consciousness,  Tremor, insomnia, depression, anxiety, and suicidal ideation.      Objective:  BP (!) 98/56 (BP Location: Left Arm, Patient Position: Sitting, Cuff Size: Normal)   Pulse (!) 109   Temp 97.6 F (36.4 C) (Oral)   Resp 17   Ht 5\' 8"  (1.727 m)   Wt 107 lb (48.5 kg)   SpO2 94%   BMI 16.27 kg/m   BP Readings from Last 3 Encounters:  02/13/18 (!) 98/56  01/31/18 110/70  01/20/18 (!) 120/58    Wt Readings from Last 3 Encounters:  02/13/18 107 lb (48.5 kg)  01/31/18 103 lb 1.9 oz (46.8  kg)  01/20/18 100 lb (45.4 kg)    General appearance: alert, cooperative and appears stated age Ears: normal TM's and external ear canals both ears Throat: lips, mucosa, and tongue normal; teeth and gums normal Neck: no adenopathy, no carotid bruit, supple, symmetrical, trachea midline and thyroid not enlarged, symmetric, no tenderness/mass/nodules Back: symmetric, no curvature. ROM normal. No CVA tenderness. Lungs: clear to auscultation bilaterally Heart: regular rate and rhythm, S1, S2 normal, no murmur, click, rub or gallop Abdomen: soft, non-tender; bowel sounds normal; no masses,  no organomegaly Pulses: 2+ and symmetric Skin: Skin color, texture, turgor normal. No rashes or lesions Lymph nodes: Cervical, supraclavicular, and axillary nodes normal.  Lab Results  Component Value Date   HGBA1C 5.8 (H) 11/22/2014    Lab Results  Component Value Date   CREATININE 0.79 01/20/2018   CREATININE 0.77 10/01/2017   CREATININE 0.79 08/30/2017    Lab Results  Component Value Date   WBC 13.5 (H) 01/20/2018   HGB 12.7 01/20/2018   HCT 38.7 01/20/2018   PLT 278.0 01/20/2018   GLUCOSE 108 (H) 01/20/2018   CHOL 177 01/22/2016   TRIG 78.0 01/22/2016   HDL 53.50 01/22/2016   LDLCALC 108 (H) 01/22/2016   ALT 14 01/20/2018   AST 16 01/20/2018   NA 138 01/20/2018   K 4.1 01/20/2018   CL 98 01/20/2018   CREATININE 0.79 01/20/2018   BUN 14 01/20/2018   CO2 36 (H) 01/20/2018   TSH 3.55 07/27/2017   INR 1.22 06/14/2016   HGBA1C 5.8 (H) 11/22/2014    Dg Chest 2 View  Result Date: 10/01/2017 CLINICAL DATA:  Shortness of breath. EXAM: CHEST  2 VIEW COMPARISON:  Radiographs of August 30, 2017. FINDINGS: The heart size and mediastinal contours are within normal limits. No pneumothorax or pleural effusion is noted. Stable reticular and nodular densities are noted throughout both lungs consistent with prior mycobacterium infection. No new opacities are noted. The visualized skeletal  structures are unremarkable. IMPRESSION: Stable bilateral lung opacities are noted most consistent with history of prior mycobacterium infection. No significant changes noted compared to prior exam. Electronically Signed   By: Marijo Conception, M.D.   On: 10/01/2017 14:53    Assessment & Plan:   Problem List Items Addressed This Visit    Urticarial dermatitis    Improved with treatment for chronic urticaria (allegra dose currently 360 mg daily ).  Reminded to use good quality moisturizer daily as well       Paroxysmal A-fib ([place])    Her rhythm today is regular so no EGD was done.  continye cardizem 30 mg q 6 hours prn HR > 110      Malaise and fatigue  With anorexia weight loss , and chronic dypsnea,secondary to bronchiectasis and chronic hypoxic respiratory failure.   All improved with prednisone.  Advised to continue 10 mg daily x 2 weeks,  Then reduce dose to 5 mg daily and continue if tolerating the lowered dose       Leukocytosis    Recurrent  secondary to prednisone . No further workup  Lab Results  Component Value Date   WBC 13.5 (H) 01/20/2018   HGB 12.7 01/20/2018   HCT 38.7 01/20/2018   MCV 95.2 01/20/2018   PLT 278.0 01/20/2018          Other Visit Diagnoses    Tachycardia    -  Primary   Relevant Orders   TSH   CBC with Differential/Platelet   Comprehensive metabolic panel    A total of 25 minutes of face to face time was spent with patient more than half of which was spent in counselling about the above mentioned conditions  and coordination of care  I have changed Shirlee Limerick Robeck's predniSONE. I am also having her maintain her OXYGEN, diltiazem, budesonide, arformoterol, dorzolamide, levothyroxine, propafenone, FLUTTER, albuterol, famotidine, fexofenadine, gabapentin, aspirin EC, and CVS SENNA PLUS.  Meds ordered this encounter  Medications  . predniSONE (DELTASONE) 10 MG tablet    Sig: Take 1 tablet (10 mg total) by mouth daily with breakfast.    Dispense:   30 tablet    Refill:  1    Medications Discontinued During This Encounter  Medication Reason  . predniSONE (DELTASONE) 10 MG tablet     Follow-up: Return in about 3 months (around 05/15/2018).   Crecencio Mc, MD

## 2018-02-14 ENCOUNTER — Telehealth: Payer: Self-pay | Admitting: Radiology

## 2018-02-14 LAB — CBC WITH DIFFERENTIAL/PLATELET
BASOS ABS: 0.1 10*3/uL (ref 0.0–0.1)
Basophils Relative: 0.3 % (ref 0.0–3.0)
EOS ABS: 0.1 10*3/uL (ref 0.0–0.7)
Eosinophils Relative: 0.3 % (ref 0.0–5.0)
HCT: 37.8 % (ref 36.0–46.0)
Hemoglobin: 12.3 g/dL (ref 12.0–15.0)
LYMPHS ABS: 0.8 10*3/uL (ref 0.7–4.0)
MCHC: 32.6 g/dL (ref 30.0–36.0)
MCV: 95.4 fl (ref 78.0–100.0)
MONO ABS: 0.9 10*3/uL (ref 0.1–1.0)
Monocytes Relative: 4.3 % (ref 3.0–12.0)
NEUTROS ABS: 18.8 10*3/uL — AB (ref 1.4–7.7)
PLATELETS: 452 10*3/uL — AB (ref 150.0–400.0)
RBC: 3.96 Mil/uL (ref 3.87–5.11)
RDW: 14.2 % (ref 11.5–15.5)
WBC: 20.6 10*3/uL (ref 4.0–10.5)

## 2018-02-14 LAB — COMPREHENSIVE METABOLIC PANEL
ALT: 12 U/L (ref 0–35)
AST: 14 U/L (ref 0–37)
Albumin: 3.1 g/dL — ABNORMAL LOW (ref 3.5–5.2)
Alkaline Phosphatase: 74 U/L (ref 39–117)
BILIRUBIN TOTAL: 0.4 mg/dL (ref 0.2–1.2)
BUN: 17 mg/dL (ref 6–23)
CHLORIDE: 99 meq/L (ref 96–112)
CO2: 32 meq/L (ref 19–32)
Calcium: 8.7 mg/dL (ref 8.4–10.5)
Creatinine, Ser: 0.77 mg/dL (ref 0.40–1.20)
GFR: 75.97 mL/min (ref >60.00)
GLUCOSE: 137 mg/dL — AB (ref 70–99)
Potassium: 4.8 mEq/L (ref 3.5–5.1)
Sodium: 137 mEq/L (ref 135–145)
Total Protein: 7.7 g/dL (ref 6.0–8.3)

## 2018-02-14 LAB — TSH: TSH: 4.08 u[IU]/mL (ref 0.35–4.50)

## 2018-02-14 NOTE — Assessment & Plan Note (Signed)
With anorexia weight loss , and chronic dypsnea,secondary to bronchiectasis and chronic hypoxic respiratory failure.   All improved with prednisone.  Advised to continue 10 mg daily x 2 weeks,  Then reduce dose to 5 mg daily and continue if tolerating the lowered dose

## 2018-02-14 NOTE — Assessment & Plan Note (Signed)
Recurrent  secondary to prednisone . No further workup  Lab Results  Component Value Date   WBC 13.5 (H) 01/20/2018   HGB 12.7 01/20/2018   HCT 38.7 01/20/2018   MCV 95.2 01/20/2018   PLT 278.0 01/20/2018

## 2018-02-14 NOTE — Telephone Encounter (Signed)
Thank you for letting me know.  The white count is elevated because she it taking prednisone.  I will let her know

## 2018-02-14 NOTE — Telephone Encounter (Signed)
Error. See prev phone note.

## 2018-02-14 NOTE — Assessment & Plan Note (Signed)
Improved with treatment for chronic urticaria (allegra dose currently 360 mg daily ).  Reminded to use good quality moisturizer daily as well

## 2018-02-14 NOTE — Telephone Encounter (Signed)
[facility] called with pt critical lab results for WBC of 20.6. Verified and read back.

## 2018-02-14 NOTE — Assessment & Plan Note (Addendum)
Her rhythm today is regular so no EGD was done.  continye cardizem 30 mg q 6 hours prn HR > 110

## 2018-02-15 DIAGNOSIS — A312 Disseminated mycobacterium avium-intracellulare complex (DMAC): Secondary | ICD-10-CM | POA: Diagnosis not present

## 2018-02-15 DIAGNOSIS — I272 Pulmonary hypertension, unspecified: Secondary | ICD-10-CM | POA: Diagnosis not present

## 2018-02-15 DIAGNOSIS — J9621 Acute and chronic respiratory failure with hypoxia: Secondary | ICD-10-CM | POA: Diagnosis not present

## 2018-02-15 DIAGNOSIS — I4891 Unspecified atrial fibrillation: Secondary | ICD-10-CM | POA: Diagnosis not present

## 2018-02-15 DIAGNOSIS — J479 Bronchiectasis, uncomplicated: Secondary | ICD-10-CM | POA: Diagnosis not present

## 2018-02-15 DIAGNOSIS — J449 Chronic obstructive pulmonary disease, unspecified: Secondary | ICD-10-CM | POA: Diagnosis not present

## 2018-02-21 DIAGNOSIS — H401492 Capsular glaucoma with pseudoexfoliation of lens, unspecified eye, moderate stage: Secondary | ICD-10-CM | POA: Diagnosis not present

## 2018-02-22 DIAGNOSIS — J479 Bronchiectasis, uncomplicated: Secondary | ICD-10-CM | POA: Diagnosis not present

## 2018-02-22 DIAGNOSIS — J449 Chronic obstructive pulmonary disease, unspecified: Secondary | ICD-10-CM | POA: Diagnosis not present

## 2018-02-22 DIAGNOSIS — A312 Disseminated mycobacterium avium-intracellulare complex (DMAC): Secondary | ICD-10-CM | POA: Diagnosis not present

## 2018-02-22 DIAGNOSIS — I4891 Unspecified atrial fibrillation: Secondary | ICD-10-CM | POA: Diagnosis not present

## 2018-02-22 DIAGNOSIS — J9621 Acute and chronic respiratory failure with hypoxia: Secondary | ICD-10-CM | POA: Diagnosis not present

## 2018-02-22 DIAGNOSIS — I272 Pulmonary hypertension, unspecified: Secondary | ICD-10-CM | POA: Diagnosis not present

## 2018-02-23 DIAGNOSIS — I4891 Unspecified atrial fibrillation: Secondary | ICD-10-CM | POA: Diagnosis not present

## 2018-02-23 DIAGNOSIS — J449 Chronic obstructive pulmonary disease, unspecified: Secondary | ICD-10-CM | POA: Diagnosis not present

## 2018-02-23 DIAGNOSIS — J9621 Acute and chronic respiratory failure with hypoxia: Secondary | ICD-10-CM | POA: Diagnosis not present

## 2018-02-23 DIAGNOSIS — J479 Bronchiectasis, uncomplicated: Secondary | ICD-10-CM | POA: Diagnosis not present

## 2018-02-23 DIAGNOSIS — I272 Pulmonary hypertension, unspecified: Secondary | ICD-10-CM | POA: Diagnosis not present

## 2018-02-23 DIAGNOSIS — A312 Disseminated mycobacterium avium-intracellulare complex (DMAC): Secondary | ICD-10-CM | POA: Diagnosis not present

## 2018-02-27 DIAGNOSIS — H00011 Hordeolum externum right upper eyelid: Secondary | ICD-10-CM | POA: Diagnosis not present

## 2018-02-28 ENCOUNTER — Telehealth: Payer: Self-pay | Admitting: Pulmonary Disease

## 2018-02-28 NOTE — Telephone Encounter (Signed)
Rec'd from [place] forwarded 2 pages to Dr. Simonne Maffucci

## 2018-03-01 DIAGNOSIS — I4891 Unspecified atrial fibrillation: Secondary | ICD-10-CM | POA: Diagnosis not present

## 2018-03-01 DIAGNOSIS — J449 Chronic obstructive pulmonary disease, unspecified: Secondary | ICD-10-CM | POA: Diagnosis not present

## 2018-03-01 DIAGNOSIS — I272 Pulmonary hypertension, unspecified: Secondary | ICD-10-CM | POA: Diagnosis not present

## 2018-03-01 DIAGNOSIS — A312 Disseminated mycobacterium avium-intracellulare complex (DMAC): Secondary | ICD-10-CM | POA: Diagnosis not present

## 2018-03-01 DIAGNOSIS — J9621 Acute and chronic respiratory failure with hypoxia: Secondary | ICD-10-CM | POA: Diagnosis not present

## 2018-03-01 DIAGNOSIS — J479 Bronchiectasis, uncomplicated: Secondary | ICD-10-CM | POA: Diagnosis not present

## 2018-03-02 DIAGNOSIS — J9621 Acute and chronic respiratory failure with hypoxia: Secondary | ICD-10-CM | POA: Diagnosis not present

## 2018-03-02 DIAGNOSIS — I272 Pulmonary hypertension, unspecified: Secondary | ICD-10-CM | POA: Diagnosis not present

## 2018-03-02 DIAGNOSIS — A312 Disseminated mycobacterium avium-intracellulare complex (DMAC): Secondary | ICD-10-CM | POA: Diagnosis not present

## 2018-03-02 DIAGNOSIS — J449 Chronic obstructive pulmonary disease, unspecified: Secondary | ICD-10-CM | POA: Diagnosis not present

## 2018-03-02 DIAGNOSIS — I4891 Unspecified atrial fibrillation: Secondary | ICD-10-CM | POA: Diagnosis not present

## 2018-03-02 DIAGNOSIS — J479 Bronchiectasis, uncomplicated: Secondary | ICD-10-CM | POA: Diagnosis not present

## 2018-03-08 ENCOUNTER — Ambulatory Visit: Payer: Self-pay | Admitting: *Deleted

## 2018-03-08 DIAGNOSIS — R634 Abnormal weight loss: Secondary | ICD-10-CM | POA: Diagnosis not present

## 2018-03-08 DIAGNOSIS — J479 Bronchiectasis, uncomplicated: Secondary | ICD-10-CM | POA: Diagnosis not present

## 2018-03-08 DIAGNOSIS — Z9981 Dependence on supplemental oxygen: Secondary | ICD-10-CM | POA: Diagnosis not present

## 2018-03-08 DIAGNOSIS — J9621 Acute and chronic respiratory failure with hypoxia: Secondary | ICD-10-CM | POA: Diagnosis not present

## 2018-03-08 DIAGNOSIS — I4891 Unspecified atrial fibrillation: Secondary | ICD-10-CM | POA: Diagnosis not present

## 2018-03-08 DIAGNOSIS — E039 Hypothyroidism, unspecified: Secondary | ICD-10-CM | POA: Diagnosis not present

## 2018-03-08 DIAGNOSIS — A312 Disseminated mycobacterium avium-intracellulare complex (DMAC): Secondary | ICD-10-CM | POA: Diagnosis not present

## 2018-03-08 DIAGNOSIS — Z681 Body mass index (BMI) 19 or less, adult: Secondary | ICD-10-CM | POA: Diagnosis not present

## 2018-03-08 DIAGNOSIS — I272 Pulmonary hypertension, unspecified: Secondary | ICD-10-CM | POA: Diagnosis not present

## 2018-03-08 DIAGNOSIS — J449 Chronic obstructive pulmonary disease, unspecified: Secondary | ICD-10-CM | POA: Diagnosis not present

## 2018-03-08 MED ORDER — TORSEMIDE 20 MG PO TABS: 20.0000 mg | ORAL_TABLET | Freq: Every day | ORAL | 0 refills | Status: DC

## 2018-03-08 NOTE — Telephone Encounter (Signed)
Please advise 

## 2018-03-08 NOTE — Telephone Encounter (Signed)
Hospice nurse 'Tammy' called to report pt has had increased SOB, weight gain of 8.5lbs and 3+ edema both feet since end of March. States gradual onset after taking prednisone. Pt has increased O2 from 2ls to 4-5Ls.  Nurse reports B/P 123/85, HR 100. O2 sat. 94% on 4Ls. NT spoke with pt, no acute distress, speech fluent, not faltering . Pt declines ED. Hospice nurse states she doesn't think ED disposition neccessary.  Pt requesting advise  Re: prednisone. States she would like to stop taking. Please advise: [phone]  Reason for Disposition . [1] Longstanding difficulty breathing (e.g., CHF, COPD, emphysema) AND [2] WORSE than normal  Answer Assessment - Initial Assessment Questions 1. RESPIRATORY STATUS: "Describe your breathing?" (e.g., wheezing, shortness of breath, unable to speak, severe coughing)     Has increased O2 from 2 l's to 4-5 liters. On 4ls presently. O2 sat 94% on 4     l's. Resp's unlabored 2. ONSET: "When did this breathing problem begin?"      Gradual increase since started on prednisone 3. PATTERN "Does the difficult breathing come and go, or has it been constant since it started?"      Constant 4. SEVERITY: "How bad is your breathing?" (e.g., mild, moderate, severe)    - MILD: No SOB at rest, mild SOB with walking, speaks normally in sentences, can lay down, no retractions, pulse < 100.    - MODERATE: SOB at rest, SOB with minimal exertion and prefers to sit, cannot lie down flat, speaks in phrases, mild retractions, audible wheezing, pulse 100-120.    - SEVERE: Very SOB at rest, speaks in single words, struggling to breathe, sitting hunched forward, retractions, pulse > 120      Mild with increase of O2 5. RECURRENT SYMPTOM: "Have you had difficulty breathing before?" If so, ask: "When was the last time?" and "What happened that time?"      H/O chronic respiratory failure, Hospice patient. 6. CARDIAC HISTORY: "Do you have any history of heart disease?" (e.g., heart attack,  angina, bypass surgery, angioplasty)       7. LUNG HISTORY: "Do you have any history of lung disease?"  (e.g., pulmonary embolus, asthma, emphysema)     As above 8. CAUSE: "What do you think is causing the breathing problem?"      Prednisone 9. OTHER SYMPTOMS: "Do you have any other symptoms? (e.g., dizziness, runny nose, cough, chest pain, fever)     Per hospice nurse: Since end of March, 8.5 lb.  weight gain, Both feet now with 3+ pitting edema, thighs 3cm's increase. Nurse states symptoms started  shortly after starting prednisone.  Protocols used: BREATHING DIFFICULTY-A-AH

## 2018-03-08 NOTE — Telephone Encounter (Signed)
Does not need to go to ER.  Diuretic torsemide sent to pharmacy ,  Start taking today.  She cannot stop the prednisone cold [place],  But if she is taking 10 mg daily she can reduce the dose to 1/2 tablet daily for one week,  Then stop it.

## 2018-03-08 NOTE — Telephone Encounter (Signed)
Spoke with pt and advised her that Dr. Derrel Nip has sent in a diuretic called torsemide for the her to take daily until her weight is back to her normal weight and then just to take it as needed. Also explained to the pt that she would need to reduce the prednisone to a half a tablet for a week since she stated that she was taking the 10mg  dose and after a week of taking a half tablet she could stop the prednisone. Pt gave a verbal understanding.

## 2018-03-09 DIAGNOSIS — I272 Pulmonary hypertension, unspecified: Secondary | ICD-10-CM | POA: Diagnosis not present

## 2018-03-09 DIAGNOSIS — J449 Chronic obstructive pulmonary disease, unspecified: Secondary | ICD-10-CM | POA: Diagnosis not present

## 2018-03-09 DIAGNOSIS — I4891 Unspecified atrial fibrillation: Secondary | ICD-10-CM | POA: Diagnosis not present

## 2018-03-09 DIAGNOSIS — J479 Bronchiectasis, uncomplicated: Secondary | ICD-10-CM | POA: Diagnosis not present

## 2018-03-09 DIAGNOSIS — A312 Disseminated mycobacterium avium-intracellulare complex (DMAC): Secondary | ICD-10-CM | POA: Diagnosis not present

## 2018-03-09 DIAGNOSIS — J9621 Acute and chronic respiratory failure with hypoxia: Secondary | ICD-10-CM | POA: Diagnosis not present

## 2018-03-12 DIAGNOSIS — I4891 Unspecified atrial fibrillation: Secondary | ICD-10-CM | POA: Diagnosis not present

## 2018-03-12 DIAGNOSIS — J9621 Acute and chronic respiratory failure with hypoxia: Secondary | ICD-10-CM | POA: Diagnosis not present

## 2018-03-12 DIAGNOSIS — A312 Disseminated mycobacterium avium-intracellulare complex (DMAC): Secondary | ICD-10-CM | POA: Diagnosis not present

## 2018-03-12 DIAGNOSIS — I272 Pulmonary hypertension, unspecified: Secondary | ICD-10-CM | POA: Diagnosis not present

## 2018-03-12 DIAGNOSIS — J449 Chronic obstructive pulmonary disease, unspecified: Secondary | ICD-10-CM | POA: Diagnosis not present

## 2018-03-12 DIAGNOSIS — J479 Bronchiectasis, uncomplicated: Secondary | ICD-10-CM | POA: Diagnosis not present

## 2018-03-15 DIAGNOSIS — J479 Bronchiectasis, uncomplicated: Secondary | ICD-10-CM | POA: Diagnosis not present

## 2018-03-15 DIAGNOSIS — I272 Pulmonary hypertension, unspecified: Secondary | ICD-10-CM | POA: Diagnosis not present

## 2018-03-15 DIAGNOSIS — A312 Disseminated mycobacterium avium-intracellulare complex (DMAC): Secondary | ICD-10-CM | POA: Diagnosis not present

## 2018-03-15 DIAGNOSIS — J9621 Acute and chronic respiratory failure with hypoxia: Secondary | ICD-10-CM | POA: Diagnosis not present

## 2018-03-15 DIAGNOSIS — I4891 Unspecified atrial fibrillation: Secondary | ICD-10-CM | POA: Diagnosis not present

## 2018-03-15 DIAGNOSIS — J449 Chronic obstructive pulmonary disease, unspecified: Secondary | ICD-10-CM | POA: Diagnosis not present

## 2018-03-19 ENCOUNTER — Other Ambulatory Visit: Payer: Self-pay | Admitting: Cardiovascular Disease

## 2018-03-20 DIAGNOSIS — J9621 Acute and chronic respiratory failure with hypoxia: Secondary | ICD-10-CM | POA: Diagnosis not present

## 2018-03-20 DIAGNOSIS — I272 Pulmonary hypertension, unspecified: Secondary | ICD-10-CM | POA: Diagnosis not present

## 2018-03-20 DIAGNOSIS — J479 Bronchiectasis, uncomplicated: Secondary | ICD-10-CM | POA: Diagnosis not present

## 2018-03-20 DIAGNOSIS — A312 Disseminated mycobacterium avium-intracellulare complex (DMAC): Secondary | ICD-10-CM | POA: Diagnosis not present

## 2018-03-20 DIAGNOSIS — J449 Chronic obstructive pulmonary disease, unspecified: Secondary | ICD-10-CM | POA: Diagnosis not present

## 2018-03-20 DIAGNOSIS — I4891 Unspecified atrial fibrillation: Secondary | ICD-10-CM | POA: Diagnosis not present

## 2018-03-21 DIAGNOSIS — A312 Disseminated mycobacterium avium-intracellulare complex (DMAC): Secondary | ICD-10-CM | POA: Diagnosis not present

## 2018-03-21 DIAGNOSIS — J479 Bronchiectasis, uncomplicated: Secondary | ICD-10-CM | POA: Diagnosis not present

## 2018-03-21 DIAGNOSIS — J449 Chronic obstructive pulmonary disease, unspecified: Secondary | ICD-10-CM | POA: Diagnosis not present

## 2018-03-21 DIAGNOSIS — I272 Pulmonary hypertension, unspecified: Secondary | ICD-10-CM | POA: Diagnosis not present

## 2018-03-21 DIAGNOSIS — J9621 Acute and chronic respiratory failure with hypoxia: Secondary | ICD-10-CM | POA: Diagnosis not present

## 2018-03-21 DIAGNOSIS — I4891 Unspecified atrial fibrillation: Secondary | ICD-10-CM | POA: Diagnosis not present

## 2018-03-22 DIAGNOSIS — J479 Bronchiectasis, uncomplicated: Secondary | ICD-10-CM | POA: Diagnosis not present

## 2018-03-22 DIAGNOSIS — J9621 Acute and chronic respiratory failure with hypoxia: Secondary | ICD-10-CM | POA: Diagnosis not present

## 2018-03-22 DIAGNOSIS — A312 Disseminated mycobacterium avium-intracellulare complex (DMAC): Secondary | ICD-10-CM | POA: Diagnosis not present

## 2018-03-22 DIAGNOSIS — J449 Chronic obstructive pulmonary disease, unspecified: Secondary | ICD-10-CM | POA: Diagnosis not present

## 2018-03-22 DIAGNOSIS — I272 Pulmonary hypertension, unspecified: Secondary | ICD-10-CM | POA: Diagnosis not present

## 2018-03-22 DIAGNOSIS — I4891 Unspecified atrial fibrillation: Secondary | ICD-10-CM | POA: Diagnosis not present

## 2018-03-28 DIAGNOSIS — H35373 Puckering of macula, bilateral: Secondary | ICD-10-CM | POA: Diagnosis not present

## 2018-03-29 DIAGNOSIS — I272 Pulmonary hypertension, unspecified: Secondary | ICD-10-CM | POA: Diagnosis not present

## 2018-03-29 DIAGNOSIS — I4891 Unspecified atrial fibrillation: Secondary | ICD-10-CM | POA: Diagnosis not present

## 2018-03-29 DIAGNOSIS — J479 Bronchiectasis, uncomplicated: Secondary | ICD-10-CM | POA: Diagnosis not present

## 2018-03-29 DIAGNOSIS — J449 Chronic obstructive pulmonary disease, unspecified: Secondary | ICD-10-CM | POA: Diagnosis not present

## 2018-03-29 DIAGNOSIS — J9621 Acute and chronic respiratory failure with hypoxia: Secondary | ICD-10-CM | POA: Diagnosis not present

## 2018-03-29 DIAGNOSIS — A312 Disseminated mycobacterium avium-intracellulare complex (DMAC): Secondary | ICD-10-CM | POA: Diagnosis not present

## 2018-03-31 DIAGNOSIS — I272 Pulmonary hypertension, unspecified: Secondary | ICD-10-CM | POA: Diagnosis not present

## 2018-03-31 DIAGNOSIS — J449 Chronic obstructive pulmonary disease, unspecified: Secondary | ICD-10-CM | POA: Diagnosis not present

## 2018-03-31 DIAGNOSIS — J9621 Acute and chronic respiratory failure with hypoxia: Secondary | ICD-10-CM | POA: Diagnosis not present

## 2018-03-31 DIAGNOSIS — A312 Disseminated mycobacterium avium-intracellulare complex (DMAC): Secondary | ICD-10-CM | POA: Diagnosis not present

## 2018-03-31 DIAGNOSIS — J479 Bronchiectasis, uncomplicated: Secondary | ICD-10-CM | POA: Diagnosis not present

## 2018-03-31 DIAGNOSIS — I4891 Unspecified atrial fibrillation: Secondary | ICD-10-CM | POA: Diagnosis not present

## 2018-04-04 DIAGNOSIS — J9621 Acute and chronic respiratory failure with hypoxia: Secondary | ICD-10-CM | POA: Diagnosis not present

## 2018-04-04 DIAGNOSIS — J449 Chronic obstructive pulmonary disease, unspecified: Secondary | ICD-10-CM | POA: Diagnosis not present

## 2018-04-04 DIAGNOSIS — I272 Pulmonary hypertension, unspecified: Secondary | ICD-10-CM | POA: Diagnosis not present

## 2018-04-04 DIAGNOSIS — A312 Disseminated mycobacterium avium-intracellulare complex (DMAC): Secondary | ICD-10-CM | POA: Diagnosis not present

## 2018-04-04 DIAGNOSIS — I4891 Unspecified atrial fibrillation: Secondary | ICD-10-CM | POA: Diagnosis not present

## 2018-04-04 DIAGNOSIS — J479 Bronchiectasis, uncomplicated: Secondary | ICD-10-CM | POA: Diagnosis not present

## 2018-04-05 ENCOUNTER — Other Ambulatory Visit: Payer: Self-pay | Admitting: Internal Medicine

## 2018-04-05 DIAGNOSIS — J449 Chronic obstructive pulmonary disease, unspecified: Secondary | ICD-10-CM | POA: Diagnosis not present

## 2018-04-05 DIAGNOSIS — I272 Pulmonary hypertension, unspecified: Secondary | ICD-10-CM | POA: Diagnosis not present

## 2018-04-05 DIAGNOSIS — J9621 Acute and chronic respiratory failure with hypoxia: Secondary | ICD-10-CM | POA: Diagnosis not present

## 2018-04-05 DIAGNOSIS — E039 Hypothyroidism, unspecified: Secondary | ICD-10-CM

## 2018-04-05 DIAGNOSIS — J479 Bronchiectasis, uncomplicated: Secondary | ICD-10-CM | POA: Diagnosis not present

## 2018-04-05 DIAGNOSIS — A312 Disseminated mycobacterium avium-intracellulare complex (DMAC): Secondary | ICD-10-CM | POA: Diagnosis not present

## 2018-04-05 DIAGNOSIS — I4891 Unspecified atrial fibrillation: Secondary | ICD-10-CM | POA: Diagnosis not present

## 2018-04-06 ENCOUNTER — Other Ambulatory Visit: Payer: Self-pay

## 2018-04-06 DIAGNOSIS — J9621 Acute and chronic respiratory failure with hypoxia: Secondary | ICD-10-CM | POA: Diagnosis not present

## 2018-04-06 DIAGNOSIS — J449 Chronic obstructive pulmonary disease, unspecified: Secondary | ICD-10-CM | POA: Diagnosis not present

## 2018-04-06 DIAGNOSIS — I272 Pulmonary hypertension, unspecified: Secondary | ICD-10-CM | POA: Diagnosis not present

## 2018-04-06 DIAGNOSIS — A312 Disseminated mycobacterium avium-intracellulare complex (DMAC): Secondary | ICD-10-CM | POA: Diagnosis not present

## 2018-04-06 DIAGNOSIS — I4891 Unspecified atrial fibrillation: Secondary | ICD-10-CM | POA: Diagnosis not present

## 2018-04-06 DIAGNOSIS — J479 Bronchiectasis, uncomplicated: Secondary | ICD-10-CM | POA: Diagnosis not present

## 2018-04-06 DIAGNOSIS — E039 Hypothyroidism, unspecified: Secondary | ICD-10-CM

## 2018-04-06 MED ORDER — LEVOTHYROXINE SODIUM 75 MCG PO TABS: 75.0000 ug | ORAL_TABLET | Freq: Every day | ORAL | 1 refills | Status: AC

## 2018-04-08 DIAGNOSIS — A312 Disseminated mycobacterium avium-intracellulare complex (DMAC): Secondary | ICD-10-CM | POA: Diagnosis not present

## 2018-04-08 DIAGNOSIS — I272 Pulmonary hypertension, unspecified: Secondary | ICD-10-CM | POA: Diagnosis not present

## 2018-04-08 DIAGNOSIS — Z681 Body mass index (BMI) 19 or less, adult: Secondary | ICD-10-CM | POA: Diagnosis not present

## 2018-04-08 DIAGNOSIS — J449 Chronic obstructive pulmonary disease, unspecified: Secondary | ICD-10-CM | POA: Diagnosis not present

## 2018-04-08 DIAGNOSIS — J9621 Acute and chronic respiratory failure with hypoxia: Secondary | ICD-10-CM | POA: Diagnosis not present

## 2018-04-08 DIAGNOSIS — I4891 Unspecified atrial fibrillation: Secondary | ICD-10-CM | POA: Diagnosis not present

## 2018-04-08 DIAGNOSIS — J479 Bronchiectasis, uncomplicated: Secondary | ICD-10-CM | POA: Diagnosis not present

## 2018-04-08 DIAGNOSIS — E039 Hypothyroidism, unspecified: Secondary | ICD-10-CM | POA: Diagnosis not present

## 2018-04-08 DIAGNOSIS — R634 Abnormal weight loss: Secondary | ICD-10-CM | POA: Diagnosis not present

## 2018-04-08 DIAGNOSIS — Z9981 Dependence on supplemental oxygen: Secondary | ICD-10-CM | POA: Diagnosis not present

## 2018-04-11 DIAGNOSIS — A312 Disseminated mycobacterium avium-intracellulare complex (DMAC): Secondary | ICD-10-CM | POA: Diagnosis not present

## 2018-04-11 DIAGNOSIS — J479 Bronchiectasis, uncomplicated: Secondary | ICD-10-CM | POA: Diagnosis not present

## 2018-04-11 DIAGNOSIS — I272 Pulmonary hypertension, unspecified: Secondary | ICD-10-CM | POA: Diagnosis not present

## 2018-04-11 DIAGNOSIS — J9621 Acute and chronic respiratory failure with hypoxia: Secondary | ICD-10-CM | POA: Diagnosis not present

## 2018-04-11 DIAGNOSIS — I4891 Unspecified atrial fibrillation: Secondary | ICD-10-CM | POA: Diagnosis not present

## 2018-04-11 DIAGNOSIS — J449 Chronic obstructive pulmonary disease, unspecified: Secondary | ICD-10-CM | POA: Diagnosis not present

## 2018-04-12 DIAGNOSIS — J479 Bronchiectasis, uncomplicated: Secondary | ICD-10-CM | POA: Diagnosis not present

## 2018-04-12 DIAGNOSIS — I272 Pulmonary hypertension, unspecified: Secondary | ICD-10-CM | POA: Diagnosis not present

## 2018-04-12 DIAGNOSIS — A312 Disseminated mycobacterium avium-intracellulare complex (DMAC): Secondary | ICD-10-CM | POA: Diagnosis not present

## 2018-04-12 DIAGNOSIS — J449 Chronic obstructive pulmonary disease, unspecified: Secondary | ICD-10-CM | POA: Diagnosis not present

## 2018-04-12 DIAGNOSIS — I4891 Unspecified atrial fibrillation: Secondary | ICD-10-CM | POA: Diagnosis not present

## 2018-04-12 DIAGNOSIS — J9621 Acute and chronic respiratory failure with hypoxia: Secondary | ICD-10-CM | POA: Diagnosis not present

## 2018-04-14 DIAGNOSIS — J9621 Acute and chronic respiratory failure with hypoxia: Secondary | ICD-10-CM | POA: Diagnosis not present

## 2018-04-14 DIAGNOSIS — I272 Pulmonary hypertension, unspecified: Secondary | ICD-10-CM | POA: Diagnosis not present

## 2018-04-14 DIAGNOSIS — J479 Bronchiectasis, uncomplicated: Secondary | ICD-10-CM | POA: Diagnosis not present

## 2018-04-14 DIAGNOSIS — A312 Disseminated mycobacterium avium-intracellulare complex (DMAC): Secondary | ICD-10-CM | POA: Diagnosis not present

## 2018-04-14 DIAGNOSIS — I4891 Unspecified atrial fibrillation: Secondary | ICD-10-CM | POA: Diagnosis not present

## 2018-04-14 DIAGNOSIS — J449 Chronic obstructive pulmonary disease, unspecified: Secondary | ICD-10-CM | POA: Diagnosis not present

## 2018-04-19 DIAGNOSIS — A312 Disseminated mycobacterium avium-intracellulare complex (DMAC): Secondary | ICD-10-CM | POA: Diagnosis not present

## 2018-04-19 DIAGNOSIS — J9621 Acute and chronic respiratory failure with hypoxia: Secondary | ICD-10-CM | POA: Diagnosis not present

## 2018-04-19 DIAGNOSIS — J449 Chronic obstructive pulmonary disease, unspecified: Secondary | ICD-10-CM | POA: Diagnosis not present

## 2018-04-19 DIAGNOSIS — I4891 Unspecified atrial fibrillation: Secondary | ICD-10-CM | POA: Diagnosis not present

## 2018-04-19 DIAGNOSIS — I272 Pulmonary hypertension, unspecified: Secondary | ICD-10-CM | POA: Diagnosis not present

## 2018-04-19 DIAGNOSIS — J479 Bronchiectasis, uncomplicated: Secondary | ICD-10-CM | POA: Diagnosis not present

## 2018-04-20 ENCOUNTER — Other Ambulatory Visit: Payer: Self-pay | Admitting: Cardiovascular Disease

## 2018-04-25 DIAGNOSIS — J479 Bronchiectasis, uncomplicated: Secondary | ICD-10-CM | POA: Diagnosis not present

## 2018-04-25 DIAGNOSIS — A312 Disseminated mycobacterium avium-intracellulare complex (DMAC): Secondary | ICD-10-CM | POA: Diagnosis not present

## 2018-04-25 DIAGNOSIS — J9621 Acute and chronic respiratory failure with hypoxia: Secondary | ICD-10-CM | POA: Diagnosis not present

## 2018-04-25 DIAGNOSIS — J449 Chronic obstructive pulmonary disease, unspecified: Secondary | ICD-10-CM | POA: Diagnosis not present

## 2018-04-25 DIAGNOSIS — I272 Pulmonary hypertension, unspecified: Secondary | ICD-10-CM | POA: Diagnosis not present

## 2018-04-25 DIAGNOSIS — I4891 Unspecified atrial fibrillation: Secondary | ICD-10-CM | POA: Diagnosis not present

## 2018-04-26 ENCOUNTER — Other Ambulatory Visit: Payer: Self-pay | Admitting: Cardiovascular Disease

## 2018-04-26 DIAGNOSIS — I4891 Unspecified atrial fibrillation: Secondary | ICD-10-CM | POA: Diagnosis not present

## 2018-04-26 DIAGNOSIS — J479 Bronchiectasis, uncomplicated: Secondary | ICD-10-CM | POA: Diagnosis not present

## 2018-04-26 DIAGNOSIS — J9621 Acute and chronic respiratory failure with hypoxia: Secondary | ICD-10-CM | POA: Diagnosis not present

## 2018-04-26 DIAGNOSIS — I272 Pulmonary hypertension, unspecified: Secondary | ICD-10-CM | POA: Diagnosis not present

## 2018-04-26 DIAGNOSIS — A312 Disseminated mycobacterium avium-intracellulare complex (DMAC): Secondary | ICD-10-CM | POA: Diagnosis not present

## 2018-04-26 DIAGNOSIS — J449 Chronic obstructive pulmonary disease, unspecified: Secondary | ICD-10-CM | POA: Diagnosis not present

## 2018-05-03 DIAGNOSIS — J449 Chronic obstructive pulmonary disease, unspecified: Secondary | ICD-10-CM | POA: Diagnosis not present

## 2018-05-03 DIAGNOSIS — A312 Disseminated mycobacterium avium-intracellulare complex (DMAC): Secondary | ICD-10-CM | POA: Diagnosis not present

## 2018-05-03 DIAGNOSIS — J9621 Acute and chronic respiratory failure with hypoxia: Secondary | ICD-10-CM | POA: Diagnosis not present

## 2018-05-03 DIAGNOSIS — I4891 Unspecified atrial fibrillation: Secondary | ICD-10-CM | POA: Diagnosis not present

## 2018-05-03 DIAGNOSIS — I272 Pulmonary hypertension, unspecified: Secondary | ICD-10-CM | POA: Diagnosis not present

## 2018-05-03 DIAGNOSIS — J479 Bronchiectasis, uncomplicated: Secondary | ICD-10-CM | POA: Diagnosis not present

## 2018-05-04 ENCOUNTER — Other Ambulatory Visit: Payer: Self-pay | Admitting: Cardiovascular Disease

## 2018-05-08 DIAGNOSIS — Z9981 Dependence on supplemental oxygen: Secondary | ICD-10-CM | POA: Diagnosis not present

## 2018-05-08 DIAGNOSIS — I272 Pulmonary hypertension, unspecified: Secondary | ICD-10-CM | POA: Diagnosis not present

## 2018-05-08 DIAGNOSIS — I4891 Unspecified atrial fibrillation: Secondary | ICD-10-CM | POA: Diagnosis not present

## 2018-05-08 DIAGNOSIS — R634 Abnormal weight loss: Secondary | ICD-10-CM | POA: Diagnosis not present

## 2018-05-08 DIAGNOSIS — E039 Hypothyroidism, unspecified: Secondary | ICD-10-CM | POA: Diagnosis not present

## 2018-05-08 DIAGNOSIS — A312 Disseminated mycobacterium avium-intracellulare complex (DMAC): Secondary | ICD-10-CM | POA: Diagnosis not present

## 2018-05-08 DIAGNOSIS — Z681 Body mass index (BMI) 19 or less, adult: Secondary | ICD-10-CM | POA: Diagnosis not present

## 2018-05-08 DIAGNOSIS — J9621 Acute and chronic respiratory failure with hypoxia: Secondary | ICD-10-CM | POA: Diagnosis not present

## 2018-05-08 DIAGNOSIS — J479 Bronchiectasis, uncomplicated: Secondary | ICD-10-CM | POA: Diagnosis not present

## 2018-05-08 DIAGNOSIS — J449 Chronic obstructive pulmonary disease, unspecified: Secondary | ICD-10-CM | POA: Diagnosis not present

## 2018-05-10 DIAGNOSIS — J479 Bronchiectasis, uncomplicated: Secondary | ICD-10-CM | POA: Diagnosis not present

## 2018-05-10 DIAGNOSIS — A312 Disseminated mycobacterium avium-intracellulare complex (DMAC): Secondary | ICD-10-CM | POA: Diagnosis not present

## 2018-05-10 DIAGNOSIS — J9621 Acute and chronic respiratory failure with hypoxia: Secondary | ICD-10-CM | POA: Diagnosis not present

## 2018-05-10 DIAGNOSIS — I4891 Unspecified atrial fibrillation: Secondary | ICD-10-CM | POA: Diagnosis not present

## 2018-05-10 DIAGNOSIS — I272 Pulmonary hypertension, unspecified: Secondary | ICD-10-CM | POA: Diagnosis not present

## 2018-05-10 DIAGNOSIS — J449 Chronic obstructive pulmonary disease, unspecified: Secondary | ICD-10-CM | POA: Diagnosis not present

## 2018-05-12 ENCOUNTER — Telehealth: Payer: Self-pay | Admitting: Pulmonary Disease

## 2018-05-12 DIAGNOSIS — J449 Chronic obstructive pulmonary disease, unspecified: Secondary | ICD-10-CM | POA: Diagnosis not present

## 2018-05-12 DIAGNOSIS — I4891 Unspecified atrial fibrillation: Secondary | ICD-10-CM | POA: Diagnosis not present

## 2018-05-12 DIAGNOSIS — A312 Disseminated mycobacterium avium-intracellulare complex (DMAC): Secondary | ICD-10-CM | POA: Diagnosis not present

## 2018-05-12 DIAGNOSIS — I272 Pulmonary hypertension, unspecified: Secondary | ICD-10-CM | POA: Diagnosis not present

## 2018-05-12 DIAGNOSIS — J9621 Acute and chronic respiratory failure with hypoxia: Secondary | ICD-10-CM | POA: Diagnosis not present

## 2018-05-12 DIAGNOSIS — J479 Bronchiectasis, uncomplicated: Secondary | ICD-10-CM | POA: Diagnosis not present

## 2018-05-12 NOTE — Telephone Encounter (Signed)
Spoke with Tammy. She stated that the patient is currently on Brovana and Pulmicort. She is can not afford these medications as they cost over $1500 monthly. She has also been on Symbicort and this is costing her $400 each month. Per Tammy, patient was switched over to Symbicort on 03/21/18 by BQ.   She wants to know if the patient can be switched to some type of oral steroid.   BQ, please advise. Thanks!

## 2018-05-12 NOTE — Telephone Encounter (Signed)
I worry that would make her more susceptible to infection Could try changing to Duoneb tid

## 2018-05-12 NOTE — Telephone Encounter (Signed)
Called Tammy but unable to reach her.  Left message for Tammy to return call x1

## 2018-05-15 ENCOUNTER — Other Ambulatory Visit: Payer: Self-pay | Admitting: Internal Medicine

## 2018-05-15 ENCOUNTER — Ambulatory Visit (INDEPENDENT_AMBULATORY_CARE_PROVIDER_SITE_OTHER): Admitting: Internal Medicine

## 2018-05-15 ENCOUNTER — Encounter: Payer: Self-pay | Admitting: Internal Medicine

## 2018-05-15 DIAGNOSIS — I4891 Unspecified atrial fibrillation: Secondary | ICD-10-CM | POA: Diagnosis not present

## 2018-05-15 DIAGNOSIS — J449 Chronic obstructive pulmonary disease, unspecified: Secondary | ICD-10-CM | POA: Diagnosis not present

## 2018-05-15 DIAGNOSIS — J479 Bronchiectasis, uncomplicated: Secondary | ICD-10-CM | POA: Diagnosis not present

## 2018-05-15 DIAGNOSIS — A312 Disseminated mycobacterium avium-intracellulare complex (DMAC): Secondary | ICD-10-CM | POA: Diagnosis not present

## 2018-05-15 DIAGNOSIS — R0602 Shortness of breath: Secondary | ICD-10-CM | POA: Diagnosis not present

## 2018-05-15 DIAGNOSIS — I272 Pulmonary hypertension, unspecified: Secondary | ICD-10-CM | POA: Diagnosis not present

## 2018-05-15 DIAGNOSIS — J9621 Acute and chronic respiratory failure with hypoxia: Secondary | ICD-10-CM | POA: Diagnosis not present

## 2018-05-15 MED ORDER — BUDESONIDE 0.5 MG/2ML IN SUSP: 0.5000 mg | Freq: Two times a day (BID) | RESPIRATORY_TRACT | 11 refills | Status: DC

## 2018-05-15 MED ORDER — PREDNISONE 10 MG PO TABS: ORAL_TABLET | ORAL | 0 refills | Status: DC

## 2018-05-15 MED ORDER — ARFORMOTEROL TARTRATE 15 MCG/2ML IN NEBU
15.0000 ug | INHALATION_SOLUTION | Freq: Two times a day (BID) | RESPIRATORY_TRACT | 11 refills | Status: DC
Start: 2018-05-15 — End: 2018-05-19

## 2018-05-15 MED ORDER — TORSEMIDE 20 MG PO TABS: 20.0000 mg | ORAL_TABLET | Freq: Every day | ORAL | 1 refills | Status: DC

## 2018-05-15 NOTE — Progress Notes (Deleted)
Cardiology Office Note  Date:  05/15/2018   ID:  Hannah Kline, DOB May 14, 1932, MRN [id number]  PCP:  Crecencio Mc, MD   No chief complaint on file.   HPI:   Ms. Hannah Kline is a very pleasant 84year-old woman with a remote history of  Mycobacterium avium with Chronic lung disease/Bronchiectasis diagnosed >17 yr ago, on inhalers, chronic sputum production,  paroxysmal atrial fibrillation,  C. Difficile in 2015 after treatment with Cipro for bronchiectasis Symptoms resolved with Flagyl 2 who presents for followup of her atrial fibrillation.  Last atrial fib with me on her last clinic visit Does not appreciate atrial fibrillation since that time Seen by Dr. Caryl Comes, EP, was normal sinus rhythm at that time Echocardiogram showed normal LV function no left atrial dilatation with moderate to severely elevated right heart pressures greater than 60  Recommended that she start Lasix 20 with potassium 3x per week  On today's visit, no significant improvement in her shortness of breath, still "bad" Having hives couple of weeks, months. On antihistamine with improvement Weight is stable  Case discussed with Dr. Caryl Comes He had recommended changing flecainide to propafenone She has not made this change yet  Significant cough, sputum, using her nebulizer Declined EKG on today's visit  Other past medical history reviewed On her last clinic visit was orthostatic while in atrial fibrillation after taking extra diltiazem  CHADS VASC  reviewed with her, score of 3  Other past medical history Feels herrespiratory status has been slowly getting worse Now on oxygen when she walks, does not feel at she needs this when she is sitting, resting She has a home generator and a portable oxygen generator Sees pulmonary, Dr. Waunita Schooner.  Previous Chest x-ray done which shows stable COPD, pulmonary fibrosis, bronchiectasis.  In the past she took digoxin, extra flecainide for paroxysmal atrial  fibrillation which worked well for her to restore normal sinus rhythm   she has failed sotalol. Amiodarone was discontinued given underlying lung disease. She was started on Flecainide 50 mg twice a day with good success.  Episode of atrial fibrillation on April 27th to the 29th 2014. She came to the office, had an EKG. She took digoxin x2, extra flecainide. This was the longest episode of atrial fibrillation she has had. She is symptomatic in atrial fibrillation. She was also given pradaxa and took his twice a day.   Other Episodes of tachycardia in July 2013, October 2013.    PMH:   has a past medical history of Atrial fibrillation ([place]), Bacterial infection due to Pseudomonas, Bronchiectasis, COPD (chronic obstructive pulmonary disease) (HCC) ( ), Cough, Dysrhythmia, Hepatitis, History of Mycobacterium avium complex infection, Hypothyroidism, Pleurisy (12/08/11), and Shortness of breath dyspnea.  PSH:    Past Surgical History:  Procedure Laterality Date  . CATARACT EXTRACTION W/PHACO Left 01/26/2016   Procedure: CATARACT EXTRACTION PHACO AND INTRAOCULAR LENS PLACEMENT (IOC);  Surgeon: Ronnell Freshwater, MD;  Location: [facility];  Service: Ophthalmology;  Laterality: Left;  . COLONOSCOPY    . SHOULDER ARTHROSCOPY     right    Current Outpatient Medications  Medication Sig Dispense Refill  . albuterol (PROVENTIL HFA;VENTOLIN HFA) 108 (90 Base) MCG/ACT inhaler Inhale 2 puffs into the lungs every 4 (four) hours as needed for wheezing or shortness of breath. 1 Inhaler 2  . arformoterol (BROVANA) 15 MCG/2ML NEBU Take 2 mLs (15 mcg total) by nebulization 2 (two) times daily. 60 mL 11  . aspirin EC 81 MG tablet Take 81 mg  by mouth daily.    . budesonide (PULMICORT) 0.5 MG/2ML nebulizer solution Take 2 mLs (0.5 mg total) by nebulization 2 (two) times daily. 120 mL 11  . CVS SENNA PLUS 8.6-50 MG tablet TAKE 1-2 TABS BY MOUTH TWICE DAILY  3  . diltiazem (CARDIZEM) 30 MG  tablet Take 1 tablet (30 mg total) by mouth 3 (three) times daily as needed (atrial fibrillation). 270 tablet 3  . dorzolamide (TRUSOPT) 2 % ophthalmic solution Place 1 drop into the left eye 2 (two) times daily.  7  . famotidine (PEPCID) 20 MG tablet Take 1 tablet (20 mg total) by mouth 2 (two) times daily. 180 tablet 2  . fexofenadine (ALLEGRA) 180 MG tablet Take 1 tablet (180 mg total) by mouth 4 (four) times daily. Gradual increase 120 tablet 2  . levothyroxine (SYNTHROID, LEVOTHROID) 75 MCG tablet Take 1 tablet (75 mcg total) by mouth daily. 90 tablet 1  . OXYGEN Inhale 2 L into the lungs. [place] - when exercising, walking around, and at night    . predniSONE (DELTASONE) 10 MG tablet Take 1 tablet (10 mg total) by mouth daily with breakfast. (Patient not taking: Reported on 05/15/2018) 30 tablet 1  . predniSONE (DELTASONE) 10 MG tablet 6 tablets daily for 3 days  , then reduce by 1 tablet daily until  Dose is 20 mg 60 tablet 0  . propafenone (RYTHMOL) 150 MG tablet TAKE 1 TABLET BY MOUTH TWICE A DAY 30 tablet 0  . Respiratory Therapy Supplies (FLUTTER) DEVI Use as directed 1 each 0  . torsemide (DEMADEX) 20 MG tablet Take 1 tablet (20 mg total) by mouth daily. As needed for fluid retention 30 tablet 1   No current facility-administered medications for this visit.      Allergies:   Ciprofloxacin; Isoniazid; and Moxifloxacin   Social History:  The patient  reports that she has never smoked. She has never used smokeless tobacco. She reports that she drinks about 4.2 - 4.8 oz of alcohol per week. She reports that she does not use drugs.   Family History:   family history includes Angina in her mother; Diabetes in her maternal grandfather; Other in her mother.    Review of Systems: Review of Systems  Constitutional: Positive for malaise/fatigue.  Respiratory: Positive for cough and shortness of breath.   Gastrointestinal: Negative.   Musculoskeletal: Negative.   Neurological: Positive for  weakness.  Psychiatric/Behavioral: Negative.   All other systems reviewed and are negative.    PHYSICAL EXAM: VS:  There were no vitals taken for this visit. , BMI There is no height or weight on file to calculate BMI.  GEN: thin, no distress  HEENT: normal  Neck: no JVD, carotid bruits, or masses Cardiac, irregularly irregular, no murmurs, rubs, or gallops,no edema  Respiratory:  Rales appreciated on the left, wheezing on the left,  mild increased work of breathing  GI: soft, nontender, nondistended, + BS MS: no deformity or atrophy  Skin: warm and dry, no rash Neuro:  Strength and sensation are intact Psych: euthymic mood, full affect    Recent Labs: 02/13/2018: ALT 12; BUN 17; Creatinine, Ser 0.77; Hemoglobin 12.3; Platelets 452.0; Potassium 4.8; Sodium 137; TSH 4.08    Lipid Panel Lab Results  Component Value Date   CHOL 177 01/22/2016   HDL 53.50 01/22/2016   LDLCALC 108 (H) 01/22/2016   TRIG 78.0 01/22/2016      Wt Readings from Last 3 Encounters:  05/15/18 99 lb 3.2 oz (  45 kg)  02/13/18 107 lb (48.5 kg)  01/31/18 103 lb 1.9 oz (46.8 kg)       ASSESSMENT AND PLAN:   Paroxysmal atrial fibrillation (HCC) - Plan: EKG 12-Lead,  Maintaining normal sinus rhythm We will stop flecainide and start propafenone 150 mill grams twice a day Recommended she take extra diltiazem and digoxin for breakthrough atrial fibrillation  Bronchiectasis with acute exacerbation (HCC) -  Followed by pulmonary, no change on recent CT scan Very coarse breath sounds  Mucopurulent chronic bronchitis (HCC) Recommended if symptoms of cough congestion and sputum get worse that she call pulmonary  Malaise and fatigue - Plan: EKG 12-Lead, Ambulatory referral to Cardiac Electrophysiology Etiology unclear, likely multifactorial from issues above  Pulmonary hypertension Suggested she increase Lasix up to daily with potassium Additional options include starting revatio 20 mg 3 times a day      Total encounter time more than 25 minutes  Greater than 50% was spent in counseling and coordination of care with the patient   Disposition:   F/U  3 month   No orders of the defined types were placed in this encounter.    Signed, Esmond Plants, M.D., Ph.D. 05/15/2018  [place], [place]

## 2018-05-15 NOTE — Patient Instructions (Signed)
Resume prednisone ,  Starting at 60 mg daily or 3 days.  If this helps significantly,  Yo udo not need to resume the nebulized medications.   We will gradually reduce the prednisone dose AS TOLERATED , by 10 mg daily (AFTER 3 DAYS OF PREDNISONE)  until you are taking 20 mg   After one week of 20 mg daily,  If tolerated,  We can reduce to 10 mg daily AND CONTINUE 10 MG DAILY      TORSEMIDE REFILLED

## 2018-05-15 NOTE — Telephone Encounter (Signed)
Spoke with pt, aware of BQ's recs.  Nothing further needed.  

## 2018-05-15 NOTE — Telephone Encounter (Signed)
Spoke with Tammy at [facility], states that pt has been on Duoneb TID since her most recent hospital stay.  Tammy would like additional recs if possible.  BQ please advise.  Thanks!

## 2018-05-15 NOTE — Progress Notes (Signed)
Subjective:  Patient ID: Hannah Kline, female    DOB: [birth year]  Age: 84 y.o. MRN: [id number]  CC: The encounter diagnosis was Shortness of breath.  HPI Hannah Kline presents for 3 month follow up on chronic hypoxia with wheezing and dyspnea  secondary to bronchiectasis, managed previously with supplemental oxygen, daily low dose prednisone and nebulized steroids and bronchodilators.,.  Has been having a more difficult time breathing for the last several days to 1 week ,  Has increased 02 rate to 5 L02  To maintain sats  From 88 to 92%   Nebulized meds were stopped 10 days ago due to cost (patient is under care of  Hospice who refused to pay for them.) She wonders if her increased WOB was a result of stopping the inhaled meds and is willing to resume them if coverage and eligibility issues can  Rectified  .  She denies incrased sputum production     Labile tachycardia:  Managed with short acting cardizem   Urticarial dermatitis:  Managed with Allegra at a total dose of  360 mg daily .   Outpatient Medications Prior to Visit  Medication Sig Dispense Refill  . albuterol (PROVENTIL HFA;VENTOLIN HFA) 108 (90 Base) MCG/ACT inhaler Inhale 2 puffs into the lungs every 4 (four) hours as needed for wheezing or shortness of breath. 1 Inhaler 2  . aspirin EC 81 MG tablet Take 81 mg by mouth daily.    . CVS SENNA PLUS 8.6-50 MG tablet TAKE 1-2 TABS BY MOUTH TWICE DAILY  3  . diltiazem (CARDIZEM) 30 MG tablet Take 1 tablet (30 mg total) by mouth 3 (three) times daily as needed (atrial fibrillation). 270 tablet 3  . dorzolamide (TRUSOPT) 2 % ophthalmic solution Place 1 drop into the left eye 2 (two) times daily.  7  . famotidine (PEPCID) 20 MG tablet Take 1 tablet (20 mg total) by mouth 2 (two) times daily. 180 tablet 2  . fexofenadine (ALLEGRA) 180 MG tablet Take 1 tablet (180 mg total) by mouth 4 (four) times daily. Gradual increase 120 tablet 2  . levothyroxine (SYNTHROID, LEVOTHROID) 75 MCG tablet Take  1 tablet (75 mcg total) by mouth daily. 90 tablet 1  . OXYGEN Inhale 2 L into the lungs. [name] - when exercising, walking around, and at night    . propafenone (RYTHMOL) 150 MG tablet TAKE 1 TABLET BY MOUTH TWICE A DAY 30 tablet 0  . Respiratory Therapy Supplies (FLUTTER) DEVI Use as directed 1 each 0  . torsemide (DEMADEX) 20 MG tablet Take 1 tablet (20 mg total) by mouth daily. As needed for fluid retention 30 tablet 0  . predniSONE (DELTASONE) 10 MG tablet Take 1 tablet (10 mg total) by mouth daily with breakfast. (Patient not taking: Reported on 05/15/2018) 30 tablet 1  . arformoterol (BROVANA) 15 MCG/2ML NEBU Take 2 mLs (15 mcg total) by nebulization 2 (two) times daily. (Patient not taking: Reported on 05/15/2018) 120 mL 11  . budesonide (PULMICORT) 0.5 MG/2ML nebulizer solution Take 2 mLs (0.5 mg total) by nebulization 2 (two) times daily. (Patient not taking: Reported on 05/15/2018) 120 mL 11  . gabapentin (NEURONTIN) 100 MG capsule Take 1 capsule (100 mg total) by mouth 3 (three) times daily. (Patient not taking: Reported on 05/15/2018) 90 capsule 3   No facility-administered medications prior to visit.     Review of Systems;  Patient denies headache, fevers, malaise, unintentional weight loss, skin rash, eye pain, sinus congestion and sinus pain, sore  throat, dysphagia,  hemoptysis , cough, dyspnea, wheezing, chest pain, palpitations, orthopnea, edema, abdominal pain, nausea, melena, diarrhea, constipation, flank pain, dysuria, hematuria, urinary  Frequency, nocturia, numbness, tingling, seizures,  Focal weakness, Loss of consciousness,  Tremor, insomnia, depression, anxiety, and suicidal ideation.      Objective:  BP (!) 118/56 (BP Location: Left Arm, Patient Position: Sitting, Cuff Size: Normal)   Pulse 83   Temp 97.8 F (36.6 C) (Oral)   Resp 18   Ht 5\' 8"  (1.727 m)   Wt 99 lb 3.2 oz (45 kg)   SpO2 92% Comment: 5L O2  BMI 15.08 kg/m   BP Readings from Last 3 Encounters:  05/15/18  (!) 118/56  02/13/18 (!) 98/56  01/31/18 110/70    Wt Readings from Last 3 Encounters:  05/15/18 99 lb 3.2 oz (45 kg)  02/13/18 107 lb (48.5 kg)  01/31/18 103 lb 1.9 oz (46.8 kg)    General appearance: alert, cachectic appear,  Appears older than stated age Ears: normal TM's and external ear canals both ears Throat: lips, mucosa, and tongue normal; teeth and gums normal Neck: no adenopathy, no carotid bruit, supple, symmetrical, trachea midline and thyroid not enlarged, symmetric, no tenderness/mass/nodules Back: kyphotic . ROM normal. No CVA tenderness. Lungs: clear to auscultation bilaterally Heart: regular rate and rhythm, S1, S2 normal, no murmur, click, rub or gallop Abdomen: soft, non-tender; bowel sounds normal; no masses,  no organomegaly Pulses: 2+ and symmetric Skin: Skin color, texture, turgor normal. No rashes or lesions Lymph nodes: Cervical, supraclavicular, and axillary nodes normal.  Lab Results  Component Value Date   HGBA1C 5.8 (H) 11/22/2014    Lab Results  Component Value Date   CREATININE 0.77 02/13/2018   CREATININE 0.79 01/20/2018   CREATININE 0.77 10/01/2017    Lab Results  Component Value Date   WBC 20.6 Repeated and verified X2. (HH) 02/13/2018   HGB 12.3 02/13/2018   HCT 37.8 02/13/2018   PLT 452.0 (H) 02/13/2018   GLUCOSE 137 (H) 02/13/2018   CHOL 177 01/22/2016   TRIG 78.0 01/22/2016   HDL 53.50 01/22/2016   LDLCALC 108 (H) 01/22/2016   ALT 12 02/13/2018   AST 14 02/13/2018   NA 137 02/13/2018   K 4.8 02/13/2018   CL 99 02/13/2018   CREATININE 0.77 02/13/2018   BUN 17 02/13/2018   CO2 32 02/13/2018   TSH 4.08 02/13/2018   INR 1.22 06/14/2016   HGBA1C 5.8 (H) 11/22/2014    Dg Chest 2 View  Result Date: 10/01/2017 CLINICAL DATA:  Shortness of breath. EXAM: CHEST  2 VIEW COMPARISON:  Radiographs of August 30, 2017. FINDINGS: The heart size and mediastinal contours are within normal limits. No pneumothorax or pleural effusion is  noted. Stable reticular and nodular densities are noted throughout both lungs consistent with prior mycobacterium infection. No new opacities are noted. The visualized skeletal structures are unremarkable. IMPRESSION: Stable bilateral lung opacities are noted most consistent with history of prior mycobacterium infection. No significant changes noted compared to prior exam. Electronically Signed   By: Marijo Conception, M.D.   On: 10/01/2017 14:53    Assessment & Plan:   Problem List Items Addressed This Visit    Shortness of breath    The issue of continued hospice eligibility if she resumes Brovana and Pulmicort is not clear.  She is certain that Hospice is not wililng to pay for the meds due to cost but is unclear if resuming the medication jeopardizes her Hospice  eligibility .  Will resume prednisone starting at 60 mg daily x 5 days,  Then gradually reduce, with plans to continue 10 or 20 mg daily if the medication helps.  If she experiences no improvement in the first few days  ,  She will pay for the meds out of pocket if need be.         A total of 25 minutes of face to face time was spent with patient more than half of which was spent in counselling about the above mentioned conditions  and coordination of care  I have discontinued Carolanne Comp's gabapentin and torsemide. I have also changed her predniSONE. Additionally, I am having her maintain her OXYGEN, diltiazem, dorzolamide, FLUTTER, albuterol, famotidine, fexofenadine, aspirin EC, CVS SENNA PLUS, predniSONE, levothyroxine, propafenone, arformoterol, and budesonide.  Meds ordered this encounter  Medications  . predniSONE (DELTASONE) 10 MG tablet    Sig: 6 tablets daily for 3 days  , then reduce by 1 tablet daily until  Dose is 20 mg    Dispense:  60 tablet    Refill:  0  . arformoterol (BROVANA) 15 MCG/2ML NEBU    Sig: Take 2 mLs (15 mcg total) by nebulization 2 (two) times daily.    Dispense:  60 mL    Refill:  11  . budesonide  (PULMICORT) 0.5 MG/2ML nebulizer solution    Sig: Take 2 mLs (0.5 mg total) by nebulization 2 (two) times daily.    Dispense:  120 mL    Refill:  11  . DISCONTD: torsemide (DEMADEX) 20 MG tablet    Sig: Take 1 tablet (20 mg total) by mouth daily. As needed for fluid retention    Dispense:  30 tablet    Refill:  1    Medications Discontinued During This Encounter  Medication Reason  . arformoterol (BROVANA) 15 MCG/2ML NEBU Patient has not taken in last 30 days  . budesonide (PULMICORT) 0.5 MG/2ML nebulizer solution Patient has not taken in last 30 days  . gabapentin (NEURONTIN) 100 MG capsule Patient has not taken in last 30 days  . torsemide (DEMADEX) 20 MG tablet Reorder    Follow-up: Return in about 3 months (around 08/15/2018).   Crecencio Mc, MD

## 2018-05-15 NOTE — Telephone Encounter (Signed)
Hold budesonide and brovana> let me know if any changes

## 2018-05-16 DIAGNOSIS — A312 Disseminated mycobacterium avium-intracellulare complex (DMAC): Secondary | ICD-10-CM | POA: Diagnosis not present

## 2018-05-16 DIAGNOSIS — J449 Chronic obstructive pulmonary disease, unspecified: Secondary | ICD-10-CM | POA: Diagnosis not present

## 2018-05-16 DIAGNOSIS — I272 Pulmonary hypertension, unspecified: Secondary | ICD-10-CM | POA: Diagnosis not present

## 2018-05-16 DIAGNOSIS — I4891 Unspecified atrial fibrillation: Secondary | ICD-10-CM | POA: Diagnosis not present

## 2018-05-16 DIAGNOSIS — J9621 Acute and chronic respiratory failure with hypoxia: Secondary | ICD-10-CM | POA: Diagnosis not present

## 2018-05-16 DIAGNOSIS — J479 Bronchiectasis, uncomplicated: Secondary | ICD-10-CM | POA: Diagnosis not present

## 2018-05-16 NOTE — Assessment & Plan Note (Signed)
The issue of continued hospice eligibility if she resumes Brovana and Pulmicort is not clear.  She is certain that Hospice is not wililng to pay for the meds due to cost but is unclear if resuming the medication jeopardizes her Hospice eligibility .  Will resume prednisone starting at 60 mg daily x 5 days,  Then gradually reduce, with plans to continue 10 or 20 mg daily if the medication helps.  If she experiences no improvement in the first few days  ,  She will pay for the meds out of pocket if need be.

## 2018-05-17 ENCOUNTER — Telehealth: Payer: Self-pay | Admitting: Cardiovascular Disease

## 2018-05-17 ENCOUNTER — Ambulatory Visit: Admitting: Cardiovascular Disease

## 2018-05-17 DIAGNOSIS — I4891 Unspecified atrial fibrillation: Secondary | ICD-10-CM | POA: Diagnosis not present

## 2018-05-17 DIAGNOSIS — I272 Pulmonary hypertension, unspecified: Secondary | ICD-10-CM | POA: Diagnosis not present

## 2018-05-17 DIAGNOSIS — J9621 Acute and chronic respiratory failure with hypoxia: Secondary | ICD-10-CM | POA: Diagnosis not present

## 2018-05-17 DIAGNOSIS — J449 Chronic obstructive pulmonary disease, unspecified: Secondary | ICD-10-CM | POA: Diagnosis not present

## 2018-05-17 DIAGNOSIS — A312 Disseminated mycobacterium avium-intracellulare complex (DMAC): Secondary | ICD-10-CM | POA: Diagnosis not present

## 2018-05-17 DIAGNOSIS — J479 Bronchiectasis, uncomplicated: Secondary | ICD-10-CM | POA: Diagnosis not present

## 2018-05-17 NOTE — Telephone Encounter (Signed)
To Dr. Rockey Situ to review. The patient saw Dr. Derrel Nip on 05/15/18 and she addressed the patient's SOB.

## 2018-05-17 NOTE — Telephone Encounter (Signed)
Pt is calling to see if she still needs to see Dr. Rockey Situ, she states she  Does not need a heart doctor at this time. Pt did sound very SOB on the phone as we were conversing. Pt has an appt this afternoon. Please call to advise.

## 2018-05-17 NOTE — Telephone Encounter (Signed)
Pt did cancel her appointment for today

## 2018-05-19 ENCOUNTER — Ambulatory Visit (INDEPENDENT_AMBULATORY_CARE_PROVIDER_SITE_OTHER): Admitting: Internal Medicine

## 2018-05-19 ENCOUNTER — Other Ambulatory Visit: Payer: Self-pay | Admitting: Cardiovascular Disease

## 2018-05-19 ENCOUNTER — Encounter: Payer: Self-pay | Admitting: Internal Medicine

## 2018-05-19 VITALS — BP 110/58 | HR 101 | Ht 68.0 in | Wt 99.8 lb

## 2018-05-19 DIAGNOSIS — I480 Paroxysmal atrial fibrillation: Secondary | ICD-10-CM

## 2018-05-19 DIAGNOSIS — I272 Pulmonary hypertension, unspecified: Secondary | ICD-10-CM | POA: Diagnosis not present

## 2018-05-19 DIAGNOSIS — Z79899 Other long term (current) drug therapy: Secondary | ICD-10-CM

## 2018-05-19 DIAGNOSIS — I4891 Unspecified atrial fibrillation: Secondary | ICD-10-CM | POA: Diagnosis not present

## 2018-05-19 DIAGNOSIS — J9621 Acute and chronic respiratory failure with hypoxia: Secondary | ICD-10-CM | POA: Diagnosis not present

## 2018-05-19 DIAGNOSIS — A312 Disseminated mycobacterium avium-intracellulare complex (DMAC): Secondary | ICD-10-CM | POA: Diagnosis not present

## 2018-05-19 DIAGNOSIS — J449 Chronic obstructive pulmonary disease, unspecified: Secondary | ICD-10-CM | POA: Diagnosis not present

## 2018-05-19 DIAGNOSIS — J479 Bronchiectasis, uncomplicated: Secondary | ICD-10-CM | POA: Diagnosis not present

## 2018-05-19 MED ORDER — APIXABAN 2.5 MG PO TABS: 2.5000 mg | ORAL_TABLET | Freq: Two times a day (BID) | ORAL | 11 refills | Status: AC

## 2018-05-19 NOTE — Progress Notes (Signed)
Patient Care Team: Crecencio Mc, MD as PCP - General (Internal Medicine) Minna Merritts, MD as Consulting Physician (Cardiology)   HPI  Hannah Kline is a 84 y.o. female Seen in follow-up for paroxysmal atrial fibrillation in the context of severe lung disease with remote MAI and recurring problems with bronchiectasis.  Antiarrhythmics Date  amiodarone stopped/   sotalol failure  flecainide ---  Propafenone         DATE PR interval QRSduration Dose-   11/14  214 106 50  flec  6/18 198 110 75    9/18 192 114 75  7/19 - 118 Propaf 150 bid  \    She notes that she started taking sildenafil a few days ago previously prescribed. (Not on MAR she thinks she may feel a little bit better since she initiated at)      Echocardiogram from 418 was reviewed. LV function was normal PA pressures were elevated in the 60 range.  Her apixoban was stopped by hospice.  Her pulm meds were stopped by hospice  She has felt worse since that was done. She was started on ASA    HR 4/19 was 109  Raising possibility of that was with recurrent atrial flutter  She feels passively cared for by hospice    Records and Results Reviewed ER records inpatient telemetry records;  inpatient electrocardiograms were not available for review  Past Medical History:  Diagnosis Date  . Atrial fibrillation ([place])   . Bacterial infection due to Pseudomonas   . Bronchiectasis   . COPD (chronic obstructive pulmonary disease) ([place])    . Cough    from bronchiectasis neb txs  . Dysrhythmia    A Fib  . Hepatitis    h/o INH(isoniazide)  . History of Mycobacterium avium complex infection    lung disease  . Hypothyroidism   . Pleurisy 12/08/11  . Shortness of breath dyspnea     Past Surgical History:  Procedure Laterality Date  . CATARACT EXTRACTION W/PHACO Left 01/26/2016   Procedure: CATARACT EXTRACTION PHACO AND INTRAOCULAR LENS PLACEMENT (IOC);  Surgeon: Ronnell Freshwater, MD;   Location: [facility];  Service: Ophthalmology;  Laterality: Left;  . COLONOSCOPY    . SHOULDER ARTHROSCOPY     right    Current Outpatient Medications  Medication Sig Dispense Refill  . albuterol (PROVENTIL HFA;VENTOLIN HFA) 108 (90 Base) MCG/ACT inhaler Inhale 2 puffs into the lungs every 4 (four) hours as needed for wheezing or shortness of breath. 1 Inhaler 2  . aspirin EC 81 MG tablet Take 81 mg by mouth daily.    . [employer] SENNA PLUS 8.6-50 MG tablet TAKE 1-2 TABS BY MOUTH TWICE DAILY  3  . diltiazem (CARDIZEM) 30 MG tablet Take 1 tablet (30 mg total) by mouth 3 (three) times daily as needed (atrial fibrillation). 270 tablet 3  . dorzolamide (TRUSOPT) 2 % ophthalmic solution Place 1 drop into the left eye 2 (two) times daily.  7  . famotidine (PEPCID) 20 MG tablet Take 1 tablet (20 mg total) by mouth 2 (two) times daily. 180 tablet 2  . fexofenadine (ALLEGRA) 180 MG tablet Take 1 tablet (180 mg total) by mouth 4 (four) times daily. Gradual increase 120 tablet 2  . levothyroxine (SYNTHROID, LEVOTHROID) 75 MCG tablet Take 1 tablet (75 mcg total) by mouth daily. 90 tablet 1  . OXYGEN Inhale 2 L into the lungs. [place] - when exercising, walking around, and at night    .  predniSONE (DELTASONE) 10 MG tablet Take 1 tablet (10 mg total) by mouth daily with breakfast. 30 tablet 1  . propafenone (RYTHMOL) 150 MG tablet TAKE 1 TABLET BY MOUTH TWICE A DAY 30 tablet 0  . Respiratory Therapy Supplies (FLUTTER) DEVI Use as directed 1 each 0  . torsemide (DEMADEX) 20 MG tablet TAKE 1 TABLET (20 MG TOTAL) BY MOUTH DAILY. AS NEEDED FOR FLUID RETENTION 90 tablet 0   No current facility-administered medications for this visit.     Allergies  Allergen Reactions  . Ciprofloxacin Other (See Comments)    c-diff  . Isoniazid     Hepatitis, weakness, nausea  . Moxifloxacin     Avelox  REACTION: chills, fainting      Review of Systems negative except from HPI and PMH  Physical Exam BP (!) 110/58  (BP Location: Left Arm, Patient Position: Sitting, Cuff Size: Normal)   Pulse (!) 101   Ht _0  (1.727 m)   Wt 99 lb 12 oz (45.2 kg)   BMI 15.17 kg/m  Well developed and nourished in no acute distress HENT normal Neck supple with JVP-flat Diffuse crackles  Regular rate and rhythm, no murmurs or gallops Abd-soft with active BS No Clubbing cyanosis edema Skin-warm and dry A & Oriented  Grossly normal sensory and motor function   ECG demonstrates atrial flutter atypical 101 -/12/39  Assessment and  Plan  Atrial fibrillation-flutter  persistent  Bronchiectasis-oxygen dependent COPD  Pulm HTN  End of LIfe discussion   Hospice care   We had a long discussion regarding the potential contribution of her atrial flutter to her deteriorating pulmonary status.  I have recommended cardioversion.  Unfortunately, her apixaban was discontinued, apparently by hospice.  We will discontinue her aspirin and resume her apixaban.  I will also reach out to Dr. Lake Bells, her hospice doctor.  It is her impression that the medications that were discontinued--budosonide and brovana.  Resulted in her situation worsening.  End of life discussions as well as related to the role of hospice in her care.  I reiterated to her that hospice is of her choosing not of their dictating.  I further mentioned that she has a right to choose to come off of hospice.  And I also affirmed for her that whether she is on hospice or not as physicians it is our privilege and responsibility to continue to care for her all the days of her life  More than 50% of 45 min was spent in counseling related to the above

## 2018-05-19 NOTE — Patient Instructions (Addendum)
Medication Instructions: - Your physician has recommended you make the following change in your medication:   1) STOP Aspirin 2) START Eliquis 2.5 mg- take 1 tablet by mouth TWICE daily  Labwork: - Your physician recommends that you return for lab work in: 2 weeks- BMP/ CBC  Procedures/Testing: - Your physician has recommended that you have a Cardioversion (DCCV). Electrical Cardioversion uses a jolt of electricity to your heart either through paddles or wired patches attached to your chest. This is a controlled, usually prescheduled, procedure. Defibrillation is done under light anesthesia in the hospital, and you usually go home the day of the procedure. This is done to get your heart back into a normal rhythm. You are not awake for the procedure.   You are scheduled for a Cardioversion on Monday 06/12/18 with Dr. Fletcher Anon. Please arrive at the [facility] at 6:30 am on the day of your procedure.  DIET INSTRUCTIONS:  Nothing to eat or drink after midnight the night prior.         1) Labs: in 2 weeks  2) Medications:  YOU MAY TAKE ALL of your medications with a small amount of water the morning of your procedure unless listed below:  - HOLD torsemide the morning of your procedure  3) Must have a responsible person to drive you home.  4) Bring a current list of your medications and current insurance cards.    If you have any questions after you get home, please call the office at [phone]   Follow-Up: - Your physician recommends that you schedule a follow-up appointment in: 6-8 weeks with Dr. Caryl Comes.   Any Additional Special Instructions Will Be Listed Below (If Applicable).     If you need a refill on your cardiac medications before your next appointment, please call your pharmacy.

## 2018-05-20 DIAGNOSIS — J449 Chronic obstructive pulmonary disease, unspecified: Secondary | ICD-10-CM | POA: Diagnosis not present

## 2018-05-20 DIAGNOSIS — J479 Bronchiectasis, uncomplicated: Secondary | ICD-10-CM | POA: Diagnosis not present

## 2018-05-20 DIAGNOSIS — A312 Disseminated mycobacterium avium-intracellulare complex (DMAC): Secondary | ICD-10-CM | POA: Diagnosis not present

## 2018-05-20 DIAGNOSIS — I4891 Unspecified atrial fibrillation: Secondary | ICD-10-CM | POA: Diagnosis not present

## 2018-05-20 DIAGNOSIS — J9621 Acute and chronic respiratory failure with hypoxia: Secondary | ICD-10-CM | POA: Diagnosis not present

## 2018-05-20 DIAGNOSIS — I272 Pulmonary hypertension, unspecified: Secondary | ICD-10-CM | POA: Diagnosis not present

## 2018-05-22 ENCOUNTER — Telehealth: Payer: Self-pay | Admitting: Internal Medicine

## 2018-05-22 ENCOUNTER — Telehealth: Payer: Self-pay

## 2018-05-22 DIAGNOSIS — J449 Chronic obstructive pulmonary disease, unspecified: Secondary | ICD-10-CM | POA: Diagnosis not present

## 2018-05-22 DIAGNOSIS — I272 Pulmonary hypertension, unspecified: Secondary | ICD-10-CM | POA: Diagnosis not present

## 2018-05-22 DIAGNOSIS — A312 Disseminated mycobacterium avium-intracellulare complex (DMAC): Secondary | ICD-10-CM | POA: Diagnosis not present

## 2018-05-22 DIAGNOSIS — J479 Bronchiectasis, uncomplicated: Secondary | ICD-10-CM | POA: Diagnosis not present

## 2018-05-22 DIAGNOSIS — J9621 Acute and chronic respiratory failure with hypoxia: Secondary | ICD-10-CM | POA: Diagnosis not present

## 2018-05-22 DIAGNOSIS — I4891 Unspecified atrial fibrillation: Secondary | ICD-10-CM | POA: Diagnosis not present

## 2018-05-22 NOTE — Telephone Encounter (Signed)
I called and spoke with the patient's husband and advised that I was uncertain of the actual conversation between Dr. Caryl Comes and Dr. Lake Bells, but I did see that someone from Dr. Anastasia Pall office had tried to reach out to them to schedule a follow up per Dr. Anastasia Pall request.  Per Mr. Lesmeister, they have spoken with someone from Dr. Anastasia Pall office and have a follow up appointment with him tomorrow.  Mr. Baldonado was also concerned about the patient being in atrial fibrillation and felt she was not tolerating this very well.  He asked if the DCCV could be moved up. I advised that the patient will need 3 weeks of uninterrupted Eliquis prior to the DCCV being done. They are aware that Dr. Caryl Comes was trying to avoid having the patient undergo a TEE. Mr. Deupree voices understanding.

## 2018-05-22 NOTE — Telephone Encounter (Signed)
Patient has appointment 05/23/2018 at 10:45 AM

## 2018-05-22 NOTE — Telephone Encounter (Signed)
Attempted to call patient to schedule an OV, no answer, left message to call back.

## 2018-05-22 NOTE — Telephone Encounter (Signed)
Patient spouse calling to check on result of conversation between [place] and mcquaid.  He was not aware someone from pulm called to schedule.  He was given contact for [facility] pulmonology and advised to discuss with Bettie RN per phone note.

## 2018-05-22 NOTE — Telephone Encounter (Signed)
-----   Message from Juanito Doom, MD sent at 05/22/2018 10:31 AM EDT ----- Hi,  Needs OV with me soon to discuss hospice.  Ruby Cola

## 2018-05-23 ENCOUNTER — Ambulatory Visit (INDEPENDENT_AMBULATORY_CARE_PROVIDER_SITE_OTHER): Payer: Medicare Other | Admitting: Pulmonary Disease

## 2018-05-23 ENCOUNTER — Encounter: Payer: Self-pay | Admitting: Pulmonary Disease

## 2018-05-23 VITALS — BP 108/62 | HR 99 | Ht 68.0 in | Wt 100.0 lb

## 2018-05-23 DIAGNOSIS — R0902 Hypoxemia: Secondary | ICD-10-CM | POA: Diagnosis not present

## 2018-05-23 DIAGNOSIS — J479 Bronchiectasis, uncomplicated: Secondary | ICD-10-CM | POA: Diagnosis not present

## 2018-05-23 DIAGNOSIS — J449 Chronic obstructive pulmonary disease, unspecified: Secondary | ICD-10-CM | POA: Diagnosis not present

## 2018-05-23 DIAGNOSIS — A312 Disseminated mycobacterium avium-intracellulare complex (DMAC): Secondary | ICD-10-CM | POA: Diagnosis not present

## 2018-05-23 DIAGNOSIS — R0602 Shortness of breath: Secondary | ICD-10-CM

## 2018-05-23 DIAGNOSIS — I272 Pulmonary hypertension, unspecified: Secondary | ICD-10-CM | POA: Diagnosis not present

## 2018-05-23 DIAGNOSIS — J9621 Acute and chronic respiratory failure with hypoxia: Secondary | ICD-10-CM | POA: Diagnosis not present

## 2018-05-23 DIAGNOSIS — I4891 Unspecified atrial fibrillation: Secondary | ICD-10-CM

## 2018-05-23 DIAGNOSIS — Z9981 Dependence on supplemental oxygen: Secondary | ICD-10-CM

## 2018-05-23 NOTE — Progress Notes (Signed)
Subjective:    Patient ID: Hannah Kline, female    DOB: 1931-12-27, 84 y.o.   MRN: [id number] Synopsis: Hannah Kline established care with the [facility] pulmonary office in February 2013 for bronchiectasis due to [place]. She was diagnosed at age 62 in [place]. She was treated with rifampin ethambutol and clarithromycin for 2 years. Since then she does not believe she was ever grown out MAI.  She had c.diff colitis in 2015 which was refractory to treatment.    HPI  Chief Complaint  Patient presents with  . Follow-up   Hannah Kline says that she feels "terrible".  She says that it has been more of a problem for her for the past 3 to 4 days.  She denies wheezing, chest tightness or increased mucus production.  She still produces some mucus but is not more than normal.  She does not feel like she is coming down with a cold.  She just feels generally short of breath and more weak.  She says her heart rate has been much higher.  Of note, she had medications changed under hospice recently and she stopped taking Brovana and Pulmicort and started using DuoNeb frequently.  She was given a prescription for prednisone last week.  She learned today that her hospice agency will be able to provide Brovana and Pulmicort again and she will be able to take Eliquis until she has a cardioversion planned by her Hannah Kline.  Past Medical History:  Diagnosis Date  . Atrial fibrillation ([place])   . Bacterial infection due to Pseudomonas   . Bronchiectasis   . COPD (chronic obstructive pulmonary disease) ([place])    . Cough    from bronchiectasis neb txs  . Dysrhythmia    A Fib  . Hepatitis    h/o INH(isoniazide)  . History of Mycobacterium avium complex infection    lung disease  . Hypothyroidism   . Pleurisy 12/08/11  . Shortness of breath dyspnea      Review of Systems  Constitutional: Negative for appetite change, chills, fatigue, fever and unexpected weight change.  HENT: Negative for  congestion, ear pain and nosebleeds.   Respiratory: Negative for cough, choking and shortness of breath.   Cardiovascular: Negative for chest pain and leg swelling.       Objective:   Physical Exam Vitals:   05/23/18 1051 05/23/18 1057  BP: 108/62   Pulse: 99   SpO2: 96% 96%  Weight: 100 lb (45.4 kg)   Height: _0  (1.727 m)    2L  Gen: frail, chronically ill appearing HENT: OP clear, TM's clear, neck supple PULM: Some wheezing, normal air movement B, normal percussion CV: Tachy but regular, no mgr, trace edema GI: BS+, soft, nontender Derm: no cyanosis or rash Psyche: normal mood and affect   Case discussed with Hannah Kline regarding her atrial fibrillation and overall symptom burden   Micro Review of 2012 sputum microbiology: Pan sensitive pseudomonas  10/11/2012 Sputum: PSEUDOMONAS AERUGINOSA       Antibiotic  Sensitivity  Microscan  Status    CEFEPIME  Sensitive  2  Final    CEFTAZIDIME  Sensitive  <=1  Final    CIPROFLOXACIN  Intermediate  2  Final    GENTAMICIN  Sensitive  <=1  Final    IMIPENEM  Sensitive  1  Final    LEVOFLOXACIN  Intermediate  4  Final    PIP/TAZO  Sensitive  <=4  Final    TOBRAMYCIN  Sensitive  <=  1  Final    January 2014 Sputum AFB > neg x3   2015 Sputum> Pseudomonas , Cipro/Levofloxacin Intermediate suscept; Cefepime,Ceftaz, Imi, Pip-Tazo, Gent all susceptible  September 2015 sputum culture> Klebsiella resistant to amoxicillin but susceptible to Augmentin and multiple other agents  January - February 2016 Sputum AFB negative x 3  12/2015 AFB sputum > MAI 03/2016 AFB sputum > negative 05/2017 Sputum culture > Pseudomonas, pan sensitive  PFT 09/2010 Full PFT: Ratio 63%, FEV1 1.49L (67% pred) December 2013 simple spirometry FEV1 0.8 L January 2014 simple spirometry >> not reproducible by ATS standards 11/2013 FEV1 0.93 L April 2018 simple spirometry ratio 61%, FEV1 0.67 L 30% predicted, FVC 1.0 936% predicted  Imaging 10/2011 CT  Chest: impressive upper lobe cystic bronchiectasis, scattered lower lobe tree-in-bud abnormalities and scattered nodules; One RML solid nodule measures 7.10m  January 2013 CT chest [facility]: Stable upper lobe cystic bronchiectasis also present in the right middle lobe. Scattered tree in bud abnormalities and nodules throughout all lung fields roughly unchanged from prior. Right middle lobe nodule is no longer visible but there is an 839mnodule in the right upper lobe.  June 2018 high-resolution CT scan of the chest images independently reviewed showing tree in bud abnormalities and cylindrical bronchiectasis in the upper lobes with some nodules noted, findings worse in the right middle lobe and the lingula with subsegmental atelectasis in the right upper lobe. There is a nodule in the right base which is not changed. Overall impression is consistent with bronchiectasis and findings worrisome for an atypical lung infection like Mycobacterium avium intercellular.     Assessment & Plan:  BRONCHIECTASIS  Hypoxemia requiring supplemental oxygen  Atrial fibrillation, unspecified type (HCC)  Shortness of breath  Discussion: [name] lost a little weight and remain symptomatic but today she does not have signs of an exacerbation.  Given her goals of care to minimize dyspnea I think it is very reasonable for her to continue on hospice and she is content to continue with this as well after we had a conversation frankly about it with her husband and daughter today.  That all being said, I think that we could help relieve symptoms if we can get her back on Brovana and Pulmicort.  I think that changing to albuterol on an as-needed basis may help with her high heart rate.  We also need to try to taper her off of prednisone.  In the event of dyspnea I have encouraged her to use morphine alone rather than the combination of morphine and alprazolam as this seemed to cause her to be too sleepy.  Plan: Prednisone  use: Once you start taking Brovana and Pulmicort I would like for you to take 10 mg on day 1, 5 mg on day 2, 10 mg on day 3, and then 5 mg daily on days 4 5 and 6 then stop  Bronchiectasis: Take Brovana twice a day Take Pulmicort twice a day When she start taking Brovana and Pulmicort use the albuterol-ipratropium only as needed for chest tightness wheezing or shortness of breath, not on a regular basis Call me if you have increased chest congestion mucus production or shortness of breath  Chronic respiratory failure with hypoxemia: Keep using 4 to 5 L as you are doing  Atrial fibrillation: Resume Eliquis Follow-up with Dr. KlCaryl Comesor cardioversion  Shortness of breath: I think it is best for you to just use morphine rather than morphine and alprazolam when you feel short of breath  Follow-up with me in 6 to 8 weeks or sooner if needed      Current Outpatient Medications:  .  albuterol (PROVENTIL HFA;VENTOLIN HFA) 108 (90 Base) MCG/ACT inhaler, Inhale 2 puffs into the lungs every 4 (four) hours as needed for wheezing or shortness of breath., Disp: 1 Inhaler, Rfl: 2 .  apixaban (ELIQUIS) 2.5 MG TABS tablet, Take 1 tablet (2.5 mg total) by mouth 2 (two) times daily., Disp: 60 tablet, Rfl: 11 .  [employer] SENNA PLUS 8.6-50 MG tablet, TAKE 1-2 TABS BY MOUTH TWICE DAILY, Disp: , Rfl: 3 .  diltiazem (CARDIZEM) 30 MG tablet, Take 1 tablet (30 mg total) by mouth 3 (three) times daily as needed (atrial fibrillation)., Disp: 270 tablet, Rfl: 3 .  dorzolamide (TRUSOPT) 2 % ophthalmic solution, Place 1 drop into the left eye 2 (two) times daily., Disp: , Rfl: 7 .  famotidine (PEPCID) 20 MG tablet, Take 1 tablet (20 mg total) by mouth 2 (two) times daily., Disp: 180 tablet, Rfl: 2 .  fexofenadine (ALLEGRA) 180 MG tablet, Take 1 tablet (180 mg total) by mouth 4 (four) times daily. Gradual increase, Disp: 120 tablet, Rfl: 2 .  levothyroxine (SYNTHROID, LEVOTHROID) 75 MCG tablet, Take 1 tablet (75 mcg  total) by mouth daily., Disp: 90 tablet, Rfl: 1 .  OXYGEN, Inhale 2 L into the lungs. [place] - when exercising, walking around, and at night, Disp: , Rfl:  .  predniSONE (DELTASONE) 10 MG tablet, Take 1 tablet (10 mg total) by mouth daily with breakfast., Disp: 30 tablet, Rfl: 1 .  propafenone (RYTHMOL) 150 MG tablet, TAKE 1 TABLET BY MOUTH TWICE A DAY, Disp: 30 tablet, Rfl: 0 .  propafenone (RYTHMOL) 150 MG tablet, TAKE 1 TABLET BY MOUTH TWICE A DAY, Disp: 30 tablet, Rfl: 3 .  Respiratory Therapy Supplies (FLUTTER) DEVI, Use as directed, Disp: 1 each, Rfl: 0 .  torsemide (DEMADEX) 20 MG tablet, TAKE 1 TABLET (20 MG TOTAL) BY MOUTH DAILY. AS NEEDED FOR FLUID RETENTION, Disp: 90 tablet, Rfl: 0

## 2018-05-23 NOTE — Patient Instructions (Signed)
Prednisone use: Once you start taking Brovana and Pulmicort I would like for you to take 10 mg on day 1, 5 mg on day 2, 10 mg on day 3, and then 5 mg daily on days 4 5 and 6 then stop  Bronchiectasis: Take Brovana twice a day Take Pulmicort twice a day When she start taking Brovana and Pulmicort use the albuterol-ipratropium only as needed for chest tightness wheezing or shortness of breath, not on a regular basis Call me if you have increased chest congestion mucus production or shortness of breath  Chronic respiratory failure with hypoxemia: Keep using 4 to 5 L as you are doing  Atrial fibrillation: Resume Eliquis Follow-up with Dr. Caryl Comes for cardioversion  Shortness of breath: I think it is best for you to just use morphine rather than morphine and alprazolam when you feel short of breath  Follow-up with me in 6 to 8 weeks or sooner if needed

## 2018-05-24 DIAGNOSIS — J449 Chronic obstructive pulmonary disease, unspecified: Secondary | ICD-10-CM | POA: Diagnosis not present

## 2018-05-24 DIAGNOSIS — A312 Disseminated mycobacterium avium-intracellulare complex (DMAC): Secondary | ICD-10-CM | POA: Diagnosis not present

## 2018-05-24 DIAGNOSIS — I4891 Unspecified atrial fibrillation: Secondary | ICD-10-CM | POA: Diagnosis not present

## 2018-05-24 DIAGNOSIS — J479 Bronchiectasis, uncomplicated: Secondary | ICD-10-CM | POA: Diagnosis not present

## 2018-05-24 DIAGNOSIS — I272 Pulmonary hypertension, unspecified: Secondary | ICD-10-CM | POA: Diagnosis not present

## 2018-05-24 DIAGNOSIS — J9621 Acute and chronic respiratory failure with hypoxia: Secondary | ICD-10-CM | POA: Diagnosis not present

## 2018-05-30 ENCOUNTER — Other Ambulatory Visit: Payer: Self-pay | Admitting: Internal Medicine

## 2018-05-31 DIAGNOSIS — J9621 Acute and chronic respiratory failure with hypoxia: Secondary | ICD-10-CM | POA: Diagnosis not present

## 2018-05-31 DIAGNOSIS — I4891 Unspecified atrial fibrillation: Secondary | ICD-10-CM | POA: Diagnosis not present

## 2018-05-31 DIAGNOSIS — J479 Bronchiectasis, uncomplicated: Secondary | ICD-10-CM | POA: Diagnosis not present

## 2018-05-31 DIAGNOSIS — A312 Disseminated mycobacterium avium-intracellulare complex (DMAC): Secondary | ICD-10-CM | POA: Diagnosis not present

## 2018-05-31 DIAGNOSIS — J449 Chronic obstructive pulmonary disease, unspecified: Secondary | ICD-10-CM | POA: Diagnosis not present

## 2018-05-31 DIAGNOSIS — I272 Pulmonary hypertension, unspecified: Secondary | ICD-10-CM | POA: Diagnosis not present

## 2018-06-01 DIAGNOSIS — J479 Bronchiectasis, uncomplicated: Secondary | ICD-10-CM | POA: Diagnosis not present

## 2018-06-01 DIAGNOSIS — J449 Chronic obstructive pulmonary disease, unspecified: Secondary | ICD-10-CM | POA: Diagnosis not present

## 2018-06-01 DIAGNOSIS — I4891 Unspecified atrial fibrillation: Secondary | ICD-10-CM | POA: Diagnosis not present

## 2018-06-01 DIAGNOSIS — A312 Disseminated mycobacterium avium-intracellulare complex (DMAC): Secondary | ICD-10-CM | POA: Diagnosis not present

## 2018-06-01 DIAGNOSIS — I272 Pulmonary hypertension, unspecified: Secondary | ICD-10-CM | POA: Diagnosis not present

## 2018-06-01 DIAGNOSIS — J9621 Acute and chronic respiratory failure with hypoxia: Secondary | ICD-10-CM | POA: Diagnosis not present

## 2018-06-05 ENCOUNTER — Other Ambulatory Visit (INDEPENDENT_AMBULATORY_CARE_PROVIDER_SITE_OTHER): Payer: Medicare Other

## 2018-06-05 DIAGNOSIS — Z79899 Other long term (current) drug therapy: Secondary | ICD-10-CM

## 2018-06-05 DIAGNOSIS — I480 Paroxysmal atrial fibrillation: Secondary | ICD-10-CM

## 2018-06-06 DIAGNOSIS — J449 Chronic obstructive pulmonary disease, unspecified: Secondary | ICD-10-CM | POA: Diagnosis not present

## 2018-06-06 DIAGNOSIS — J9621 Acute and chronic respiratory failure with hypoxia: Secondary | ICD-10-CM | POA: Diagnosis not present

## 2018-06-06 DIAGNOSIS — I4891 Unspecified atrial fibrillation: Secondary | ICD-10-CM | POA: Diagnosis not present

## 2018-06-06 DIAGNOSIS — I272 Pulmonary hypertension, unspecified: Secondary | ICD-10-CM | POA: Diagnosis not present

## 2018-06-06 DIAGNOSIS — A312 Disseminated mycobacterium avium-intracellulare complex (DMAC): Secondary | ICD-10-CM | POA: Diagnosis not present

## 2018-06-06 DIAGNOSIS — J479 Bronchiectasis, uncomplicated: Secondary | ICD-10-CM | POA: Diagnosis not present

## 2018-06-06 LAB — CBC WITH DIFFERENTIAL/PLATELET
Basophils Absolute: 0.1 10*3/uL (ref 0.0–0.2)
Basos: 1 %
EOS (ABSOLUTE): 0.7 10*3/uL — ABNORMAL HIGH (ref 0.0–0.4)
Eos: 5 %
HEMOGLOBIN: 11.2 g/dL (ref 11.1–15.9)
Hematocrit: 34.4 % (ref 34.0–46.6)
IMMATURE GRANS (ABS): 0 10*3/uL (ref 0.0–0.1)
Immature Granulocytes: 0 %
LYMPHS: 7 %
Lymphocytes Absolute: 0.9 10*3/uL (ref 0.7–3.1)
MCH: 30.7 pg (ref 26.6–33.0)
MCHC: 32.6 g/dL (ref 31.5–35.7)
MCV: 94 fL (ref 79–97)
MONOCYTES: 7 %
Monocytes Absolute: 0.9 10*3/uL (ref 0.1–0.9)
NEUTROS PCT: 80 %
Neutrophils Absolute: 9.5 10*3/uL — ABNORMAL HIGH (ref 1.4–7.0)
PLATELETS: 285 10*3/uL (ref 150–450)
RBC: 3.65 x10E6/uL — AB (ref 3.77–5.28)
RDW: 12.5 % (ref 12.3–15.4)
WBC: 12.1 10*3/uL — AB (ref 3.4–10.8)

## 2018-06-06 LAB — BASIC METABOLIC PANEL
BUN/Creatinine Ratio: 14 (ref 12–28)
BUN: 11 mg/dL (ref 8–27)
CHLORIDE: 93 mmol/L — AB (ref 96–106)
CO2: 34 mmol/L — AB (ref 20–29)
CREATININE: 0.8 mg/dL (ref 0.57–1.00)
Calcium: 8.4 mg/dL — ABNORMAL LOW (ref 8.7–10.3)
GFR calc Af Amer: 79 mL/min/{1.73_m2} (ref >59)
GFR calc non Af Amer: 68 mL/min/{1.73_m2} (ref >59)
Glucose: 105 mg/dL — ABNORMAL HIGH (ref 65–99)
Potassium: 4.3 mmol/L (ref 3.5–5.2)
SODIUM: 139 mmol/L (ref 134–144)

## 2018-06-07 ENCOUNTER — Encounter: Payer: Self-pay | Admitting: Emergency Medicine

## 2018-06-07 ENCOUNTER — Other Ambulatory Visit: Payer: Self-pay

## 2018-06-07 ENCOUNTER — Telehealth: Payer: Self-pay | Admitting: Pulmonary Disease

## 2018-06-07 ENCOUNTER — Telehealth: Payer: Self-pay | Admitting: Internal Medicine

## 2018-06-07 ENCOUNTER — Emergency Department

## 2018-06-07 ENCOUNTER — Inpatient Hospital Stay
Admission: EM | Admit: 2018-06-07 | Discharge: 2018-06-12 | DRG: 291 | Disposition: A | Discharge status: Home / Self Care | Attending: Internal Medicine | Admitting: Internal Medicine

## 2018-06-07 ENCOUNTER — Telehealth: Payer: Self-pay | Admitting: Emergency Medicine

## 2018-06-07 DIAGNOSIS — R0902 Hypoxemia: Secondary | ICD-10-CM

## 2018-06-07 DIAGNOSIS — R040 Epistaxis: Secondary | ICD-10-CM | POA: Diagnosis not present

## 2018-06-07 DIAGNOSIS — I272 Pulmonary hypertension, unspecified: Secondary | ICD-10-CM | POA: Diagnosis not present

## 2018-06-07 DIAGNOSIS — J9622 Acute and chronic respiratory failure with hypercapnia: Secondary | ICD-10-CM | POA: Diagnosis present

## 2018-06-07 DIAGNOSIS — I484 Atypical atrial flutter: Secondary | ICD-10-CM | POA: Diagnosis not present

## 2018-06-07 DIAGNOSIS — I5043 Acute on chronic combined systolic (congestive) and diastolic (congestive) heart failure: Secondary | ICD-10-CM | POA: Diagnosis not present

## 2018-06-07 DIAGNOSIS — E039 Hypothyroidism, unspecified: Secondary | ICD-10-CM | POA: Diagnosis present

## 2018-06-07 DIAGNOSIS — I5033 Acute on chronic diastolic (congestive) heart failure: Secondary | ICD-10-CM | POA: Insufficient documentation

## 2018-06-07 DIAGNOSIS — K5900 Constipation, unspecified: Secondary | ICD-10-CM | POA: Diagnosis present

## 2018-06-07 DIAGNOSIS — Z7901 Long term (current) use of anticoagulants: Secondary | ICD-10-CM

## 2018-06-07 DIAGNOSIS — Z9981 Dependence on supplemental oxygen: Secondary | ICD-10-CM

## 2018-06-07 DIAGNOSIS — Z7951 Long term (current) use of inhaled steroids: Secondary | ICD-10-CM

## 2018-06-07 DIAGNOSIS — J471 Bronchiectasis with (acute) exacerbation: Secondary | ICD-10-CM | POA: Diagnosis present

## 2018-06-07 DIAGNOSIS — Z881 Allergy status to other antibiotic agents status: Secondary | ICD-10-CM

## 2018-06-07 DIAGNOSIS — I4891 Unspecified atrial fibrillation: Secondary | ICD-10-CM | POA: Diagnosis not present

## 2018-06-07 DIAGNOSIS — I5023 Acute on chronic systolic (congestive) heart failure: Secondary | ICD-10-CM

## 2018-06-07 DIAGNOSIS — R042 Hemoptysis: Secondary | ICD-10-CM | POA: Diagnosis present

## 2018-06-07 DIAGNOSIS — Z888 Allergy status to other drugs, medicaments and biological substances status: Secondary | ICD-10-CM

## 2018-06-07 DIAGNOSIS — J9621 Acute and chronic respiratory failure with hypoxia: Secondary | ICD-10-CM | POA: Diagnosis present

## 2018-06-07 DIAGNOSIS — Z66 Do not resuscitate: Secondary | ICD-10-CM | POA: Diagnosis present

## 2018-06-07 DIAGNOSIS — E43 Unspecified severe protein-calorie malnutrition: Secondary | ICD-10-CM | POA: Diagnosis present

## 2018-06-07 DIAGNOSIS — J449 Chronic obstructive pulmonary disease, unspecified: Secondary | ICD-10-CM | POA: Diagnosis not present

## 2018-06-07 DIAGNOSIS — Z681 Body mass index (BMI) 19 or less, adult: Secondary | ICD-10-CM

## 2018-06-07 DIAGNOSIS — I480 Paroxysmal atrial fibrillation: Secondary | ICD-10-CM | POA: Diagnosis present

## 2018-06-07 DIAGNOSIS — A312 Disseminated mycobacterium avium-intracellulare complex (DMAC): Secondary | ICD-10-CM | POA: Diagnosis not present

## 2018-06-07 DIAGNOSIS — J8410 Pulmonary fibrosis, unspecified: Secondary | ICD-10-CM | POA: Diagnosis present

## 2018-06-07 DIAGNOSIS — J479 Bronchiectasis, uncomplicated: Secondary | ICD-10-CM | POA: Diagnosis not present

## 2018-06-07 DIAGNOSIS — J9610 Chronic respiratory failure, unspecified whether with hypoxia or hypercapnia: Secondary | ICD-10-CM | POA: Diagnosis present

## 2018-06-07 DIAGNOSIS — Z79890 Hormone replacement therapy (postmenopausal): Secondary | ICD-10-CM

## 2018-06-07 DIAGNOSIS — Z8619 Personal history of other infectious and parasitic diseases: Secondary | ICD-10-CM

## 2018-06-07 DIAGNOSIS — R0600 Dyspnea, unspecified: Secondary | ICD-10-CM | POA: Diagnosis not present

## 2018-06-07 DIAGNOSIS — R0602 Shortness of breath: Secondary | ICD-10-CM | POA: Diagnosis present

## 2018-06-07 LAB — BASIC METABOLIC PANEL
Anion gap: 6 (ref 5–15)
BUN: 14 mg/dL (ref 8–23)
CALCIUM: 8.6 mg/dL — AB (ref 8.9–10.3)
CHLORIDE: 94 mmol/L — AB (ref 98–111)
CO2: 37 mmol/L — ABNORMAL HIGH (ref 22–32)
CREATININE: 0.64 mg/dL (ref 0.44–1.00)
GFR calc Af Amer: >60 mL/min (ref >60)
GLUCOSE: 149 mg/dL — AB (ref 70–99)
POTASSIUM: 3.6 mmol/L (ref 3.5–5.1)
Sodium: 137 mmol/L (ref 135–145)

## 2018-06-07 LAB — CBC WITH DIFFERENTIAL/PLATELET
Basophils Absolute: 0.1 10*3/uL (ref 0–0.1)
Basophils Relative: 0 %
EOS ABS: 0.6 10*3/uL (ref 0–0.7)
EOS PCT: 5 %
HCT: 35.9 % (ref 35.0–47.0)
HEMOGLOBIN: 11.9 g/dL — AB (ref 12.0–16.0)
LYMPHS ABS: 0.7 10*3/uL — AB (ref 1.0–3.6)
LYMPHS PCT: 6 %
MCH: 32.3 pg (ref 26.0–34.0)
MCHC: 33.2 g/dL (ref 32.0–36.0)
MCV: 97 fL (ref 80.0–100.0)
MONOS PCT: 9 %
Monocytes Absolute: 1.1 10*3/uL — ABNORMAL HIGH (ref 0.2–0.9)
Neutro Abs: 10 10*3/uL — ABNORMAL HIGH (ref 1.4–6.5)
Neutrophils Relative %: 80 %
PLATELETS: 267 10*3/uL (ref 150–440)
RBC: 3.7 MIL/uL — AB (ref 3.80–5.20)
RDW: 14.4 % (ref 11.5–14.5)
WBC: 12.5 10*3/uL — AB (ref 3.6–11.0)

## 2018-06-07 LAB — PROTIME-INR
INR: 1
Prothrombin Time: 13.1 seconds (ref 11.4–15.2)

## 2018-06-07 LAB — TROPONIN I: Troponin I: <0.03 ng/mL (ref <0.03)

## 2018-06-07 LAB — BRAIN NATRIURETIC PEPTIDE: B Natriuretic Peptide: 371 pg/mL — ABNORMAL HIGH (ref 0.0–100.0)

## 2018-06-07 MED ORDER — ONDANSETRON HCL 4 MG/2ML IJ SOLN
4.0000 mg | Freq: Once | INTRAMUSCULAR | Status: AC
  Administered 2018-06-07: 4 mg via INTRAVENOUS
  Filled 2018-06-07: qty 2

## 2018-06-07 MED ORDER — DILTIAZEM HCL 60 MG PO TABS
30.0000 mg | ORAL_TABLET | Freq: Once | ORAL | Status: AC
  Administered 2018-06-08: 30 mg via ORAL
  Filled 2018-06-07: qty 1

## 2018-06-07 MED ORDER — OXYMETAZOLINE HCL 0.05 % NA SOLN
1.0000 | Freq: Once | NASAL | Status: AC
  Administered 2018-06-07: 1 via NASAL
  Filled 2018-06-07: qty 15

## 2018-06-07 MED ORDER — TORSEMIDE 20 MG PO TABS
20.0000 mg | ORAL_TABLET | Freq: Every day | ORAL | Status: DC
  Filled 2018-06-07: qty 1

## 2018-06-07 NOTE — ED Provider Notes (Signed)
[facility] Emergency Department Provider Note       Time seen: ----------------------------------------- 10:18 PM on 06/07/2018 -----------------------------------------   I have reviewed the triage vital signs and the nursing notes.  HISTORY   Chief Complaint Epistaxis    HPI Hannah Kline is a 84 y.o. female with a history of atrial fibrillation on Eliquis, COPD, bronchiectasis, hepatitis, Mycobacterium avium complex infection and chronic shortness of breath on 5 L nasal cannula oxygen who presents to the ED for epistaxis versus hemoptysis.  Patient arrives from home for possible nosebleed for the past hour or so.  Patient thinks it was coming primarily from her nose, the husband seems to think this was coming from coughing.  She has intermittent history of same.  She does have history of bronchiectasis according to her husband and is a hospice patient but not on palliative care.  She denies fevers, chills, chest pain.  Husband states she has been more short of breath today.  Past Medical History:  Diagnosis Date  . Atrial fibrillation ([place])   . Bacterial infection due to Pseudomonas   . Bronchiectasis   . COPD (chronic obstructive pulmonary disease) ([place])    . Cough    from bronchiectasis neb txs  . Dysrhythmia    A Fib  . Hepatitis    h/o INH(isoniazide)  . History of Mycobacterium avium complex infection    lung disease  . Hypothyroidism   . Pleurisy 12/08/11  . Shortness of breath dyspnea     Patient Active Problem List   Diagnosis Date Noted  . Respiratory failure, chronic ([place]) 10/15/2017  . 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 Leukocytosis 05/03/2017  . Mouth pain 05/03/2017  . Hospital discharge follow-up 05/03/2017  . Acute bronchitis with chronic obstructive pulmonary disease (COPD) ([place]) 04/24/2017  . Shortness of breath 03/25/2017  . Pulmonary hypertension ([place]) 02/28/2017  . Paroxysmal A-fib ([place]) 06/14/2016  . Weakness  generalized 05/27/2016  . Malaise and fatigue 11/24/2014  . Glucose intolerance (pre-diabetes) 11/24/2014  . Acquired hypothyroidism 09/02/2014  . Anorexia 07/14/2014  . Protein-calorie malnutrition, mild ([place]) 07/14/2014  . Abnormal mammogram 02/19/2014  . Osteoporosis 02/14/2014  . Visit for preventive health examination 01/08/2014  . History of Clostridium difficile colitis 11/23/2013  . Bronchiectasis with acute exacerbation ([place]) 10/24/2013  . Rhinitis, nonallergic 10/10/2012  . Orthostatic hypotension 09/22/2012  . Hypoxemia requiring supplemental oxygen 02/21/2012  . Chronic sinusitis 01/03/2012  . Pulmonary nodule 01/03/2012  . Screening for colon cancer 10/05/2011  . Screening for breast cancer 10/05/2011  . Bacterial infection due to Pseudomonas   . Atrial fibrillation ([place]) 09/15/2010  . BRONCHIECTASIS 09/15/2010  . Chronic bronchitis ([place]) 09/15/2010    Past Surgical History:  Procedure Laterality Date  . CATARACT EXTRACTION W/PHACO Left 01/26/2016   Procedure: CATARACT EXTRACTION PHACO AND INTRAOCULAR LENS PLACEMENT (IOC);  Surgeon: Ronnell Freshwater, MD;  Location: [place];  Service: Ophthalmology;  Laterality: Left;  . COLONOSCOPY    . SHOULDER ARTHROSCOPY     right    Allergies Ciprofloxacin; Isoniazid; and Moxifloxacin  Social History Social History   Tobacco Use  . Smoking status: Never Smoker  . Smokeless tobacco: Never Used  Substance Use Topics  . Alcohol use: Yes    Alcohol/week: 4.2 - 4.8 oz    Types: 7 Glasses of wine per week  . Drug use: No   Review of Systems Constitutional: Negative for fever. ENT: Positive for nosebleed Cardiovascular: Negative for chest pain. Respiratory: Positive for shortness  of breath and cough, possible hemoptysis Gastrointestinal: Negative for abdominal pain, vomiting and diarrhea. Musculoskeletal: Negative for back pain. Skin: Negative for rash. Neurological: Negative for headaches, focal  weakness or numbness.  All systems negative/normal/unremarkable except as stated in the HPI  ____________________________________________   PHYSICAL EXAM:  VITAL SIGNS: ED Triage Vitals  Enc Vitals Group     BP 06/07/18 2210 (!) 145/59     Pulse Rate 06/07/18 2210 89     Resp 06/07/18 2210 (!) 22     Temp 06/07/18 2210 97.8 F (36.6 C)     Temp Source 06/07/18 2210 Oral     SpO2 06/07/18 2210 (!) 88 %     Weight 06/07/18 2204 100 lb (45.4 kg)     Height 06/07/18 2204 _0  (1.727 m)     Head Circumference --      Peak Flow --      Pain Score 06/07/18 2204 0     Pain Loc --      Pain Edu? --      Excl. in [place]? --    Constitutional: Alert and oriented.  Mild to moderate distress on arrival Eyes: Conjunctivae are normal. Normal extraocular movements. ENT   Head: Normocephalic and atraumatic.   Nose: No congestion/rhinnorhea.  No obvious epistaxis at this time   Mouth/Throat: Mucous membranes are moist.   Neck: No stridor. Cardiovascular: Normal rate, regular rhythm. No murmurs, rubs, or gallops. Respiratory: Tachypnea with rales bilaterally Gastrointestinal: Soft and nontender. Normal bowel sounds Musculoskeletal: Nontender with normal range of motion in extremities. No lower extremity tenderness nor edema. Neurologic:  Normal speech and language. No gross focal neurologic deficits are appreciated.  Skin:  Skin is warm, dry and intact. No rash noted. Psychiatric: Mood and affect are normal. Speech and behavior are normal.  ____________________________________________  EKG: Interpreted by me.  Atrial fibrillation with a rapid ventricular response, rate is 121 bpm, right bundle branch block, left anterior fascicular block, LVH  ____________________________________________  ED COURSE:  As part of my medical decision making, I reviewed the following data within the [place] History obtained from family if available, nursing notes, old chart and  ekg, as well as notes from prior ED visits. Patient presented for multiple issues including epistaxis or hemoptysis, rapid A. fib with profound hypoxia down to about 50% on arrival, we will assess with labs and imaging as indicated at this time.  Patient initially required nonrebreather oxygen but we were able to wean this back to her nasal cannula at 5 L.   Procedures ____________________________________________   LABS (pertinent positives/negatives)  Labs Reviewed  CBC WITH DIFFERENTIAL/PLATELET - Abnormal; Notable for the following components:      Result Value   WBC 12.5 (*)    RBC 3.70 (*)    Hemoglobin 11.9 (*)    Neutro Abs 10.0 (*)    Lymphs Abs 0.7 (*)    Monocytes Absolute 1.1 (*)    All other components within normal limits  BASIC METABOLIC PANEL - Abnormal; Notable for the following components:   Chloride 94 (*)    CO2 37 (*)    Glucose, Bld 149 (*)    Calcium 8.6 (*)    All other components within normal limits  BRAIN NATRIURETIC PEPTIDE - Abnormal; Notable for the following components:   B Natriuretic Peptide 371.0 (*)    All other components within normal limits  TROPONIN I  PROTIME-INR   CRITICAL CARE Performed by: Laurence Aly  Total critical care time: 30 minutes  Critical care time was exclusive of separately billable procedures and treating other patients.  Critical care was necessary to treat or prevent imminent or life-threatening deterioration.  Critical care was time spent personally by me on the following activities: development of treatment plan with patient and/or surrogate as well as nursing, discussions with consultants, evaluation of patient's response to treatment, examination of patient, obtaining history from patient or surrogate, ordering and performing treatments and interventions, ordering and review of laboratory studies, ordering and review of radiographic studies, pulse oximetry and re-evaluation of patient's  condition.  CRITICAL CARE Performed by: Laurence Aly   Total critical care time: 30 minutes  Critical care time was exclusive of separately billable procedures and treating other patients.  Critical care was necessary to treat or prevent imminent or life-threatening deterioration.  Critical care was time spent personally by me on the following activities: development of treatment plan with patient and/or surrogate as well as nursing, discussions with consultants, evaluation of patient's response to treatment, examination of patient, obtaining history from patient or surrogate, ordering and performing treatments and interventions, ordering and review of laboratory studies, ordering and review of radiographic studies, pulse oximetry and re-evaluation of patient's condition.   RADIOLOGY  Chest x-ray IMPRESSION: 1. Increased basilar reticulations suspicious for pulmonary edema superimposed on chronic lung disease. 2. Slight increased heart size since prior radiographs may be cardiomegaly versus differences in technique. Aortic Atherosclerosis (ICD10-I70.0).  ____________________________________________  DIFFERENTIAL DIAGNOSIS   CHF, bronchiectasis, medication side effect, epistaxis, thrombocytopenia, PE, pneumothorax  FINAL ASSESSMENT AND PLAN  Hypoxia, epistaxis, mild CHF exacerbation, rapid atrial fibrillation   Plan: The patient had presented for epistaxis and was profoundly hypoxic initially on arrival. Patient's labs did not reveal any acute process. Patient's imaging did reveal slightly increased heart size and likely some pulmonary edema.  I did give her her dose of Demadex that she was due for as well as an additional dose of Cardizem.  Given the fact that she had a profound hypoxic episode, possibly had hemoptysis and does have some signs of congestive heart failure she would benefit from at least observation.  I will discuss with the hospitalist for  admission.   Laurence Aly, MD   Note: This note was generated in part or whole with voice recognition software. Voice recognition is usually quite accurate but there are transcription errors that can and very often do occur. I apologize for any typographical errors that were not detected and corrected.     Earleen Newport, MD 06/07/18 [id number]

## 2018-06-07 NOTE — Telephone Encounter (Signed)
Called and spoke with Tammy with [facility] at phone [phone]  Pt is having cardioversion on Monday per Cardiology She will have to revoke hospice services because it is seeking agressive treatment. Medicare and Hospice will not pay for this procedure Pt will have to pay out of pocket for the procedure on Monday  Pt is revoking hospice care as of today, and will return back to hospice care once another referral is completed by our office after the cardioversion is completed.  BQ please advise if another referral can be sent to hospice next week.

## 2018-06-07 NOTE — Telephone Encounter (Signed)
yes

## 2018-06-07 NOTE — Telephone Encounter (Signed)
Patient husband  returning call to see if labs are ok for upcoming procedure .

## 2018-06-07 NOTE — ED Triage Notes (Signed)
Pt presents to ED with nose bleed for the past hour or so. Hx of the same. On O2 (5L) via nasal cannula. Pt states the has been coughing up a little blood as well but pt states she thinks it is due to her nosebleed. Pt has bronchiectasis, per her husband, and is a hospice pt.

## 2018-06-08 ENCOUNTER — Encounter: Payer: Self-pay | Admitting: *Deleted

## 2018-06-08 ENCOUNTER — Observation Stay (HOSPITAL_COMMUNITY)
Admit: 2018-06-08 | Discharge: 2018-06-08 | Disposition: A | Discharge status: Home / Self Care | Attending: Internal Medicine | Admitting: Internal Medicine

## 2018-06-08 ENCOUNTER — Other Ambulatory Visit: Payer: Self-pay

## 2018-06-08 DIAGNOSIS — I361 Nonrheumatic tricuspid (valve) insufficiency: Secondary | ICD-10-CM | POA: Diagnosis not present

## 2018-06-08 DIAGNOSIS — E43 Unspecified severe protein-calorie malnutrition: Secondary | ICD-10-CM | POA: Diagnosis not present

## 2018-06-08 DIAGNOSIS — J432 Centrilobular emphysema: Secondary | ICD-10-CM | POA: Diagnosis not present

## 2018-06-08 DIAGNOSIS — Z8619 Personal history of other infectious and parasitic diseases: Secondary | ICD-10-CM | POA: Diagnosis not present

## 2018-06-08 DIAGNOSIS — I481 Persistent atrial fibrillation: Secondary | ICD-10-CM | POA: Diagnosis not present

## 2018-06-08 DIAGNOSIS — Z881 Allergy status to other antibiotic agents status: Secondary | ICD-10-CM | POA: Diagnosis not present

## 2018-06-08 DIAGNOSIS — R0902 Hypoxemia: Secondary | ICD-10-CM | POA: Diagnosis not present

## 2018-06-08 DIAGNOSIS — J471 Bronchiectasis with (acute) exacerbation: Secondary | ICD-10-CM | POA: Diagnosis not present

## 2018-06-08 DIAGNOSIS — J811 Chronic pulmonary edema: Secondary | ICD-10-CM | POA: Diagnosis not present

## 2018-06-08 DIAGNOSIS — J449 Chronic obstructive pulmonary disease, unspecified: Secondary | ICD-10-CM | POA: Diagnosis not present

## 2018-06-08 DIAGNOSIS — R040 Epistaxis: Secondary | ICD-10-CM

## 2018-06-08 DIAGNOSIS — Z7901 Long term (current) use of anticoagulants: Secondary | ICD-10-CM | POA: Diagnosis not present

## 2018-06-08 DIAGNOSIS — R634 Abnormal weight loss: Secondary | ICD-10-CM | POA: Diagnosis not present

## 2018-06-08 DIAGNOSIS — J479 Bronchiectasis, uncomplicated: Secondary | ICD-10-CM | POA: Diagnosis not present

## 2018-06-08 DIAGNOSIS — Z7951 Long term (current) use of inhaled steroids: Secondary | ICD-10-CM | POA: Diagnosis not present

## 2018-06-08 DIAGNOSIS — E039 Hypothyroidism, unspecified: Secondary | ICD-10-CM | POA: Diagnosis not present

## 2018-06-08 DIAGNOSIS — I480 Paroxysmal atrial fibrillation: Secondary | ICD-10-CM | POA: Diagnosis not present

## 2018-06-08 DIAGNOSIS — J8410 Pulmonary fibrosis, unspecified: Secondary | ICD-10-CM | POA: Diagnosis present

## 2018-06-08 DIAGNOSIS — J9621 Acute and chronic respiratory failure with hypoxia: Secondary | ICD-10-CM | POA: Diagnosis not present

## 2018-06-08 DIAGNOSIS — J810 Acute pulmonary edema: Secondary | ICD-10-CM | POA: Diagnosis not present

## 2018-06-08 DIAGNOSIS — Z888 Allergy status to other drugs, medicaments and biological substances status: Secondary | ICD-10-CM | POA: Diagnosis not present

## 2018-06-08 DIAGNOSIS — Z79890 Hormone replacement therapy (postmenopausal): Secondary | ICD-10-CM | POA: Diagnosis not present

## 2018-06-08 DIAGNOSIS — A312 Disseminated mycobacterium avium-intracellulare complex (DMAC): Secondary | ICD-10-CM | POA: Diagnosis not present

## 2018-06-08 DIAGNOSIS — I4891 Unspecified atrial fibrillation: Secondary | ICD-10-CM | POA: Diagnosis not present

## 2018-06-08 DIAGNOSIS — I484 Atypical atrial flutter: Secondary | ICD-10-CM | POA: Diagnosis not present

## 2018-06-08 DIAGNOSIS — J9611 Chronic respiratory failure with hypoxia: Secondary | ICD-10-CM | POA: Diagnosis not present

## 2018-06-08 DIAGNOSIS — I5033 Acute on chronic diastolic (congestive) heart failure: Secondary | ICD-10-CM | POA: Diagnosis not present

## 2018-06-08 DIAGNOSIS — I5043 Acute on chronic combined systolic (congestive) and diastolic (congestive) heart failure: Secondary | ICD-10-CM | POA: Diagnosis present

## 2018-06-08 DIAGNOSIS — I272 Pulmonary hypertension, unspecified: Secondary | ICD-10-CM | POA: Diagnosis not present

## 2018-06-08 DIAGNOSIS — J9622 Acute and chronic respiratory failure with hypercapnia: Secondary | ICD-10-CM | POA: Diagnosis present

## 2018-06-08 DIAGNOSIS — Z681 Body mass index (BMI) 19 or less, adult: Secondary | ICD-10-CM | POA: Diagnosis not present

## 2018-06-08 DIAGNOSIS — K5900 Constipation, unspecified: Secondary | ICD-10-CM | POA: Diagnosis present

## 2018-06-08 DIAGNOSIS — Z9981 Dependence on supplemental oxygen: Secondary | ICD-10-CM | POA: Diagnosis not present

## 2018-06-08 DIAGNOSIS — Z66 Do not resuscitate: Secondary | ICD-10-CM | POA: Diagnosis present

## 2018-06-08 DIAGNOSIS — R042 Hemoptysis: Secondary | ICD-10-CM | POA: Diagnosis not present

## 2018-06-08 LAB — CBC
HEMATOCRIT: 31.2 % — AB (ref 35.0–47.0)
HEMOGLOBIN: 10.4 g/dL — AB (ref 12.0–16.0)
MCH: 32.3 pg (ref 26.0–34.0)
MCHC: 33.3 g/dL (ref 32.0–36.0)
MCV: 97 fL (ref 80.0–100.0)
Platelets: 233 10*3/uL (ref 150–440)
RBC: 3.22 MIL/uL — ABNORMAL LOW (ref 3.80–5.20)
RDW: 14.4 % (ref 11.5–14.5)
WBC: 10.9 10*3/uL (ref 3.6–11.0)

## 2018-06-08 LAB — BASIC METABOLIC PANEL
ANION GAP: 5 (ref 5–15)
BUN: 13 mg/dL (ref 8–23)
CO2: 38 mmol/L — ABNORMAL HIGH (ref 22–32)
Calcium: 8.1 mg/dL — ABNORMAL LOW (ref 8.9–10.3)
Chloride: 93 mmol/L — ABNORMAL LOW (ref 98–111)
Creatinine, Ser: 0.61 mg/dL (ref 0.44–1.00)
GFR calc Af Amer: >60 mL/min (ref >60)
GFR calc non Af Amer: >60 mL/min (ref >60)
GLUCOSE: 118 mg/dL — AB (ref 70–99)
POTASSIUM: 4.3 mmol/L (ref 3.5–5.1)
Sodium: 136 mmol/L (ref 135–145)

## 2018-06-08 LAB — ECHOCARDIOGRAM COMPLETE
Height: 68 in
Weight: 1671.97 oz

## 2018-06-08 LAB — GLUCOSE, CAPILLARY: Glucose-Capillary: 98 mg/dL (ref 70–99)

## 2018-06-08 MED ORDER — BISACODYL 5 MG PO TBEC
5.0000 mg | DELAYED_RELEASE_TABLET | Freq: Every day | ORAL | Status: DC | PRN
  Administered 2018-06-09: 5 mg via ORAL
  Filled 2018-06-08 (×2): qty 1

## 2018-06-08 MED ORDER — SODIUM CHLORIDE 0.9% FLUSH
3.0000 mL | Freq: Two times a day (BID) | INTRAVENOUS | Status: DC
  Administered 2018-06-08 – 2018-06-11 (×7): 3 mL via INTRAVENOUS

## 2018-06-08 MED ORDER — DORZOLAMIDE HCL 2 % OP SOLN
1.0000 [drp] | Freq: Two times a day (BID) | OPHTHALMIC | Status: DC
  Administered 2018-06-08 – 2018-06-12 (×9): 1 [drp] via OPHTHALMIC
  Filled 2018-06-08: qty 10

## 2018-06-08 MED ORDER — DILTIAZEM HCL 30 MG PO TABS
30.0000 mg | ORAL_TABLET | Freq: Four times a day (QID) | ORAL | Status: DC
  Administered 2018-06-08 – 2018-06-10 (×8): 30 mg via ORAL
  Filled 2018-06-08 (×8): qty 1

## 2018-06-08 MED ORDER — FAMOTIDINE 20 MG PO TABS: 20.0000 mg | ORAL_TABLET | Freq: Every day | ORAL | Status: DC | PRN

## 2018-06-08 MED ORDER — LEVOTHYROXINE SODIUM 50 MCG PO TABS
75.0000 ug | ORAL_TABLET | Freq: Every day | ORAL | Status: DC
  Administered 2018-06-08 – 2018-06-12 (×5): 75 ug via ORAL
  Filled 2018-06-08 (×5): qty 1

## 2018-06-08 MED ORDER — PROPAFENONE HCL 150 MG PO TABS
150.0000 mg | ORAL_TABLET | Freq: Two times a day (BID) | ORAL | Status: DC
  Filled 2018-06-08: qty 1

## 2018-06-08 MED ORDER — POTASSIUM CHLORIDE CRYS ER 20 MEQ PO TBCR: 40.0000 meq | EXTENDED_RELEASE_TABLET | Freq: Once | ORAL | Status: DC

## 2018-06-08 MED ORDER — ONDANSETRON HCL 4 MG/2ML IJ SOLN
4.0000 mg | Freq: Four times a day (QID) | INTRAMUSCULAR | Status: DC | PRN
  Administered 2018-06-08 – 2018-06-11 (×4): 4 mg via INTRAVENOUS
  Filled 2018-06-08 (×4): qty 2

## 2018-06-08 MED ORDER — ENSURE ENLIVE PO LIQD: 237.0000 mL | ORAL | Status: DC | PRN

## 2018-06-08 MED ORDER — ONDANSETRON HCL 4 MG PO TABS: 4.0000 mg | ORAL_TABLET | Freq: Four times a day (QID) | ORAL | Status: DC | PRN

## 2018-06-08 MED ORDER — APIXABAN 2.5 MG PO TABS
2.5000 mg | ORAL_TABLET | Freq: Two times a day (BID) | ORAL | Status: DC
  Administered 2018-06-08 – 2018-06-12 (×9): 2.5 mg via ORAL
  Filled 2018-06-08 (×9): qty 1

## 2018-06-08 MED ORDER — POTASSIUM CHLORIDE CRYS ER 20 MEQ PO TBCR
20.0000 meq | EXTENDED_RELEASE_TABLET | Freq: Every day | ORAL | Status: DC
  Administered 2018-06-09 – 2018-06-12 (×4): 20 meq via ORAL
  Filled 2018-06-08 (×4): qty 1

## 2018-06-08 MED ORDER — BUDESONIDE 0.5 MG/2ML IN SUSP
0.5000 mg | Freq: Two times a day (BID) | RESPIRATORY_TRACT | Status: DC
Start: 2018-06-08 — End: 2018-06-12
  Administered 2018-06-08 – 2018-06-12 (×9): 0.5 mg via RESPIRATORY_TRACT
  Filled 2018-06-08 (×9): qty 2

## 2018-06-08 MED ORDER — FUROSEMIDE 10 MG/ML IJ SOLN
20.0000 mg | Freq: Two times a day (BID) | INTRAMUSCULAR | Status: DC
  Administered 2018-06-08 – 2018-06-09 (×3): 20 mg via INTRAVENOUS
  Filled 2018-06-08 (×4): qty 2

## 2018-06-08 MED ORDER — HYDROCODONE-ACETAMINOPHEN 5-325 MG PO TABS: 1.0000 | ORAL_TABLET | ORAL | Status: DC | PRN

## 2018-06-08 MED ORDER — PROPAFENONE HCL 225 MG PO TABS
225.0000 mg | ORAL_TABLET | Freq: Two times a day (BID) | ORAL | Status: DC
  Administered 2018-06-08 – 2018-06-12 (×9): 225 mg via ORAL
  Filled 2018-06-08 (×10): qty 1

## 2018-06-08 MED ORDER — DILTIAZEM HCL 30 MG PO TABS: 30.0000 mg | ORAL_TABLET | Freq: Three times a day (TID) | ORAL | Status: DC | PRN

## 2018-06-08 MED ORDER — ALBUTEROL SULFATE (2.5 MG/3ML) 0.083% IN NEBU
2.5000 mg | INHALATION_SOLUTION | RESPIRATORY_TRACT | Status: DC | PRN
  Administered 2018-06-10 – 2018-06-11 (×2): 2.5 mg via RESPIRATORY_TRACT
  Filled 2018-06-08 (×3): qty 3

## 2018-06-08 MED ORDER — MORPHINE SULFATE (CONCENTRATE) 10 MG/0.5ML PO SOLN
5.0000 mg | ORAL | Status: DC | PRN
  Administered 2018-06-09 (×2): 5 mg via ORAL
  Filled 2018-06-08 (×3): qty 1

## 2018-06-08 MED ORDER — ACETAMINOPHEN 650 MG RE SUPP: 650.0000 mg | Freq: Four times a day (QID) | RECTAL | Status: DC | PRN

## 2018-06-08 MED ORDER — ACETAMINOPHEN 325 MG PO TABS: 650.0000 mg | ORAL_TABLET | Freq: Four times a day (QID) | ORAL | Status: DC | PRN

## 2018-06-08 MED ORDER — LORATADINE 10 MG PO TABS
10.0000 mg | ORAL_TABLET | Freq: Every day | ORAL | Status: DC
  Administered 2018-06-08 – 2018-06-12 (×5): 10 mg via ORAL
  Filled 2018-06-08 (×5): qty 1

## 2018-06-08 MED ORDER — DOCUSATE SODIUM 100 MG PO CAPS
100.0000 mg | ORAL_CAPSULE | Freq: Two times a day (BID) | ORAL | Status: DC
  Administered 2018-06-08 – 2018-06-12 (×9): 100 mg via ORAL
  Filled 2018-06-08 (×9): qty 1

## 2018-06-08 MED ORDER — ARFORMOTEROL TARTRATE 15 MCG/2ML IN NEBU
15.0000 ug | INHALATION_SOLUTION | Freq: Two times a day (BID) | RESPIRATORY_TRACT | Status: DC
  Administered 2018-06-08 – 2018-06-12 (×8): 15 ug via RESPIRATORY_TRACT
  Filled 2018-06-08 (×12): qty 2

## 2018-06-08 MED ORDER — TRAZODONE HCL 50 MG PO TABS: 25.0000 mg | ORAL_TABLET | Freq: Every evening | ORAL | Status: DC | PRN

## 2018-06-08 MED ORDER — ORAL CARE MOUTH RINSE
15.0000 mL | Freq: Two times a day (BID) | OROMUCOSAL | Status: DC
  Administered 2018-06-08 – 2018-06-10 (×6): 15 mL via OROMUCOSAL

## 2018-06-08 MED ORDER — PROPAFENONE HCL ER 225 MG PO CP12
225.0000 mg | ORAL_CAPSULE | Freq: Two times a day (BID) | ORAL | Status: DC
  Filled 2018-06-08 (×2): qty 1

## 2018-06-08 NOTE — Progress Notes (Addendum)
Cardiovascular and Pulmonary Nurse Navigator Note:    84 year old female who presented to the ED with known history of chronic atrial fibrillation on Eliquis, COPD, chronic respiratory failure on 5 liters of oxygen at home, with chief complaint of increasing SOB. Patient is also followed by hospice at home.  BNP on admission was 371.  Troponin was < 0.03.  CXR suspicious for pulmonary edema superimposed on chronic lung disease.  EKG revealed afib with RVR.  Patient admitted with dx of acute on chronic respiratory failure felt to be due to acute pulmonary edema.  Other active problems this admission include:  Afib with RVR, Hemoptysis/? epistaxis (resolved), severe COPD, diastolic CHF with acute exacerbation.    CHF Education:?? Educational session with patient / husband completed. Provided patient with "Living Better with Heart Failure" packet. Briefly reviewed definition of heart failure and signs and symptoms of an exacerbation.?Explained to patient that HF is a chronic illness which requires self-assessment / self-management along with help from the cardiologist/PCP.?Patient and husband both stated that patient has never been told that she has CHF.  This RN explained the BNP lab result and CXR results.  This RN also informed patient and husband that the echo will provide more information.   ? *Reviewed importance of and reason behind checking weight daily in the AM, after using the bathroom, but before getting dressed. Patient has scales. Hospice nurse weighs patient once a week.  Patient has not been weighing herself daily.   ? *Reviewed with patient the following information: *Discussed when to call the Dr= weight gain of >2-3lb overnight of 5lb in a week,  *Discussed yellow zone= call MD: weight gain of >2-3lb overnight of 5lb in a week, increased swelling, increased SOB when lying down, chest discomfort, dizziness, increased fatigue *Red Zone= call 911: struggle to breath, fainting or near  fainting, significant chest pain   *Reviewed low sodium diet-provided handout of recommended and not recommended foods. Husband stated, "We do not use a lot of salt in our food."    *Discussed fluid intake with patient as well. Patient not currently on a fluid restriction, but advised no more than 8-8 ounces glass of fluids per day.? ? *Instructed patient to take medications as prescribed for heart failure. Explained briefly why pt is on the medications (either make you feel better, live longer or keep you out of the hospital) and discussed monitoring and side effects.  ? *Exercise/Activity - Patient on on 5 liters of oxygen chronically.  Instructed patient to remain as active as possible.  ?  *Smoking Cessation- Patient is a NEVER  smoker.? ? *[facility] Failure [facility] - Explained the purpose of the HF Clinic.  Explained to patient the HF Clinic does not replace PCP nor Cardiologist, but is an additional resource to helping patient manage heart failure at home. Patient's appointment for the Heart Failure Clinic is scheduled for June 19, 2018 at 9:20 a.m.  Patient and husband agreeable for patient to be followed in the [facility] HF Clinic.   Patient and Husband need reinforcement of the CHF education.    Patient / husband thanked me for providing the above information. ? ? Roanna Epley, RN, BSN, [facility]? [facility]  Cardiovascular & Pulmonary Nurse Navigator  Direct Line: [phone]  Department Phone #: [phone] Fax: [phone]? Email Address: [email or web address]

## 2018-06-08 NOTE — Progress Notes (Signed)
Initial Nutrition Assessment  DOCUMENTATION CODES:   Severe malnutrition in context of chronic illness, Underweight  INTERVENTION:   - Magic cup TID with meals, each supplement provides 290 kcal and 9 grams of protein  - Ensure Enlive po PRN, each supplement provides 350 kcal and 20 grams of protein  - Encourage adequate PO intake  NUTRITION DIAGNOSIS:   Severe Malnutrition related to chronic illness (COPD, chronic respiratory failure) as evidenced by moderate fat depletion, severe fat depletion, moderate muscle depletion, severe muscle depletion.  GOAL:   Patient will meet greater than or equal to 90% of their needs  MONITOR:   PO intake, Weight trends, Supplement acceptance, Labs  REASON FOR ASSESSMENT:   Other (underweight BMI)    ASSESSMENT:   84 year old female who presented to th ED with epistaxis. PMH significant for atrial fibrillation on Eliquis, COPD, bronchiectasis, hepatitis, and chronic respiratory failure on 5 L oxygen at home. Pt is enrolled with hospice.  Discussed pt with RN and with hospice RN.  Spoke with pt at bedside. Pt eating from lunch meal tray at time of visit. Pt had ordered fruit cup, tea, [place] with gravy, carrots, zucchini, and a roll. Pt states "I am not doing good with lunch." Pt reports having no appetite, stating "I ordered this food, but now I don't know if I can eat it." RD provided encouragement.  Pt states that she eats 3 meals daily at home and typically eats 2/3 of each meal: Breakfast: oatmeal OR cold cereal OR eggs and toast Lunch: soup and grilled cheese sandwich with tomato Dinner: "my husband cooks a good dinner"  Pt endorses significant weight loss, stating that her UBW is 140 lbs and that she last weighed this 5-6 months ago. Per weight history in chart, pt's weight has fluctuated between 99-115 lbs over the past year. Pt weighed 99 lbs on 05/19/18 and now weighs 104 lbs.  Pt agreeable to trying Magic Cup TID with meals. Pt  requests order for Ensure Enlive be PRN.  Meal Completion: 95%  Medications reviewed and include: 100 mg Colace BID, 20 mg Lasix BID, 75 mcg levothyroxine daily, 20 mEq K-dur daily (to start tomorrow)  Labs reviewed: CO2 38 (H), hemoglobin 10.4 (L), HCT 31.2 (L)  NUTRITION - FOCUSED PHYSICAL EXAM:    Most Recent Value  Orbital Region  Severe depletion  Upper Arm Region  Severe depletion  Thoracic and Lumbar Region  Severe depletion  Buccal Region  Moderate depletion  Temple Region  Moderate depletion  Clavicle Bone Region  Severe depletion  Clavicle and Acromion Bone Region  Severe depletion  Scapular Bone Region  Severe depletion  Dorsal Hand  Moderate depletion  Patellar Region  Severe depletion  Anterior Thigh Region  Severe depletion  Posterior Calf Region  Severe depletion  Edema (RD Assessment)  None  Hair  Reviewed  Eyes  Reviewed  Mouth  Reviewed  Skin  Reviewed  Nails  Reviewed       Diet Order:   Diet Order           Diet Heart Room service appropriate? Yes; Fluid consistency: Thin  Diet effective now          EDUCATION NEEDS:   No education needs have been identified at this time  Skin:  Skin Assessment: Reviewed RN Assessment  Last BM:  unknown/PTA  Height:   Ht Readings from Last 1 Encounters:  06/08/18 5\' 8"  (1.727 m)    Weight:   Wt Readings from  Last 1 Encounters:  06/08/18 104 lb 8 oz (47.4 kg)    Ideal Body Weight:  63.64 kg  BMI:  Body mass index is 15.89 kg/m.  Estimated Nutritional Needs:   Kcal:  1420-1610 kcal/day (30-34 kcal/kg)  Protein:  62-76 grams/day (1.3-1.6 grams/kg)  Fluid:  1.4-1.6 L/day    Gaynell Face, MS, RD, LDN Pager: [phone] Weekend/After Hours: [phone]

## 2018-06-08 NOTE — Consult Note (Signed)
Cardiology Consult    Patient ID: Hannah Kline MRN: [id number], DOB/AGE: 01-06-1932   Admit date: 06/07/2018 Date of Consult: 06/08/2018  Primary Physician: Crecencio Mc, MD Primary Cardiologist: Dr. Tsosie Billing Requesting Provider: Dr. Amelia Jo   Patient Profile    Lenetta Piche is a 84 y.o. female with a history of paroxysmal Afib (Eliquis), COPD, chronic respiratory failure on 5L O2 per home [place], and enrolled in hospice who presented to the hospital for an episode of hemoptysis and reported previous episode of hemoptysis (~Nov 2018) who is being seen today for the evaluation of Afib with RVR at the request of Dr. Amelia Jo.  Past Medical History   Past Medical History:  Diagnosis Date  . Atrial fibrillation ([place])   . Bacterial infection due to Pseudomonas   . Bronchiectasis   . COPD (chronic obstructive pulmonary disease) ([place])    . Cough    from bronchiectasis neb txs  . Dysrhythmia    A Fib  . Hepatitis    h/o INH(isoniazide)  . History of Mycobacterium avium complex infection    lung disease  . Hypothyroidism   . Pleurisy 12/08/11  . Shortness of breath dyspnea     Past Surgical History:  Procedure Laterality Date  . CATARACT EXTRACTION W/PHACO Left 01/26/2016   Procedure: CATARACT EXTRACTION PHACO AND INTRAOCULAR LENS PLACEMENT (IOC);  Surgeon: Ronnell Freshwater, MD;  Location: [facility];  Service: Ophthalmology;  Laterality: Left;  . COLONOSCOPY    . SHOULDER ARTHROSCOPY     right     Allergies  Allergies  Allergen Reactions  . Ciprofloxacin Other (See Comments)    c-diff  . Isoniazid     Hepatitis, weakness, nausea  . Moxifloxacin     Avelox  REACTION: chills, fainting    History of Present Illness    84 yo female and known patient to Dr. Tsosie Billing with h/o paroxysmal atrial fibrillation in the context of severe COPD and respiratory failure on 5L home 02 and with remote MAI / recurrent problems with bronchiectasis. Per patient  report, she had a previous episode of hemoptysis November 2018 that was similar to this one and reportedly d/t her lung disease. Per Dr. Caryl Comes 05/19/18 documentation, patient in atrial flutter during her 05/19/18 office visit with h/o past failed antiarrhythmic medical therapy, including stopped amiodarone and failed sotalol. Per Dr. Caryl Comes documentation, it was suspected that this arrhythmia was contributing to her pulmonary status. DCCV was reportedly preferred by Dr. Caryl Comes over TEE; therefore, cardioversion was scheduled for 06/12/18 with ASA discontinued and a 3 week regimen of uninterrupted apixaban started in preparation for DCCV. INR / PT (06/07/18) as below. TTE 02/07/17: LVEF 55-60% with PASP elevated in 60 range.     06/07/18, patient presented to the ED from hospice d/t a 1 hour episode of hemoptysis (now resolved). Per husband report, patient also SOB during the day. In the ED, patient found to be hypoxic with SpO2 85% on 5L [place]. BNP 371. Troponin negative x1. Further labs showed WBC 12.5, Hgb 11.9, Plt 267, Na 137, K 3.6, glucose 149, Cr 0.61 (Cr 0.80, 7/29), Ca 8.6. INR 1.00; PT 13.1. CXR suspicious for pulmonary edema superimposed on chronic lung disease. Per ED documentation, EKG showed Afib with RVR at 121 bpm, RBBB, LAFB, and LVH, and no acute ischemic changes. She was treated with IV Lasix and oxygen therapy, diltiazem po, and Eliquis continued. Patient's hemoptysis reportedly resolved prior to admission and consultation.   In  terms of cardiac sx, today (06/08/18), patient asymptomatic in Afib/Aflutter. She reports she is able to sleep flat. She denies racing heart rate, palpitations, chest pain, chest pressure. She reports nausea and reduced appetite that this is not new to her. She continues to report SOB, attributed to her lung disease and with a previous episode of hemoptysis ~10 mo ago (Nov 2018). Both patient and husband feel that SOB and hemoptysis is due to lung disease and are anxious to know if  DCCV is still scheduled for 06/12/18. They report medication compliance and that they have not missed a dose of her Eliquis (INR, PT as above). On exam, patient's heart rate is tachycardic, rate IRIR; lungs with b/l wheezing, crackles, and reduced lower lobe breath sounds; b/l 1-2+ edema in ankles.   HR labile with rates 68bpm-120bpm, IRIR, BP 113/47, and SpO2 100% on [place].  Discontinued torsemide as patient already on IV lasix. Discontinued antiarrhythmic as patient currently completing 3 week Eliquis anticoagulation protocol.  Inpatient Medications    . apixaban  2.5 mg Oral BID  . arformoterol  15 mcg Nebulization BID  . budesonide  0.5 mg Nebulization BID  . docusate sodium  100 mg Oral BID  . dorzolamide  1 drop Left Eye BID  . furosemide  20 mg Intravenous BID  . levothyroxine  75 mcg Oral QAC breakfast  . loratadine  10 mg Oral Daily  . propafenone  150 mg Oral BID  . torsemide  20 mg Oral Daily    Family History    Family History  Problem Relation Age of Onset  . Angina Mother   . Other Mother        intestional blockage  . Diabetes Maternal Grandfather    indicated that her mother is deceased. She indicated that her father is deceased. She indicated that all of her four brothers are alive. She indicated that her maternal grandfather is deceased.   Social History    Social History   Socioeconomic History  . Marital status: Married    Spouse name: Not on file  . Number of children: 5  . Years of education: Not on file  . Highest education level: Not on file  Occupational History  . Occupation: Chiropractor: retired  Scientific laboratory technician  . Financial resource strain: Not hard at all  . Food insecurity:    Worry: Never true    Inability: Never true  . Transportation needs:    Medical: No    Non-medical: No  Tobacco Use  . Smoking status: Never Smoker  . Smokeless tobacco: Never Used  Substance and Sexual Activity  . Alcohol use: Yes     Alcohol/week: 4.2 - 4.8 oz    Types: 7 Glasses of wine per week  . Drug use: No  . Sexual activity: Not Currently  Lifestyle  . Physical activity:    Days per week: Not on file    Minutes per session: Not on file  . Stress: Not on file  Relationships  . Social connections:    Talks on phone: Not on file    Gets together: Not on file    Attends religious service: Not on file    Active member of club or organization: Not on file    Attends meetings of clubs or organizations: Not on file    Relationship status: Not on file  . Intimate partner violence:    Fear of current or ex partner: No  Emotionally abused: No    Physically abused: No    Forced sexual activity: No  Other Topics Concern  . Not on file  Social History Narrative  . Not on file     Review of Systems    General:  No chills, fever, night sweats or weight changes.  Cardiovascular:  No chest pain /tightness, pressure. No feelings of heart racing, palpitations, or any other cardiac sx. +++dyspnea on exertion, +++edema, no orthopnea, no paroxysmal nocturnal dyspnea. Dermatological: No rash, lesions/masses. +++Patient reports ongoing & intermittent hives for some time Respiratory: +++ cough with hemoptysis (now resolved), +++dyspnea Urologic: No hematuria, dysuria Abdominal:   +++nausea, no vomiting, no diarrhea, nobright red blood per rectum, nomelena, nohematemesis. Patient reports no appetite, which is not new to her.  Neurologic:  No visual changes, wkns, changes in mental status. All other systems reviewed and are otherwise negative except as noted above.  Physical Exam    Blood pressure (!) 113/47, pulse 68, temperature 97.8 F (36.6 C), temperature source Oral, resp. rate 18, height _0  (1.727 m), weight 104 lb 8 oz (47.4 kg), SpO2 100 %.  General: Pleasant, NAD. Frail / cachetic, elderly woman eating breakfast. Husband in room with her. Psych: Normal affect. Neuro: Alert and oriented X 3. Moves all  extremities spontaneously. HEENT: Normal  Neck: Supple without bruits. Lungs:  Resp regular but labored. Wheezing and crackles heard on exam, reduced breath sounds at bases Heart: IRIR, tachycardic. No s3, s4, or murmurs. Abdomen: Soft, non-tender, non-distended, BS + x 4.  Extremities: No clubbing, cyanosis. +++ edema. DP/PT/Radials 2+ and equal bilaterally.  Labs    Troponin (Point of Care Test) No results for input(s): TROPIPOC in the last 72 hours. Recent Labs    06/07/18 2227  TROPONINI <0.03   Lab Results  Component Value Date   WBC 10.9 06/08/2018   HGB 10.4 (L) 06/08/2018   HCT 31.2 (L) 06/08/2018   MCV 97.0 06/08/2018   PLT 233 06/08/2018    Recent Labs  Lab 06/08/18 0525  NA 136  K 4.3  CL 93*  CO2 38*  BUN 13  CREATININE 0.61  CALCIUM 8.1*  GLUCOSE 118*   Lab Results  Component Value Date   CHOL 177 01/22/2016   HDL 53.50 01/22/2016   LDLCALC 108 (H) 01/22/2016   TRIG 78.0 01/22/2016   No results found for: [facility]   Radiology Studies    Dg Chest Port 1 View  Result Date: 06/07/2018 CLINICAL DATA:  Dyspnea. EXAM: PORTABLE CHEST 1 VIEW COMPARISON:  Radiographs 10/01/2017, CT 02/17/2017 FINDINGS: Chronic hyperinflation. Increased interstitial opacities at the lung bases compared to prior exam, with chronic reticular opacities in the upper lobes unchanged. Slight increased heart size from prior exam, may be accentuated by differences in technique. Aortic atherosclerosis. Chronic blunting of costophrenic angles likely due to hyperinflation. No pneumothorax. IMPRESSION: 1. Increased basilar reticulations suspicious for pulmonary edema superimposed on chronic lung disease. 2. Slight increased heart size since prior radiographs may be cardiomegaly versus differences in technique. Aortic Atherosclerosis (ICD10-I70.0). Electronically Signed   By: Jeb Levering M.D.   On: 06/07/2018 22:39    ECG & Cardiac Imaging    TTE 06/08/18 Pending results  TTE    02/07/18 - Left ventricle: The cavity size was normal. Systolic function was   normal. The estimated ejection fraction was in the range of 55%   to 60%. Wall motion was normal; there were no regional wall   motion abnormalities. Left ventricular  diastolic function   parameters were normal. - Left atrium: The atrium was normal in size. - Right ventricle: Systolic function was normal. - Tricuspid valve: There was moderate regurgitation. - Pulmonary arteries: Systolic pressure was moderate to severely   elevated. PA peak pressure: 61 mm Hg (S).   Assessment & Plan    1. Chronic paroxysmal Afib with RVR  - Per Dr. Caryl Comes documentation, patient reportedly was scheduled for DCCV after completion of 3 weeks anticoagulation on Eliquis. Also per Dr. Caryl Comes documentation, patient has been in Afib since last appointment on 05/19/18 with scheduled DCCV 06/12/18. Patient and husband both wish to confirm that this cardioversion is still scheduled. - CHA2DS2VASc score of at least 3 (agex2, female) - Propafenone 138m tab po bid ordered in ED. Discontinue as pt has not been anticoagulated for 3 weeks. - Per 4/2 echo documentation, heart muscle function stable, LVEF normal, and LA normal size; 02/07/17 echo did not suggest dyspnea of cardiac etiology. Cardiac enzymes negative as below. 7/31 ED CXR suggests likely SOB d/t pulmo Pending 06/08/18 ED ordered echo results.  - Discontinue torsemide. Patient already on IV diuresis with lasix as below. - Continue 271mIV lasix BID. Patient with b/l 2+ ankle edema on exam today (8/1).  - Ordered KCl tab 2046mo qd starting tomorrow.   - K+ 3.6  K+ 4.3. Continue to monitor.  - BNP 371 (7/31)  - Troponin negative x1 (7/31) - Renal function stable. Cr 0.64 (7/31)   Cr 0.61 (8/1); BUN 14 (7/31)  BUN 13 (8/1). Continue to monitor. - Cardizem 31m44m TID, PRN for rate control ordered in the ED. Continue for rate control.  - HR labile with telemetry showing 60s-120s bpm. -  Continue home anticoagulation on Eliquis 2.5mg 41mlet po BID. Patient and husband report anticoagulation compliance. - Further cardiac workup pending results of echo.   2. Anemia, likely 2/2 chronic disease - Hgb 11.9 (7/31)  Hgb 10.4 (8/1) - Plt 267 (7/31)  Plt 233 (8/1) - Likely contributing to SOB - Continue to monitor - Per IM  3. Chronic respiratory failure in the setting of COPD - H/o lung infections, including pseudomonas and remote MAI  - Hemoptysis resolved with report of previous similar episode 10 months ago - On 5L [place] at hospice - Wheezing and crackles heard on exam 06/08/18 - Continue IV lasix - SpO2 85%  100% - Continue Nebs - Per IM, pulmonary   For questions or updates, please contact [facility] Please consult [email or web address] for contact info under Cardiology/STEMI.      SigneDorthula NettlesC  Pager [phone], 7:25 AM

## 2018-06-08 NOTE — H&P (Signed)
[facility] NAME: Hannah Kline    MR#:  [id number]  DATE OF BIRTH:  1932/01/26  DATE OF ADMISSION:  06/07/2018  PRIMARY CARE PHYSICIAN: Crecencio Mc, MD   REQUESTING/REFERRING PHYSICIAN:   CHIEF COMPLAINT:   Chief Complaint  Patient presents with  . Epistaxis    HISTORY OF PRESENT ILLNESS: Hannah Kline  is a 84 y.o. female with a known history of chronic atrial fibrillation, on Eliquis, COPD, chronic respiratory failure on 5 L oxygen per nasal cannula at home.  She is enrolled with hospice. Patient presented to emergency room for an episode of coughing blood, at home, approximately 1 hour before arriving to emergency room.  Per husband, patient has been more short of breath today.  No fever or chills. At the arrival to emergency room patient was noted to be hypoxic, with oxygen saturation around 85% on 5 L oxygen per nasal cannula. Blood test done emergency room including CBC and CMP are grossly unremarkable, except for elevated BNP at 371.  Troponin level is less than 0.03. Chest x-ray, reviewed by myself, shows increased basilar reticulations suspicious for pulmonary edema superimposed on chronic lung disease.  EKG, reviewed by myself, shows atrial fibrillation with a rapid ventricular response, rate is 121 bpm, right bundle branch block, left anterior fascicular block, LVH.  No acute ischemic changes. Patient is admitted for further evaluation and treatment.     PAST MEDICAL HISTORY:   Past Medical History:  Diagnosis Date  . Atrial fibrillation ([place])   . Bacterial infection due to Pseudomonas   . Bronchiectasis   . COPD (chronic obstructive pulmonary disease) ([place])    . Cough    from bronchiectasis neb txs  . Dysrhythmia    A Fib  . Hepatitis    h/o INH(isoniazide)  . History of Mycobacterium avium complex infection    lung disease  . Hypothyroidism   . Pleurisy 12/08/11  . Shortness of breath dyspnea      PAST SURGICAL HISTORY:  Past Surgical History:  Procedure Laterality Date  . CATARACT EXTRACTION W/PHACO Left 01/26/2016   Procedure: CATARACT EXTRACTION PHACO AND INTRAOCULAR LENS PLACEMENT (IOC);  Surgeon: Ronnell Freshwater, MD;  Location: [facility];  Service: Ophthalmology;  Laterality: Left;  . COLONOSCOPY    . SHOULDER ARTHROSCOPY     right    SOCIAL HISTORY:  Social History   Tobacco Use  . Smoking status: Never Smoker  . Smokeless tobacco: Never Used  Substance Use Topics  . Alcohol use: Yes    Alcohol/week: 4.2 - 4.8 oz    Types: 7 Glasses of wine per week    FAMILY HISTORY:  Family History  Problem Relation Age of Onset  . Angina Mother   . Other Mother        intestional blockage  . Diabetes Maternal Grandfather     DRUG ALLERGIES:  Allergies  Allergen Reactions  . Ciprofloxacin Other (See Comments)    c-diff  . Isoniazid     Hepatitis, weakness, nausea  . Moxifloxacin     Avelox  REACTION: chills, fainting    REVIEW OF SYSTEMS:   CONSTITUTIONAL: No fever, fatigue or weakness.  EYES: No blurred or double vision.  EARS, NOSE, AND THROAT: No tinnitus or ear pain.  RESPIRATORY: Positive for hemoptysis.  Positive for chronic cough and wheezing. CARDIOVASCULAR: No chest pain, orthopnea, edema.  GASTROINTESTINAL: No nausea, vomiting, diarrhea or abdominal pain.  GENITOURINARY: No  dysuria, hematuria.  ENDOCRINE: No polyuria, nocturia,  HEMATOLOGY: Positive for hemoptysis SKIN: No rash or lesion. MUSCULOSKELETAL: No joint pain.   NEUROLOGIC: No focal weakness.  PSYCHIATRY: No anxiety or depression.   MEDICATIONS AT HOME:  Prior to Admission medications   Medication Sig Start Date End Date Taking? Authorizing Provider  albuterol (PROVENTIL HFA;VENTOLIN HFA) 108 (90 Base) MCG/ACT inhaler Inhale 2 puffs into the lungs every 4 (four) hours as needed for wheezing or shortness of breath. 10/04/17  Yes Collene Gobble, MD  apixaban  (ELIQUIS) 2.5 MG TABS tablet Take 1 tablet (2.5 mg total) by mouth 2 (two) times daily. 05/19/18  Yes Deboraha Sprang, MD  arformoterol [facility]) 15 MCG/2ML NEBU Take 15 mcg by nebulization 2 (two) times daily.   Yes [provider]  budesonide (PULMICORT) 0.5 MG/2ML nebulizer solution Take 0.5 mg by nebulization 2 (two) times daily.   Yes [provider]  [employer] SENNA PLUS 8.6-50 MG tablet TAKE 1-2 TABS BY MOUTH TWICE DAILY 01/13/18  Yes [provider]  diltiazem (CARDIZEM) 30 MG tablet Take 1 tablet (30 mg total) by mouth 3 (three) times daily as needed (atrial fibrillation). 01/03/17  Yes Minna Merritts, MD  dorzolamide (TRUSOPT) 2 % ophthalmic solution Place 1 drop into the left eye 2 (two) times daily. 04/20/17  Yes [provider]  famotidine (PEPCID) 20 MG tablet Take 1 tablet (20 mg total) by mouth 2 (two) times daily. Patient taking differently: Take 20 mg by mouth daily as needed for heartburn.  12/12/17  Yes Crecencio Mc, MD  fexofenadine (ALLEGRA) 180 MG tablet Take 1 tablet (180 mg total) by mouth 4 (four) times daily. Gradual increase Patient taking differently: Take 180 mg by mouth 2 (two) times daily. Gradual increase 12/12/17  Yes Crecencio Mc, MD  levothyroxine (SYNTHROID, LEVOTHROID) 75 MCG tablet Take 1 tablet (75 mcg total) by mouth daily. Patient taking differently: Take 75 mcg by mouth daily before breakfast.  04/06/18  Yes Crecencio Mc, MD  OXYGEN Inhale 5 L into the lungs continuous.    Yes [provider]  propafenone (RYTHMOL) 150 MG tablet TAKE 1 TABLET BY MOUTH TWICE A DAY 05/22/18  Yes Gollan, Kathlene November, MD  torsemide (DEMADEX) 20 MG tablet TAKE 1 TABLET (20 MG TOTAL) BY MOUTH DAILY. AS NEEDED FOR FLUID RETENTION Patient taking differently: Take 20 mg by mouth daily as needed (Only when taking prednisone). As needed for fluid retention 05/16/18  Yes Crecencio Mc, MD  Respiratory Therapy Supplies (FLUTTER) DEVI Use as directed  09/06/17   Parrett, Tammy S, NP      PHYSICAL EXAMINATION:   VITAL SIGNS: Blood pressure 116/61, pulse (!) 116, temperature 97.8 F (36.6 C), temperature source Oral, resp. rate 19, height 5' 8"  (1.727 m), weight 45.4 kg (100 lb), SpO2 97 %.  GENERAL:  84 y.o.-year-old patient lying in the bed with no acute distress, at rest.  EYES: Pupils equal, round, reactive to light and accommodation. No scleral icterus. Extraocular muscles intact.  HEENT: Head atraumatic, normocephalic. Oropharynx and nasopharynx clear.  NECK:  Supple, no jugular venous distention. No thyroid enlargement, no tenderness.  LUNGS: Severely reduced breath sounds and scattered wheezing are heard bilaterally. CARDIOVASCULAR: S1, S2 normal. No murmurs, rubs, or gallops.  ABDOMEN: Soft, nontender, nondistended. Bowel sounds present. No organomegaly or mass.  EXTREMITIES: No pedal edema, cyanosis, or clubbing.  NEUROLOGIC: No focal weakness. PSYCHIATRIC: The patient is alert and oriented x 3.  SKIN:  No obvious rash, lesion, or ulcer.   LABORATORY PANEL:   CBC Recent Labs  Lab 06/05/18 1047 06/07/18 2227  WBC 12.1* 12.5*  HGB 11.2 11.9*  HCT 34.4 35.9  PLT 285 267  MCV 94 97.0  MCH 30.7 32.3  MCHC 32.6 33.2  RDW 12.5 14.4  LYMPHSABS 0.9 0.7*  MONOABS  --  1.1*  EOSABS 0.7* 0.6  BASOSABS 0.1 0.1   ------------------------------------------------------------------------------------------------------------------  Chemistries  Recent Labs  Lab 06/05/18 1047 06/07/18 2227  NA 139 137  K 4.3 3.6  CL 93* 94*  CO2 34* 37*  GLUCOSE 105* 149*  BUN 11 14  CREATININE 0.80 0.64  CALCIUM 8.4* 8.6*   ------------------------------------------------------------------------------------------------------------------ estimated creatinine clearance is 38.2 mL/min (by C-G formula based on SCr of 0.64  mg/dL). ------------------------------------------------------------------------------------------------------------------ No results for input(s): TSH, T4TOTAL, T3FREE, THYROIDAB in the last 72 hours.  Invalid input(s): FREET3   Coagulation profile Recent Labs  Lab 06/07/18 2227  INR 1.00   ------------------------------------------------------------------------------------------------------------------- No results for input(s): DDIMER in the last 72 hours. -------------------------------------------------------------------------------------------------------------------  Cardiac Enzymes Recent Labs  Lab 06/07/18 2227  TROPONINI <0.03   ------------------------------------------------------------------------------------------------------------------ Invalid input(s): POCBNP  ---------------------------------------------------------------------------------------------------------------  Urinalysis    Component Value Date/Time   COLORURINE YELLOW (A) 06/14/2016 1138   APPEARANCEUR CLEAR (A) 06/14/2016 1138   APPEARANCEUR Turbid (A) 11/22/2014 1542   LABSPEC 1.010 06/14/2016 1138   LABSPEC 1.006 11/21/2011 1121   PHURINE 6.0 06/14/2016 1138   GLUCOSEU NEGATIVE 06/14/2016 1138   GLUCOSEU NEGATIVE 07/11/2014 0852   HGBUR NEGATIVE 06/14/2016 1138   BILIRUBINUR NEGATIVE 06/14/2016 1138   BILIRUBINUR Negative 11/22/2014 1542   BILIRUBINUR Negative 11/21/2011 1121   KETONESUR NEGATIVE 06/14/2016 1138   PROTEINUR NEGATIVE 06/14/2016 1138   UROBILINOGEN 0.2 07/11/2014 0852   NITRITE NEGATIVE 06/14/2016 1138   LEUKOCYTESUR NEGATIVE 06/14/2016 1138   LEUKOCYTESUR Negative 11/22/2014 1542   LEUKOCYTESUR Trace 11/21/2011 1121     RADIOLOGY: Dg Chest Port 1 View  Result Date: 06/07/2018 CLINICAL DATA:  Dyspnea. EXAM: PORTABLE CHEST 1 VIEW COMPARISON:  Radiographs 10/01/2017, CT 02/17/2017 FINDINGS: Chronic hyperinflation. Increased interstitial opacities at the lung bases  compared to prior exam, with chronic reticular opacities in the upper lobes unchanged. Slight increased heart size from prior exam, may be accentuated by differences in technique. Aortic atherosclerosis. Chronic blunting of costophrenic angles likely due to hyperinflation. No pneumothorax. IMPRESSION: 1. Increased basilar reticulations suspicious for pulmonary edema superimposed on chronic lung disease. 2. Slight increased heart size since prior radiographs may be cardiomegaly versus differences in technique. Aortic Atherosclerosis (ICD10-I70.0). Electronically Signed   By: Jeb Levering M.D.   On: 06/07/2018 22:39    EKG: Orders placed or performed during the hospital encounter of 06/07/18  . ED EKG  . ED EKG    IMPRESSION AND PLAN:  1.  Acute on chronic respiratory failure with hypoxia, likely secondary to acute pulmonary edema.  We will start treatment with IV Lasix and oxygen therapy. 2.  Acute pulmonary edema.  We will start IV Lasix and oxygen therapy.  Will check 2D echo to reevaluate heart function. 3.  Atrial fibrillation with rapid ventricular response.  Will use Cardizem p.o.  For rate control.  Continue Eliquis for chronic anticoagulation, to prevent stroke. 4.  Hemoptysis, resolved.  Continue to monitor clinically closely.  Hemoglobin level is within normal limits, continue to monitor. 5.  Severe COPD, without exacerbation.  Continue home maintenance therapy including chronic oxygen therapy. 6.  Diastolic CHF, with acute exacerbation.  See  treatment as above under #1.  All the records are reviewed and case discussed with ED provider. Management plans discussed with the patient, who is in agreement.  CODE STATUS: DNR Code Status History    Date Active Date Inactive Code Status Order ID Comments User Context   10/14/2017 1603 06/07/2018 2159 DNR [id number] Discussed at visit Crecencio Mc, MD Outpatient   04/24/2017 1150 04/26/2017 1701 Full Code [id number]  Nicholes Mango, MD  Inpatient    Questions for Most Recent Historical Code Status (Order [id number])    Question Answer Comment   In the event of cardiac or respiratory ARREST Do not call a "code blue"    In the event of cardiac or respiratory ARREST Do not perform Intubation, CPR, defibrillation or ACLS    In the event of cardiac or respiratory ARREST Use medication by any route, position, wound care, and other measures to relive pain and suffering. May use oxygen, suction and manual treatment of airway obstruction as needed for comfort.    Comments patient is now receiving hospice         Advance Directive Documentation     Most Recent Value  Type of Advance Directive  Living will, Healthcare Power of Attorney, Out of facility DNR (pink MOST or yellow form)  Pre-existing out of facility DNR order (yellow form or pink MOST form)  -  "MOST" Form in Place?  -       TOTAL TIME TAKING CARE OF THIS PATIENT: 45 minutes.    Amelia Jo M.D on 06/08/2018 at 1:19 AM  Between 7am to 6pm - Pager - [phone]  After 6pm go to [email or web address] - password [email or web address] [facility]  Office  [phone]  CC: Primary care physician; Crecencio Mc, MD

## 2018-06-08 NOTE — Progress Notes (Addendum)
MD paged to notify of patient's blood pressure's (see flowsheet) and scheduled lasix and cardizem. Per verbal orders by MD, ok to give Cardizem now, recheck blood pressure in one hour and depending on the numbers, give the lasix then. Patient asymptomatic, complaints of nausea decreased with Zofran. Will continue to monitor patient.

## 2018-06-08 NOTE — Progress Notes (Signed)
Decreased to 5l after neb treatment. 

## 2018-06-08 NOTE — Telephone Encounter (Signed)
Note in error.

## 2018-06-08 NOTE — Plan of Care (Signed)

## 2018-06-08 NOTE — Progress Notes (Addendum)
Patient admitted this a.m. for CHF exacerbation -Known history of chronic A. fib and CHF -Chronically on 5 L oxygen at home. -Discussed with cardiologist and the family.  Plan is to continue IV diuresis for now. -Patient did convert briefly to sinus rhythm last night.  Started on scheduled oral Cardizem and changed her propafenone dose.  Continue to monitor closely.  No plans for cardioversion at this time. -Patient is followed by hospice at home.  Hospice liaison updated.

## 2018-06-08 NOTE — Telephone Encounter (Signed)
Will keep message open to place referral next week.

## 2018-06-08 NOTE — Telephone Encounter (Signed)
I left a message on the identified home # for Mr. Kempe (ok per [facility]) that the patient's labs from 3 days ago look ok and that her DCCV is still scheduled for Monday 06/12/18. The patient is currently admitted. I advised him on the voice mail that due to her inpatient status, the date of the DCCV could potentially change, but as of right now, she is scheduled for 06/12/18.

## 2018-06-08 NOTE — ED Notes (Signed)
Pt assisted up to bedside toilet. Tolerated well. Provided for safety and comfort and will continue to assess.

## 2018-06-08 NOTE — Progress Notes (Signed)
Patient is currently followed by hospice and [facility] at home with a hospice diagnosis of COPD. She is a DNR code with out of facility DNR in place in the home. She is on chronic oxygen at 5 liters. Patient came to the [facility] ED last evening for evaluation of epistaxis. [facility] as admitted for CHF. Patient seen sitting up on the side of the bed, attempting to eat lunch. She reported very poor appetite as well as feeling "really bad". She expressed concerns regarding her home medications especially her nebulizer treatments. Staff RN Oris Drone present in the room to address this. Patient also uses liquid morphine concentrate at home for dyspnea. Discussed with Oris Drone and Dr. Tressia Miners , order to be added. Patient was forgetful and also expressed being depressed regarding her current health condition. Emotional support given. Patient does have an appointment for a cardioversion on 8/5. Per conversation with patient and with attending physician Dr. Tressia Miners, this may not take place. Again much emotional support given. Will continue to follow and update hospice team. Flo Shanks RN, BSN, [facility] and [facility] [place], hospital liaison [phone]

## 2018-06-08 NOTE — Progress Notes (Signed)
*  PRELIMINARY RESULTS* Echocardiogram 2D Echocardiogram has been performed.  Hannah Kline 06/08/2018, 9:58 AM

## 2018-06-08 NOTE — Telephone Encounter (Signed)
Called and spoke with Tammy with [facility] at phone [phone] Advised her that BQ is good with sending another referral to hospital when needed Tammy advised pt was admitted to [facility] last night for productive cough-blood, and nose bleed. Pt is weaker, SOB and declined in change in last 4 days.  At this time not sure if the pt will have cardioversion Monday 06/12/18 or not. Tammy will advise.

## 2018-06-09 DIAGNOSIS — J9611 Chronic respiratory failure with hypoxia: Secondary | ICD-10-CM

## 2018-06-09 DIAGNOSIS — I5033 Acute on chronic diastolic (congestive) heart failure: Secondary | ICD-10-CM

## 2018-06-09 DIAGNOSIS — I481 Persistent atrial fibrillation: Secondary | ICD-10-CM

## 2018-06-09 DIAGNOSIS — J471 Bronchiectasis with (acute) exacerbation: Secondary | ICD-10-CM

## 2018-06-09 LAB — BASIC METABOLIC PANEL
Anion gap: 8 (ref 5–15)
BUN: 15 mg/dL (ref 8–23)
CALCIUM: 8.2 mg/dL — AB (ref 8.9–10.3)
CO2: 40 mmol/L — ABNORMAL HIGH (ref 22–32)
CREATININE: 0.74 mg/dL (ref 0.44–1.00)
Chloride: 90 mmol/L — ABNORMAL LOW (ref 98–111)
GFR calc Af Amer: >60 mL/min (ref >60)
Glucose, Bld: 92 mg/dL (ref 70–99)
POTASSIUM: 4 mmol/L (ref 3.5–5.1)
SODIUM: 138 mmol/L (ref 135–145)

## 2018-06-09 LAB — GLUCOSE, CAPILLARY
Glucose-Capillary: 77 mg/dL (ref 70–99)
Glucose-Capillary: 89 mg/dL (ref 70–99)

## 2018-06-09 MED ORDER — MORPHINE SULFATE (CONCENTRATE) 10 MG/0.5ML PO SOLN
5.0000 mg | ORAL | Status: DC | PRN
  Administered 2018-06-09 – 2018-06-12 (×6): 5 mg via ORAL
  Filled 2018-06-09 (×6): qty 1

## 2018-06-09 MED ORDER — FUROSEMIDE 10 MG/ML IJ SOLN
20.0000 mg | Freq: Three times a day (TID) | INTRAMUSCULAR | Status: DC
  Administered 2018-06-09 – 2018-06-12 (×8): 20 mg via INTRAVENOUS
  Filled 2018-06-09 (×9): qty 2

## 2018-06-09 NOTE — Care Management Note (Signed)
Case Management Note  Patient Details  Name: Wilmarie Sparlin MRN: [id number] Date of Birth: 10-06-1932  Subjective/Objective:    Admitted to [facility] with the diagnosis of CHF. Lives with husband Herbie [place] [phone] at [place]. Last seen Dr. Derrel Nip 05/15/18. Followed by Hospice and [place]. Currently on 5 liters oxygen per nasal cannula.                 Action/Plan: Will continue to follow for discharge plans   Expected Discharge Date:                  Expected Discharge Plan:     In-House Referral:   yes  Discharge planning Services     Post Acute Care Choice:    Choice offered to:     DME Arranged:    DME Agency:     HH Arranged:    HH Agency:     Status of Service:     If discussed at [employer] of Stay Meetings, dates discussed:    Additional Comments:  Shelbie Ammons, RN MSN CCM Care Management [phone] 06/09/2018, 12:41 PM

## 2018-06-09 NOTE — Progress Notes (Signed)
Visit made. Patient seen sitting up on the side of the bed, just back from the bedside commode, very short of breath. Patient complains of feeing "very bad" agreed to taking some liquid morphine concentrate for dyspnea. Staff RN Illene Bolus notified. Patient's husband in during visit. Discussed use of liquid morphine more often for symptom management, both agreeable. Emotional support given. Writer spoke with attending physician Dr. Tressia Miners, liquid morphine will change from 5 mg q 4 hrs PRN to q 2 hrs PRN. No discharge planned for today. Hospice team updated.  Flo Shanks RN, BSN, [facility] and [facility] [place], hospital Liaison [phone]

## 2018-06-09 NOTE — Progress Notes (Signed)
Progress Note  Patient Name: Hannah Kline Date of Encounter: 06/09/2018  Primary Cardiologist: Tresa Garter  Subjective   Continued shortness of breath but somewhat improved since admission She has not been ambulating Telemetry showing atrial fibrillation Last run of normal sinus rhythm for several hours 2 nights ago, spontaneously went into normal rhythm 3 AM to 5 AM 2 L negative Past 24 hours  Inpatient Medications    Scheduled Meds: . apixaban  2.5 mg Oral BID  . arformoterol  15 mcg Nebulization BID  . budesonide  0.5 mg Nebulization BID  . diltiazem  30 mg Oral Q6H  . docusate sodium  100 mg Oral BID  . dorzolamide  1 drop Left Eye BID  . furosemide  20 mg Intravenous Q8H  . levothyroxine  75 mcg Oral QAC breakfast  . loratadine  10 mg Oral Daily  . mouth rinse  15 mL Mouth Rinse BID  . potassium chloride  20 mEq Oral Daily  . propafenone  225 mg Oral Q12H  . sodium chloride flush  3 mL Intravenous Q12H   Continuous Infusions:  PRN Meds: acetaminophen **OR** acetaminophen, albuterol, bisacodyl, famotidine, feeding supplement (ENSURE ENLIVE), HYDROcodone-acetaminophen, morphine CONCENTRATE, ondansetron **OR** ondansetron (ZOFRAN) IV, traZODone   Vital Signs    Vitals:   06/09/18 0731 06/09/18 1152 06/09/18 1423 06/09/18 1542  BP: 112/78 110/83 110/65 111/65  Pulse: 83  82 (!) 106  Resp: 18   18  Temp: 97.7 F (36.5 C)   (!) 97.5 F (36.4 C)  TempSrc:    Oral  SpO2: 91%   97%  Weight:      Height:        Intake/Output Summary (Last 24 hours) at 06/09/2018 1742 Last data filed at 06/09/2018 1607 Gross per 24 hour  Intake 363 ml  Output 1750 ml  Net -1387 ml   Filed Weights   06/07/18 2204 06/08/18 0232 06/09/18 0349  Weight: 100 lb (45.4 kg) 104 lb 8 oz (47.4 kg) 97 lb 9.6 oz (44.3 kg)    Telemetry    Atrial fibrillation- Personally Reviewed  ECG      Physical Exam   GEN: mild distress, sitting on the edge of the bed over her bedside  table Neck: No JVD Cardiac:irregularly irregular,  no murmurs, rubs, or gallops.  Respiratory: coarse breath sounds bilaterally GI: Soft, nontender, non-distended  MS: No edema; No deformity. Neuro:  Nonfocal  Psych: Normal affect   Labs    Chemistry Recent Labs  Lab 06/07/18 2227 06/08/18 0525 06/09/18 0547  NA 137 136 138  K 3.6 4.3 4.0  CL 94* 93* 90*  CO2 37* 38* 40*  GLUCOSE 149* 118* 92  BUN 14 13 15   CREATININE 0.64 0.61 0.74  CALCIUM 8.6* 8.1* 8.2*  GFRNONAA >60 >60 >60  GFRAA >60 >60 >60  ANIONGAP 6 5 8      Hematology Recent Labs  Lab 06/05/18 1047 06/07/18 2227 06/08/18 0525  WBC 12.1* 12.5* 10.9  RBC 3.65* 3.70* 3.22*  HGB 11.2 11.9* 10.4*  HCT 34.4 35.9 31.2*  MCV 94 97.0 97.0  MCH 30.7 32.3 32.3  MCHC 32.6 33.2 33.3  RDW 12.5 14.4 14.4  PLT 285 267 233    Cardiac Enzymes Recent Labs  Lab 06/07/18 2227  TROPONINI <0.03   No results for input(s): TROPIPOC in the last 168 hours.   BNP Recent Labs  Lab 06/07/18 2227  BNP 371.0*     DDimer No results for input(s): DDIMER  in the last 168 hours.   Radiology    Dg Chest Port 1 View  Result Date: 06/07/2018 CLINICAL DATA:  Dyspnea. EXAM: PORTABLE CHEST 1 VIEW COMPARISON:  Radiographs 10/01/2017, CT 02/17/2017 FINDINGS: Chronic hyperinflation. Increased interstitial opacities at the lung bases compared to prior exam, with chronic reticular opacities in the upper lobes unchanged. Slight increased heart size from prior exam, may be accentuated by differences in technique. Aortic atherosclerosis. Chronic blunting of costophrenic angles likely due to hyperinflation. No pneumothorax. IMPRESSION: 1. Increased basilar reticulations suspicious for pulmonary edema superimposed on chronic lung disease. 2. Slight increased heart size since prior radiographs may be cardiomegaly versus differences in technique. Aortic Atherosclerosis (ICD10-I70.0). Electronically Signed   By: Jeb Levering M.D.   On:  06/07/2018 22:39    Cardiac Studies     Patient Profile     84 year old woman known to me from clinic with end-stage lung disease chronic respiratory failure on hospice, paroxysmal atrial fibrillation, presenting with hemoptysis, paroxysmal atrial fibrillation  Assessment & Plan    A/P: Paroxysmal atrial fibrillation She converted to normal sinus rhythm for several hours 2 nights ago while inpatient Past 24 hours maintained atrial fibrillation Back on her Rythmol twice a day diltiazem 30 mg 4 times a day  Continue diuresis, echo with pulmonary hypertension secondary to underlying severe lung disease and diastolic heart failure Exacerbated by atrial fibrillation -she has indicated she would like to proceed with cardioversion on Monday as scheduled as outpatient . Given she has paroxysmal, demonstrating several hours of normal sinus rhythm 2 nights ago, uncertain if cardioversion would hold  Chronic respiratory failure and COPD MAI, chronic infections On hospice Followed by pulmonary predominant factor in her respiratory distress  Acute on chronic diastolic CHF 2 L out past 24 hours Lasix up to 20 IV every 8 May need to wean down on the dosing for any climbing BUN and creatinine  Long discussion with her concerning her atrial fibrillation, diastolic heart failure  Total encounter time more than 35 minutes  Greater than 50% was spent in counseling and coordination of care with the patient   For questions or updates, please contact [place] Please consult [email or web address] for contact info under Cardiology/STEMI.      Signed, Ida Rogue, MD  06/09/2018, 5:42 PM

## 2018-06-09 NOTE — Progress Notes (Signed)
Patient is becoming increasingly confused. Patient wants to know where her husband, what the plan is for tonight and when is she going. She repeats these questions every time a  Staff member enters the room. Patient has received lasix and morphine (per patient request) at 2100. Patient has not been able to rest at this time.

## 2018-06-09 NOTE — Plan of Care (Signed)

## 2018-06-09 NOTE — Progress Notes (Signed)
[place] at [place] NAME: Hannah Kline    MR#:  [id number]  DATE OF BIRTH:  [birth year]  SUBJECTIVE:  CHIEF COMPLAINT:   Chief Complaint  Patient presents with  . Epistaxis   -Patient sitting by the side of the bed.  Forgetful this morning. -Complaints of dyspnea.  Sats stable on 5 L home oxygen -Remains in A. fib  REVIEW OF SYSTEMS:  Review of Systems  Constitutional: Positive for malaise/fatigue. Negative for chills and fever.  HENT: Negative for congestion, ear discharge, hearing loss and nosebleeds.   Eyes: Negative for blurred vision.  Respiratory: Positive for shortness of breath. Negative for cough and wheezing.   Cardiovascular: Negative for chest pain and palpitations.  Gastrointestinal: Negative for abdominal pain, constipation, diarrhea, nausea and vomiting.  Genitourinary: Negative for dysuria.  Musculoskeletal: Negative for myalgias.  Neurological: Negative for dizziness, seizures and headaches.  Psychiatric/Behavioral: Negative for depression.    DRUG ALLERGIES:   Allergies  Allergen Reactions  . Ciprofloxacin Other (See Comments)    c-diff  . Isoniazid     Hepatitis, weakness, nausea  . Moxifloxacin     Avelox  REACTION: chills, fainting    VITALS:  Blood pressure 110/83, pulse 83, temperature 97.7 F (36.5 C), resp. rate 18, height 5\' 8"  (1.727 m), weight 44.3 kg (97 lb 9.6 oz), SpO2 91 %.  PHYSICAL EXAMINATION:  Physical Exam  GENERAL:  84 y.o.-year-old thin built and ill nourished patient sitting in the bed with no acute distress.  EYES: Pupils equal, round, reactive to light and accommodation. No scleral icterus. Extraocular muscles intact.  HEENT: Head atraumatic, normocephalic. Oropharynx and nasopharynx clear.  NECK:  Supple, no jugular venous distention. No thyroid enlargement, no tenderness.  LUNGS: scant breath sounds bilaterally, no wheezing, rales,rhonchi or crepitation. No use of accessory  muscles of respiration.  CARDIOVASCULAR: S1, S2 normal. No   rubs, or gallops. 3/6 systolic murmur present ABDOMEN: Soft, nontender, nondistended. Bowel sounds present. No organomegaly or mass.  EXTREMITIES: No pedal edema, cyanosis, or clubbing.  NEUROLOGIC: Cranial nerves II through XII are intact. Muscle strength 5/5 in all extremities. Sensation intact. Gait not checked. Global weakness noted. PSYCHIATRIC: The patient is alert and oriented x 2-3. forgetful.  SKIN: No obvious rash, lesion, or ulcer.    LABORATORY PANEL:   CBC Recent Labs  Lab 06/08/18 0525  WBC 10.9  HGB 10.4*  HCT 31.2*  PLT 233   ------------------------------------------------------------------------------------------------------------------  Chemistries  Recent Labs  Lab 06/09/18 0547  NA 138  K 4.0  CL 90*  CO2 40*  GLUCOSE 92  BUN 15  CREATININE 0.74  CALCIUM 8.2*   ------------------------------------------------------------------------------------------------------------------  Cardiac Enzymes Recent Labs  Lab 06/07/18 2227  TROPONINI <0.03   ------------------------------------------------------------------------------------------------------------------  RADIOLOGY:  Dg Chest Port 1 View  Result Date: 06/07/2018 CLINICAL DATA:  Dyspnea. EXAM: PORTABLE CHEST 1 VIEW COMPARISON:  Radiographs 10/01/2017, CT 02/17/2017 FINDINGS: Chronic hyperinflation. Increased interstitial opacities at the lung bases compared to prior exam, with chronic reticular opacities in the upper lobes unchanged. Slight increased heart size from prior exam, may be accentuated by differences in technique. Aortic atherosclerosis. Chronic blunting of costophrenic angles likely due to hyperinflation. No pneumothorax. IMPRESSION: 1. Increased basilar reticulations suspicious for pulmonary edema superimposed on chronic lung disease. 2. Slight increased heart size since prior radiographs may be cardiomegaly versus differences  in technique. Aortic Atherosclerosis (ICD10-I70.0). Electronically Signed   By: Jeb Levering M.D.   On: 06/07/2018 22:39  EKG:   Orders placed or performed during the hospital encounter of 06/07/18  . ED EKG  . ED EKG    ASSESSMENT AND PLAN:   84 year old female with end-stage COPD, chronic respiratory failure on 5 L home oxygen, paroxysmal atrial fibrillation on Eliquis, hypothyroidism who is followed by hospice at home was brought in secondary to worsening shortness of breath.  1.  Acute on chronic diastolic heart failure- -Continue diuresis with IV Lasix. -Appreciate cardiology consult. -Echocardiogram with EF of 55 to 60% and no regional wall motion abnormalities.  Pulmonary artery pressures are significantly elevated  2.  Paroxysmal atrial fibrillation-currently in A. fib. -Propafenone dose adjusted.  Low-dose Cardizem added 4 times a day -Appreciate cardiology consult. -On anticoagulation with Eliquis -Plan for cardioversion if no improvement  3.  Chronic respiratory failure-secondary to COPD and also history of chronic respiratory infections -On 5 L oxygen.  Feels short of breath symptomatically -Roxanol as needed.  Nebs and inhalers -Continue hospice as outpatient  4.  Hypothyroidism-continue Synthroid  5.  DVT prophylaxis-already on Eliquis   All the records are reviewed and case discussed with Care Management/Social Workerr. Management plans discussed with the patient, family and they are in agreement.  CODE STATUS: DNR  TOTAL TIME TAKING CARE OF THIS PATIENT: 38 minutes.   POSSIBLE D/C IN 2-3 DAYS, DEPENDING ON CLINICAL CONDITION.   Eisa Necaise M.D on 06/09/2018 at 1:44 PM  Between 7am to 6pm - Pager - [phone]  After 6pm go to [email or web address] - password [email or web address] [facility]  Office  [phone]  CC: Primary care physician; Crecencio Mc, MD

## 2018-06-09 NOTE — Plan of Care (Signed)
Patient is impulsive due to urination frequency. Patient does not remembers instructions.

## 2018-06-10 LAB — GLUCOSE, CAPILLARY: Glucose-Capillary: 96 mg/dL (ref 70–99)

## 2018-06-10 LAB — BASIC METABOLIC PANEL
Anion gap: 6 (ref 5–15)
BUN: 20 mg/dL (ref 8–23)
CALCIUM: 8.4 mg/dL — AB (ref 8.9–10.3)
CO2: 45 mmol/L — AB (ref 22–32)
CREATININE: 0.85 mg/dL (ref 0.44–1.00)
Chloride: 86 mmol/L — ABNORMAL LOW (ref 98–111)
GFR calc Af Amer: >60 mL/min (ref >60)
GFR calc non Af Amer: >60 mL/min (ref >60)
Glucose, Bld: 98 mg/dL (ref 70–99)
Potassium: 3.9 mmol/L (ref 3.5–5.1)
Sodium: 137 mmol/L (ref 135–145)

## 2018-06-10 MED ORDER — ENSURE ENLIVE PO LIQD
237.0000 mL | Freq: Two times a day (BID) | ORAL | Status: DC
  Administered 2018-06-10: 237 mL via ORAL

## 2018-06-10 MED ORDER — DILTIAZEM HCL ER COATED BEADS 120 MG PO CP24
120.0000 mg | ORAL_CAPSULE | Freq: Every day | ORAL | Status: DC
  Administered 2018-06-10 – 2018-06-12 (×3): 120 mg via ORAL
  Filled 2018-06-10 (×3): qty 1

## 2018-06-10 NOTE — Plan of Care (Signed)
Patient continue to request morphine for dyspnea. Pulse ox is WNL.

## 2018-06-10 NOTE — Progress Notes (Signed)
Progress Note   Subjective   She has ongoing issues with SOB.  + wheezing.  Receiving morphine by primary team for SOB.  Remains in afib.  Inpatient Medications    Scheduled Meds: . apixaban  2.5 mg Oral BID  . arformoterol  15 mcg Nebulization BID  . budesonide  0.5 mg Nebulization BID  . diltiazem  120 mg Oral Daily  . docusate sodium  100 mg Oral BID  . dorzolamide  1 drop Left Eye BID  . feeding supplement (ENSURE ENLIVE)  237 mL Oral BID BM  . furosemide  20 mg Intravenous Q8H  . levothyroxine  75 mcg Oral QAC breakfast  . loratadine  10 mg Oral Daily  . mouth rinse  15 mL Mouth Rinse BID  . potassium chloride  20 mEq Oral Daily  . propafenone  225 mg Oral Q12H  . sodium chloride flush  3 mL Intravenous Q12H   Continuous Infusions:  PRN Meds: acetaminophen **OR** acetaminophen, albuterol, bisacodyl, famotidine, feeding supplement (ENSURE ENLIVE), HYDROcodone-acetaminophen, morphine CONCENTRATE, ondansetron **OR** ondansetron (ZOFRAN) IV, traZODone   Vital Signs    Vitals:   06/10/18 0534 06/10/18 0604 06/10/18 0747 06/10/18 1238  BP: (!) 90/49 129/88 103/73 117/67  Pulse: (!) 111 (!) 101 (!) 105   Resp: 18     Temp: 98 F (36.7 C)  98 F (36.7 C)   TempSrc:   Oral   SpO2: 91%  98%   Weight:      Height:        Intake/Output Summary (Last 24 hours) at 06/10/2018 1247 Last data filed at 06/10/2018 1039 Gross per 24 hour  Intake 243 ml  Output 1550 ml  Net -1307 ml   Filed Weights   06/08/18 0232 06/09/18 0349 06/10/18 0500  Weight: 104 lb 8 oz (47.4 kg) 97 lb 9.6 oz (44.3 kg) 100 lb (45.4 kg)    Telemetry    Afib, V rates controlled - Personally Reviewed  Physical Exam   GEN- The patient is thin and elderly appearing, alert and oriented x 3 today.   Head- normocephalic, atraumatic Eyes-  Sclera clear, conjunctiva pink Ears- hearing intact Oropharynx- clear Neck- supple, Lungs- diffuse expiratory wheezes, prolonged expiratory phase, bibasilar  rales, normal work of breathing Heart- irregular rate and rhythm  GI- soft, NT, ND, + BS Extremities- no edema  MS diffuse atrophy Skin- no rash or lesion Psych- euthymic mood, full affect Neuro- strength and sensation are intact   Labs    Chemistry Recent Labs  Lab 06/08/18 0525 06/09/18 0547 06/10/18 0522  NA 136 138 137  K 4.3 4.0 3.9  CL 93* 90* 86*  CO2 38* 40* 45*  GLUCOSE 118* 92 98  BUN 13 15 20   CREATININE 0.61 0.74 0.85  CALCIUM 8.1* 8.2* 8.4*  GFRNONAA >60 >60 >60  GFRAA >60 >60 >60  ANIONGAP 5 8 6      Hematology Recent Labs  Lab 06/05/18 1047 06/07/18 2227 06/08/18 0525  WBC 12.1* 12.5* 10.9  RBC 3.65* 3.70* 3.22*  HGB 11.2 11.9* 10.4*  HCT 34.4 35.9 31.2*  MCV 94 97.0 97.0  MCH 30.7 32.3 32.3  MCHC 32.6 33.2 33.3  RDW 12.5 14.4 14.4  PLT 285 267 233    Cardiac Enzymes Recent Labs  Lab 06/07/18 2227  TROPONINI <0.03   No results for input(s): TROPIPOC in the last 168 hours.      Patient Profile     84 year old woman known to me from  clinic with end-stage lung disease chronic respiratory failure on hospice, paroxysmal atrial fibrillation, presenting with hemoptysis, paroxysmal atrial fibrillation  Assessment & Plan    1.  Persistent afib Remains in afib Rate controlled Will convert short acting diltiazem to daily Continue on eliquis Plans for cardioversion noted I am not optimistic that she will remain in sinus rhythm. Our medical options are limited.  Could consider tikosyn or amiodarone as options, though I worry about amiodarone given her severe lung disease.  2. Acute on chronic hypercarbic respiratory failure/ COPD/ MAI infections. Poor prognosis In hospice Primary team to manage  3. Acute on chronic diastolic dysfunction Continue IV lasix for now  Thompson Grayer MD, [facility] 06/10/2018 12:47 PM

## 2018-06-10 NOTE — Progress Notes (Signed)
[place] at [place] NAME: Hannah Kline    MR#:  [id number]  DATE OF BIRTH:  1932/03/04  SUBJECTIVE:  CHIEF COMPLAINT:   Chief Complaint  Patient presents with  . Epistaxis   -Patient does not feel well at all.  Has been persistently dyspneic in spite of for 5 L oxygen. -Remains in A. fib, heart rate ranging from 90-110  REVIEW OF SYSTEMS:  Review of Systems  Constitutional: Positive for malaise/fatigue. Negative for chills and fever.  HENT: Negative for congestion, ear discharge, hearing loss and nosebleeds.   Eyes: Negative for blurred vision.  Respiratory: Positive for shortness of breath. Negative for cough and wheezing.   Cardiovascular: Negative for chest pain and palpitations.  Gastrointestinal: Negative for abdominal pain, constipation, diarrhea, nausea and vomiting.  Genitourinary: Negative for dysuria.  Musculoskeletal: Negative for myalgias.  Neurological: Negative for dizziness, seizures and headaches.  Psychiatric/Behavioral: Negative for depression.    DRUG ALLERGIES:   Allergies  Allergen Reactions  . Ciprofloxacin Other (See Comments)    c-diff  . Isoniazid     Hepatitis, weakness, nausea  . Moxifloxacin     Avelox  REACTION: chills, fainting    VITALS:  Blood pressure 103/73, pulse (!) 105, temperature 98 F (36.7 C), temperature source Oral, resp. rate 18, height 5\' 8"  (1.727 m), weight 45.4 kg (100 lb), SpO2 98 %.  PHYSICAL EXAMINATION:  Physical Exam  GENERAL:  84 y.o.-year-old thin built and ill nourished patient sitting in the bed with no acute distress.  EYES: Pupils equal, round, reactive to light and accommodation. No scleral icterus. Extraocular muscles intact.  HEENT: Head atraumatic, normocephalic. Oropharynx and nasopharynx clear.  NECK:  Supple, no jugular venous distention. No thyroid enlargement, no tenderness.  LUNGS: scant breath sounds bilaterally, no wheezing, rales,rhonchi or  crepitation. Using accessory muscles of respiration with minimal exertion.  CARDIOVASCULAR: S1, S2 normal. No   rubs, or gallops. 3/6 systolic murmur present ABDOMEN: Soft, nontender, nondistended. Bowel sounds present. No organomegaly or mass.  EXTREMITIES: No pedal edema, cyanosis, or clubbing.  NEUROLOGIC: Cranial nerves II through XII are intact. Muscle strength 5/5 in all extremities. Sensation intact. Gait not checked. Global weakness noted. PSYCHIATRIC: The patient is alert and oriented x 2-3. forgetful.  SKIN: No obvious rash, lesion, or ulcer.    LABORATORY PANEL:   CBC Recent Labs  Lab 06/08/18 0525  WBC 10.9  HGB 10.4*  HCT 31.2*  PLT 233   ------------------------------------------------------------------------------------------------------------------  Chemistries  Recent Labs  Lab 06/10/18 0522  NA 137  K 3.9  CL 86*  CO2 45*  GLUCOSE 98  BUN 20  CREATININE 0.85  CALCIUM 8.4*   ------------------------------------------------------------------------------------------------------------------  Cardiac Enzymes Recent Labs  Lab 06/07/18 2227  TROPONINI <0.03   ------------------------------------------------------------------------------------------------------------------  RADIOLOGY:  No results found.  EKG:   Orders placed or performed during the hospital encounter of 06/07/18  . ED EKG  . ED EKG    ASSESSMENT AND PLAN:   84 year old female with end-stage COPD, chronic respiratory failure on 5 L home oxygen, paroxysmal atrial fibrillation on Eliquis, hypothyroidism who is followed by hospice at home was brought in secondary to worsening shortness of breath.  1.  Acute on chronic diastolic heart failure- -Continue diuresis with IV Lasix.  BUN and creatinine stable for now, slow rise noted -Appreciate cardiology consult. -Echocardiogram with EF of 55 to 60% and no regional wall motion abnormalities.  Pulmonary artery pressures are significantly  elevated  2.  Paroxysmal atrial fibrillation-currently in A. fib. -Propafenone dose adjusted.  Low-dose Cardizem added 4 times a day -Appreciate cardiology consult. -On anticoagulation with Eliquis -Plan for cardioversion on Monday if no improvement  3.  Chronic respiratory failure-secondary to COPD and also history of chronic respiratory infections -On 5 L oxygen.  Feels short of breath symptomatically -Roxanol as needed.  Nebs and inhalers -Continue hospice as outpatient  4.  Hypothyroidism-continue Synthroid  5.  DVT prophylaxis-already on Eliquis   Discussed with husband at bedside as well   All the records are reviewed and case discussed with Care Management/Social Workerr. Management plans discussed with the patient, family and they are in agreement.  CODE STATUS: DNR  TOTAL TIME TAKING CARE OF THIS PATIENT: 36 minutes.   POSSIBLE D/C IN 2-3 DAYS, DEPENDING ON CLINICAL CONDITION.   Loye Vento M.D on 06/10/2018 at 12:14 PM  Between 7am to 6pm - Pager - [phone]  After 6pm go to [email or web address] - password [email or web address] [facility]  Office  [phone]  CC: Primary care physician; Crecencio Mc, MD

## 2018-06-10 NOTE — Progress Notes (Signed)
CCMD called to notify patient converted to NSR on tele monitor around 1300. Patient in bed, complaining of some nausea. PRN medication already given for nausea. Patient denies any pain. Dr. Tressia Miners notified. Advised to monitor patient and continue current medication regimen. Will continue to monitor patient.

## 2018-06-10 NOTE — Plan of Care (Signed)

## 2018-06-11 LAB — BASIC METABOLIC PANEL
Anion gap: 8 (ref 5–15)
BUN: 21 mg/dL (ref 8–23)
CO2: 46 mmol/L — ABNORMAL HIGH (ref 22–32)
CREATININE: 0.78 mg/dL (ref 0.44–1.00)
Calcium: 8.4 mg/dL — ABNORMAL LOW (ref 8.9–10.3)
Chloride: 84 mmol/L — ABNORMAL LOW (ref 98–111)
Glucose, Bld: 98 mg/dL (ref 70–99)
POTASSIUM: 3.8 mmol/L (ref 3.5–5.1)
SODIUM: 138 mmol/L (ref 135–145)

## 2018-06-11 LAB — GLUCOSE, CAPILLARY: Glucose-Capillary: 82 mg/dL (ref 70–99)

## 2018-06-11 LAB — MAGNESIUM: Magnesium: 1.9 mg/dL (ref 1.7–2.4)

## 2018-06-11 MED ORDER — POLYETHYLENE GLYCOL 3350 17 G PO PACK
17.0000 g | PACK | Freq: Every day | ORAL | Status: DC
  Administered 2018-06-11 – 2018-06-12 (×2): 17 g via ORAL
  Filled 2018-06-11 (×2): qty 1

## 2018-06-11 MED ORDER — BISACODYL 5 MG PO TBEC
10.0000 mg | DELAYED_RELEASE_TABLET | Freq: Every day | ORAL | Status: DC | PRN
  Filled 2018-06-11: qty 2

## 2018-06-11 MED ORDER — FOLIC ACID 1 MG PO TABS
1.0000 mg | ORAL_TABLET | Freq: Every day | ORAL | Status: DC
  Administered 2018-06-11 – 2018-06-12 (×2): 1 mg via ORAL
  Filled 2018-06-11 (×2): qty 1

## 2018-06-11 MED ORDER — PROMETHAZINE HCL 25 MG/ML IJ SOLN: 12.5000 mg | Freq: Four times a day (QID) | INTRAMUSCULAR | Status: DC | PRN

## 2018-06-11 MED ORDER — ONDANSETRON HCL 4 MG/2ML IJ SOLN
4.0000 mg | Freq: Four times a day (QID) | INTRAMUSCULAR | Status: DC
  Administered 2018-06-11 – 2018-06-12 (×2): 4 mg via INTRAVENOUS
  Filled 2018-06-11 (×2): qty 2

## 2018-06-11 NOTE — Progress Notes (Signed)
Progress Note   Subjective   Remains SOB.  No new issues.  Inpatient Medications    Scheduled Meds: . apixaban  2.5 mg Oral BID  . arformoterol  15 mcg Nebulization BID  . budesonide  0.5 mg Nebulization BID  . diltiazem  120 mg Oral Daily  . docusate sodium  100 mg Oral BID  . dorzolamide  1 drop Left Eye BID  . feeding supplement (ENSURE ENLIVE)  237 mL Oral BID BM  . folic acid  1 mg Oral Daily  . furosemide  20 mg Intravenous Q8H  . levothyroxine  75 mcg Oral QAC breakfast  . loratadine  10 mg Oral Daily  . mouth rinse  15 mL Mouth Rinse BID  . ondansetron (ZOFRAN) IV  4 mg Intravenous Q6H  . polyethylene glycol  17 g Oral Daily  . potassium chloride  20 mEq Oral Daily  . propafenone  225 mg Oral Q12H  . sodium chloride flush  3 mL Intravenous Q12H   Continuous Infusions:  PRN Meds: acetaminophen **OR** acetaminophen, albuterol, bisacodyl, famotidine, feeding supplement (ENSURE ENLIVE), HYDROcodone-acetaminophen, morphine CONCENTRATE, promethazine, traZODone   Vital Signs    Vitals:   06/10/18 1924 06/10/18 1944 06/11/18 0432 06/11/18 0757  BP: (!) 108/57  (!) 99/56 (!) 115/51  Pulse: 95  (!) 139 95  Resp:    20  Temp: 98.1 F (36.7 C)  97.8 F (36.6 C) 97.8 F (36.6 C)  TempSrc: Oral  Oral Oral  SpO2: 98% 92% 92% 97%  Weight:   97 lb 14.4 oz (44.4 kg)   Height:        Intake/Output Summary (Last 24 hours) at 06/11/2018 1515 Last data filed at 06/11/2018 1406 Gross per 24 hour  Intake 960 ml  Output 1400 ml  Net -440 ml   Filed Weights   06/09/18 0349 06/10/18 0500 06/11/18 0432  Weight: 97 lb 9.6 oz (44.3 kg) 100 lb (45.4 kg) 97 lb 14.4 oz (44.4 kg)    Telemetry    afib has organized into atypical atrial flutter.  She is NOT is sinus - Personally Reviewed  Physical Exam   GEN- The patient is elderly, frail , chronically ill appearing, alert and oriented x 3 today.   Head- normocephalic, atraumatic Eyes-  Sclera clear, conjunctiva pink Ears-  hearing intact Oropharynx- clear Neck- supple, Lungs- coarse BS throughout, diffuse expiratory wheezes with prolonged expiratory phase, normal work of breathing Heart- irregular rate and rhythm  GI- soft, NT, ND, + BS Extremities- no  edema  MS- diffuse atrophy Skin- no rash or lesion Psych- euthymic mood, full affect Neuro- strength and sensation are intact   Labs    Chemistry Recent Labs  Lab 06/09/18 0547 06/10/18 0522 06/11/18 0442  NA 138 137 138  K 4.0 3.9 3.8  CL 90* 86* 84*  CO2 40* 45* 46*  GLUCOSE 92 98 98  BUN 15 20 21   CREATININE 0.74 0.85 0.78  CALCIUM 8.2* 8.4* 8.4*  GFRNONAA >60 >60 >60  GFRAA >60 >60 >60  ANIONGAP 8 6 8      Hematology Recent Labs  Lab 06/05/18 1047 06/07/18 2227 06/08/18 0525  WBC 12.1* 12.5* 10.9  RBC 3.65* 3.70* 3.22*  HGB 11.2 11.9* 10.4*  HCT 34.4 35.9 31.2*  MCV 94 97.0 97.0  MCH 30.7 32.3 32.3  MCHC 32.6 33.2 33.3  RDW 12.5 14.4 14.4  PLT 285 267 233    Cardiac Enzymes Recent Labs  Lab 06/07/18 2227  TROPONINI <0.03   No results for input(s): TROPIPOC in the last 168 hours.     Patient Profile   84 year old woman known to me from clinic with end-stage lung disease chronic respiratory failure on hospice, presenting with hemoptysis, persistent atrial fibrillation.   Assessment & Plan    1.  Persistent afib Though notes from nursing and IM suggest sinus, she is NOT is sinus rhythm.  She is in atypical atrial flutter.  I do not see any sinus rhythm overnight. Will make NPO for cardioversion tomorrow with Dr Rockey Situ. Continue eliquis. I am not optimistic that she will maintain sinus.  Ultimately, she may require a rate control strategy.  Though we could consider tikosyn or amiodarone, I do not think that these will improve her overall condition or change her clinical coarse in a significant way.  Her AF is likely driven by her severe lung disease.  2. Acute on chronic diastolic dysfunction Continue IV  diuresis.  3. Acute on chronic hypercarbid respiratory failure/ COPD/ MAI infections Very poor prognosis She is in hospice.   Would continue to focus on comfort.  Thompson Grayer MD, [facility] 06/11/2018 3:15 PM

## 2018-06-11 NOTE — Progress Notes (Signed)
[place] at [place] NAME: Hannah Kline    MR#:  [id number]  DATE OF BIRTH:  1932-10-15  SUBJECTIVE:  CHIEF COMPLAINT:   Chief Complaint  Patient presents with  . Epistaxis   -Still significantly short of breath.  Converted to sinus rhythm -Complains of nausea this morning  REVIEW OF SYSTEMS:  Review of Systems  Constitutional: Positive for malaise/fatigue. Negative for chills and fever.  HENT: Negative for congestion, ear discharge, hearing loss and nosebleeds.   Eyes: Negative for blurred vision.  Respiratory: Positive for shortness of breath. Negative for cough and wheezing.   Cardiovascular: Negative for chest pain and palpitations.  Gastrointestinal: Negative for abdominal pain, constipation, diarrhea, nausea and vomiting.  Genitourinary: Negative for dysuria.  Musculoskeletal: Negative for myalgias.  Neurological: Negative for dizziness, seizures and headaches.  Psychiatric/Behavioral: Negative for depression.    DRUG ALLERGIES:   Allergies  Allergen Reactions  . Ciprofloxacin Other (See Comments)    c-diff  . Isoniazid     Hepatitis, weakness, nausea  . Moxifloxacin     Avelox  REACTION: chills, fainting    VITALS:  Blood pressure (!) 115/51, pulse 95, temperature 97.8 F (36.6 C), temperature source Oral, resp. rate 20, height 5\' 8"  (1.727 m), weight 44.4 kg (97 lb 14.4 oz), SpO2 97 %.  PHYSICAL EXAMINATION:  Physical Exam  GENERAL:  84 y.o.-year-old thin built and ill nourished patient sitting in the bed with dyspnea which is chronic.  EYES: Pupils equal, round, reactive to light and accommodation. No scleral icterus. Extraocular muscles intact.  HEENT: Head atraumatic, normocephalic. Oropharynx and nasopharynx clear.  NECK:  Supple, no jugular venous distention. No thyroid enlargement, no tenderness.  LUNGS: scant breath sounds bilaterally, no wheezing, rales,rhonchi or crepitation. Using accessory muscles of  respiration with minimal exertion.  CARDIOVASCULAR: S1, S2 normal. No   rubs, or gallops. 3/6 systolic murmur present ABDOMEN: Soft, nontender, nondistended. Bowel sounds present. No organomegaly or mass.  EXTREMITIES: No pedal edema, cyanosis, or clubbing.  NEUROLOGIC: Cranial nerves II through XII are intact. Muscle strength 5/5 in all extremities. Sensation intact. Gait not checked. Global weakness noted. PSYCHIATRIC: The patient is alert and oriented x 2-3. forgetful.  SKIN: No obvious rash, lesion, or ulcer.    LABORATORY PANEL:   CBC Recent Labs  Lab 06/08/18 0525  WBC 10.9  HGB 10.4*  HCT 31.2*  PLT 233   ------------------------------------------------------------------------------------------------------------------  Chemistries  Recent Labs  Lab 06/11/18 0442  NA 138  K 3.8  CL 84*  CO2 46*  GLUCOSE 98  BUN 21  CREATININE 0.78  CALCIUM 8.4*  MG 1.9   ------------------------------------------------------------------------------------------------------------------  Cardiac Enzymes Recent Labs  Lab 06/07/18 2227  TROPONINI <0.03   ------------------------------------------------------------------------------------------------------------------  RADIOLOGY:  No results found.  EKG:   Orders placed or performed during the hospital encounter of 06/07/18  . ED EKG  . ED EKG    ASSESSMENT AND PLAN:   84 year old female with end-stage COPD, chronic respiratory failure on 5 L home oxygen, paroxysmal atrial fibrillation on Eliquis, hypothyroidism who is followed by hospice at home was brought in secondary to worsening shortness of breath.  1.  Acute on chronic diastolic heart failure- -Continue diuresis with IV Lasix.  BUN and creatinine stable for now, slow rise noted -Appreciate cardiology consult. -Echocardiogram with EF of 55 to 60% and no regional wall motion abnormalities.  Pulmonary artery pressures are significantly elevated  2.  Paroxysmal  atrial fibrillation-currently in A.  fib. -Propafenone dose adjusted.  Low-dose Cardizem added 4 times a day -Appreciate cardiology consult. -On anticoagulation with Eliquis -Converted to sinus rhythm yesterday.  3.  Chronic respiratory failure-secondary to COPD and also history of chronic respiratory infections -On 5 L oxygen.  Feels short of breath symptomatically -Roxanol as needed.  Nebs and inhalers -Continue hospice as outpatient  4.  Hypothyroidism-continue Synthroid  5.  DVT prophylaxis-already on Eliquis  6.  Constipation-meds added.  Likely cause of nausea.  Monitor closely   Discussed with daughter at bedside as well   All the records are reviewed and case discussed with Care Management/Social Workerr. Management plans discussed with the patient, family and they are in agreement.  CODE STATUS: DNR  TOTAL TIME TAKING CARE OF THIS PATIENT: 36 minutes.   POSSIBLE D/C IN 2-3 DAYS, DEPENDING ON CLINICAL CONDITION.   Qianna Clagett M.D on 06/11/2018 at 1:01 PM  Between 7am to 6pm - Pager - [phone]  After 6pm go to [email or web address] - password [email or web address] [facility]  Office  [phone]  CC: Primary care physician; Crecencio Mc, MD

## 2018-06-12 LAB — CBC
HCT: 33.3 % — ABNORMAL LOW (ref 35.0–47.0)
HEMOGLOBIN: 11.6 g/dL — AB (ref 12.0–16.0)
MCH: 33.3 pg (ref 26.0–34.0)
MCHC: 34.8 g/dL (ref 32.0–36.0)
MCV: 95.7 fL (ref 80.0–100.0)
PLATELETS: 259 10*3/uL (ref 150–440)
RBC: 3.48 MIL/uL — ABNORMAL LOW (ref 3.80–5.20)
RDW: 14.2 % (ref 11.5–14.5)
WBC: 9.4 10*3/uL (ref 3.6–11.0)

## 2018-06-12 LAB — BASIC METABOLIC PANEL
Anion gap: 10 (ref 5–15)
BUN: 22 mg/dL (ref 8–23)
CALCIUM: 8.7 mg/dL — AB (ref 8.9–10.3)
CHLORIDE: 82 mmol/L — AB (ref 98–111)
CO2: 44 mmol/L — ABNORMAL HIGH (ref 22–32)
CREATININE: 0.84 mg/dL (ref 0.44–1.00)
GFR calc non Af Amer: >60 mL/min (ref >60)
Glucose, Bld: 111 mg/dL — ABNORMAL HIGH (ref 70–99)
Potassium: 4 mmol/L (ref 3.5–5.1)
SODIUM: 136 mmol/L (ref 135–145)

## 2018-06-12 LAB — MAGNESIUM: Magnesium: 2 mg/dL (ref 1.7–2.4)

## 2018-06-12 LAB — GLUCOSE, CAPILLARY: Glucose-Capillary: 90 mg/dL (ref 70–99)

## 2018-06-12 SURGERY — CARDIOVERSION (CATH LAB)
Anesthesia: General

## 2018-06-12 MED ORDER — DILTIAZEM HCL ER COATED BEADS 120 MG PO CP24: 120.0000 mg | ORAL_CAPSULE | Freq: Every day | ORAL | 2 refills | Status: AC

## 2018-06-12 MED ORDER — POLYETHYLENE GLYCOL 3350 17 G PO PACK: 17.0000 g | PACK | Freq: Every day | ORAL | 0 refills | Status: AC | PRN

## 2018-06-12 MED ORDER — MORPHINE SULFATE (CONCENTRATE) 10 MG/0.5ML PO SOLN: 5.0000 mg | ORAL | 0 refills | Status: AC | PRN

## 2018-06-12 MED ORDER — PROPAFENONE HCL 225 MG PO TABS: 225.0000 mg | ORAL_TABLET | Freq: Two times a day (BID) | ORAL | 2 refills | Status: AC

## 2018-06-12 MED ORDER — FEXOFENADINE HCL 180 MG PO TABS: 180.0000 mg | ORAL_TABLET | Freq: Two times a day (BID) | ORAL | 0 refills | Status: AC

## 2018-06-12 MED ORDER — TORSEMIDE 20 MG PO TABS: 20.0000 mg | ORAL_TABLET | Freq: Every day | ORAL | 2 refills | Status: AC

## 2018-06-12 MED ORDER — TORSEMIDE 20 MG PO TABS: 20.0000 mg | ORAL_TABLET | Freq: Every day | ORAL | Status: DC

## 2018-06-12 NOTE — Care Management Important Message (Signed)
Copy of signed IM left with patient and family in room. 

## 2018-06-12 NOTE — Discharge Summary (Signed)
[facility] [place] NAME: Hannah Kline    MR#:  [id number]  DATE OF BIRTH:  05-Aug-1932  DATE OF ADMISSION:  06/07/2018   ADMITTING PHYSICIAN: Amelia Jo, MD  DATE OF DISCHARGE:  06/12/18  PRIMARY CARE PHYSICIAN: Crecencio Mc, MD   ADMISSION DIAGNOSIS:   Epistaxis [R04.0] Hypoxia [R09.02] Acute on chronic systolic congestive heart failure ([place]) [I50.23]  DISCHARGE DIAGNOSIS:   Active Problems:   Atrial fibrillation (HCC)   Bronchiectasis with acute exacerbation (HCC)   Shortness of breath   Respiratory failure, chronic (HCC)   Acute on chronic diastolic CHF (congestive heart failure) (HCC)   Protein-calorie malnutrition, severe   SECONDARY DIAGNOSIS:   Past Medical History:  Diagnosis Date  . Atrial fibrillation ([place])   . Bacterial infection due to Pseudomonas   . Bronchiectasis   . COPD (chronic obstructive pulmonary disease) ([place])    . Cough    from bronchiectasis neb txs  . Dysrhythmia    A Fib  . Hepatitis    h/o INH(isoniazide)  . History of Mycobacterium avium complex infection    lung disease  . Hypothyroidism   . Pleurisy 12/08/11  . Shortness of breath dyspnea     HOSPITAL COURSE:   84 year old female with end-stage COPD, chronic respiratory failure on 5 L home oxygen, paroxysmal atrial fibrillation on Eliquis, hypothyroidism who is followed by hospice at home was brought in secondary to worsening shortness of breath.  1.  Acute on chronic diastolic heart failure- -received IV diuresis with Lasix.    Torsemide, patient was taking as needed at home. -Breathing is about the same as patient has chronic pulmonary fibrosis and on 5 L oxygen. -Appreciate cardiology consult. -Echocardiogram with EF of 55 to 60% and no regional wall motion abnormalities.  Pulmonary artery pressures are significantly elevated  2.  Paroxysmal atrial fibrillation-converted to sinus rhythm now.. -Propafenone dose  adjusted/creased in the hospital.   -Soo long-acting Cardizem added.  Patient on PRN low-dose Cardizem at home which we will continue.  -No Plans for cardioversion at this time.-Appreciate cardiology consult. -On anticoagulation with Eliquis  3.  Chronic respiratory failure-secondary to COPD and also history of chronic respiratory infections -On 5 L oxygen.  Feels short of breath symptomatically -Roxanol as needed.  Nebs and inhalers -Continue hospice as outpatient  4.  Hypothyroidism-continue Synthroid  5.  Constipation - prn meds   Discussed with husband at bedside and also discussed with cardiology team    DISCHARGE CONDITIONS:   Guarded  CONSULTS OBTAINED:   Treatment Team:  Deboraha Sprang, MD Corey Skains, MD  DRUG ALLERGIES:   Allergies  Allergen Reactions  . Ciprofloxacin Other (See Comments)    c-diff  . Isoniazid     Hepatitis, weakness, nausea  . Moxifloxacin     Avelox  REACTION: chills, fainting   DISCHARGE MEDICATIONS:   Allergies as of 06/12/2018      Reactions   Ciprofloxacin Other (See Comments)   c-diff   Isoniazid    Hepatitis, weakness, nausea   Moxifloxacin    Avelox  REACTION: chills, fainting      Medication List    TAKE these medications   albuterol 108 (90 Base) MCG/ACT inhaler Commonly known as:  PROVENTIL HFA;VENTOLIN HFA Inhale 2 puffs into the lungs every 4 (four) hours as needed for wheezing or shortness of breath.   apixaban 2.5 MG Tabs tablet Commonly known as:  ELIQUIS Take 1 tablet (  2.5 mg total) by mouth 2 (two) times daily.   arformoterol 15 MCG/2ML Nebu Commonly known as:  BROVANA Take 15 mcg by nebulization 2 (two) times daily.   budesonide 0.5 MG/2ML nebulizer solution Commonly known as:  PULMICORT Take 0.5 mg by nebulization 2 (two) times daily.   [employer] SENNA PLUS 8.6-50 MG tablet Generic drug:  senna-docusate TAKE 1-2 TABS BY MOUTH TWICE DAILY   diltiazem 120 MG 24 hr capsule Commonly known as:   CARDIZEM CD Take 1 capsule (120 mg total) by mouth daily. Start taking on:  06/13/2018   diltiazem 30 MG tablet Commonly known as:  CARDIZEM Take 1 tablet (30 mg total) by mouth 3 (three) times daily as needed (atrial fibrillation).   dorzolamide 2 % ophthalmic solution Commonly known as:  TRUSOPT Place 1 drop into the left eye 2 (two) times daily.   famotidine 20 MG tablet Commonly known as:  PEPCID Take 1 tablet (20 mg total) by mouth 2 (two) times daily. What changed:    when to take this  reasons to take this   fexofenadine 180 MG tablet Commonly known as:  ALLEGRA Take 1 tablet (180 mg total) by mouth 2 (two) times daily. Gradual increase What changed:  when to take this   FLUTTER Devi Use as directed   levothyroxine 75 MCG tablet Commonly known as:  SYNTHROID, LEVOTHROID Take 1 tablet (75 mcg total) by mouth daily. What changed:  when to take this   morphine CONCENTRATE 10 MG/0.5ML Soln concentrated solution Take 0.25 mLs (5 mg total) by mouth every 2 (two) hours as needed for anxiety or shortness of breath.   OXYGEN Inhale 5 L into the lungs continuous.   polyethylene glycol packet Commonly known as:  MIRALAX Take 17 g by mouth daily as needed for mild constipation.   propafenone 225 MG tablet Commonly known as:  RYTHMOL Take 1 tablet (225 mg total) by mouth every 12 (twelve) hours. What changed:    medication strength  how much to take  when to take this   torsemide 20 MG tablet Commonly known as:  DEMADEX Take 1 tablet (20 mg total) by mouth daily. Start taking on:  06/13/2018 What changed:  additional instructions        DISCHARGE INSTRUCTIONS:   1.  PCP follow-up in 1 to 2 weeks 2.  Cardiology follow-up in 1 to 2 weeks 3.  Continue outpatient hospice  DIET:   Regular diet  ACTIVITY:   Activity as tolerated  OXYGEN:   Home Oxygen: Yes.    Oxygen Delivery: 5 liters/min via Patient connected to nasal cannula oxygen  DISCHARGE  LOCATION:   home   If you experience worsening of your admission symptoms, develop shortness of breath, life threatening emergency, suicidal or homicidal thoughts you must seek medical attention immediately by calling 911 or calling your MD immediately  if symptoms less severe.  You Must read complete instructions/literature along with all the possible adverse reactions/side effects for all the Medicines you take and that have been prescribed to you. Take any new Medicines after you have completely understood and accpet all the possible adverse reactions/side effects.   Please note  You were cared for by a hospitalist during your hospital stay. If you have any questions about your discharge medications or the care you received while you were in the hospital after you are discharged, you can call the unit and asked to speak with the hospitalist on call if the hospitalist that took  care of you is not available. Once you are discharged, your primary care physician will handle any further medical issues. Please note that NO REFILLS for any discharge medications will be authorized once you are discharged, as it is imperative that you return to your primary care physician (or establish a relationship with a primary care physician if you do not have one) for your aftercare needs so that they can reassess your need for medications and monitor your lab values.    On the day of Discharge:  VITAL SIGNS:   Blood pressure 113/88, pulse 70, temperature 98 F (36.7 C), temperature source Oral, resp. rate 20, height 5' 8"  (1.727 m), weight 44.1 kg (97 lb 3.2 oz), SpO2 100 %.  PHYSICAL EXAMINATION:   GENERAL:  84 y.o.-year-old thin built and ill nourished patient sitting in the bed with dyspnea which is chronic.  EYES: Pupils equal, round, reactive to light and accommodation. No scleral icterus. Extraocular muscles intact.  HEENT: Head atraumatic, normocephalic. Oropharynx and nasopharynx clear.  NECK:   Supple, no jugular venous distention. No thyroid enlargement, no tenderness.  LUNGS: scant breath sounds bilaterally, no wheezing, rales,rhonchi or crepitation. Using accessory muscles of respiration with minimal exertion.  CARDIOVASCULAR: S1, S2 normal. No   rubs, or gallops. 3/6 systolic murmur present ABDOMEN: Soft, nontender, nondistended. Bowel sounds present. No organomegaly or mass.  EXTREMITIES: No pedal edema, cyanosis, or clubbing.  NEUROLOGIC: Cranial nerves II through XII are intact. Muscle strength 5/5 in all extremities. Sensation intact. Gait not checked. Global weakness noted. PSYCHIATRIC: The patient is alert and oriented x 2-3. forgetful.  SKIN: No obvious rash, lesion, or ulcer.    DATA REVIEW:   CBC Recent Labs  Lab 06/12/18 0457  WBC 9.4  HGB 11.6*  HCT 33.3*  PLT 259    Chemistries  Recent Labs  Lab 06/12/18 0457  NA 136  K 4.0  CL 82*  CO2 44*  GLUCOSE 111*  BUN 22  CREATININE 0.84  CALCIUM 8.7*  MG 2.0     Microbiology Results  Results for orders placed or performed in visit on 05/17/17  Respiratory or Resp and Sputum Culture     Status: None   Collection Time: 05/17/17  1:44 PM  Result Value Ref Range Status   Culture Abundant PSEUDOMONAS AERUGINOSA  Final   Gram Stain Rare  Final   Gram Stain WBC Seen  Final   Gram Stain Moderate Gram Negative Rods  Final   Gram Stain Rare Gram Positive Cocci In Pairs  Final   Organism ID, Bacteria PSEUDOMONAS AERUGINOSA  Final      Susceptibility   Pseudomonas aeruginosa -  (no method available)    PIP/TAZO <=4 Sensitive     CEFTRIAXONE <=1 Sensitive     CEFTAZIDIME <=1 Sensitive     CEFEPIME <=1 Sensitive     GENTAMICIN 2 Sensitive     TOBRAMYCIN <=1 Sensitive     CIPROFLOXACIN 1 Sensitive     LEVOFLOXACIN 2 Sensitive   AFB Culture & Smear     Status: None   Collection Time: 05/17/17  1:44 PM  Result Value Ref Range Status   Source: ;  Final   Acid Fast Bacilli (AFB) Culture    Final     Comment:   MYCOBACTERIA, CULTURE, WITH FLUOROCHROME SMEAR       MICRO NUMBER:      [id number]   TEST STATUS:       FINAL   SPECIMEN SOURCE:  SPUTUM   SPECIMEN QUALITY:  ADEQUATE   SMEAR:             No acid fast bacilli seen.   RESULT:            No Mycobacterium species isolated after                      6 weeks incubation.   Fungus Culture & Smear     Status: Abnormal   Collection Time: 05/17/17  1:44 PM  Result Value Ref Range Status   Source: ;  Final   Fungus (Mycology) Culture   (A)  Corrected    Comment:   CULTURE, FUNGUS W/SMEAR NOT HAIR, SKIN, BLOOD       MICRO NUMBER:      [id number]   TEST STATUS:       FINAL   SPECIMEN SOURCE:   SPUTUM   SPECIMEN QUALITY:  ADEQUATE   SMEAR:             No fungal elements seen.   RESULT:            Scant growth of Yeast present not further identified                      No additional fungi isolated after 4 weeks     RADIOLOGY:  No results found.   Management plans discussed with the patient, family and they are in agreement.  CODE STATUS:     Code Status Orders  (From admission, onward)        Start     Ordered   06/08/18 0454  Do not attempt resuscitation (DNR)  Continuous    Question Answer Comment  In the event of cardiac or respiratory ARREST Do not call a "code blue"   In the event of cardiac or respiratory ARREST Do not perform Intubation, CPR, defibrillation or ACLS   In the event of cardiac or respiratory ARREST Use medication by any route, position, wound care, and other measures to relive pain and suffering. May use oxygen, suction and manual treatment of airway obstruction as needed for comfort.   Comments patient is now receiving hospice      06/08/18 0453    Code Status History    Date Active Date Inactive Code Status Order ID Comments User Context   10/14/2017 1603 06/07/2018 2159 DNR [id number] Discussed at visit Crecencio Mc, MD Outpatient   04/24/2017 1150 04/26/2017 1701 Full Code [id number]  Nicholes Mango, MD Inpatient    Advance Directive Documentation     Most Recent Value  Type of Advance Directive  Living will, Healthcare Power of Attorney, Out of facility DNR (pink MOST or yellow form)  Pre-existing out of facility DNR order (yellow form or pink MOST form)  -  "MOST" Form in Place?  -      TOTAL TIME TAKING CARE OF THIS PATIENT: 38 minutes.    Achille Xiang M.D on 06/12/2018 at 12:52 PM  Between 7am to 6pm - Pager - [phone]  After 6pm go to [email or web address] - Proofreader  [facility]  Office  [phone]  CC: Primary care physician; Crecencio Mc, MD   Note: This dictation was prepared with Dragon dictation along with smaller phrase technology. Any transcriptional errors that result from this process are unintentional.

## 2018-06-12 NOTE — Progress Notes (Signed)
Visit made. Patient seen sitting up in bed, alert, husband at bedside. Patient reports feeling better and appears in much better spirits today. No dyspnea. Cardioversion has been cancelled. Plan is for discharge home today. Patient feels ready as does her husband. Writer spoke with Dr. Tressia Miners and patient's regarding discharging with a liquid morphine prescription. Education regarding use for dyspnea reinforced. Hospice team alerted  to discharge. Updated notes faxed to triage/medical records. Flo Shanks RN, BSN, [facility] [place], hospital liaison [phone]

## 2018-06-12 NOTE — Progress Notes (Signed)
Progress Note  Patient Name: Hannah Kline Date of Encounter: 06/12/2018  Primary Cardiologist: Rockey Situ Primary Electrophysiologist: Caryl Comes  Subjective   Converted to sinus rhythm with heart rate in the 60s bpm. Magnesium 2.0, potassium 4.0, SCr 0.84, WBC 9.4, HGB 11.6, PLT 259. BP soft in the 90s/40s mmHg. Remains on Cardizem CD 120 mg daily, IV Lasix 20 mg q 8 hours, and Rythmol 225 mg bid along with Eliquis 2.5 mg bid (age and weight).   Breathing is at her baseline. Feels too weak to ambulate this morning. No BM in 5 days. No chest pain or palpitations.   Inpatient Medications    Scheduled Meds: . apixaban  2.5 mg Oral BID  . arformoterol  15 mcg Nebulization BID  . budesonide  0.5 mg Nebulization BID  . diltiazem  120 mg Oral Daily  . docusate sodium  100 mg Oral BID  . dorzolamide  1 drop Left Eye BID  . feeding supplement (ENSURE ENLIVE)  237 mL Oral BID BM  . folic acid  1 mg Oral Daily  . furosemide  20 mg Intravenous Q8H  . levothyroxine  75 mcg Oral QAC breakfast  . loratadine  10 mg Oral Daily  . mouth rinse  15 mL Mouth Rinse BID  . ondansetron (ZOFRAN) IV  4 mg Intravenous Q6H  . polyethylene glycol  17 g Oral Daily  . potassium chloride  20 mEq Oral Daily  . propafenone  225 mg Oral Q12H  . sodium chloride flush  3 mL Intravenous Q12H   Continuous Infusions:  PRN Meds: acetaminophen **OR** acetaminophen, albuterol, bisacodyl, famotidine, feeding supplement (ENSURE ENLIVE), HYDROcodone-acetaminophen, morphine CONCENTRATE, promethazine, traZODone   Vital Signs    Vitals:   06/12/18 0436 06/12/18 0439 06/12/18 0718 06/12/18 0803  BP:  130/76  (!) 92/43  Pulse:  66  65  Resp:      Temp:  98 F (36.7 C)    TempSrc:  Oral    SpO2:  96% 99% 100%  Weight: 97 lb 3.2 oz (44.1 kg)     Height:        Intake/Output Summary (Last 24 hours) at 06/12/2018 0900 Last data filed at 06/12/2018 0007 Gross per 24 hour  Intake 600 ml  Output 1300 ml  Net -700 ml    Filed Weights   06/10/18 0500 06/11/18 0432 06/12/18 0436  Weight: 100 lb (45.4 kg) 97 lb 14.4 oz (44.4 kg) 97 lb 3.2 oz (44.1 kg)    Telemetry    Converted to sinus rhythm overnight and has remained in sinus rhythm since with heart rates in the 60s bpm - Personally Reviewed  ECG    n/a - Personally Reviewed  Physical Exam   GEN: Elderly and frail appearing; No acute distress.   Neck: No JVD. Cardiac: RRR, no murmurs, rubs, or gallops.  Respiratory: Diminished, coarse breath sounds bilaterally.  GI: Soft, nontender, non-distended.   MS: No edema; No deformity. Neuro:  Alert and oriented x 3; Nonfocal.  Psych: Normal affect.  Labs    Chemistry Recent Labs  Lab 06/10/18 0522 06/11/18 0442 06/12/18 0457  NA 137 138 136  K 3.9 3.8 4.0  CL 86* 84* 82*  CO2 45* 46* 44*  GLUCOSE 98 98 111*  BUN 20 21 22   CREATININE 0.85 0.78 0.84  CALCIUM 8.4* 8.4* 8.7*  GFRNONAA >60 >60 >60  GFRAA >60 >60 >60  ANIONGAP 6 8 10      Hematology Recent Labs  Lab  06/07/18 2227 06/08/18 0525 06/12/18 0457  WBC 12.5* 10.9 9.4  RBC 3.70* 3.22* 3.48*  HGB 11.9* 10.4* 11.6*  HCT 35.9 31.2* 33.3*  MCV 97.0 97.0 95.7  MCH 32.3 32.3 33.3  MCHC 33.2 33.3 34.8  RDW 14.4 14.4 14.2  PLT 267 233 259    Cardiac Enzymes Recent Labs  Lab 06/07/18 2227  TROPONINI <0.03   No results for input(s): TROPIPOC in the last 168 hours.   BNP Recent Labs  Lab 06/07/18 2227  BNP 371.0*     DDimer No results for input(s): DDIMER in the last 168 hours.   Radiology    No results found.  Cardiac Studies   Echo 06/08/2018: Study Conclusions  - Left ventricle: The cavity size was normal. Systolic function was   normal. The estimated ejection fraction was in the range of 55%   to 60%. Wall motion was normal; there were no regional wall   motion abnormalities. The study is not technically sufficient to   allow evaluation of LV diastolic function. - Right ventricle: The cavity size was  moderately dilated. Wall   thickness was normal. Systolic function was mildly reduced. - Right atrium: The atrium was moderately dilated. - Tricuspid valve: There was moderate regurgitation. - Pulmonary arteries: Systolic pressure was moderately elevated. PA   peak pressure: 53 mm Hg (S).  Impressions:  - Rhythm is atrial fibrillation.  Patient Profile     84 y.o. female with history of chronic respiratory failure secondary to end-stage lung disease on hospice, COPD, MAI, PAF on Eliquis, and chronic diastolic CHF admitted to [facility] with increased SOB.  Assessment & Plan    1. PAF: -Converted to sinus rhythm overnight and has remained in sinus rhythm -Was previously scheduled for DCCV today, can be cancelled now -Eliquis 2.5 mg bid (age & weight) -Cardizem CD 120 mg daily -Rythmol 253 mg bid -Uncertain how long her sinus will hold given her underlying pulmonary disease   2. Chronic respiratory failure secondary to end-stage lung disease, COPD, MAI: -Being discharged to hospice -Likely driving her Afib -Per pulmonology   3. Acute on chronic diastolic CHF: -Net - 5.5 L for the admission -Transition to torsemide 20 mg daily (previously on prn dosing)  4. Constipation: -No BM x 5 days -Per IM   For questions or updates, please contact [place] Please consult [email or web address] for contact info under Cardiology/STEMI.    Signed, Christell Faith, PA-C [place] Pager: [phone] 06/12/2018, 9:00 AM

## 2018-06-12 NOTE — Plan of Care (Signed)
  Problem: Clinical Measurements: Goal: Respiratory complications will improve Outcome: Progressing Goal: Cardiovascular complication will be avoided Outcome: Progressing   Problem: Safety: Goal: Ability to remain free from injury will improve Outcome: Progressing   Problem: Education: Goal: Ability to verbalize understanding of medication therapies will improve Outcome: Progressing   Problem: Cardiac: Goal: Ability to achieve and maintain adequate cardiopulmonary perfusion will improve Outcome: Progressing

## 2018-06-12 NOTE — Telephone Encounter (Signed)
Called and spoke with Tammy with [facility] at phone [phone] Pt is currently still in hospital at [facility], will be discharged 06/13/18 Pt was never discharged with hospice; pt is going back to hospice facility tomorrow Nothing further needed at this time.

## 2018-06-14 ENCOUNTER — Ambulatory Visit: Admission: RE | Admit: 2018-06-14 | Payer: Medicare Other | Source: Ambulatory Visit | Admitting: Cardiovascular Disease

## 2018-06-19 ENCOUNTER — Ambulatory Visit: Payer: No Typology Code available for payment source | Admitting: Family

## 2018-06-19 ENCOUNTER — Ambulatory Visit (INDEPENDENT_AMBULATORY_CARE_PROVIDER_SITE_OTHER): Admitting: Internal Medicine

## 2018-06-19 ENCOUNTER — Encounter: Payer: Self-pay | Admitting: Internal Medicine

## 2018-06-19 ENCOUNTER — Other Ambulatory Visit: Payer: Self-pay | Admitting: Internal Medicine

## 2018-06-19 ENCOUNTER — Ambulatory Visit (INDEPENDENT_AMBULATORY_CARE_PROVIDER_SITE_OTHER)

## 2018-06-19 VITALS — BP 112/64 | HR 88 | Temp 97.5°F | Resp 17 | Ht 68.0 in | Wt 99.6 lb

## 2018-06-19 DIAGNOSIS — Z09 Encounter for follow-up examination after completed treatment for conditions other than malignant neoplasm: Secondary | ICD-10-CM | POA: Diagnosis not present

## 2018-06-19 DIAGNOSIS — M5440 Lumbago with sciatica, unspecified side: Secondary | ICD-10-CM

## 2018-06-19 DIAGNOSIS — M545 Low back pain: Secondary | ICD-10-CM

## 2018-06-19 DIAGNOSIS — I480 Paroxysmal atrial fibrillation: Secondary | ICD-10-CM | POA: Diagnosis not present

## 2018-06-19 DIAGNOSIS — M79604 Pain in right leg: Secondary | ICD-10-CM

## 2018-06-19 MED ORDER — CELECOXIB 200 MG PO CAPS: 200.0000 mg | ORAL_CAPSULE | Freq: Every day | ORAL | 1 refills | Status: DC

## 2018-06-19 NOTE — Progress Notes (Signed)
Subjective:  Patient ID: Hannah Kline, female    DOB: April 25, 1932  Age: 84 y.o. MRN: [id number]  CC: The primary encounter diagnosis was Acute bilateral low back pain with sciatica, sciatica laterality unspecified. Diagnoses of Hospital discharge follow-up, Low back pain radiating to both legs, and Paroxysmal A-fib ([place]) were also pertinent to this visit.  HPI Hannah Kline presents for hospital  follow up .  Patient was admitted on July 31 with  hemoptysis and respiratory /heart failure aggravated by PAF.  Her atrial fibrillation  spontaneously converted to sinus rhythm while in house and she was discharged on August 5 long acting cardizem.  She was already taking  eliquis for primary stroke prevention.   She was treated with IV lasix for diuresis and and ECHO was done which noted normal  systolic function  With EF %% to 60%, severe pulmonary hypertension, and no WMA.  She did not receive empiric antibiotics or steroids .  She was treated per Hospice protocol with roxanol as needed and home Hospice care was continued at discharge      She is accompanied by her husband today.  She is having trouble maintining upright position today due to back pain.  She reports that she had a fall at home on the day she was discharged when she attempted to walk unassisted to the bathroom.   This occurred  6 days ago.  Husband reports that she was lethargic from the morphine  and diazepam that was given prior to discharge, and she slipped on the wooden floor,  Falling face down ,  No LOC.  Had back pain at the time.  Treated iintialy with tylenol .   Lower back is feeling better today but still aching and sore to the waistline. However since Saturday (48 hours ago) she has developed significant pain in both thighs,  and both hips feel sore and achy as well.  She appears miserable and accuses her husband of  not treating her pain adequately,  But he states that she has not asked for the morphine.  "I'm on death's door."      She remains severely 02 dependent.  Her Pulse ox on 5L 02 was 69% after walking,  Finally improved to 93% with rest.  She has been receiving roxanol  a "couple times daily "  For sob (twice daily) but patient thinks she needs it more frequently.   She has a chronic productive cough  Due to bronchiectasis .  She denies  fevers or chills.   .  Was not seen by pulmonologist during admission.   Outpatient Medications Prior to Visit  Medication Sig Dispense Refill  . apixaban (ELIQUIS) 2.5 MG TABS tablet Take 1 tablet (2.5 mg total) by mouth 2 (two) times daily. 60 tablet 11  . arformoterol (BROVANA) 15 MCG/2ML NEBU Take 15 mcg by nebulization 2 (two) times daily.    . budesonide (PULMICORT) 0.5 MG/2ML nebulizer solution Take 0.5 mg by nebulization 2 (two) times daily.    . [employer] SENNA PLUS 8.6-50 MG tablet TAKE 1-2 TABS BY MOUTH TWICE DAILY  3  . diltiazem (CARDIZEM CD) 120 MG 24 hr capsule Take 1 capsule (120 mg total) by mouth daily. 30 capsule 2  . dorzolamide (TRUSOPT) 2 % ophthalmic solution Place 1 drop into the left eye 2 (two) times daily.  7  . famotidine (PEPCID) 20 MG tablet Take 1 tablet (20 mg total) by mouth 2 (two) times daily. (Patient taking differently: Take 20 mg  by mouth daily as needed for heartburn. ) 180 tablet 2  . fexofenadine (ALLEGRA) 180 MG tablet Take 1 tablet (180 mg total) by mouth 2 (two) times daily. Gradual increase 30 tablet 0  . levothyroxine (SYNTHROID, LEVOTHROID) 75 MCG tablet Take 1 tablet (75 mcg total) by mouth daily. (Patient taking differently: Take 75 mcg by mouth daily before breakfast. ) 90 tablet 1  . Morphine Sulfate (MORPHINE CONCENTRATE) 10 MG/0.5ML SOLN concentrated solution Take 0.25 mLs (5 mg total) by mouth every 2 (two) hours as needed for anxiety or shortness of breath. 30 mL 0  . OXYGEN Inhale 5 L into the lungs continuous.     . polyethylene glycol (MIRALAX) packet Take 17 g by mouth daily as needed for mild constipation. 28 each 0  .  propafenone (RYTHMOL) 225 MG tablet Take 1 tablet (225 mg total) by mouth every 12 (twelve) hours. 60 tablet 2  . Respiratory Therapy Supplies (FLUTTER) DEVI Use as directed 1 each 0  . torsemide (DEMADEX) 20 MG tablet Take 1 tablet (20 mg total) by mouth daily. 30 tablet 2  . albuterol (PROVENTIL HFA;VENTOLIN HFA) 108 (90 Base) MCG/ACT inhaler Inhale 2 puffs into the lungs every 4 (four) hours as needed for wheezing or shortness of breath. (Patient not taking: Reported on 06/19/2018) 1 Inhaler 2  . diltiazem (CARDIZEM) 30 MG tablet Take 1 tablet (30 mg total) by mouth 3 (three) times daily as needed (atrial fibrillation). (Patient not taking: Reported on 06/19/2018) 270 tablet 3   No facility-administered medications prior to visit.     Review of Systems;  Patient denies headache, fevers, malaise, unintentional weight loss, skin rash, eye pain, sinus congestion and sinus pain, sore throat, dysphagia,  hemoptysis , cough, dyspnea, wheezing, chest pain, palpitations, orthopnea, edema, abdominal pain, nausea, melena, diarrhea, constipation, flank pain, dysuria, hematuria, urinary  Frequency, nocturia, numbness, tingling, seizures,  Focal weakness, Loss of consciousness,  Tremor, insomnia, depression, anxiety, and suicidal ideation.      Objective:  BP 112/64 (BP Location: Left Arm, Patient Position: Sitting, Cuff Size: Normal)   Pulse 88   Temp (!) 97.5 F (36.4 C) (Oral)   Resp 17   Ht 5\' 8"  (1.727 m)   Wt 99 lb 9.6 oz (45.2 kg)   SpO2 93%   BMI 15.14 kg/m   BP Readings from Last 3 Encounters:  06/19/18 112/64  06/12/18 113/88  05/23/18 108/62    Wt Readings from Last 3 Encounters:  06/19/18 99 lb 9.6 oz (45.2 kg)  06/12/18 97 lb 3.2 oz (44.1 kg)  05/23/18 100 lb (45.4 kg)    General appearance: cachectic, alert, cooperative and appears stated age Back: kyphotic, ROM restricted .  No spinal tenderness, no bruising . Lungs: diminished BS  bilaterally Heart: regular rate and  rhythm, S1, S2 normal, no murmur, click, rub or gallop Abdomen: soft, non-tender; bowel sounds normal; no masses,  no organomegaly Pulses: 2+ and symmetric Skin: Skin color, texture, turgor normal. No rashes or lesions Lymph nodes: Cervical, supraclavicular, and axillary nodes normal. Ext:  No bruising,  No edema.   Lab Results  Component Value Date   HGBA1C 5.8 (H) 11/22/2014    Lab Results  Component Value Date   CREATININE 0.84 06/12/2018   CREATININE 0.78 06/11/2018   CREATININE 0.85 06/10/2018    Lab Results  Component Value Date   WBC 9.4 06/12/2018   HGB 11.6 (L) 06/12/2018   HCT 33.3 (L) 06/12/2018   PLT 259 06/12/2018  GLUCOSE 111 (H) 06/12/2018   CHOL 177 01/22/2016   TRIG 78.0 01/22/2016   HDL 53.50 01/22/2016   LDLCALC 108 (H) 01/22/2016   ALT 12 02/13/2018   AST 14 02/13/2018   NA 136 06/12/2018   K 4.0 06/12/2018   CL 82 (L) 06/12/2018   CREATININE 0.84 06/12/2018   BUN 22 06/12/2018   CO2 44 (H) 06/12/2018   TSH 4.08 02/13/2018   INR 1.00 06/07/2018   HGBA1C 5.8 (H) 11/22/2014    No results found.  Assessment & Plan:   Problem List Items Addressed This Visit    Paroxysmal A-fib ([place])    S/p spontaneous conversion during hospitalization.  Continue propafenone,  LA cardizem. ,  [facility] discharge follow-up    Patient was discharged on August 5 .  From a cardiac/pul,oary standpoint she  is stable post discharge, but h      Low back pain radiating to both legs    Her new onset bilateral leg pain /hip pain following an unwitnessed fall at home on the day of discharge needs workup.  She was unable to tolerate positioning for a full series of lumbar spine films due to pain and weakness.  A one view lumbar film showed no obvious fractures,  But she may well have a herniated disk which could be causing mild radiculopathy.  Since she is able to bear weight on both hips and there is no bruising (on Eliquis) a hip /femur fracture is very  unlikely.  Wil outline a pain management regimen  And include an NSAID .        Relevant Medications   celecoxib (CELEBREX) 200 MG capsule    Other Visit Diagnoses    Acute bilateral low back pain with sciatica, sciatica laterality unspecified    -  Primary   Relevant Medications   celecoxib (CELEBREX) 200 MG capsule      I am having Shirlee Limerick Bannan start on celecoxib. I am also having her maintain her OXYGEN, diltiazem, dorzolamide, FLUTTER, albuterol, famotidine, [employer]S SENNA PLUS, levothyroxine, apixaban, arformoterol, budesonide, propafenone, diltiazem, morphine CONCENTRATE, torsemide, fexofenadine, and polyethylene glycol.  Meds ordered this encounter  Medications  . celecoxib (CELEBREX) 200 MG capsule    Sig: Take 1 capsule (200 mg total) by mouth daily.    Dispense:  30 capsule    Refill:  1    There are no discontinued medications.  Follow-up: No follow-ups on file.   Crecencio Mc, MD

## 2018-06-19 NOTE — Patient Instructions (Addendum)
You can take up to 2000 mg tylenol every day safely   500 mg every 6 hours   Adding celebrex 200 mg once daily with food  Ok to give  morphine after 1 hour if pain is not controlled.   X ray lumbar spine today

## 2018-06-20 DIAGNOSIS — M545 Low back pain: Secondary | ICD-10-CM | POA: Insufficient documentation

## 2018-06-20 DIAGNOSIS — M79605 Pain in left leg: Secondary | ICD-10-CM | POA: Insufficient documentation

## 2018-06-20 NOTE — Assessment & Plan Note (Addendum)
Patient was discharged on August 5 .  From a cardiac/pul,oary standpoint she  is stable post discharge, but h

## 2018-06-20 NOTE — Assessment & Plan Note (Signed)
Her new onset bilateral leg pain /hip pain following an unwitnessed fall at home on the day of discharge needs workup.  She was unable to tolerate positioning for a full series of lumbar spine films due to pain and weakness.  A one view lumbar film showed no obvious fractures,  But she may well have a herniated disk which could be causing mild radiculopathy.  Since she is able to bear weight on both hips and there is no bruising (on Eliquis) a hip /femur fracture is very unlikely.  Wil outline a pain management regimen  And include an NSAID .

## 2018-06-20 NOTE — Assessment & Plan Note (Signed)
S/p spontaneous conversion during hospitalization.  Continue propafenone,  LA cardizem. ,  eliquis

## 2018-06-21 ENCOUNTER — Telehealth: Payer: Self-pay | Admitting: Pulmonary Disease

## 2018-06-21 NOTE — Telephone Encounter (Signed)
Spoke with Deirdre, she wanted to know if pt could use duoneb. I advised her in BQ note he advised her to use is as needed for chest tightness. See note below. Advised Dierdre and nothing further is needed.    Bronchiectasis: Take Brovana twice a day Take Pulmicort twice a day When she start taking Brovana and Pulmicort use the albuterol-ipratropium only as needed for chest tightness wheezing or shortness of breath, not on a regular basis Call me if you have increased chest congestion mucus production or shortness of breath  Chronic respiratory failure with hypoxemia: Keep using 4 to 5 L as you are doing  Atrial fibrillation: Resume Eliquis Follow-up with Dr. Caryl Comes for cardioversion  Shortness of breath: I think it is best for you to just use morphine rather than morphine and alprazolam when you feel short of breath  Follow-up with me in 6 to 8 weeks or sooner if needed

## 2018-06-22 ENCOUNTER — Ambulatory Visit: Payer: Self-pay

## 2018-06-22 NOTE — Telephone Encounter (Signed)
Patient called in with c/o "pelvic pain and difficulty urinating." She says "I saw Dr. Derrel Nip in the office for the pain and I don't think I mentioned about my urination problem and the swelling down there. I feel the urge to urinate and when I sit on the toilet, I have to sit for about 10 minutes and strain it out. When it comes, it comes in squirts and only about 1/4 cup at a time. I feel like I stay until no more comes out and I can go for hours before having the urge to go again. The color is yellow and clear, no odor. I am also swollen down there to one side. Dr. Derrel Nip told me that if the pain was no better by the weekend, that she would order a MRI and I want to know if it can be done." According to protocol, see PCP within 2 weeks, patient says "I don't want to come in because I've already seen her. Just let whoever is there know what she said and see if they can order the MRI." I advised someone from the office will call tomorrow with the provider recommendation, she verbalized understanding.   Reason for Disposition . Urination is difficult to start (i.e., hesitancy) or straining  Answer Assessment - Initial Assessment Questions 1. SYMPTOM: "What's the main symptom you're concerned about?" (e.g., frequency, incontinence)     Urgency 2. ONSET: "When did the Urgency start?"     A few days ago 3. PAIN: "Is there any pain?" If so, ask: "How bad is it?" (Scale: 1-10; mild, moderate, severe)     Not painful, but it doesn't flow out easily 4. CAUSE: "What do you think is causing the symptoms?"     I don't know 5. OTHER SYMPTOMS: "Do you have any other symptoms?" (e.g., fever, flank pain, blood in urine, pain with urination)     Pelvic pain, swelling to the back of my labia 6. PREGNANCY: "Is there any chance you are pregnant?" "When was your last menstrual period?"     N/A  Answer Assessment - Initial Assessment Questions 1. LOCATION: "Where does it hurt?"      Pelvis 2. RADIATION: "Does the  pain shoot anywhere else?" (e.g., lower back, groin, thighs)     Lower back 3. ONSET: "When did the pain begin?" (e.g., minutes, hours or days ago)      After the fall 4. SUDDEN: "Gradual or sudden onset?"     Sudden from a fall 5. PATTERN "Does the pain come and go, or is it constant?"    - If constant: "Is it getting better, staying the same, or worsening?"      (Note: Constant means the pain never goes away completely; most serious pain is constant and gets worse over time)     - If intermittent: "How long does it last?" "Do you have pain now?"     (Note: Intermittent means the pain goes away completely between bouts)     Constant, I can't even sit down 6. SEVERITY: "How bad is the pain?"  (e.g., Scale 1-10; mild, moderate, or severe)   - MILD (1-3): doesn't interfere with normal activities, area soft and not tender to touch    - MODERATE (4-7): interferes with normal activities or awakens from sleep, tender to touch    - SEVERE (8-10): excruciating pain, doubled over, unable to do any normal activities      Severe 7. RECURRENT SYMPTOM: "Have you ever had this type  of pelvic pain before?" If so, ask: "When was the last time?" and "What happened that time?"     No 8. CAUSE: "What do you think is causing the pelvic pain?"     I don't know 9. RELIEVING/AGGRAVATING FACTORS: "What makes it better or worse?" (e.g., activity/rest, sexual intercourse, voiding, passing stool)     Nothing has helped so far 10. OTHER SYMPTOMS: "Has there been any other symptoms?" (e.g., fever, vaginal bleeding, vaginal discharge, diarrhea, constipation, or voiding problems?"       Urine problems-slow to start, strain out urine 11. PREGNANCY: "Is there any chance you are pregnant?" "When was your last menstrual period?"      No  Protocols used: URINARY SYMPTOMS-A-AH, PELVIC PAIN - [facility]

## 2018-06-23 ENCOUNTER — Telehealth: Payer: Self-pay

## 2018-06-23 ENCOUNTER — Other Ambulatory Visit (INDEPENDENT_AMBULATORY_CARE_PROVIDER_SITE_OTHER)

## 2018-06-23 ENCOUNTER — Telehealth: Payer: Self-pay | Admitting: Internal Medicine

## 2018-06-23 ENCOUNTER — Other Ambulatory Visit: Payer: Self-pay | Admitting: Radiology

## 2018-06-23 ENCOUNTER — Ambulatory Visit: Admitting: Family Medicine

## 2018-06-23 DIAGNOSIS — R399 Unspecified symptoms and signs involving the genitourinary system: Secondary | ICD-10-CM | POA: Diagnosis not present

## 2018-06-23 DIAGNOSIS — R39198 Other difficulties with micturition: Secondary | ICD-10-CM

## 2018-06-23 LAB — POCT URINALYSIS DIPSTICK
Bilirubin, UA: NEGATIVE
Blood, UA: NEGATIVE
Glucose, UA: NEGATIVE
Ketones, UA: NEGATIVE
Leukocytes, UA: NEGATIVE
NITRITE UA: NEGATIVE
PROTEIN UA: NEGATIVE
SPEC GRAV UA: 1.015 (ref 1.010–1.025)
Urobilinogen, UA: 0.2 E.U./dL
pH, UA: 6 (ref 5.0–8.0)

## 2018-06-23 NOTE — Telephone Encounter (Signed)
Copied from [place] [id number]. Topic: Quick Communication - See Telephone Encounter >> Jun 23, 2018 10:34 AM Rutherford Nail, NT wrote: CRM for notification. See Telephone encounter for: 06/23/18. Deirdre with [facility] calling to see if Dr Derrel Nip can prescribe Tramadol scheduled with the patient's tylenol. States that it is recommended by [facility]. Please advise. CB#: [phone]

## 2018-06-23 NOTE — Telephone Encounter (Signed)
We will test urine and treat accordingly.   She was under impression if she came in to office today she would get an MRI - If her pain becomes unmanagable over the weekend, I would advise her to go to ER.   Philis Nettle FNP

## 2018-06-23 NOTE — Telephone Encounter (Signed)
Is Dr. Nicki Reaper handling Dr. Lupita Dawn messages today

## 2018-06-23 NOTE — Telephone Encounter (Signed)
Patient is seeing Lauren today

## 2018-06-23 NOTE — Telephone Encounter (Signed)
Called patient this morning to see if she could come in to see Lauren. Patient states that she is not able to get out of bed and that she is on hospice and the hospice nurse will be in to see her today.

## 2018-06-23 NOTE — Telephone Encounter (Signed)
As per discussion with provider and due to the patient inability to get out of bed, we are going to cancel this appointment and have husband collect a urine sample and bring back, instead of patient coming to appointment.

## 2018-06-23 NOTE — Telephone Encounter (Signed)
Pt husband dropped off urine sample. Please place future orders. Thank you.

## 2018-06-24 LAB — URINALYSIS, MICROSCOPIC ONLY: BACTERIA UA: NONE SEEN /HPF

## 2018-06-24 LAB — URINE CULTURE
MICRO NUMBER: 90977391
SPECIMEN QUALITY: ADEQUATE

## 2018-06-25 ENCOUNTER — Encounter: Payer: Self-pay | Admitting: Internal Medicine

## 2018-06-25 ENCOUNTER — Other Ambulatory Visit: Payer: Self-pay

## 2018-06-25 ENCOUNTER — Emergency Department

## 2018-06-25 ENCOUNTER — Inpatient Hospital Stay
Admission: EM | Admit: 2018-06-25 | Discharge: 2018-06-27 | DRG: 189 | Disposition: A | Discharge status: Hospice | Attending: Internal Medicine | Admitting: Internal Medicine

## 2018-06-25 DIAGNOSIS — D72829 Elevated white blood cell count, unspecified: Secondary | ICD-10-CM | POA: Diagnosis present

## 2018-06-25 DIAGNOSIS — Z66 Do not resuscitate: Secondary | ICD-10-CM | POA: Diagnosis not present

## 2018-06-25 DIAGNOSIS — Z79899 Other long term (current) drug therapy: Secondary | ICD-10-CM

## 2018-06-25 DIAGNOSIS — M25552 Pain in left hip: Secondary | ICD-10-CM | POA: Diagnosis not present

## 2018-06-25 DIAGNOSIS — I480 Paroxysmal atrial fibrillation: Secondary | ICD-10-CM | POA: Diagnosis present

## 2018-06-25 DIAGNOSIS — Z515 Encounter for palliative care: Secondary | ICD-10-CM | POA: Diagnosis present

## 2018-06-25 DIAGNOSIS — S3210XA Unspecified fracture of sacrum, initial encounter for closed fracture: Secondary | ICD-10-CM | POA: Diagnosis present

## 2018-06-25 DIAGNOSIS — W19XXXA Unspecified fall, initial encounter: Secondary | ICD-10-CM | POA: Diagnosis present

## 2018-06-25 DIAGNOSIS — R0902 Hypoxemia: Secondary | ICD-10-CM

## 2018-06-25 DIAGNOSIS — J9621 Acute and chronic respiratory failure with hypoxia: Secondary | ICD-10-CM | POA: Diagnosis present

## 2018-06-25 DIAGNOSIS — M545 Low back pain, unspecified: Secondary | ICD-10-CM

## 2018-06-25 DIAGNOSIS — J479 Bronchiectasis, uncomplicated: Secondary | ICD-10-CM | POA: Diagnosis not present

## 2018-06-25 DIAGNOSIS — F419 Anxiety disorder, unspecified: Secondary | ICD-10-CM | POA: Diagnosis present

## 2018-06-25 DIAGNOSIS — Y92009 Unspecified place in unspecified non-institutional (private) residence as the place of occurrence of the external cause: Secondary | ICD-10-CM

## 2018-06-25 DIAGNOSIS — Z79890 Hormone replacement therapy (postmenopausal): Secondary | ICD-10-CM

## 2018-06-25 DIAGNOSIS — R918 Other nonspecific abnormal finding of lung field: Secondary | ICD-10-CM | POA: Diagnosis present

## 2018-06-25 DIAGNOSIS — S22089A Unspecified fracture of T11-T12 vertebra, initial encounter for closed fracture: Secondary | ICD-10-CM | POA: Diagnosis present

## 2018-06-25 DIAGNOSIS — E039 Hypothyroidism, unspecified: Secondary | ICD-10-CM | POA: Diagnosis present

## 2018-06-25 DIAGNOSIS — Z7189 Other specified counseling: Secondary | ICD-10-CM | POA: Diagnosis not present

## 2018-06-25 DIAGNOSIS — I4891 Unspecified atrial fibrillation: Secondary | ICD-10-CM | POA: Diagnosis not present

## 2018-06-25 DIAGNOSIS — Z7951 Long term (current) use of inhaled steroids: Secondary | ICD-10-CM | POA: Diagnosis not present

## 2018-06-25 DIAGNOSIS — J449 Chronic obstructive pulmonary disease, unspecified: Secondary | ICD-10-CM | POA: Diagnosis present

## 2018-06-25 DIAGNOSIS — Z9981 Dependence on supplemental oxygen: Secondary | ICD-10-CM

## 2018-06-25 DIAGNOSIS — S22080A Wedge compression fracture of T11-T12 vertebra, initial encounter for closed fracture: Secondary | ICD-10-CM | POA: Diagnosis not present

## 2018-06-25 DIAGNOSIS — Z7901 Long term (current) use of anticoagulants: Secondary | ICD-10-CM | POA: Diagnosis not present

## 2018-06-25 DIAGNOSIS — I272 Pulmonary hypertension, unspecified: Secondary | ICD-10-CM | POA: Diagnosis not present

## 2018-06-25 DIAGNOSIS — A312 Disseminated mycobacterium avium-intracellulare complex (DMAC): Secondary | ICD-10-CM | POA: Diagnosis not present

## 2018-06-25 DIAGNOSIS — J9622 Acute and chronic respiratory failure with hypercapnia: Secondary | ICD-10-CM | POA: Diagnosis present

## 2018-06-25 HISTORY — DX: Chronic respiratory failure with hypoxia: J96.11

## 2018-06-25 HISTORY — DX: Chronic respiratory failure with hypercapnia: J96.12

## 2018-06-25 LAB — URINALYSIS, COMPLETE (UACMP) WITH MICROSCOPIC
Bilirubin Urine: NEGATIVE
GLUCOSE, UA: NEGATIVE mg/dL
Hgb urine dipstick: NEGATIVE
KETONES UR: NEGATIVE mg/dL
Leukocytes, UA: NEGATIVE
NITRITE: NEGATIVE
PH: 6 (ref 5.0–8.0)
Protein, ur: NEGATIVE mg/dL
SPECIFIC GRAVITY, URINE: 1.015 (ref 1.005–1.030)

## 2018-06-25 LAB — BASIC METABOLIC PANEL
Anion gap: 9 (ref 5–15)
BUN: 20 mg/dL (ref 8–23)
CHLORIDE: 91 mmol/L — AB (ref 98–111)
CO2: 38 mmol/L — ABNORMAL HIGH (ref 22–32)
CREATININE: 0.75 mg/dL (ref 0.44–1.00)
Calcium: 8.6 mg/dL — ABNORMAL LOW (ref 8.9–10.3)
Glucose, Bld: 116 mg/dL — ABNORMAL HIGH (ref 70–99)
POTASSIUM: 3.7 mmol/L (ref 3.5–5.1)
Sodium: 138 mmol/L (ref 135–145)

## 2018-06-25 LAB — CBC
HCT: 37.6 % (ref 35.0–47.0)
HEMOGLOBIN: 12.4 g/dL (ref 12.0–16.0)
MCH: 31.8 pg (ref 26.0–34.0)
MCHC: 33.1 g/dL (ref 32.0–36.0)
MCV: 96.1 fL (ref 80.0–100.0)
PLATELETS: 314 10*3/uL (ref 150–440)
RBC: 3.92 MIL/uL (ref 3.80–5.20)
RDW: 14.4 % (ref 11.5–14.5)
WBC: 22.5 10*3/uL — ABNORMAL HIGH (ref 3.6–11.0)

## 2018-06-25 LAB — LACTIC ACID, PLASMA
LACTIC ACID, VENOUS: 0.7 mmol/L (ref 0.5–1.9)
Lactic Acid, Venous: 1.1 mmol/L (ref 0.5–1.9)

## 2018-06-25 MED ORDER — POLYETHYLENE GLYCOL 3350 17 G PO PACK: 17.0000 g | PACK | Freq: Every day | ORAL | Status: DC | PRN

## 2018-06-25 MED ORDER — SENNOSIDES-DOCUSATE SODIUM 8.6-50 MG PO TABS
2.0000 | ORAL_TABLET | Freq: Every day | ORAL | Status: DC
  Administered 2018-06-26 – 2018-06-27 (×2): 2 via ORAL
  Filled 2018-06-25 (×2): qty 2

## 2018-06-25 MED ORDER — MORPHINE SULFATE (PF) 2 MG/ML IV SOLN
2.0000 mg | INTRAVENOUS | Status: DC | PRN
  Administered 2018-06-25 – 2018-06-26 (×2): 2 mg via INTRAVENOUS
  Filled 2018-06-25 (×2): qty 1

## 2018-06-25 MED ORDER — AZITHROMYCIN 500 MG PO TABS
250.0000 mg | ORAL_TABLET | Freq: Every day | ORAL | Status: DC
  Administered 2018-06-26 – 2018-06-27 (×2): 250 mg via ORAL
  Filled 2018-06-25 (×2): qty 1

## 2018-06-25 MED ORDER — BUDESONIDE 0.5 MG/2ML IN SUSP
0.5000 mg | Freq: Two times a day (BID) | RESPIRATORY_TRACT | Status: DC
  Administered 2018-06-25 – 2018-06-27 (×4): 0.5 mg via RESPIRATORY_TRACT
  Filled 2018-06-25 (×5): qty 2

## 2018-06-25 MED ORDER — ONDANSETRON HCL 4 MG PO TABS: 4.0000 mg | ORAL_TABLET | Freq: Four times a day (QID) | ORAL | Status: DC | PRN

## 2018-06-25 MED ORDER — HYDROMORPHONE HCL 1 MG/ML IJ SOLN
1.0000 mg | Freq: Once | INTRAMUSCULAR | Status: AC
  Administered 2018-06-25: 1 mg via INTRAVENOUS
  Filled 2018-06-25: qty 1

## 2018-06-25 MED ORDER — OXYCODONE HCL 5 MG PO TABS
5.0000 mg | ORAL_TABLET | ORAL | Status: DC | PRN
  Administered 2018-06-26: 08:00:00 5 mg via ORAL
  Filled 2018-06-25: qty 1

## 2018-06-25 MED ORDER — AZITHROMYCIN 500 MG PO TABS
500.0000 mg | ORAL_TABLET | Freq: Every day | ORAL | Status: AC
  Administered 2018-06-25: 500 mg via ORAL
  Filled 2018-06-25: qty 1

## 2018-06-25 MED ORDER — MORPHINE SULFATE (CONCENTRATE) 10 MG/0.5ML PO SOLN
5.0000 mg | ORAL | Status: DC | PRN
  Administered 2018-06-25 – 2018-06-27 (×5): 5 mg via ORAL
  Filled 2018-06-25 (×6): qty 1

## 2018-06-25 MED ORDER — IPRATROPIUM-ALBUTEROL 0.5-2.5 (3) MG/3ML IN SOLN
3.0000 mL | Freq: Once | RESPIRATORY_TRACT | Status: AC
  Administered 2018-06-25: 3 mL via RESPIRATORY_TRACT
  Filled 2018-06-25: qty 3

## 2018-06-25 MED ORDER — ACETAMINOPHEN 325 MG PO TABS: 650.0000 mg | ORAL_TABLET | Freq: Four times a day (QID) | ORAL | Status: DC | PRN

## 2018-06-25 MED ORDER — ARFORMOTEROL TARTRATE 15 MCG/2ML IN NEBU
15.0000 ug | INHALATION_SOLUTION | Freq: Two times a day (BID) | RESPIRATORY_TRACT | Status: DC
Start: 2018-06-25 — End: 2018-06-27
  Administered 2018-06-25 – 2018-06-27 (×5): 15 ug via RESPIRATORY_TRACT
  Filled 2018-06-25 (×6): qty 2

## 2018-06-25 MED ORDER — FENTANYL CITRATE (PF) 100 MCG/2ML IJ SOLN: 25.0000 ug | INTRAMUSCULAR | Status: DC | PRN

## 2018-06-25 MED ORDER — SODIUM CHLORIDE 0.9 % IV SOLN
1.0000 g | INTRAVENOUS | Status: DC
  Administered 2018-06-25 – 2018-06-26 (×2): 1 g via INTRAVENOUS
  Filled 2018-06-25: qty 10
  Filled 2018-06-25 (×2): qty 1

## 2018-06-25 MED ORDER — LORAZEPAM 0.5 MG PO TABS
0.5000 mg | ORAL_TABLET | Freq: Two times a day (BID) | ORAL | Status: DC | PRN
  Administered 2018-06-25 – 2018-06-27 (×2): 0.5 mg via ORAL
  Filled 2018-06-25 (×2): qty 1

## 2018-06-25 MED ORDER — ACETAMINOPHEN 650 MG RE SUPP: 650.0000 mg | Freq: Four times a day (QID) | RECTAL | Status: DC | PRN

## 2018-06-25 MED ORDER — LEVOTHYROXINE SODIUM 50 MCG PO TABS
75.0000 ug | ORAL_TABLET | Freq: Every day | ORAL | Status: DC
  Administered 2018-06-26 – 2018-06-27 (×2): 75 ug via ORAL
  Filled 2018-06-25: qty 3
  Filled 2018-06-25: qty 2

## 2018-06-25 MED ORDER — ONDANSETRON HCL 4 MG/2ML IJ SOLN: 4.0000 mg | Freq: Four times a day (QID) | INTRAMUSCULAR | Status: DC | PRN

## 2018-06-25 MED ORDER — TORSEMIDE 20 MG PO TABS
20.0000 mg | ORAL_TABLET | Freq: Every day | ORAL | Status: DC
  Administered 2018-06-26 – 2018-06-27 (×2): 20 mg via ORAL
  Filled 2018-06-25 (×2): qty 1

## 2018-06-25 MED ORDER — IPRATROPIUM-ALBUTEROL 0.5-2.5 (3) MG/3ML IN SOLN
3.0000 mL | Freq: Four times a day (QID) | RESPIRATORY_TRACT | Status: DC
  Administered 2018-06-25 – 2018-06-26 (×4): 3 mL via RESPIRATORY_TRACT
  Filled 2018-06-25 (×5): qty 3

## 2018-06-25 MED ORDER — LORATADINE 10 MG PO TABS
10.0000 mg | ORAL_TABLET | Freq: Every day | ORAL | Status: DC
  Administered 2018-06-26 – 2018-06-27 (×2): 10 mg via ORAL
  Filled 2018-06-25 (×2): qty 1

## 2018-06-25 MED ORDER — DORZOLAMIDE HCL 2 % OP SOLN
1.0000 [drp] | Freq: Two times a day (BID) | OPHTHALMIC | Status: DC
  Administered 2018-06-25 – 2018-06-27 (×5): 1 [drp] via OPHTHALMIC
  Filled 2018-06-25: qty 10

## 2018-06-25 MED ORDER — APIXABAN 2.5 MG PO TABS
2.5000 mg | ORAL_TABLET | Freq: Two times a day (BID) | ORAL | Status: DC
  Administered 2018-06-25 – 2018-06-27 (×4): 2.5 mg via ORAL
  Filled 2018-06-25 (×4): qty 1

## 2018-06-25 MED ORDER — DILTIAZEM HCL ER COATED BEADS 120 MG PO CP24
120.0000 mg | ORAL_CAPSULE | Freq: Every day | ORAL | Status: DC
  Administered 2018-06-25 – 2018-06-27 (×3): 120 mg via ORAL
  Filled 2018-06-25 (×3): qty 1

## 2018-06-25 MED ORDER — PROPAFENONE HCL 225 MG PO TABS
225.0000 mg | ORAL_TABLET | Freq: Two times a day (BID) | ORAL | Status: DC
  Administered 2018-06-25 – 2018-06-27 (×5): 225 mg via ORAL
  Filled 2018-06-25 (×6): qty 1

## 2018-06-25 NOTE — H&P (Addendum)
[facility] NAME: Hannah Kline    MR#:  [id number]  DATE OF BIRTH:  August 24, 1932  DATE OF ADMISSION:  06/25/2018  PRIMARY CARE PHYSICIAN: Crecencio Mc, MD   REQUESTING/REFERRING PHYSICIAN: Dr Merlyn Lot  CHIEF COMPLAINT:   Chief Complaint  Patient presents with  . Back Pain  . Hip Pain    HISTORY OF PRESENT ILLNESS:  Hannah Kline  is a 84 y.o. female state that she is having some memory issues.  She had a fall at home a couple days back and has been having some back hip and thigh pain.  At 3:30 in the morning she woke up her husband with severe pain.  At that time they gave some morphine.  Patient still having pain and decided to come back into the ER for further evaluation.  She is followed at hospice at home for end-stage lung issues with bronchiectasis.  Hospitalist services were contacted for further evaluation.  Patient is not the best historian about her pain.  Currently states her pain is not too bad.  Of note her PCO2 was at 102 on venous blood gas.  PAST MEDICAL HISTORY:   Past Medical History:  Diagnosis Date  . Atrial fibrillation ([place])   . Bacterial infection due to Pseudomonas   . Bronchiectasis   . Chronic respiratory failure with hypoxia and hypercapnia (HCC)   . COPD (chronic obstructive pulmonary disease) ([place])    . Cough    from bronchiectasis neb txs  . Dysrhythmia    A Fib  . Hepatitis    h/o INH(isoniazide)  . History of Mycobacterium avium complex infection    lung disease  . Hypothyroidism   . Pleurisy 12/08/11  . Shortness of breath dyspnea     PAST SURGICAL HISTORY:   Past Surgical History:  Procedure Laterality Date  . CATARACT EXTRACTION W/PHACO Left 01/26/2016   Procedure: CATARACT EXTRACTION PHACO AND INTRAOCULAR LENS PLACEMENT (IOC);  Surgeon: Ronnell Freshwater, MD;  Location: [facility];  Service: Ophthalmology;  Laterality: Left;  . COLONOSCOPY    .  SHOULDER ARTHROSCOPY     right    SOCIAL HISTORY:   Social History   Tobacco Use  . Smoking status: Never Smoker  . Smokeless tobacco: Never Used  Substance Use Topics  . Alcohol use: Yes    Alcohol/week: 7.0 - 8.0 standard drinks    Types: 7 Glasses of wine per week    FAMILY HISTORY:   Family History  Problem Relation Age of Onset  . Angina Mother   . Other Mother        intestional blockage  . CAD Mother   . Diabetes Maternal Grandfather     DRUG ALLERGIES:   Allergies  Allergen Reactions  . Ciprofloxacin Other (See Comments)    c-diff  . Isoniazid     Hepatitis, weakness, nausea  . Moxifloxacin     Avelox  REACTION: chills, fainting    REVIEW OF SYSTEMS:  CONSTITUTIONAL: No fever, chills or sweats.  Positive for fatigue.  Positive for weight loss around 15 pounds over 6 months. EYES: No blurred or double vision.  Wears reading glasses EARS, NOSE, AND THROAT: No tinnitus or ear pain. No sore throat.  Positive for runny nose and postnasal drip RESPIRATORY: Positive cough with gray-beige phlegm.  Positive for shortness of breath.  No wheezing or hemoptysis.  CARDIOVASCULAR: No chest pain, orthopnea, edema.  GASTROINTESTINAL: Positive for nausea.  No vomiting, diarrhea or abdominal pain. No blood in bowel movements GENITOURINARY: No dysuria, hematuria.  ENDOCRINE: No polyuria, nocturia,  HEMATOLOGY: No anemia, easy bruising or bleeding SKIN: No rash or lesion. MUSCULOSKELETAL: Positive for back hip and posterior thigh pain NEUROLOGIC: No tingling, numbness, weakness.  PSYCHIATRY: No anxiety or depression.   MEDICATIONS AT HOME:   Prior to Admission medications   Medication Sig Start Date End Date Taking? Authorizing Provider  apixaban (ELIQUIS) 2.5 MG TABS tablet Take 1 tablet (2.5 mg total) by mouth 2 (two) times daily. 05/19/18  Yes Deboraha Sprang, MD  arformoterol [facility]) 15 MCG/2ML NEBU Take 15 mcg by nebulization 2 (two) times daily.   Yes [provider]  budesonide (PULMICORT) 0.5 MG/2ML nebulizer solution Take 0.5 mg by nebulization 2 (two) times daily.   Yes [provider]  [facility] SENNA PLUS 8.6-50 MG tablet Take 1-2 tablets by mouth daily.  01/13/18  Yes [provider]  diltiazem (CARDIZEM CD) 120 MG 24 hr capsule Take 1 capsule (120 mg total) by mouth daily. 06/13/18  Yes Gladstone Lighter, MD  dorzolamide (TRUSOPT) 2 % ophthalmic solution Place 1 drop into the left eye 2 (two) times daily. 04/20/17  Yes [provider]  fexofenadine (ALLEGRA) 180 MG tablet Take 1 tablet (180 mg total) by mouth 2 (two) times daily. Gradual increase 06/12/18  Yes Gladstone Lighter, MD  levothyroxine (SYNTHROID, LEVOTHROID) 75 MCG tablet Take 1 tablet (75 mcg total) by mouth daily. Patient taking differently: Take 75 mcg by mouth daily before breakfast.  04/06/18  Yes Crecencio Mc, MD  Morphine Sulfate (MORPHINE CONCENTRATE) 10 MG/0.5ML SOLN concentrated solution Take 0.25 mLs (5 mg total) by mouth every 2 (two) hours as needed for anxiety or shortness of breath. 06/12/18  Yes Gladstone Lighter, MD  polyethylene glycol [facility]) packet Take 17 g by mouth daily as needed for mild constipation. 06/12/18  Yes Gladstone Lighter, MD  propafenone (RYTHMOL) 225 MG tablet Take 1 tablet (225 mg total) by mouth every 12 (twelve) hours. 06/12/18  Yes Gladstone Lighter, MD  torsemide (DEMADEX) 20 MG tablet Take 1 tablet (20 mg total) by mouth daily. 06/13/18  Yes Gladstone Lighter, MD      VITAL SIGNS:  Blood pressure 120/77, pulse (!) 102, resp. rate 19, height _0  (1.727 m), weight 44.5 kg, SpO2 100 %.  PHYSICAL EXAMINATION:  GENERAL:  84 y.o.-year-old patient lying in the bed with no acute distress.  EYES: Pupils equal, round, reactive to light and accommodation. No scleral icterus. Extraocular muscles intact.  HEENT: Head atraumatic, normocephalic. Oropharynx and nasopharynx clear.  NECK:  Supple, no jugular venous distention. No  thyroid enlargement, no tenderness.  LUNGS: Decreased breath sounds bilaterally, scattered wheezing.  No rales,rhonchi or crepitation. No use of accessory muscles of respiration.  CARDIOVASCULAR: S1, S2 normal. No murmurs, rubs, or gallops.  ABDOMEN: Soft, nontender, nondistended. Bowel sounds present. No organomegaly or mass.  EXTREMITIES: No pedal edema, cyanosis, or clubbing.  Unable to elicit pain with palpation over the lumbar spine or back. NEUROLOGIC: Cranial nerves II through XII are intact. Muscle strength 5/5 in all extremities. Sensation intact. Gait not checked.  Patient able to straight leg raise. PSYCHIATRIC: The patient is alert and oriented x 3.  SKIN: No rash, lesion, or ulcer.   LABORATORY PANEL:   CBC Recent Labs  Lab 06/25/18 0436  WBC 22.5*  HGB 12.4  HCT 37.6  PLT 314   ------------------------------------------------------------------------------------------------------------------  Chemistries  Recent Labs  Lab 06/25/18 0436  NA 138  K 3.7  CL 91*  CO2 38*  GLUCOSE 116*  BUN 20  CREATININE 0.75  CALCIUM 8.6*   ------------------------------------------------------------------------------------------------------------------    RADIOLOGY:  Dg Chest 2 View  Result Date: 06/25/2018 CLINICAL DATA:  Low back pain beginning last night. Baseline shortness of breath. COPD. EXAM: CHEST - 2 VIEW COMPARISON:  06/07/2018 FINDINGS: Lungs are hyperexpanded demonstrate persistent diffuse bilateral interstitial prominence with patchy areas of mixed interstitial airspace density without significant overall change. There are a few areas which may be slightly worse as seen over the left upper lobe and right base. No effusion. Flattening of the hemidiaphragms on the lateral film. Mild stable cardiomegaly. Remainder of the exam is unchanged. IMPRESSION: Hyperexpanded lungs with chronic interstitial disease. There are a few subtle areas of mixed interstitial airspace  density slightly worse as this may be due to acute infection or inflammatory process on background of interstitial disease. Stable cardiomegaly. Electronically Signed   By: Marin Olp M.D.   On: 06/25/2018 07:19   Dg Lumbar Spine 2-3 Views  Result Date: 06/25/2018 CLINICAL DATA:  Initial evaluation for acute back pain status post recent fall. EXAM: LUMBAR SPINE - 2-3 VIEW COMPARISON:  Prior radiograph from 06/19/2018. FINDINGS: Levoscoliosis of the lower lumbar spine. Alignment otherwise normal with preservation of the normal lumbar lordosis. Vertebral body heights maintained with no evidence for acute vertebral fracture. Focal angulation of the sacrum, age indeterminate, but could reflect an acute fracture. No other acute osseous abnormality identified. Moderate facet arthrosis at L4-5 and L5-S1. Osteopenia. No soft tissue abnormality. IMPRESSION: 1. Focal angulation of the mid sacrum, age indeterminate. Correlation with physical exam for possible pain at this location recommended. Further assessment with cross-sectional imaging could be performed for further evaluation as clinically warranted. 2. Levoscoliosis with moderate facet arthrosis at L4-5 and L5-S1. Electronically Signed   By: Jeannine Boga M.D.   On: 06/25/2018 05:42   Ct Lumbar Spine Wo Contrast  Result Date: 06/25/2018 CLINICAL DATA:  Back pain, minor trauma EXAM: CT LUMBAR SPINE WITHOUT CONTRAST TECHNIQUE: Multidetector CT imaging of the lumbar spine was performed without intravenous contrast administration. Multiplanar CT image reconstructions were also generated. COMPARISON:  Lumbar radiographs 06/25/2018, chest x-ray 06/25/2018 FINDINGS: Segmentation: Normal Alignment: Normal Vertebrae: Moderate compression fracture of T11 appears acute Acute fracture of the sacrum extending to the left sacral ala appears nondisplaced. Possible fracture of the lower sacrum based on x-ray. This area is not included on the current CT. Paraspinal and  other soft tissues: Right lower lobe masslike density posterior medially measures 3.6 x 2.8 cm. This is incompletely imaged. Small right effusion. Extensive chronic lung disease. 3 mm nonobstructing midpole calculus. 4 cm left upper pole renal cyst. Disc levels: T12-L1: Negative L1-2: Negative L2-3: Negative L3-4: Mild disc bulging and mild facet degeneration. Negative for stenosis L4-5: Mild disc bulging and mild facet degeneration. Negative for stenosis L5-S1: Bilateral facet hypertrophy without significant stenosis. IMPRESSION: 1. Moderate compression fracture of T11 appears acute 2. Left-sided sacral fracture appears acute. Lower sacrum and coccyx not included on this study. Possible fracture in the mid sacrum noted on x-ray. 3. 3 mm nonobstructing left renal calculus 4. Right lower lobe masslike lesion measuring 3.6 x 2.8 cm. Recommend CT chest with contrast for further evaluation. Severe chronic lung disease with fibrosis. Electronically Signed   By: Franchot Gallo M.D.   On: 06/25/2018 09:46   Ct Hip Left Wo Contrast  Result Date: 06/25/2018 CLINICAL DATA:  Left hip pain after fall. EXAM: CT OF THE LEFT HIP WITHOUT CONTRAST TECHNIQUE: Multidetector CT imaging of the left hip was performed according to the standard protocol. Multiplanar CT image reconstructions were also generated. COMPARISON:  Left hip x-rays from same day. FINDINGS: Bones/Joint/Cartilage No acute fracture or dislocation. Mild left hip joint space narrowing with small marginal osteophytes. Osteopenia. No joint effusion. Ligaments Suboptimally assessed by CT. Muscles and Tendons Grossly unremarkable. Soft tissues No soft tissue mass or fluid collection. IMPRESSION: 1.  No acute osseous abnormality. 2. Mild left hip osteoarthritis. Electronically Signed   By: Titus Dubin M.D.   On: 06/25/2018 09:32   Dg Hip Unilat W Or Wo Pelvis 2-3 Views Left  Result Date: 06/25/2018 CLINICAL DATA:  Initial evaluation for acute back pain and left  hip pain status post recent fall. EXAM: DG HIP (WITH OR WITHOUT PELVIS) 2-3V LEFT COMPARISON:  None. FINDINGS: No acute fracture or dislocation. Femoral heads in normal alignment within the acetabula. Femoral head heights maintained. Bony pelvis intact. Mild osteoarthritic changes present about the hips bilaterally. Osteopenia. No soft tissue abnormality. Degenerative changes noted within the lower lumbar spine. IMPRESSION: No acute osseous abnormality about the left hip. Electronically Signed   By: Jeannine Boga M.D.   On: 06/25/2018 05:39    EKG:   Sinus tachycardia 105 bpm, LVH, nonspecific ST-T wave changes  IMPRESSION AND PLAN:   1.  Acute on chronic hypercapnic and hypoxic respiratory failure.  I explained to the patient and husband that I can use BiPAP to blow off this PCO2.  PCO2 was 102 on presentation.  Patient states that she does not want to try it now but will try it at night.  I will admit the patient to the floor for BiPAP at night to try to blow off the CO2.  Patient chronically wears 5 L of oxygen.  Would rather have her more hypoxic around 86% on the pulse ox.  Continue nebulizer treatments. 2.  Left-sided sacral fracture and T11 fracture.  Case discussed with patient and husband and they would like to do conservative management.  They would not like any surgical intervention at this time.  Pain control.  Physical therapy evaluation. 3.  Right lower lung mass.  Patient is followed by hospice.  History of MIA in the past.  Would defer further work-up at this time since the patient is already followed by hospice. 4.  Paroxysmal atrial fibrillation.  Continue Cardizem CD and Eliquis. 5.  Hypothyroidism unspecified continue levothyroxine 6.  Chronic respiratory failure with bronchiectasis and COPD.  Continue oxygen supplementation 7.  Elevated white blood cell count.  Start empiric antibiotic and send off a sputum culture.  Urinalysis is negative.  Check CBC tomorrow.   All the  records are reviewed and case discussed with ED provider. Management plans discussed with the patient, family and they are in agreement.  CODE STATUS: DNR  TOTAL TIME TAKING CARE OF THIS PATIENT: 55 minutes, includin acp time   Loletha Grayer M.D on 06/25/2018 at 11:30 AM  Between 7am to 6pm - Pager - [phone]  After 6pm call admission pager [phone]  [facility]  [phone]  CC: Primary care physician; Crecencio Mc, MD

## 2018-06-25 NOTE — ED Notes (Signed)
Patient transported to X-ray 

## 2018-06-25 NOTE — ED Notes (Signed)
Admitting Provider at bedside. 

## 2018-06-25 NOTE — ED Notes (Signed)
Pt placed on non rebreather for dip in pox to 84%. Pt with rebound pox to 23% after application of non rebreather.

## 2018-06-25 NOTE — ED Notes (Signed)
[place], RN with hospice would like status update when available.

## 2018-06-25 NOTE — Care Management (Addendum)
RNCM team received consult for possible trilogy NIV need. Patient currently on 5L chronic oxygen via hospice. Patient with [facility]. Venous blood gas has been drawn but no arterial. Placed form and example of correct documentation sheet in paper chart for MD to sign and left sticky note requesting ABG be performed. Patient currently agreeable to NIV treatment but if these wishes change we will no longer pursue since patient is hospice.

## 2018-06-25 NOTE — Progress Notes (Signed)
PT Cancellation Note  Patient Details Name: Ellysia Char MRN: [id number] DOB: 02-25-1932   Cancelled Treatment:    Reason Eval/Treat Not Completed: Other (comment).  Mult new fractures with no guidance and in pain, and will await more clear instructions regarding movement and pain management to see pt.   Ramond Dial 06/25/2018, 1:16 PM   Mee Hives, PT MS Acute Rehab Dept. Number: [place] and [place]

## 2018-06-25 NOTE — ED Triage Notes (Signed)
Pt from home with 5lpm [place] oxygen with back pain and bilateral leg pain. Pt with fall recently with back pain. Pt received oral morphine at home per ems.

## 2018-06-25 NOTE — ED Provider Notes (Signed)
Patient received in sign-out from Dr. Dahlia Client.  Workup and evaluation pending CT imaging.  Patient with extensive past medical history.  Does arrive under hospice care and is DNR.  Is complaining of severe leg pain and frailty.  Blood work did show evidence of significant elevation of bicarb therefore VBG ordered and shows PCO2 of 100.  Patient does appear little confused.  Discussed with patient and family goals of care and they do not want BiPAP at this point.  She does not appear to be overtly dyspneic from her baseline according to family at bedside.  Based on her deterioration and persistent pain I do feel patient will require hospitalization for pain management.  Will give additional nebulizer treatments.  CT imaging does show evidence of lumbar compression fracture as well as sacral fracture.  We will touch base with orthopedics as well as admit patient for additional pain management.      Merlyn Lot, MD 06/25/18 1007

## 2018-06-25 NOTE — ED Provider Notes (Signed)
[facility] Emergency Department Provider Note   ____________________________________________   First MD Initiated Contact with Patient 06/25/18 [phone]     (approximate)  I have reviewed the triage vital signs and the nursing notes.   HISTORY  Chief Complaint Back Pain and Hip Pain    HPI Hannah Kline is a 84 y.o. female who comes into the hospital today with some bilateral upper leg and lower back pain.  The patient was here 2 weeks ago and released.  When she was discharged to home she fell.  She was seen by her primary care physician and had some x-rays which were negative but she is scheduled for an MRI.  The patient is also under hospice care for bronchiectasis and shortness of breath.  She normally wears 5 L of oxygen by nasal cannula.  The patient reports that the pain is been going on for weeks and she rates her pain a 9 out of 10 in intensity.  She states that the pain is in her low back and in her legs.  The patient is here today for evaluation.  Past Medical History:  Diagnosis Date  . Atrial fibrillation ([place])   . Bacterial infection due to Pseudomonas   . Bronchiectasis   . COPD (chronic obstructive pulmonary disease) ([place])    . Cough    from bronchiectasis neb txs  . Dysrhythmia    A Fib  . Hepatitis    h/o INH(isoniazide)  . History of Mycobacterium avium complex infection    lung disease  . Hypothyroidism   . Pleurisy 12/08/11  . Shortness of breath dyspnea     Patient Active Problem List   Diagnosis Date Noted  . Low back pain radiating to both legs 06/20/2018  . Acute on chronic diastolic CHF (congestive heart failure) ([place]) 06/08/2018  . Protein-calorie malnutrition, severe 06/08/2018  . Respiratory failure, chronic ([place]) 10/15/2017  . 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 Leukocytosis 05/03/2017  . Mouth pain 05/03/2017  . Hospital discharge follow-up 05/03/2017  . Acute bronchitis with chronic obstructive pulmonary disease  (COPD) ([place]) 04/24/2017  . Shortness of breath 03/25/2017  . Pulmonary hypertension ([place]) 02/28/2017  . Paroxysmal A-fib ([place]) 06/14/2016  . Weakness generalized 05/27/2016  . Malaise and fatigue 11/24/2014  . Glucose intolerance (pre-diabetes) 11/24/2014  . Acquired hypothyroidism 09/02/2014  . Anorexia 07/14/2014  . Protein-calorie malnutrition, mild ([place]) 07/14/2014  . Abnormal mammogram 02/19/2014  . Osteoporosis 02/14/2014  . Visit for preventive health examination 01/08/2014  . History of Clostridium difficile colitis 11/23/2013  . Bronchiectasis with acute exacerbation ([place]) 10/24/2013  . Rhinitis, nonallergic 10/10/2012  . Orthostatic hypotension 09/22/2012  . Hypoxemia requiring supplemental oxygen 02/21/2012  . Chronic sinusitis 01/03/2012  . Pulmonary nodule 01/03/2012  . Screening for colon cancer 10/05/2011  . Screening for breast cancer 10/05/2011  . Bacterial infection due to Pseudomonas   . Atrial fibrillation ([place]) 09/15/2010  . BRONCHIECTASIS 09/15/2010  . Chronic bronchitis ([place]) 09/15/2010    Past Surgical History:  Procedure Laterality Date  . CATARACT EXTRACTION W/PHACO Left 01/26/2016   Procedure: CATARACT EXTRACTION PHACO AND INTRAOCULAR LENS PLACEMENT (IOC);  Surgeon: Ronnell Freshwater, MD;  Location: [place];  Service: Ophthalmology;  Laterality: Left;  . COLONOSCOPY    . SHOULDER ARTHROSCOPY     right    Prior to Admission medications   Medication Sig Start Date End Date Taking? Authorizing Provider  albuterol (PROVENTIL HFA;VENTOLIN HFA) 108 (90 Base) MCG/ACT inhaler Inhale 2 puffs into  the lungs every 4 (four) hours as needed for wheezing or shortness of breath. Patient not taking: Reported on 06/19/2018 10/04/17   Collene Gobble, MD  apixaban (ELIQUIS) 2.5 MG TABS tablet Take 1 tablet (2.5 mg total) by mouth 2 (two) times daily. 05/19/18   Deboraha Sprang, MD  arformoterol (BROVANA) 15 MCG/2ML NEBU Take 15 mcg by nebulization 2  (two) times daily.    [provider]  budesonide (PULMICORT) 0.5 MG/2ML nebulizer solution Take 0.5 mg by nebulization 2 (two) times daily.    [provider]  celecoxib (CELEBREX) 200 MG capsule Take 1 capsule (200 mg total) by mouth daily. 06/19/18   Crecencio Mc, MD  [employer] SENNA PLUS 8.6-50 MG tablet TAKE 1-2 TABS BY MOUTH TWICE DAILY 01/13/18   [provider]  diltiazem (CARDIZEM CD) 120 MG 24 hr capsule Take 1 capsule (120 mg total) by mouth daily. 06/13/18   Gladstone Lighter, MD  diltiazem (CARDIZEM) 30 MG tablet Take 1 tablet (30 mg total) by mouth 3 (three) times daily as needed (atrial fibrillation). Patient not taking: Reported on 06/19/2018 01/03/17   Minna Merritts, MD  dorzolamide (TRUSOPT) 2 % ophthalmic solution Place 1 drop into the left eye 2 (two) times daily. 04/20/17   [provider]  famotidine (PEPCID) 20 MG tablet Take 1 tablet (20 mg total) by mouth 2 (two) times daily. Patient taking differently: Take 20 mg by mouth daily as needed for heartburn.  12/12/17   Crecencio Mc, MD  fexofenadine (ALLEGRA) 180 MG tablet Take 1 tablet (180 mg total) by mouth 2 (two) times daily. Gradual increase 06/12/18   Gladstone Lighter, MD  levothyroxine (SYNTHROID, LEVOTHROID) 75 MCG tablet Take 1 tablet (75 mcg total) by mouth daily. Patient taking differently: Take 75 mcg by mouth daily before breakfast.  04/06/18   Crecencio Mc, MD  Morphine Sulfate (MORPHINE CONCENTRATE) 10 MG/0.5ML SOLN concentrated solution Take 0.25 mLs (5 mg total) by mouth every 2 (two) hours as needed for anxiety or shortness of breath. 06/12/18   Gladstone Lighter, MD  OXYGEN Inhale 5 L into the lungs continuous.     [provider]  polyethylene glycol (MIRALAX) packet Take 17 g by mouth daily as needed for mild constipation. 06/12/18   Gladstone Lighter, MD  propafenone (RYTHMOL) 225 MG tablet Take 1 tablet (225 mg total) by mouth every 12 (twelve) hours. 06/12/18    Gladstone Lighter, MD  Respiratory Therapy Supplies (FLUTTER) DEVI Use as directed 09/06/17   Parrett, Fonnie Mu, NP  torsemide (DEMADEX) 20 MG tablet Take 1 tablet (20 mg total) by mouth daily. 06/13/18   Gladstone Lighter, MD    Allergies Ciprofloxacin; Isoniazid; and Moxifloxacin  Family History  Problem Relation Age of Onset  . Angina Mother   . Other Mother        intestional blockage  . Diabetes Maternal Grandfather     Social History Social History   Tobacco Use  . Smoking status: Never Smoker  . Smokeless tobacco: Never Used  Substance Use Topics  . Alcohol use: Yes    Alcohol/week: 7.0 - 8.0 standard drinks    Types: 7 Glasses of wine per week  . Drug use: No    Review of Systems  Constitutional: No fever/chills Eyes: No visual changes. ENT: No sore throat. Cardiovascular: Denies chest pain. Respiratory: shortness of breath. Gastrointestinal: No abdominal pain.  No nausea, no vomiting.  No diarrhea.  No constipation. Genitourinary: Negative for dysuria.  Musculoskeletal: bilateral leg pain, back pain Skin: Negative for rash. Neurological: Negative for headaches, focal weakness or numbness.   ____________________________________________   PHYSICAL EXAM:  VITAL SIGNS: ED Triage Vitals  Enc Vitals Group     BP 06/25/18 0429 140/80     Pulse Rate 06/25/18 0429 (!) 105     Resp 06/25/18 0427 (!) 22     Temp --      Temp Source 06/25/18 0429 Oral     SpO2 06/25/18 0429 94 %     Weight 06/25/18 0428 98 lb 2 oz (44.5 kg)     Height 06/25/18 0428 5' 8"  (1.727 m)     Head Circumference --      Peak Flow --      Pain Score 06/25/18 0427 9     Pain Loc --      Pain Edu? --      Excl. in [place]? --     Constitutional: Alert and oriented. Well appearing and in moderate distress. Eyes: Conjunctivae are normal. PERRL. EOMI. Head: Atraumatic. Nose: No congestion/rhinnorhea. Mouth/Throat: Mucous membranes are moist.  Oropharynx non-erythematous. Cardiovascular:  Normal rate, regular rhythm. Grossly normal heart sounds.  Good peripheral circulation. Respiratory: Normal respiratory effort.  No retractions. Lungs CTAB. Gastrointestinal: Soft and nontender. No distention. Positive bowel sounds Musculoskeletal: No lower extremity tenderness nor edema.  Neurologic:  Normal speech and language.  Skin:  Skin is warm, dry and intact.  Psychiatric: Mood and affect are normal.   ____________________________________________   LABS (all labs ordered are listed, but only abnormal results are displayed)  Labs Reviewed  CBC - Abnormal; Notable for the following components:      Result Value   WBC 22.5 (*)    All other components within normal limits  BASIC METABOLIC PANEL - Abnormal; Notable for the following components:   Chloride 91 (*)    CO2 38 (*)    Glucose, Bld 116 (*)    Calcium 8.6 (*)    All other components within normal limits  URINALYSIS, COMPLETE (UACMP) WITH MICROSCOPIC - Abnormal; Notable for the following components:   Color, Urine YELLOW (*)    APPearance CLEAR (*)    Bacteria, UA RARE (*)    All other components within normal limits  LACTIC ACID, PLASMA  LACTIC ACID, PLASMA   ____________________________________________  EKG  ED ECG REPORT I, Loney Hering, the attending physician, personally viewed and interpreted this ECG.   Date: 06/25/2018  EKG Time: 435  Rate: 105  Rhythm: sinus tachycardia  Axis: left axis deviation  Intervals:right bundle branch block  ST&T Change: Flipped T waves in leads V2 with no significant ST segment elevation.  ____________________________________________  RADIOLOGY  ED MD interpretation:  CXR: Hyperexpanded lungs with chronic interstitial disease, there are a few subtle areas of mixed interstitial airspace densities slightly worse as this may be due to acute infection or inflammatory process on a background of interstitial disease  Lumbar spine x-ray: Focal angulation of the mid  sacrum, age-indeterminate, levoscoliosis with moderate facet arthrosis at L 4/5 and L5/S1  Left hip x-ray: No acute osseous abnormality about the left hip.  Official radiology report(s): Dg Chest 2 View  Result Date: 06/25/2018 CLINICAL DATA:  Low back pain beginning last night. Baseline shortness of breath. COPD. EXAM: CHEST - 2 VIEW COMPARISON:  06/07/2018 FINDINGS: Lungs are hyperexpanded demonstrate persistent diffuse bilateral interstitial prominence with patchy areas of mixed interstitial airspace density without significant overall change. There are a  few areas which may be slightly worse as seen over the left upper lobe and right base. No effusion. Flattening of the hemidiaphragms on the lateral film. Mild stable cardiomegaly. Remainder of the exam is unchanged. IMPRESSION: Hyperexpanded lungs with chronic interstitial disease. There are a few subtle areas of mixed interstitial airspace density slightly worse as this may be due to acute infection or inflammatory process on background of interstitial disease. Stable cardiomegaly. Electronically Signed   By: Marin Olp M.D.   On: 06/25/2018 07:19   Dg Lumbar Spine 2-3 Views  Result Date: 06/25/2018 CLINICAL DATA:  Initial evaluation for acute back pain status post recent fall. EXAM: LUMBAR SPINE - 2-3 VIEW COMPARISON:  Prior radiograph from 06/19/2018. FINDINGS: Levoscoliosis of the lower lumbar spine. Alignment otherwise normal with preservation of the normal lumbar lordosis. Vertebral body heights maintained with no evidence for acute vertebral fracture. Focal angulation of the sacrum, age indeterminate, but could reflect an acute fracture. No other acute osseous abnormality identified. Moderate facet arthrosis at L4-5 and L5-S1. Osteopenia. No soft tissue abnormality. IMPRESSION: 1. Focal angulation of the mid sacrum, age indeterminate. Correlation with physical exam for possible pain at this location recommended. Further assessment with  cross-sectional imaging could be performed for further evaluation as clinically warranted. 2. Levoscoliosis with moderate facet arthrosis at L4-5 and L5-S1. Electronically Signed   By: Jeannine Boga M.D.   On: 06/25/2018 05:42   Dg Hip Unilat W Or Wo Pelvis 2-3 Views Left  Result Date: 06/25/2018 CLINICAL DATA:  Initial evaluation for acute back pain and left hip pain status post recent fall. EXAM: DG HIP (WITH OR WITHOUT PELVIS) 2-3V LEFT COMPARISON:  None. FINDINGS: No acute fracture or dislocation. Femoral heads in normal alignment within the acetabula. Femoral head heights maintained. Bony pelvis intact. Mild osteoarthritic changes present about the hips bilaterally. Osteopenia. No soft tissue abnormality. Degenerative changes noted within the lower lumbar spine. IMPRESSION: No acute osseous abnormality about the left hip. Electronically Signed   By: Jeannine Boga M.D.   On: 06/25/2018 05:39    ____________________________________________   PROCEDURES  Procedure(s) performed: None  Procedures  Critical Care performed: No  ____________________________________________   INITIAL IMPRESSION / ASSESSMENT AND PLAN / ED COURSE  As part of my medical decision making, I reviewed the following data within the electronic MEDICAL RECORD NUMBER Notes from prior ED visits and [place] Controlled Substance Database  This is an 84 year old female who comes into the hospital today with some hip pain and some back pain.  The patient reports that she fell approximately a week to 2 weeks ago when she arrived home from the hospital previously.  She states that she could not tolerate the pain anymore.  She had x-rays done by her primary care physician which were negative.  She has an MRI scheduled in the next 10 days but she could not wait.  The patient was hypoxic when she arrived into the low 80s on her 5 L.  We did decide to check some blood work on the patient as well as some imaging studies.  The  patient CBC shows a white blood cell count of 22.5 and her BMP is unremarkable.  The patient's urinalysis and lactic acid are negative.  The patient's chest x-ray shows her bronchiectasis and her lumbar spine and hip x-rays are negative.  I will send the patient for CT scan.  The patient's care will be signed out to Dr. Quentin Cornwall who will follow up the results of  the CT scan and help determine the patient's disposition.      ____________________________________________   FINAL CLINICAL IMPRESSION(S) / ED DIAGNOSES  Final diagnoses:  Acute bilateral low back pain without sciatica  Pain of left hip joint  Hypoxia     ED Discharge Orders    None       Note:  This document was prepared using Dragon voice recognition software and may include unintentional dictation errors.    Loney Hering, MD 06/25/18 [phone]

## 2018-06-25 NOTE — Progress Notes (Signed)
Bipap refused. Upon trying the mask on, the pt immediately felt panicked & said "I cant do this".

## 2018-06-25 NOTE — Progress Notes (Addendum)
Notified Dr. Leslye Peer patient complaining of increased SOB, received verbal order for ativan 0.5mg  2 times daily PRN, respirations 30BPM, 02 sats 91% on 4L. Continuing to monitor.

## 2018-06-25 NOTE — Progress Notes (Signed)
Patient ID: Hannah Kline, female   DOB: 1932-06-29, 84 y.o.   MRN: [id number]  ACP note  Patient and husband at the bedside  Diagnosis: Acute on chronic hypercapnic and hypoxic respiratory failure, COPD and bronchiectasis, left-sided sacral fracture and T11 fracture, right lower lung mass, paroxysmal atrial fibrillation, hypothyroidism unspecified, chronic respiratory failure.  CODE STATUS discussed and confirmed that the patient is a DO NOT RESUSCITATE.  Patient is also followed by hospice.  Plan.  BiPAP at night to blow off CO2, pain control for left-sided sacral fracture and T11 fracture.  Patient not interested in any aggressive measures with BiPAP during the day.  Patient not interested in any surgical procedures for left-sided sacral fracture and T11 fracture.  Patient not interested in working up his right lower lung mass seen on chest x-ray.  Get physical therapy evaluation.  Time spent on ACP discussion 18 minutes Dr. Loletha Grayer

## 2018-06-25 NOTE — ED Notes (Signed)
Patient transported to CT 

## 2018-06-25 NOTE — ED Notes (Signed)
Report to hunter, rn.

## 2018-06-25 NOTE — Plan of Care (Signed)

## 2018-06-26 DIAGNOSIS — M25552 Pain in left hip: Secondary | ICD-10-CM

## 2018-06-26 DIAGNOSIS — Z7189 Other specified counseling: Secondary | ICD-10-CM

## 2018-06-26 DIAGNOSIS — J9621 Acute and chronic respiratory failure with hypoxia: Principal | ICD-10-CM

## 2018-06-26 DIAGNOSIS — R0902 Hypoxemia: Secondary | ICD-10-CM

## 2018-06-26 DIAGNOSIS — M545 Low back pain: Secondary | ICD-10-CM

## 2018-06-26 DIAGNOSIS — J9622 Acute and chronic respiratory failure with hypercapnia: Secondary | ICD-10-CM

## 2018-06-26 DIAGNOSIS — Z515 Encounter for palliative care: Secondary | ICD-10-CM

## 2018-06-26 DIAGNOSIS — Z66 Do not resuscitate: Secondary | ICD-10-CM

## 2018-06-26 LAB — CBC
HEMATOCRIT: 33.2 % — AB (ref 35.0–47.0)
HEMOGLOBIN: 11 g/dL — AB (ref 12.0–16.0)
MCH: 31.9 pg (ref 26.0–34.0)
MCHC: 33.2 g/dL (ref 32.0–36.0)
MCV: 96 fL (ref 80.0–100.0)
Platelets: 255 10*3/uL (ref 150–440)
RBC: 3.46 MIL/uL — AB (ref 3.80–5.20)
RDW: 14.4 % (ref 11.5–14.5)
WBC: 14.7 10*3/uL — ABNORMAL HIGH (ref 3.6–11.0)

## 2018-06-26 LAB — BASIC METABOLIC PANEL
ANION GAP: 8 (ref 5–15)
BUN: 24 mg/dL — ABNORMAL HIGH (ref 8–23)
CHLORIDE: 92 mmol/L — AB (ref 98–111)
CO2: 38 mmol/L — AB (ref 22–32)
CREATININE: 0.69 mg/dL (ref 0.44–1.00)
Calcium: 8.5 mg/dL — ABNORMAL LOW (ref 8.9–10.3)
GFR calc non Af Amer: >60 mL/min (ref >60)
Glucose, Bld: 97 mg/dL (ref 70–99)
POTASSIUM: 3.9 mmol/L (ref 3.5–5.1)
Sodium: 138 mmol/L (ref 135–145)

## 2018-06-26 MED ORDER — IPRATROPIUM-ALBUTEROL 0.5-2.5 (3) MG/3ML IN SOLN
3.0000 mL | Freq: Three times a day (TID) | RESPIRATORY_TRACT | Status: DC
  Administered 2018-06-26 – 2018-06-27 (×2): 3 mL via RESPIRATORY_TRACT
  Filled 2018-06-26 (×3): qty 3

## 2018-06-26 MED ORDER — MELOXICAM 7.5 MG PO TABS
15.0000 mg | ORAL_TABLET | Freq: Every day | ORAL | Status: DC
  Administered 2018-06-26 – 2018-06-27 (×2): 15 mg via ORAL
  Filled 2018-06-26 (×2): qty 2

## 2018-06-26 MED ORDER — GUAIFENESIN ER 600 MG PO TB12: 600.0000 mg | ORAL_TABLET | Freq: Two times a day (BID) | ORAL | Status: DC

## 2018-06-26 MED ORDER — OXYCODONE HCL 5 MG PO TABS
5.0000 mg | ORAL_TABLET | Freq: Four times a day (QID) | ORAL | Status: DC
  Administered 2018-06-26 – 2018-06-27 (×4): 5 mg via ORAL
  Filled 2018-06-26 (×4): qty 1

## 2018-06-26 MED ORDER — IPRATROPIUM-ALBUTEROL 0.5-2.5 (3) MG/3ML IN SOLN: 3.0000 mL | Freq: Four times a day (QID) | RESPIRATORY_TRACT | Status: DC | PRN

## 2018-06-26 MED ORDER — GUAIFENESIN ER 600 MG PO TB12
600.0000 mg | ORAL_TABLET | Freq: Two times a day (BID) | ORAL | Status: DC
  Administered 2018-06-26 – 2018-06-27 (×3): 600 mg via ORAL
  Filled 2018-06-26 (×3): qty 1

## 2018-06-26 MED ORDER — GABAPENTIN 100 MG PO CAPS
100.0000 mg | ORAL_CAPSULE | Freq: Three times a day (TID) | ORAL | Status: DC
  Filled 2018-06-26 (×3): qty 1

## 2018-06-26 NOTE — Progress Notes (Signed)
PT Cancellation Note  Patient Details Name: Hannah Kline MRN: [id number] DOB: 1932/05/31   Cancelled Treatment:    Reason Eval/Treat Not Completed: Patient declined, no reason specified.  PT consult received.  Chart reviewed.  Pt initially agreeable to getting OOB and up to chair with therapy (pt reports being able to ambulate with assist prior to hospital admission and wanted to be able to walk upon hospital discharge) but then reported her pain was not well enough managed to do therapy today (but pt wanted to do therapy tomorrow); nurse notified.  Will re-attempt PT evaluation tomorrow.  Leitha Bleak, PT 06/26/18, 4:10 PM [phone]

## 2018-06-26 NOTE — Progress Notes (Signed)
Chaplain received an OR to update or complete an AD. Upon arrival to the room, patient was found resting, but awake and alert. Husband was at the bedside. Patient noted that she had not asked for an AD, but already had a living will and HPOA in place. She stated that she is ready Physician stopped by to make rounds, so visit concluded. The patient and family thanked chaplain for stopping by.    06/26/18 0900  Clinical Encounter Type  Visited With Patient and family together  Visit Type Initial

## 2018-06-26 NOTE — Consult Note (Signed)
                                                                                 Consultation Note Date: 06/26/2018   Patient Name: Hannah Kline  DOB: 10/11/1932  MRN: [id number]  Age / Sex: 84 y.o., female  PCP: Tullo, Teresa L, MD Referring Physician: Salary, Montell D, MD  Reason for Consultation: Establishing goals of care  HPI/Patient Profile: 84 y.o. female admitted on 06/25/2018 from home with hospice services, with complaints of back, hip, and thigh pain s/p fall a few days prior. She has a past medical history significant for atrial fibrillation, bronchiectasis, chronic respiratory failure with hypoxia and hypercapnia, COPD (5L home oxygen), and hypothyroidism. During ED course patient reported having severe pain despite home management. She has hospice at home for end-stage bronchiectasis. Her PCO2 was 102. WBC 22.5 She was started on antibiotics. UA was negative. CT of L-spine showed compression fracture of T11, left-sided sacral fracture, possible mid sacral fracture, and right lower lobe masslike lesion. Patient and spouse declined further work-up or surgical intervention. Since admission patient continues to have uncontrolled pain and discomfort related to her fractures. Palliative Medicine team consulted for goals of care.   Clinical Assessment and Goals of Care: I have reviewed medical records including lab results, imaging, Epic notes, and MAR, received report from the bedside RN, and assessed the patient. I then met at the bedside with patient and her husband, Hannah to discuss diagnosis prognosis, GOC, EOL wishes, disposition and options. Patient is A&O x3. She complains of pain in her sacral area and lower back. Bedside, RN aware and preparing to administer pain medication. Patient is able to engage in goals of care discussion.   I re-introduced Palliative Medicine as specialized medical care for people living with serious illness. It focuses on providing relief from the symptoms  and stress of a serious illness. The goal is to improve quality of life for both the patient and the family. Patient currently has hospice care in the home.   We discussed a brief life review of the patient. She is a reitred school teacher and graphic designer. She has been married to her husband for more than 25 years. She has 5 children (1 deceased). She states she has not spoken to several of them in many years. She loves reading and traveling. She lived in [place] for more than 12 years.   As far as functional and nutritional status patient health has declined significantly over the past years. She reports she doesn't have much appetite and requires assistance with ADLs. Husband reports she uses a cane when walking and he must also assist her due to weakness. She has loss some weight over the past few months also.   We discussed her current illness and what it means in the larger context of her on-going co-morbidities.  Natural disease trajectory and expectations at EOL were discussed. Patient and husband both verbalized their understanding of her condition. They are aware that "her time is limited on earth". Patient states her main goal is to not suffer or continue to be in pain the way she currently is.   I attempted to   elicit values and goals of care important to the patient. We discussed her current pain regimen and adding in more of a scheduled regimen to assist with promoting comfort and decreasing pain and discomfort. Patient and husband verbalized understanding. They are also aware that patient will continue with as needed medication that can be used for breakthrough pain throughout the day and night. Husband verbalized understanding and is aware of the importance of continuing with regimen at discharge to promote comfort for patient.    Both patient and husband confirms she is a DNR/DNI. They would like to return home with their current hospice services in place.   Questions and  concerns were addressed. The family was encouraged to call with questions or concerns.  PMT will continue to support holistically.  Primary Decision Maker: Patient is A&O x3 and capable of making medical decisions. In the event patient is not able to make decisions her husband Hannah Kline is her documented POA.   SUMMARY OF RECOMMENDATIONS    DNR/DNI-as confirmed by patient and husband  Continue to treat the treatable while hospitalized without aggressive measures or escalation in care.   Patient currently has outpatient hospice services and would like to return home and continue with their services.   Mucinex BID for congestion.   Ativan PRN for anxiety  Roxanol 5mg PRN for pain/shortness of breath  Zofran PRN for nausea   Oxy IR will be schedule every 6hrs for pain as discussed with Dr. Salary.   Palliative Medicine team will continue to support patient, family, and medical team during hospitalization.   Code Status/Advance Care Planning:  DNR/DNI   Symptom Management:   Mucinex BID for congestion.   Ativan PRN for anxiety  Roxanol 5mg PRN for pain/shortness of breath  Zofran PRN for nausea   Oxy IR will be schedule every 6hrs for pain  Palliative Prophylaxis:   Aspiration, Bowel Regimen, Delirium Protocol, Frequent Pain Assessment and Turn Reposition  Additional Recommendations (Limitations, Scope, Preferences):  Full Scope Treatment-continue to treat the treatable without escalation of care.   Psycho-social/Spiritual:   Desire for further Chaplaincy support:no  Prognosis:   < 6 months-in the setting of fall, fractured sacrum, compression fracture T11, COPD, advanced bronchiectasis, poor po intake, weight loss, decreased mobility, chronic respiratory failure with hypoxia and hypercapnia, a-fib, and right lower lung mass.   Discharge Planning: Home with Hospice      Primary Diagnoses: Present on Admission: . Acute on chronic respiratory failure with  hypoxia and hypercapnia (HCC)   I have reviewed the medical record, interviewed the patient and family, and examined the patient. The following aspects are pertinent.  Past Medical History:  Diagnosis Date  . Atrial fibrillation (HCC)   . Bacterial infection due to Pseudomonas   . Bronchiectasis   . Chronic respiratory failure with hypoxia and hypercapnia (HCC)   . COPD (chronic obstructive pulmonary disease) (HCC)    . Cough    from bronchiectasis neb txs  . Dysrhythmia    A Fib  . Hepatitis    h/o INH(isoniazide)  . History of Mycobacterium avium complex infection    lung disease  . Hypothyroidism   . Pleurisy 12/08/11  . Shortness of breath dyspnea    Social History   Socioeconomic History  . Marital status: Married    Spouse name: Not on file  . Number of children: 5  . Years of education: Not on file  . Highest education level: Not on file  Occupational History  .   Occupation: Retired-Interior Design    Employer: retired  Social Needs  . Financial resource strain: Not hard at all  . Food insecurity:    Worry: Never true    Inability: Never true  . Transportation needs:    Medical: No    Non-medical: No  Tobacco Use  . Smoking status: Never Smoker  . Smokeless tobacco: Never Used  Substance and Sexual Activity  . Alcohol use: Yes    Alcohol/week: 7.0 - 8.0 standard drinks    Types: 7 Glasses of wine per week  . Drug use: No  . Sexual activity: Not Currently  Lifestyle  . Physical activity:    Days per week: Not on file    Minutes per session: Not on file  . Stress: Not on file  Relationships  . Social connections:    Talks on phone: Not on file    Gets together: Not on file    Attends religious service: Not on file    Active member of club or organization: Not on file    Attends meetings of clubs or organizations: Not on file    Relationship status: Not on file  Other Topics Concern  . Not on file  Social History Narrative  . Not on file    Family History  Problem Relation Age of Onset  . Angina Mother   . Other Mother        intestional blockage  . CAD Mother   . Diabetes Maternal Grandfather    Scheduled Meds: . apixaban  2.5 mg Oral BID  . arformoterol  15 mcg Nebulization BID  . azithromycin  250 mg Oral Daily  . budesonide  0.5 mg Nebulization BID  . diltiazem  120 mg Oral Daily  . dorzolamide  1 drop Left Eye BID  . gabapentin  100 mg Oral TID  . guaiFENesin  600 mg Oral BID  . ipratropium-albuterol  3 mL Nebulization Q6H  . levothyroxine  75 mcg Oral QAC breakfast  . loratadine  10 mg Oral Daily  . meloxicam  15 mg Oral Daily  . oxyCODONE  5 mg Oral Q6H  . propafenone  225 mg Oral Q12H  . senna-docusate  2 tablet Oral Daily  . torsemide  20 mg Oral Daily   Continuous Infusions: . cefTRIAXone (ROCEPHIN)  IV Stopped (06/25/18 1527)   PRN Meds:.acetaminophen **OR** acetaminophen, LORazepam, morphine injection, morphine CONCENTRATE, ondansetron **OR** ondansetron (ZOFRAN) IV, polyethylene glycol Medications Prior to Admission:  Prior to Admission medications   Medication Sig Start Date End Date Taking? Authorizing Provider  apixaban (ELIQUIS) 2.5 MG TABS tablet Take 1 tablet (2.5 mg total) by mouth 2 (two) times daily. 05/19/18  Yes Klein, Steven C, MD  arformoterol (BROVANA) 15 MCG/2ML NEBU Take 15 mcg by nebulization 2 (two) times daily.   Yes [provider]  budesonide (PULMICORT) 0.5 MG/2ML nebulizer solution Take 0.5 mg by nebulization 2 (two) times daily.   Yes [provider]  [facility] SENNA PLUS 8.6-50 MG tablet Take 1-2 tablets by mouth daily.  01/13/18  Yes [provider]  diltiazem (CARDIZEM CD) 120 MG 24 hr capsule Take 1 capsule (120 mg total) by mouth daily. 06/13/18  Yes Kalisetti, Radhika, MD  dorzolamide (TRUSOPT) 2 % ophthalmic solution Place 1 drop into the left eye 2 (two) times daily. 04/20/17  Yes [provider]  fexofenadine (ALLEGRA) 180 MG tablet Take 1  tablet (180 mg total) by mouth 2 (two) times daily. Gradual increase 06/12/18    Yes Kalisetti, Radhika, MD  levothyroxine (SYNTHROID, LEVOTHROID) 75 MCG tablet Take 1 tablet (75 mcg total) by mouth daily. Patient taking differently: Take 75 mcg by mouth daily before breakfast.  04/06/18  Yes Tullo, Teresa L, MD  Morphine Sulfate (MORPHINE CONCENTRATE) 10 MG/0.5ML SOLN concentrated solution Take 0.25 mLs (5 mg total) by mouth every 2 (two) hours as needed for anxiety or shortness of breath. 06/12/18  Yes Kalisetti, Radhika, MD  polyethylene glycol (MIRALAX) packet Take 17 g by mouth daily as needed for mild constipation. 06/12/18  Yes Kalisetti, Radhika, MD  propafenone (RYTHMOL) 225 MG tablet Take 1 tablet (225 mg total) by mouth every 12 (twelve) hours. 06/12/18  Yes Kalisetti, Radhika, MD  torsemide (DEMADEX) 20 MG tablet Take 1 tablet (20 mg total) by mouth daily. 06/13/18  Yes Kalisetti, Radhika, MD   Allergies  Allergen Reactions  . Ciprofloxacin Other (See Comments)    c-diff  . Isoniazid     Hepatitis, weakness, nausea  . Moxifloxacin     Avelox  REACTION: chills, fainting   Review of Systems  Constitutional: Positive for activity change and appetite change.  Respiratory: Positive for cough.        Congestion   Musculoskeletal:       Joint/bone pain  Neurological: Positive for weakness.  All other systems reviewed and are negative.   Physical Exam  Constitutional: She is oriented to person, place, and time. Vital signs are normal. She is cooperative. She appears ill.  Thin and frail in appearance   Cardiovascular: Regular rhythm and normal heart sounds. Tachycardia present. Exam reveals decreased pulses.  Pulmonary/Chest: Effort normal. She has decreased breath sounds. She has wheezes.  Neurological: She is alert and oriented to person, place, and time.  Skin: Skin is warm and dry. Bruising noted.  Psychiatric: Judgment normal. Cognition and memory are normal.  Nursing note and vitals  reviewed.   Vital Signs: BP 115/64 (BP Location: Right Arm)   Pulse 100   Temp 97.7 F (36.5 C) (Oral)   Resp 18   Ht 5' 8" (1.727 m)   Wt 44.5 kg   SpO2 93%   BMI 14.92 kg/m  Pain Scale: 0-10   Pain Score: 10-Worst pain ever   SpO2: SpO2: 93 % O2 Device:SpO2: 93 % O2 Flow Rate: .O2 Flow Rate (L/min): 5 L/min  IO: Intake/output summary:   Intake/Output Summary (Last 24 hours) at 06/26/2018 1257 Last data filed at 06/26/2018 1113 Gross per 24 hour  Intake 0 ml  Output 1100 ml  Net -1100 ml    LBM: Last BM Date: 06/25/18 Baseline Weight: Weight: 44.5 kg Most recent weight: Weight: 44.5 kg     Palliative Assessment/Data: PPS 30%   Time In: 1100 Time Out: 1215 Time Total: 75 min.   Greater than 50%  of this time was spent counseling and coordinating care related to the above assessment and plan.  Signed by:  Nikki Pickenpack-Cousar, NP-BC Palliative Medicine Team  Phone: [phone] Fax: [phone] Pager: [phone] Amion: N. Cousar    Please contact Palliative Medicine Team phone at [phone] for questions and concerns.  For individual provider: See Amion             

## 2018-06-26 NOTE — Progress Notes (Signed)
[facility] NAME: Hannah Kline    MR#:  [id number]  DATE OF BIRTH:  1931-11-21  SUBJECTIVE:  CHIEF COMPLAINT:   Chief Complaint  Patient presents with  . Back Pain  . Hip Pain  Patient and the patient's husband are not interested in any operative intervention/orthopedic surgery consultation or bracing of her T11 vertebral fracture, they states she has pain primarily with sitting-dominant ordered, patient continues to complain repeatedly of severe pain-discussed with hospice nurse Karen-we will schedule Mobic/Neurontin/oxycodone for now, await further hospice recommendations  REVIEW OF SYSTEMS:  CONSTITUTIONAL: No fever, fatigue or weakness.  EYES: No blurred or double vision.  EARS, NOSE, AND THROAT: No tinnitus or ear pain.  RESPIRATORY: No cough, shortness of breath, wheezing or hemoptysis.  CARDIOVASCULAR: No chest pain, orthopnea, edema.  GASTROINTESTINAL: No nausea, vomiting, diarrhea or abdominal pain.  GENITOURINARY: No dysuria, hematuria.  ENDOCRINE: No polyuria, nocturia,  HEMATOLOGY: No anemia, easy bruising or bleeding SKIN: No rash or lesion. MUSCULOSKELETAL: No joint pain or arthritis.   NEUROLOGIC: No tingling, numbness, weakness.  PSYCHIATRY: No anxiety or depression.   ROS  DRUG ALLERGIES:   Allergies  Allergen Reactions  . Ciprofloxacin Other (See Comments)    c-diff  . Isoniazid     Hepatitis, weakness, nausea  . Moxifloxacin     Avelox  REACTION: chills, fainting    VITALS:  Blood pressure 115/64, pulse 100, temperature 97.7 F (36.5 C), temperature source Oral, resp. rate 18, height 5\' 8"  (1.727 m), weight 44.5 kg, SpO2 93 %.  PHYSICAL EXAMINATION:  GENERAL:  84 y.o.-year-old patient lying in the bed with no acute distress.  EYES: Pupils equal, round, reactive to light and accommodation. No scleral icterus. Extraocular muscles intact.  HEENT: Head atraumatic, normocephalic. Oropharynx and  nasopharynx clear.  NECK:  Supple, no jugular venous distention. No thyroid enlargement, no tenderness.  LUNGS: Normal breath sounds bilaterally, no wheezing, rales,rhonchi or crepitation. No use of accessory muscles of respiration.  CARDIOVASCULAR: S1, S2 normal. No murmurs, rubs, or gallops.  ABDOMEN: Soft, nontender, nondistended. Bowel sounds present. No organomegaly or mass.  EXTREMITIES: No pedal edema, cyanosis, or clubbing.  NEUROLOGIC: Cranial nerves II through XII are intact. Muscle strength 5/5 in all extremities. Sensation intact. Gait not checked.  PSYCHIATRIC: The patient is alert and oriented x 3.  SKIN: No obvious rash, lesion, or ulcer.   Physical Exam LABORATORY PANEL:   CBC Recent Labs  Lab 06/26/18 0456  WBC 14.7*  HGB 11.0*  HCT 33.2*  PLT 255   ------------------------------------------------------------------------------------------------------------------  Chemistries  Recent Labs  Lab 06/26/18 0456  NA 138  K 3.9  CL 92*  CO2 38*  GLUCOSE 97  BUN 24*  CREATININE 0.69  CALCIUM 8.5*   ------------------------------------------------------------------------------------------------------------------  Cardiac Enzymes No results for input(s): TROPONINI in the last 168 hours. ------------------------------------------------------------------------------------------------------------------  RADIOLOGY:  Dg Chest 2 View  Result Date: 06/25/2018 CLINICAL DATA:  Low back pain beginning last night. Baseline shortness of breath. COPD. EXAM: CHEST - 2 VIEW COMPARISON:  06/07/2018 FINDINGS: Lungs are hyperexpanded demonstrate persistent diffuse bilateral interstitial prominence with patchy areas of mixed interstitial airspace density without significant overall change. There are a few areas which may be slightly worse as seen over the left upper lobe and right base. No effusion. Flattening of the hemidiaphragms on the lateral film. Mild stable cardiomegaly.  Remainder of the exam is unchanged. IMPRESSION: Hyperexpanded lungs with chronic interstitial disease. There are a few subtle  areas of mixed interstitial airspace density slightly worse as this may be due to acute infection or inflammatory process on background of interstitial disease. Stable cardiomegaly. Electronically Signed   By: Marin Olp M.D.   On: 06/25/2018 07:19   Dg Lumbar Spine 2-3 Views  Result Date: 06/25/2018 CLINICAL DATA:  Initial evaluation for acute back pain status post recent fall. EXAM: LUMBAR SPINE - 2-3 VIEW COMPARISON:  Prior radiograph from 06/19/2018. FINDINGS: Levoscoliosis of the lower lumbar spine. Alignment otherwise normal with preservation of the normal lumbar lordosis. Vertebral body heights maintained with no evidence for acute vertebral fracture. Focal angulation of the sacrum, age indeterminate, but could reflect an acute fracture. No other acute osseous abnormality identified. Moderate facet arthrosis at L4-5 and L5-S1. Osteopenia. No soft tissue abnormality. IMPRESSION: 1. Focal angulation of the mid sacrum, age indeterminate. Correlation with physical exam for possible pain at this location recommended. Further assessment with cross-sectional imaging could be performed for further evaluation as clinically warranted. 2. Levoscoliosis with moderate facet arthrosis at L4-5 and L5-S1. Electronically Signed   By: Jeannine Boga M.D.   On: 06/25/2018 05:42   Ct Lumbar Spine Wo Contrast  Result Date: 06/25/2018 CLINICAL DATA:  Back pain, minor trauma EXAM: CT LUMBAR SPINE WITHOUT CONTRAST TECHNIQUE: Multidetector CT imaging of the lumbar spine was performed without intravenous contrast administration. Multiplanar CT image reconstructions were also generated. COMPARISON:  Lumbar radiographs 06/25/2018, chest x-ray 06/25/2018 FINDINGS: Segmentation: Normal Alignment: Normal Vertebrae: Moderate compression fracture of T11 appears acute Acute fracture of the sacrum  extending to the left sacral ala appears nondisplaced. Possible fracture of the lower sacrum based on x-ray. This area is not included on the current CT. Paraspinal and other soft tissues: Right lower lobe masslike density posterior medially measures 3.6 x 2.8 cm. This is incompletely imaged. Small right effusion. Extensive chronic lung disease. 3 mm nonobstructing midpole calculus. 4 cm left upper pole renal cyst. Disc levels: T12-L1: Negative L1-2: Negative L2-3: Negative L3-4: Mild disc bulging and mild facet degeneration. Negative for stenosis L4-5: Mild disc bulging and mild facet degeneration. Negative for stenosis L5-S1: Bilateral facet hypertrophy without significant stenosis. IMPRESSION: 1. Moderate compression fracture of T11 appears acute 2. Left-sided sacral fracture appears acute. Lower sacrum and coccyx not included on this study. Possible fracture in the mid sacrum noted on x-ray. 3. 3 mm nonobstructing left renal calculus 4. Right lower lobe masslike lesion measuring 3.6 x 2.8 cm. Recommend CT chest with contrast for further evaluation. Severe chronic lung disease with fibrosis. Electronically Signed   By: Franchot Gallo M.D.   On: 06/25/2018 09:46   Ct Hip Left Wo Contrast  Result Date: 06/25/2018 CLINICAL DATA:  Left hip pain after fall. EXAM: CT OF THE LEFT HIP WITHOUT CONTRAST TECHNIQUE: Multidetector CT imaging of the left hip was performed according to the standard protocol. Multiplanar CT image reconstructions were also generated. COMPARISON:  Left hip x-rays from same day. FINDINGS: Bones/Joint/Cartilage No acute fracture or dislocation. Mild left hip joint space narrowing with small marginal osteophytes. Osteopenia. No joint effusion. Ligaments Suboptimally assessed by CT. Muscles and Tendons Grossly unremarkable. Soft tissues No soft tissue mass or fluid collection. IMPRESSION: 1.  No acute osseous abnormality. 2. Mild left hip osteoarthritis. Electronically Signed   By: Titus Dubin  M.D.   On: 06/25/2018 09:32   Dg Hip Unilat W Or Wo Pelvis 2-3 Views Left  Result Date: 06/25/2018 CLINICAL DATA:  Initial evaluation for acute back pain and left hip  pain status post recent fall. EXAM: DG HIP (WITH OR WITHOUT PELVIS) 2-3V LEFT COMPARISON:  None. FINDINGS: No acute fracture or dislocation. Femoral heads in normal alignment within the acetabula. Femoral head heights maintained. Bony pelvis intact. Mild osteoarthritic changes present about the hips bilaterally. Osteopenia. No soft tissue abnormality. Degenerative changes noted within the lower lumbar spine. IMPRESSION: No acute osseous abnormality about the left hip. Electronically Signed   By: Jeannine Boga M.D.   On: 06/25/2018 05:39    ASSESSMENT AND PLAN:  *Acute on chronic hypercapnic and hypoxic respiratory failure Improved Patient and the patient's husband not interested in any aggressive intervention Continue BiPAP at bedtime, continue supplemental oxygen wean as tolerated-on 5 L chronically at baseline  *Acute left-sided sacral fracture and T11 fracture Not interested in orthopedic surgery evaluation/invasive procedures/or bracing Schedule Mobic, Neurontin, oxycodone for now and discussion with hospice nurse Physical therapy to see  *Right lower lung mass Patient not interested in any work-up/evaluation, on hospice  *Paroxysmal atrial fibrillation Stable on Cardizem CD and Eliquis  *Hypothyroidism, unspecified  continue levothyroxine  *Chronic respiratory failure with bronchiectasis and COPD Continue oxygen supplementation  *Elevated white blood cell count Continue empiric Rocephin/azithromycin 5-day course Follow-up on cultures   All the records are reviewed and case discussed with Care Management/Social Workerr. Management plans discussed with the patient, family and they are in agreement.  CODE STATUS: DNR/comfort care  TOTAL TIME TAKING CARE OF THIS PATIENT: 45 minutes.     POSSIBLE D/C  IN 1-2 DAYS, DEPENDING ON CLINICAL CONDITION.   Avel Peace Arisbel Maione M.D on 06/26/2018   Between 7am to 6pm - Pager - [phone]  After 6pm go to [email or web address] - password [email or web address] [facility]  Office  [phone]  CC: Primary care physician; Crecencio Mc, MD  Note: This dictation was prepared with Dragon dictation along with smaller phrase technology. Any transcriptional errors that result from this process are unintentional.

## 2018-06-26 NOTE — Progress Notes (Signed)
Rythmol admin instructions dose q 8 hr ordered q 12 hr. Contacted pharmacy whom stated scheduled q 12 at home. Administration instructions listed below med. Automatic.

## 2018-06-26 NOTE — Progress Notes (Signed)
Visit made. Patient is currently followed by [facility] at home with a hospice diagnosis a hospice diagnosis of COPD. She is a DNR code with out of facility DNR in place in the home. She is on chronic oxygen at 5 liters. Patient came to the [facility] ED on 8/18 for evaluation of increased pain. CT of the spine showed compression of T11, left sided sacral fracture, possible mid sacral fracture and right lower lobe mass like lesion. Patient and her husband declined further work up, she was started on IV antibiotics for elevated WBC, PCO2 was also elevated,m BIPAP was recommended, patient declined. She is currently on her baseline of 5 liters via nasal cannula. Palliative Medicine was consulted for symptom management, attending physician has made changes to patient's pain medication to address her fractures. Patient seen lying in bed, alert, husband at bedside, no dyspnea noted, patient reports pain to be controlled. Current pain regime is oxycodone 5 mg q 6 hrs scheduled and mobic 15 mg daily with liquid morphine 5 mg q 2 hrs PRN for pain or dyspnea. Discussed medication changes, both seem in agreement a this time. Per patient PT is to return tomorrow for assessment. Patient sitting up on the side of the bed at end of visit, husband remains at bedside. Will continue to follow and update hospice team.  Flo Shanks RN, BSN, [facility] and [facility] [place], hospital liaison [phone]

## 2018-06-27 MED ORDER — OXYCODONE HCL 5 MG PO TABS: 5.0000 mg | ORAL_TABLET | Freq: Four times a day (QID) | ORAL | 0 refills | Status: DC

## 2018-06-27 MED ORDER — AZITHROMYCIN 250 MG PO TABS: 250.0000 mg | ORAL_TABLET | Freq: Every day | ORAL | 0 refills | Status: AC

## 2018-06-27 MED ORDER — GABAPENTIN 100 MG PO CAPS: 100.0000 mg | ORAL_CAPSULE | Freq: Every day | ORAL | 0 refills | Status: AC

## 2018-06-27 MED ORDER — MELOXICAM 15 MG PO TABS: 15.0000 mg | ORAL_TABLET | Freq: Every day | ORAL | 0 refills | Status: AC

## 2018-06-27 NOTE — Progress Notes (Signed)
Visit made. Patient seen lying in bed, alert and interactive, husband and staff RN Brandi at bedside. Plan is for discharge home via car today, patient in agreement and happy to go home. Discussed having Physical Therapy through hospice, both patient and her husband in agreement. Discussed new medications and reassured Mr. Schleifer that hospice case manager would be notified of new medications. Patient did not report any pain during visit, she continues on scheduled oxycodone 5 mg q 6 with liquid morphine 5 mg q 2 hrs for break through as well as Mobic. Hospice team alerted to discharge, discharge summary faxed to triage. Mr. Sardinas was encouraged to call hospice with any questions/needs. Flo Shanks RN, BSN, [place] and [facility] [place], hospital Liaison [phone]

## 2018-06-27 NOTE — Progress Notes (Signed)
No questions at time of D/C. Pt transported out on home o2. Transporting care home in care of hospice services in the home. Unit phone number provided for any follow up questions that may present

## 2018-06-27 NOTE — Discharge Summary (Signed)
[facility] [place] [facility] [place] NAME: Renny Gunnarson    MR#:  [id number]  DATE OF BIRTH:  01-30-1932  DATE OF ADMISSION:  06/25/2018 ADMITTING PHYSICIAN: Loletha Grayer, MD  DATE OF DISCHARGE: 06/27/2018  2:30 PM  PRIMARY CARE PHYSICIAN: Crecencio Mc, MD    ADMISSION DIAGNOSIS:  Hypoxia [R09.02] Pain of left hip joint [M25.552] Acute bilateral low back pain without sciatica [M54.5]  DISCHARGE DIAGNOSIS:  Active Problems:   Acute on chronic respiratory failure with hypoxia and hypercapnia (HCC)   SECONDARY DIAGNOSIS:   Past Medical History:  Diagnosis Date  . Atrial fibrillation ([place])   . Bacterial infection due to Pseudomonas   . Bronchiectasis   . Chronic respiratory failure with hypoxia and hypercapnia (HCC)   . COPD (chronic obstructive pulmonary disease) ([place])    . Cough    from bronchiectasis neb txs  . Dysrhythmia    A Fib  . Hepatitis    h/o INH(isoniazide)  . History of Mycobacterium avium complex infection    lung disease  . Hypothyroidism   . Pleurisy 12/08/11  . Shortness of breath dyspnea     HOSPITAL COURSE:  *Acute on chronic hypercapnic and hypoxic respiratory failure Resolved Patient and the patient's husband not interested in any aggressive intervention Weaned off BiPAP, continued on 5 L chronically which is patient's baseline Patient to resume home hospice status post discharge  *Acute left-sided sacral fracture and T11 fracture Not interested in orthopedic surgery evaluation/invasive procedures/or bracing Improved pain with Mobic, Neurontin, scheduled oxycodone  *Right lower lung mass Patient not interested in any work-up/evaluation, on hospice  *Paroxysmal atrial fibrillation Stable onCardizem CD and Eliquis  *Hypothyroidism,unspecified  continued levothyroxine  *Chronic respiratory failure with bronchiectasis and COPD Continue oxygen supplementation  *Elevated white blood cell  count Treated with empiric Rocephin/azithromycin while in house, will continue azithromycin status post discharge    DISCHARGE CONDITIONS:   stable  CONSULTS OBTAINED:    DRUG ALLERGIES:   Allergies  Allergen Reactions  . Ciprofloxacin Other (See Comments)    c-diff  . Isoniazid     Hepatitis, weakness, nausea  . Moxifloxacin     Avelox  REACTION: chills, fainting    DISCHARGE MEDICATIONS:   Allergies as of 06/27/2018      Reactions   Ciprofloxacin Other (See Comments)   c-diff   Isoniazid    Hepatitis, weakness, nausea   Moxifloxacin    Avelox  REACTION: chills, fainting      Medication List    TAKE these medications   apixaban 2.5 MG Tabs tablet Commonly known as:  ELIQUIS Take 1 tablet (2.5 mg total) by mouth 2 (two) times daily.   arformoterol 15 MCG/2ML Nebu Commonly known as:  BROVANA Take 15 mcg by nebulization 2 (two) times daily.   azithromycin 250 MG tablet Commonly known as:  ZITHROMAX Take 1 tablet (250 mg total) by mouth daily. Start taking on:  06/28/2018   budesonide 0.5 MG/2ML nebulizer solution Commonly known as:  PULMICORT Take 0.5 mg by nebulization 2 (two) times daily.   [employer] SENNA PLUS 8.6-50 MG tablet Generic drug:  senna-docusate Take 1-2 tablets by mouth daily.   diltiazem 120 MG 24 hr capsule Commonly known as:  CARDIZEM CD Take 1 capsule (120 mg total) by mouth daily.   dorzolamide 2 % ophthalmic solution Commonly known as:  TRUSOPT Place 1 drop into the left eye 2 (two) times daily.   fexofenadine 180 MG tablet Commonly  known as:  ALLEGRA Take 1 tablet (180 mg total) by mouth 2 (two) times daily. Gradual increase   gabapentin 100 MG capsule Commonly known as:  NEURONTIN Take 1 capsule (100 mg total) by mouth at bedtime.   levothyroxine 75 MCG tablet Commonly known as:  SYNTHROID, LEVOTHROID Take 1 tablet (75 mcg total) by mouth daily. What changed:  when to take this   meloxicam 15 MG tablet Commonly known as:   MOBIC Take 1 tablet (15 mg total) by mouth daily.   morphine CONCENTRATE 10 MG/0.5ML Soln concentrated solution Take 0.25 mLs (5 mg total) by mouth every 2 (two) hours as needed for anxiety or shortness of breath.   oxyCODONE 5 MG immediate release tablet Commonly known as:  Oxy IR/ROXICODONE Take 1 tablet (5 mg total) by mouth every 6 (six) hours.   polyethylene glycol packet Commonly known as:  MIRALAX / GLYCOLAX Take 17 g by mouth daily as needed for mild constipation.   propafenone 225 MG tablet Commonly known as:  RYTHMOL Take 1 tablet (225 mg total) by mouth every 12 (twelve) hours.   torsemide 20 MG tablet Commonly known as:  DEMADEX Take 1 tablet (20 mg total) by mouth daily.        DISCHARGE INSTRUCTIONS:   If you experience worsening of your admission symptoms, develop shortness of breath, life threatening emergency, suicidal or homicidal thoughts you must seek medical attention immediately by calling 911 or calling your MD immediately  if symptoms less severe.  You Must read complete instructions/literature along with all the possible adverse reactions/side effects for all the Medicines you take and that have been prescribed to you. Take any new Medicines after you have completely understood and accept all the possible adverse reactions/side effects.   Please note  You were cared for by a hospitalist during your hospital stay. If you have any questions about your discharge medications or the care you received while you were in the hospital after you are discharged, you can call the unit and asked to speak with the hospitalist on call if the hospitalist that took care of you is not available. Once you are discharged, your primary care physician will handle any further medical issues. Please note that NO REFILLS for any discharge medications will be authorized once you are discharged, as it is imperative that you return to your primary care physician (or establish a  relationship with a primary care physician if you do not have one) for your aftercare needs so that they can reassess your need for medications and monitor your lab values.    Today   CHIEF COMPLAINT:   Chief Complaint  Patient presents with  . Back Pain  . Hip Pain    HISTORY OF PRESENT ILLNESS:  84 y.o.femalestate that she is having some memory issues. She had a fall at home a couple days back and has been having some back hip and thigh pain. At 3:30 in the morning she woke up her husband with severe pain. At that time they gave some morphine. Patient still having pain and decided to come back into the ER for further evaluation. She is followed at hospice at home for end-stage lung issues with bronchiectasis. Hospitalist services were contacted for further evaluation. Patient is not the best historian about her pain. Currently states her pain is not too bad. Of note her PCO2 was at 102 on venous blood gas.   VITAL SIGNS:  Blood pressure 102/64, pulse 94, temperature (!) 97.4  F (36.3 C), temperature source Oral, resp. rate 19, height _0  (1.727 m), weight 44.5 kg, SpO2 91 %.  I/O:    Intake/Output Summary (Last 24 hours) at 06/27/2018 1623 Last data filed at 06/27/2018 1035 Gross per 24 hour  Intake 680 ml  Output -  Net 680 ml    PHYSICAL EXAMINATION:  GENERAL:  84 y.o.-year-old patient lying in the bed with no acute distress.  EYES: Pupils equal, round, reactive to light and accommodation. No scleral icterus. Extraocular muscles intact.  HEENT: Head atraumatic, normocephalic. Oropharynx and nasopharynx clear.  NECK:  Supple, no jugular venous distention. No thyroid enlargement, no tenderness.  LUNGS: Normal breath sounds bilaterally, no wheezing, rales,rhonchi or crepitation. No use of accessory muscles of respiration.  CARDIOVASCULAR: S1, S2 normal. No murmurs, rubs, or gallops.  ABDOMEN: Soft, non-tender, non-distended. Bowel sounds present. No organomegaly or  mass.  EXTREMITIES: No pedal edema, cyanosis, or clubbing.  NEUROLOGIC: Cranial nerves II through XII are intact. Muscle strength 5/5 in all extremities. Sensation intact. Gait not checked.  PSYCHIATRIC: The patient is alert and oriented x 3.  SKIN: No obvious rash, lesion, or ulcer.   DATA REVIEW:   CBC Recent Labs  Lab 06/26/18 0456  WBC 14.7*  HGB 11.0*  HCT 33.2*  PLT 255    Chemistries  Recent Labs  Lab 06/26/18 0456  NA 138  K 3.9  CL 92*  CO2 38*  GLUCOSE 97  BUN 24*  CREATININE 0.69  CALCIUM 8.5*    Cardiac Enzymes No results for input(s): TROPONINI in the last 168 hours.  Microbiology Results  Results for orders placed or performed in visit on 06/23/18  Urine Culture     Status: None   Collection Time: 06/23/18  3:23 PM  Result Value Ref Range Status   MICRO NUMBER: [id number]  Final   SPECIMEN QUALITY: ADEQUATE  Final   Sample Source URINE  Final   STATUS: FINAL  Final   Result:   Final    Three or more organisms present, each greater than 10,000 cu/mL. May represent normal flora contamination from external genitalia. No further testing is required.    RADIOLOGY:  No results found.  EKG:   Orders placed or performed in visit on 06/25/18  . EKG 12-Lead  . EKG 12-Lead  . EKG 12-Lead      Management plans discussed with the patient, family and they are in agreement.  CODE STATUS:     Code Status Orders  (From admission, onward)         Start     Ordered   06/25/18 1045  Do not attempt resuscitation (DNR)  Continuous    Question Answer Comment  In the event of cardiac or respiratory ARREST Do not call a "code blue"   In the event of cardiac or respiratory ARREST Do not perform Intubation, CPR, defibrillation or ACLS   In the event of cardiac or respiratory ARREST Use medication by any route, position, wound care, and other measures to relive pain and suffering. May use oxygen, suction and manual treatment of airway obstruction as needed  for comfort.   Comments nurse may pronounce      06/25/18 1044        Code Status History    Date Active Date Inactive Code Status Order ID Comments User Context   06/08/2018 0453 06/12/2018 1701 DNR [id number]  Amelia Jo, MD Inpatient   10/14/2017 1603 06/07/2018 2159 DNR [id number] Discussed at visit Derrel Nip,  Aris Everts, MD Outpatient   04/24/2017 1150 04/26/2017 1701 Full Code [id number]  Nicholes Mango, MD Inpatient    Advance Directive Documentation     Most Recent Value  Type of Advance Directive  Living will, Healthcare Power of Attorney, Out of facility DNR (pink MOST or yellow form)  Pre-existing out of facility DNR order (yellow form or pink MOST form)  -  "MOST" Form in Place?  -      TOTAL TIME TAKING CARE OF THIS PATIENT: 45 minutes.    Avel Peace Salary M.D on 06/27/2018 at 4:23 PM  Between 7am to 6pm - Pager - [phone]  After 6pm go to [email or web address] - password [email or web address] [place] [facility]  Office  [phone]  CC: Primary care physician; Crecencio Mc, MD   Note: This dictation was prepared with Dragon dictation along with smaller phrase technology. Any transcriptional errors that result from this process are unintentional.

## 2018-06-27 NOTE — Discharge Planning (Signed)
[place] at [place] NAME: Hannah Kline    MR#:  [id number]  DATE OF BIRTH:  June 21, 1932  DATE OF ADMISSION:  06/25/2018 ADMITTING PHYSICIAN: Loletha Grayer, MD  DATE OF DISCHARGE: No discharge date for patient encounter.  PRIMARY CARE PHYSICIAN: Crecencio Mc, MD    ADMISSION DIAGNOSIS:  Hypoxia [R09.02] Pain of left hip joint [M25.552] Acute bilateral low back pain without sciatica [M54.5]  DISCHARGE DIAGNOSIS:  Active Problems:   Acute on chronic respiratory failure with hypoxia and hypercapnia (HCC)   SECONDARY DIAGNOSIS:   Past Medical History:  Diagnosis Date  . Atrial fibrillation ([place])   . Bacterial infection due to Pseudomonas   . Bronchiectasis   . Chronic respiratory failure with hypoxia and hypercapnia (HCC)   . COPD (chronic obstructive pulmonary disease) ([place])    . Cough    from bronchiectasis neb txs  . Dysrhythmia    A Fib  . Hepatitis    h/o INH(isoniazide)  . History of Mycobacterium avium complex infection    lung disease  . Hypothyroidism   . Pleurisy 12/08/11  . Shortness of breath dyspnea     HOSPITAL COURSE:  *Acute on chronic hypercapnic and hypoxic respiratory failure Resolved Patient and the patient's husband not interested in any aggressive intervention Weaned off BiPAP, continued on 5 L chronically which is patient's baseline Patient to resume home hospice status post discharge  *Acute left-sided sacral fracture and T11 fracture Not interested in orthopedic surgery evaluation/invasive procedures/or bracing Improved pain with Mobic, Neurontin, scheduled oxycodone  *Right lower lung mass Patient not interested in any work-up/evaluation, on hospice  *Paroxysmal atrial fibrillation Stable on Cardizem CD and Eliquis  *Hypothyroidism, unspecified  continued levothyroxine  *Chronic respiratory failure with bronchiectasis and COPD Continue oxygen supplementation  *Elevated  white blood cell count Treated with empiric Rocephin/azithromycin while in house, will continue azithromycin status post discharge   DISCHARGE CONDITIONS:   stable  CONSULTS OBTAINED:    DRUG ALLERGIES:   Allergies  Allergen Reactions  . Ciprofloxacin Other (See Comments)    c-diff  . Isoniazid     Hepatitis, weakness, nausea  . Moxifloxacin     Avelox  REACTION: chills, fainting    DISCHARGE MEDICATIONS:   Allergies as of 06/27/2018      Reactions   Ciprofloxacin Other (See Comments)   c-diff   Isoniazid    Hepatitis, weakness, nausea   Moxifloxacin    Avelox  REACTION: chills, fainting      Medication List    TAKE these medications   apixaban 2.5 MG Tabs tablet Commonly known as:  ELIQUIS Take 1 tablet (2.5 mg total) by mouth 2 (two) times daily.   arformoterol 15 MCG/2ML Nebu Commonly known as:  BROVANA Take 15 mcg by nebulization 2 (two) times daily.   azithromycin 250 MG tablet Commonly known as:  ZITHROMAX Take 1 tablet (250 mg total) by mouth daily. Start taking on:  06/28/2018   budesonide 0.5 MG/2ML nebulizer solution Commonly known as:  PULMICORT Take 0.5 mg by nebulization 2 (two) times daily.   [employer] SENNA PLUS 8.6-50 MG tablet Generic drug:  senna-docusate Take 1-2 tablets by mouth daily.   diltiazem 120 MG 24 hr capsule Commonly known as:  CARDIZEM CD Take 1 capsule (120 mg total) by mouth daily.   dorzolamide 2 % ophthalmic solution Commonly known as:  TRUSOPT Place 1 drop into the left eye 2 (two) times daily.   fexofenadine 180  MG tablet Commonly known as:  ALLEGRA Take 1 tablet (180 mg total) by mouth 2 (two) times daily. Gradual increase   gabapentin 100 MG capsule Commonly known as:  NEURONTIN Take 1 capsule (100 mg total) by mouth at bedtime.   levothyroxine 75 MCG tablet Commonly known as:  SYNTHROID, LEVOTHROID Take 1 tablet (75 mcg total) by mouth daily. What changed:  when to take this   meloxicam 15 MG  tablet Commonly known as:  MOBIC Take 1 tablet (15 mg total) by mouth daily.   morphine CONCENTRATE 10 MG/0.5ML Soln concentrated solution Take 0.25 mLs (5 mg total) by mouth every 2 (two) hours as needed for anxiety or shortness of breath.   oxyCODONE 5 MG immediate release tablet Commonly known as:  Oxy IR/ROXICODONE Take 1 tablet (5 mg total) by mouth every 6 (six) hours.   polyethylene glycol packet Commonly known as:  MIRALAX / GLYCOLAX Take 17 g by mouth daily as needed for mild constipation.   propafenone 225 MG tablet Commonly known as:  RYTHMOL Take 1 tablet (225 mg total) by mouth every 12 (twelve) hours.   torsemide 20 MG tablet Commonly known as:  DEMADEX Take 1 tablet (20 mg total) by mouth daily.        DISCHARGE INSTRUCTIONS:  If you experience worsening of your admission symptoms, develop shortness of breath, life threatening emergency, suicidal or homicidal thoughts you must seek medical attention immediately by calling 911 or calling your MD immediately  if symptoms less severe.  You Must read complete instructions/literature along with all the possible adverse reactions/side effects for all the Medicines you take and that have been prescribed to you. Take any new Medicines after you have completely understood and accept all the possible adverse reactions/side effects.   Please note  You were cared for by a hospitalist during your hospital stay. If you have any questions about your discharge medications or the care you received while you were in the hospital after you are discharged, you can call the unit and asked to speak with the hospitalist on call if the hospitalist that took care of you is not available. Once you are discharged, your primary care physician will handle any further medical issues. Please note that NO REFILLS for any discharge medications will be authorized once you are discharged, as it is imperative that you return to your primary care  physician (or establish a relationship with a primary care physician if you do not have one) for your aftercare needs so that they can reassess your need for medications and monitor your lab values.    Today   CHIEF COMPLAINT:   Chief Complaint  Patient presents with  . Back Pain  . Hip Pain    HISTORY OF PRESENT ILLNESS:  84 y.o. female state that she is having some memory issues.  She had a fall at home a couple days back and has been having some back hip and thigh pain.  At 3:30 in the morning she woke up her husband with severe pain.  At that time they gave some morphine.  Patient still having pain and decided to come back into the ER for further evaluation.  She is followed at hospice at home for end-stage lung issues with bronchiectasis.  Hospitalist services were contacted for further evaluation.  Patient is not the best historian about her pain.  Currently states her pain is not too bad.  Of note her PCO2 was at 102 on venous blood gas.  VITAL SIGNS:  Blood pressure 102/64, pulse 94, temperature (!) 97.4 F (36.3 C), temperature source Oral, resp. rate 19, height _0  (1.727 m), weight 44.5 kg, SpO2 91 %.  I/O:    Intake/Output Summary (Last 24 hours) at 06/27/2018 1107 Last data filed at 06/27/2018 1035 Gross per 24 hour  Intake 920 ml  Output 1100 ml  Net -180 ml    PHYSICAL EXAMINATION:  GENERAL:  84 y.o.-year-old patient lying in the bed with no acute distress.  EYES: Pupils equal, round, reactive to light and accommodation. No scleral icterus. Extraocular muscles intact.  HEENT: Head atraumatic, normocephalic. Oropharynx and nasopharynx clear.  NECK:  Supple, no jugular venous distention. No thyroid enlargement, no tenderness.  LUNGS: Normal breath sounds bilaterally, no wheezing, rales,rhonchi or crepitation. No use of accessory muscles of respiration.  CARDIOVASCULAR: S1, S2 normal. No murmurs, rubs, or gallops.  ABDOMEN: Soft, non-tender, non-distended. Bowel  sounds present. No organomegaly or mass.  EXTREMITIES: No pedal edema, cyanosis, or clubbing.  NEUROLOGIC: Cranial nerves II through XII are intact. Muscle strength 5/5 in all extremities. Sensation intact. Gait not checked.  PSYCHIATRIC: The patient is alert and oriented x 3.  SKIN: No obvious rash, lesion, or ulcer.   DATA REVIEW:   CBC Recent Labs  Lab 06/26/18 0456  WBC 14.7*  HGB 11.0*  HCT 33.2*  PLT 255    Chemistries  Recent Labs  Lab 06/26/18 0456  NA 138  K 3.9  CL 92*  CO2 38*  GLUCOSE 97  BUN 24*  CREATININE 0.69  CALCIUM 8.5*    Cardiac Enzymes No results for input(s): TROPONINI in the last 168 hours.  Microbiology Results  Results for orders placed or performed in visit on 06/23/18  Urine Culture     Status: None   Collection Time: 06/23/18  3:23 PM  Result Value Ref Range Status   MICRO NUMBER: [id number]  Final   SPECIMEN QUALITY: ADEQUATE  Final   Sample Source URINE  Final   STATUS: FINAL  Final   Result:   Final    Three or more organisms present, each greater than 10,000 cu/mL. May represent normal flora contamination from external genitalia. No further testing is required.    RADIOLOGY:  No results found.  EKG:   Orders placed or performed in visit on 06/25/18  . EKG 12-Lead  . EKG 12-Lead  . EKG 12-Lead      Management plans discussed with the patient, family and they are in agreement.  CODE STATUS:     Code Status Orders  (From admission, onward)         Start     Ordered   06/25/18 1045  Do not attempt resuscitation (DNR)  Continuous    Question Answer Comment  In the event of cardiac or respiratory ARREST Do not call a "code blue"   In the event of cardiac or respiratory ARREST Do not perform Intubation, CPR, defibrillation or ACLS   In the event of cardiac or respiratory ARREST Use medication by any route, position, wound care, and other measures to relive pain and suffering. May use oxygen, suction and manual  treatment of airway obstruction as needed for comfort.   Comments nurse may pronounce      06/25/18 1044        Code Status History    Date Active Date Inactive Code Status Order ID Comments User Context   06/08/2018 0453 06/12/2018 1701 DNR [id number]  Amelia Jo, MD Inpatient  10/14/2017 1603 06/07/2018 2159 DNR [id number] Discussed at visit Crecencio Mc, MD Outpatient   04/24/2017 1150 04/26/2017 1701 Full Code [id number]  Nicholes Mango, MD Inpatient    Advance Directive Documentation     Most Recent Value  Type of Advance Directive  Living will, Healthcare Power of Attorney, Out of facility DNR (pink MOST or yellow form)  Pre-existing out of facility DNR order (yellow form or pink MOST form)  -  "MOST" Form in Place?  -      TOTAL TIME TAKING CARE OF THIS PATIENT: 45 minutes.    Avel Peace Shalaina Guardiola M.D on 06/27/2018 at 11:07 AM  Between 7am to 6pm - Pager - [phone]  After 6pm go to [email or web address] - password [email or web address] [place] [facility]  Office  [phone]  CC: Primary care physician; Crecencio Mc, MD   Note: This dictation was prepared with Dragon dictation along with smaller phrase technology. Any transcriptional errors that result from this process are unintentional.

## 2018-06-27 NOTE — Evaluation (Signed)
Physical Therapy Evaluation Patient Details Name: Hannah Kline MRN: [id number] DOB: Mar 02, 1932 Today's Date: 06/27/2018   History of Present Illness  Pt is an 84 y.o. female presenting to hospital 06/25/18 s/p recent fall with acute back and B hip pain.  Imaging showing acute moderate compression fx T11, L sided sacral fx acute, and possible fx mid sacrum.  Pt admitted with acute on chronic hypercapnic and hypoxic respiratory failure, L sided sacral fx and T 11 fx (per notes conservative management; no brace ordered; no ortho consult since followed by hospice and does not want further treatment), R lower lung mass, and paroxysmal a-fib.  PMH includes a-fib, COPD, hepatitis, CHF, orthostatic hypotension.  Clinical Impression  Prior to hospital admission, pt was most recently ambulating with assist of her husband d/t pain status post fall.  Pt lives at [facility] with her husband.  Currently pt is modified independent supine to/from sit with HOB elevated and CGA to min assist with transfers with RW and walking a few feet to recliner with RW.  Pt reporting minimal pain initially upon PT arrival but then when therapist discussed getting OOB pt then stating she had a lot of pain in R buttock area and didn't think she could do anything.  Pt's husband encouraged pt to try to get OOB to chair and pt then agreeable.  Upon getting to edge of bed, pt suddenly reporting needing to toilet so pt assisted to [facility] but after getting to [facility] pt reporting it was too painful to sit on and couldn't toilet so pt assisted to recliner.  Pt attempted to sit in chair and eat breakfast but pt reporting it was too painful to sit in chair (although pt continuing to eat her breakfast until she was done); trialed donut and repositioning pt in chair but pt reporting it was 9/10 pain R buttock area down to mid posterior R thigh and needed to get back to bed.  Pt assisted back to bed and once pt back to bed pt reporting 1/10 pain in R buttock  area and appearing to fall to sleep.  Pt appearing anxious during session; increased time for activity required d/t this and also d/t pt's pain.  Above discussed with pt's nurse and also Santiago Glad (hospice hospital liaison).   Pt would benefit from skilled PT to address noted impairments and functional limitations (see below for any additional details).  Upon hospital discharge, recommend pt discharge with 24/7 assist (anticipate pt will need to be w/c level with functional mobility d/t pain currently limiting mobility); also recommend physical therapy within home with hospice.    Follow Up Recommendations Supervision/Assistance - 24 hour;Other (comment)(Home PT with hospice)    Equipment Recommendations  Rolling walker with 5" wheels;3in1 (PT);Wheelchair (measurements PT);Wheelchair cushion (measurements PT);Hospital bed    Recommendations for Other Services       Precautions / Restrictions Precautions Precautions: Fall Precaution Comments: T11 compression fx (spinal precautions); sacral fx; O2 5 L (do not go up on O2 per orders) Restrictions Weight Bearing Restrictions: No      Mobility  Bed Mobility Overal bed mobility: Modified Independent             General bed mobility comments: Semi-supine to/from sit with HOB elevated with mild increased effort d/t pain but no assist required  Transfers Overall transfer level: Needs assistance Equipment used: Rolling walker (2 wheeled) Transfers: Sit to/from [facility] Sit to Stand: Min guard;Min assist(x1 trial from bed (min assist); x1 trial from [facility] (  CGA); x1 trial from recliner (CGA)) Stand pivot transfers: Min guard;Min assist       General transfer comment: vc's required for walker use and UE/LE placement with transfers  Ambulation/Gait Ambulation/Gait assistance: Min guard Gait Distance (Feet): 3 Feet(BSC to recliner) Assistive device: Rolling walker (2 wheeled)   Gait velocity: decreased   General Gait  Details: mildly antalgic R LE; limited distance d/t pt reports of pain; vc's for use of walker  Stairs            Wheelchair Mobility    Modified Rankin (Stroke Patients Only)       Balance Overall balance assessment: Needs assistance Sitting-balance support: No upper extremity supported;Feet supported Sitting balance-Leahy Scale: Good Sitting balance - Comments: steady sitting reaching within BOS   Standing balance support: Single extremity supported Standing balance-Leahy Scale: Poor Standing balance comment: pt requires at least single UE support for static standing balance                             Pertinent Vitals/Pain Pain Assessment: 0-10 Pain Score: 1  Pain Location: R lower buttock area Pain Descriptors / Indicators: Sore Pain Intervention(s): Limited activity within patient's tolerance;Monitored during session;Premedicated before session;Repositioned  HR WFL during session O2 sats 91-92% beginning and end of session on 5 L O2 via nasal cannula; O2 decreased to 86% with mobility on 5 L O2 via nasal cannula.    Home Living Family/patient expects to be discharged to:: Private residence Living Arrangements: Spouse/significant other Available Help at Discharge: Family;Available 24 hours/day Type of Home: Independent living facility([facility]) Home Access: Stairs to enter Entrance Stairs-Rails: None Entrance Stairs-Number of Steps: 1 Home Layout: One level Home Equipment: Cane - single point;Transport chair;Shower seat      Prior Function Level of Independence: Needs assistance   Gait / Transfers Assistance Needed: Pt most recently requiring assist from her husband (using hand hold assist plus holding onto furniture prior to hospital admission to ambulate); using SPC prior to that  ADL's / Homemaking Assistance Needed: Assist for bathing, dressing, meals from husband.  Comments: 5 L home O2 chronic; hospice care for bronchiectasis and SOB.      Hand Dominance        Extremity/Trunk Assessment   Upper Extremity Assessment Upper Extremity Assessment: Generalized weakness(fair B hand grip strength; at least 3/5 AROM B elbow flexion/extension and B shoulder flexion AROM to grossly 90 degrees)    Lower Extremity Assessment Lower Extremity Assessment: LLE deficits/detail;RLE deficits/detail RLE Deficits / Details: hip flexion AROM 3/5 (limited d/t pelvic pain), knee flexion/extension at least 3/5 AROM; DF at least 3/5 LLE Deficits / Details: hip flexion AROM 3/5 (limited d/t pelvic pain), knee flexion/extension at least 3/5 AROM; DF at least 3/5    Cervical / Trunk Assessment Cervical / Trunk Assessment: Normal  Communication   Communication: No difficulties  Cognition Arousal/Alertness: Awake/alert Behavior During Therapy: Anxious Overall Cognitive Status: (Oriented x4)                                 General Comments: pt had difficulty maintaining attention to task; appearing anxious with activity and forgetful      General Comments General comments (skin integrity, edema, etc.): Pt resting in bed upon PT arrival.  Nursing cleared pt for participation in physical therapy.  Pt agreeable to PT session with initial encouragement from pt's husband.  Exercises     Assessment/Plan    PT Assessment Patient needs continued PT services  PT Problem List Decreased strength;Decreased activity tolerance;Decreased balance;Decreased mobility;Decreased knowledge of use of DME;Decreased knowledge of precautions;Pain       PT Treatment Interventions DME instruction;Gait training;Stair training;Functional mobility training;Therapeutic activities;Therapeutic exercise;Balance training;Patient/family education    PT Goals (Current goals can be found in the Care Plan section)  Acute Rehab PT Goals Patient Stated Goal: to have less pain and be able to walk PT Goal Formulation: With patient Time For Goal  Achievement: 07/11/18 Potential to Achieve Goals: Fair    Frequency Min 2X/week   Barriers to discharge        Co-evaluation               AM-PAC PT "6 Clicks" Daily Activity  Outcome Measure Difficulty turning over in bed (including adjusting bedclothes, sheets and blankets)?: A Little Difficulty moving from lying on back to sitting on the side of the bed? : A Little Difficulty sitting down on and standing up from a chair with arms (e.g., wheelchair, bedside commode, etc,.)?: Unable Help needed moving to and from a bed to chair (including a wheelchair)?: A Little Help needed walking in hospital room?: A Lot Help needed climbing 3-5 steps with a railing? : Total 6 Click Score: 13    End of Session Equipment Utilized During Treatment: Gait belt;Oxygen(5 L O2 via nasal cannula) Activity Tolerance: Patient limited by pain Patient left: in bed;with call bell/phone within reach;with family/visitor present;Other (comment)(Pt's husband requesting to have bed alarm off while he is present (nurse notified); pt's husband educated to let staff know when he leaves so alarm can be placed) Nurse Communication: Mobility status;Precautions;Other (comment)(Pt's pain status during/end of session) PT Visit Diagnosis: Other abnormalities of gait and mobility (R26.89);Muscle weakness (generalized) (M62.81);History of falling (Z91.81);Difficulty in walking, not elsewhere classified (R26.2);Pain Pain - Right/Left: Right Pain - part of body: Leg    Time: 4287-6811 PT Time Calculation (min) (ACUTE ONLY): 47 min   Charges:   PT Evaluation $PT Eval Low Complexity: 1 Low PT Treatments $Therapeutic Activity: 23-37 mins       Leitha Bleak, PT 06/27/18, 11:55 AM [phone]

## 2018-07-01 LAB — BLOOD GAS, VENOUS
ACID-BASE EXCESS: 16.4 mmol/L — AB (ref 0.0–2.0)
PATIENT TEMPERATURE: 37
PCO2 VEN: 102 mmHg — AB (ref 44.0–60.0)
PH VEN: 7.28 (ref 7.250–7.430)

## 2018-07-04 ENCOUNTER — Telehealth: Payer: Self-pay | Admitting: Pulmonary Disease

## 2018-07-04 ENCOUNTER — Ambulatory Visit: Payer: No Typology Code available for payment source | Admitting: Internal Medicine

## 2018-07-04 NOTE — Telephone Encounter (Signed)
Called and left message for th Hospice nurse to call back and ask for me to verify the medication and pharmacy.

## 2018-07-04 NOTE — Telephone Encounter (Signed)
[facility] is calling back [phone]

## 2018-07-04 NOTE — Telephone Encounter (Signed)
Spoke with Tammy with Hospice. Patient needs refill for Oxycodone 5mg  take one tablet po Q6H. [facility] in [place]. BQ please advise if ok to refill.

## 2018-07-05 ENCOUNTER — Inpatient Hospital Stay: Admitting: Internal Medicine

## 2018-07-05 MED ORDER — OXYCODONE HCL 5 MG PO TABS: 5.0000 mg | ORAL_TABLET | Freq: Four times a day (QID) | ORAL | 0 refills | Status: AC

## 2018-07-05 NOTE — Telephone Encounter (Signed)
OK by me 

## 2018-07-05 NOTE — Telephone Encounter (Signed)
MD McQuaid agreed to script for oxycodone. Script printed and faxed to pharmacy. Nothing further needed at this time.

## 2018-07-06 ENCOUNTER — Ambulatory Visit: Payer: No Typology Code available for payment source | Admitting: Internal Medicine

## 2018-07-07 ENCOUNTER — Ambulatory Visit: Payer: No Typology Code available for payment source | Admitting: Pulmonary Disease

## 2018-07-09 DIAGNOSIS — Z9981 Dependence on supplemental oxygen: Secondary | ICD-10-CM | POA: Diagnosis not present

## 2018-07-09 DIAGNOSIS — I4891 Unspecified atrial fibrillation: Secondary | ICD-10-CM | POA: Diagnosis not present

## 2018-07-09 DIAGNOSIS — R634 Abnormal weight loss: Secondary | ICD-10-CM | POA: Diagnosis not present

## 2018-07-09 DIAGNOSIS — J479 Bronchiectasis, uncomplicated: Secondary | ICD-10-CM | POA: Diagnosis not present

## 2018-07-09 DIAGNOSIS — A312 Disseminated mycobacterium avium-intracellulare complex (DMAC): Secondary | ICD-10-CM | POA: Diagnosis not present

## 2018-07-09 DIAGNOSIS — J9621 Acute and chronic respiratory failure with hypoxia: Secondary | ICD-10-CM | POA: Diagnosis not present

## 2018-07-09 DIAGNOSIS — I272 Pulmonary hypertension, unspecified: Secondary | ICD-10-CM | POA: Diagnosis not present

## 2018-07-09 DIAGNOSIS — E039 Hypothyroidism, unspecified: Secondary | ICD-10-CM | POA: Diagnosis not present

## 2018-07-09 DIAGNOSIS — J449 Chronic obstructive pulmonary disease, unspecified: Secondary | ICD-10-CM | POA: Diagnosis not present

## 2018-07-09 DIAGNOSIS — Z681 Body mass index (BMI) 19 or less, adult: Secondary | ICD-10-CM | POA: Diagnosis not present

## 2018-07-10 DIAGNOSIS — J9621 Acute and chronic respiratory failure with hypoxia: Secondary | ICD-10-CM | POA: Diagnosis not present

## 2018-07-10 DIAGNOSIS — J449 Chronic obstructive pulmonary disease, unspecified: Secondary | ICD-10-CM | POA: Diagnosis not present

## 2018-07-10 DIAGNOSIS — A312 Disseminated mycobacterium avium-intracellulare complex (DMAC): Secondary | ICD-10-CM | POA: Diagnosis not present

## 2018-07-10 DIAGNOSIS — I4891 Unspecified atrial fibrillation: Secondary | ICD-10-CM | POA: Diagnosis not present

## 2018-07-10 DIAGNOSIS — I272 Pulmonary hypertension, unspecified: Secondary | ICD-10-CM | POA: Diagnosis not present

## 2018-07-10 DIAGNOSIS — J479 Bronchiectasis, uncomplicated: Secondary | ICD-10-CM | POA: Diagnosis not present

## 2018-07-11 DIAGNOSIS — J9621 Acute and chronic respiratory failure with hypoxia: Secondary | ICD-10-CM | POA: Diagnosis not present

## 2018-07-11 DIAGNOSIS — J449 Chronic obstructive pulmonary disease, unspecified: Secondary | ICD-10-CM | POA: Diagnosis not present

## 2018-07-11 DIAGNOSIS — A312 Disseminated mycobacterium avium-intracellulare complex (DMAC): Secondary | ICD-10-CM | POA: Diagnosis not present

## 2018-07-11 DIAGNOSIS — I272 Pulmonary hypertension, unspecified: Secondary | ICD-10-CM | POA: Diagnosis not present

## 2018-07-11 DIAGNOSIS — I4891 Unspecified atrial fibrillation: Secondary | ICD-10-CM | POA: Diagnosis not present

## 2018-07-11 DIAGNOSIS — J479 Bronchiectasis, uncomplicated: Secondary | ICD-10-CM | POA: Diagnosis not present

## 2018-07-12 DIAGNOSIS — A312 Disseminated mycobacterium avium-intracellulare complex (DMAC): Secondary | ICD-10-CM | POA: Diagnosis not present

## 2018-07-12 DIAGNOSIS — J9621 Acute and chronic respiratory failure with hypoxia: Secondary | ICD-10-CM | POA: Diagnosis not present

## 2018-07-12 DIAGNOSIS — I4891 Unspecified atrial fibrillation: Secondary | ICD-10-CM | POA: Diagnosis not present

## 2018-07-12 DIAGNOSIS — J449 Chronic obstructive pulmonary disease, unspecified: Secondary | ICD-10-CM | POA: Diagnosis not present

## 2018-07-12 DIAGNOSIS — J479 Bronchiectasis, uncomplicated: Secondary | ICD-10-CM | POA: Diagnosis not present

## 2018-07-12 DIAGNOSIS — I272 Pulmonary hypertension, unspecified: Secondary | ICD-10-CM | POA: Diagnosis not present

## 2018-07-13 ENCOUNTER — Telehealth: Payer: Self-pay

## 2018-07-13 ENCOUNTER — Telehealth: Payer: Self-pay | Admitting: Pulmonary Disease

## 2018-07-13 ENCOUNTER — Ambulatory Visit: Admitting: Cardiovascular Disease

## 2018-07-13 DIAGNOSIS — A312 Disseminated mycobacterium avium-intracellulare complex (DMAC): Secondary | ICD-10-CM | POA: Diagnosis not present

## 2018-07-13 DIAGNOSIS — J479 Bronchiectasis, uncomplicated: Secondary | ICD-10-CM | POA: Diagnosis not present

## 2018-07-13 DIAGNOSIS — J9621 Acute and chronic respiratory failure with hypoxia: Secondary | ICD-10-CM | POA: Diagnosis not present

## 2018-07-13 DIAGNOSIS — J449 Chronic obstructive pulmonary disease, unspecified: Secondary | ICD-10-CM | POA: Diagnosis not present

## 2018-07-13 DIAGNOSIS — I4891 Unspecified atrial fibrillation: Secondary | ICD-10-CM | POA: Diagnosis not present

## 2018-07-13 DIAGNOSIS — I272 Pulmonary hypertension, unspecified: Secondary | ICD-10-CM | POA: Diagnosis not present

## 2018-07-14 ENCOUNTER — Telehealth: Payer: Self-pay | Admitting: Pulmonary Disease

## 2018-07-14 NOTE — Telephone Encounter (Signed)
Received fax from [facility].  Patient died on 08-08-18.

## 2018-07-17 NOTE — Telephone Encounter (Signed)
Sorry to hear this.  Thanks for update

## 2018-07-19 NOTE — Telephone Encounter (Signed)
Called medical records and spoke with Sharyn Lull to see if a death certificate was received.  Per Sharyn Lull, since pt was under the care of hospice, the hospice MD signed the death certificate per a note she had seen.  Sharyn Lull states she has not yet received a copy of the death certificate.  Called [facility] and spoke with Crystal to see if the hospice doctor had signed the death certificate and per Crystal, pt's attending (Dr. Lake Bells) is the one who was to sign it.  I stated to Crystal that Sharyn Lull over medical records who handles the death certificates has not seen one on pt.  Crystal told me to call the funeral home, [employer] at [phone] to see if they had sent the certificate in the mail or if they had already had it taken care of.  Called [employer] and spoke with Milta Deiters to see if they had pt's death certificate had been taken care of. Per Milta Deiters, the original physician had refused to sign the certificate and after Milta Deiters spoke with his wife, they had everything taken care of as the hospice doctor had agreed to sign the certificate. Nothing further needed.

## 2018-08-08 NOTE — Telephone Encounter (Signed)
YES,  I WILL.  Hannah Kline will be picking up a card today to send to Hannah Kline

## 2018-08-08 NOTE — Telephone Encounter (Signed)
Copied from [place] [id number]. Topic: Inquiry >> 2018/07/19  8:48 AM [name] wrote: Reason for CRM: [facility] has called b/c the pt has passed away, notifying the pcp and if pcp can sign the death certificate [phone]

## 2018-08-08 NOTE — Telephone Encounter (Signed)
Copied from [place] [id number]. Topic: Inquiry >> 07/30/2018  8:48 AM [name] wrote: Reason for CRM: [facility] has called b/c the pt has passed away, notifying the pcp and if pcp can sign the death certificate [phone] >> 07/30/2018 10:52 AM [name] wrote: Pt is under hospice care and will have death certificate signed by them

## 2018-08-08 NOTE — Telephone Encounter (Signed)
Hospice MD signed the death certificate for patient Hannah Kline did not realize patient ws hospice. FYI

## 2018-08-08 NOTE — Telephone Encounter (Signed)
Tammy RN with Hospice calling to let Dr Lake Bells know that patient Hannah Kline passed away this morning @ 0849. Death certificate will need to be signed by BQ

## 2018-08-08 DEATH — deceased

## 2018-08-16 ENCOUNTER — Ambulatory Visit: Payer: No Typology Code available for payment source | Admitting: Internal Medicine

## 2018-08-31 IMAGING — DX DG CHEST 2V
2 series · 2 of 2 positions shown · non-contrast
Comparison: Portable chest x-ray of 04/24/2017, and CT chest of
02/17/2017

CLINICAL DATA: Severe shortness of breath for 4 days, history of
bronchiectasis and mycobacterium avium

EXAM:
CHEST  2 VIEW

[chest pa]
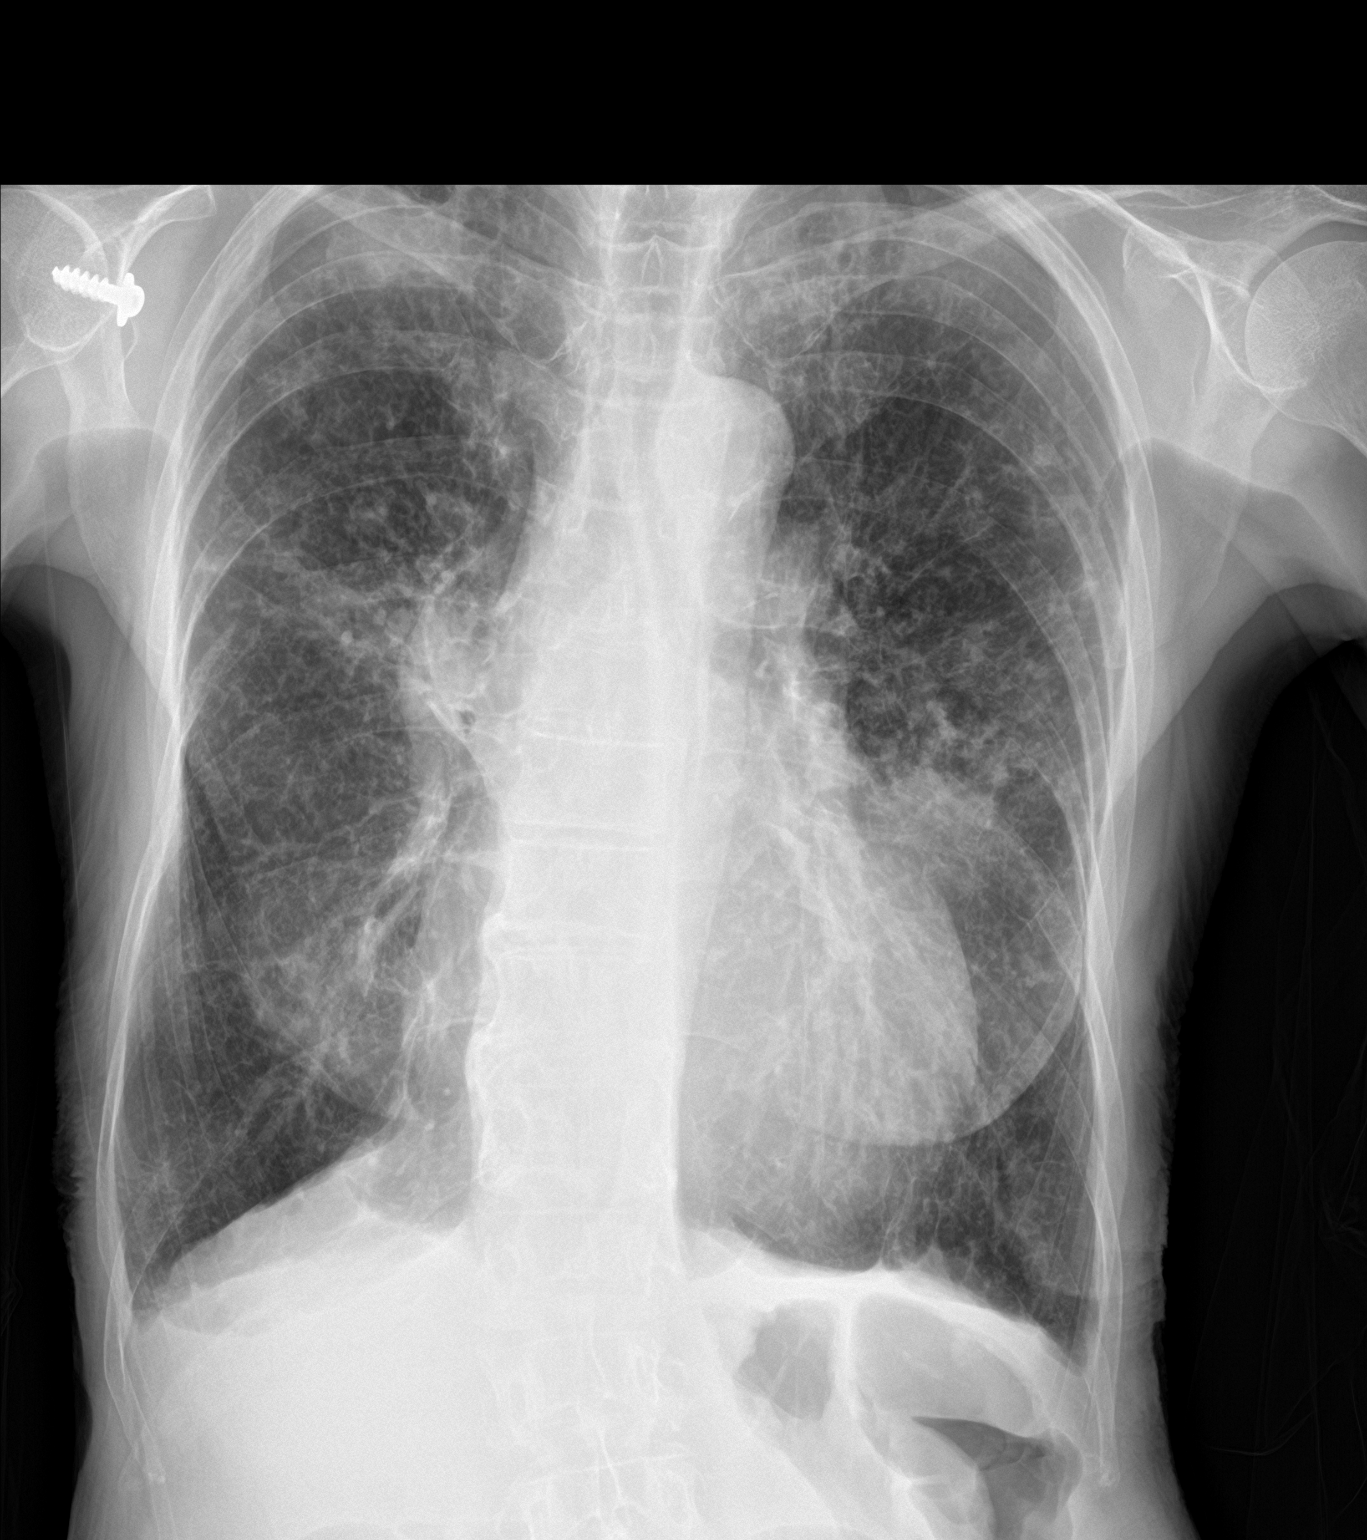

[chest lat]
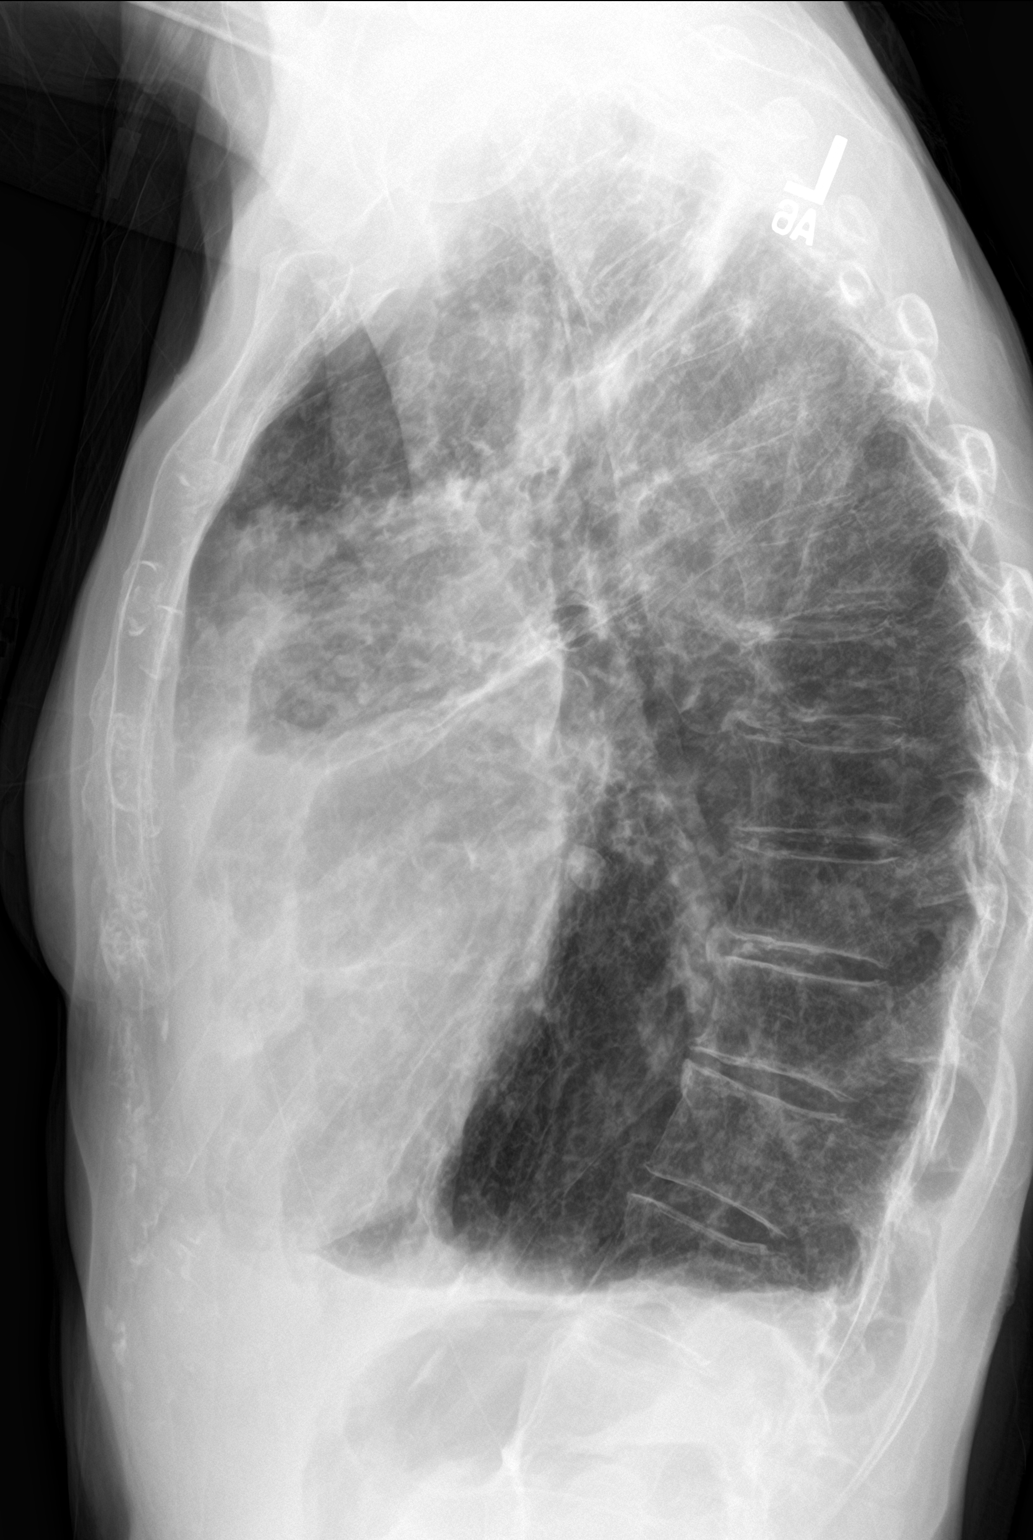

[2 of 2 positions shown; findings below may reference images not displayed]

FINDINGS: Chronic changes again noted are throughout the lungs consistent
scarring and bronchiectatic change in this patient with history of
mycobacterium avium complex. A rounded opacity overlying the left
upper heart border may well represent the scarring and
bronchiectatic change in the lingula on prior CT of the chest. No
pleural effusion is seen. Mediastinal and hilar contours are
unremarkable. The heart is mildly enlarged and stable. No acute bony
abnormality is seen.
IMPRESSION: Stable chronic pleuroparenchymal changes from prior mycobacterium
avium complex. No definite active process.

## 2019-02-02 ENCOUNTER — Ambulatory Visit: Payer: Self-pay
